# Patient Record
Sex: Female | Born: 1985 | Race: Black or African American | Hispanic: No | Marital: Single | Smoking: Former smoker
Health system: Southern US, Community
[De-identification: ages and names within clinical notes are randomized; demographics above are authoritative.]

## PROBLEM LIST (undated history)

## (undated) DIAGNOSIS — R51 Headache: Secondary | ICD-10-CM

## (undated) DIAGNOSIS — K8301 Primary sclerosing cholangitis: Secondary | ICD-10-CM

## (undated) DIAGNOSIS — D696 Thrombocytopenia, unspecified: Secondary | ICD-10-CM

## (undated) DIAGNOSIS — R569 Unspecified convulsions: Secondary | ICD-10-CM

## (undated) DIAGNOSIS — I639 Cerebral infarction, unspecified: Secondary | ICD-10-CM

## (undated) DIAGNOSIS — K7460 Unspecified cirrhosis of liver: Secondary | ICD-10-CM

## (undated) DIAGNOSIS — F329 Major depressive disorder, single episode, unspecified: Secondary | ICD-10-CM

## (undated) DIAGNOSIS — R011 Cardiac murmur, unspecified: Secondary | ICD-10-CM

## (undated) DIAGNOSIS — I8500 Esophageal varices without bleeding: Secondary | ICD-10-CM

## (undated) DIAGNOSIS — F1910 Other psychoactive substance abuse, uncomplicated: Secondary | ICD-10-CM

## (undated) DIAGNOSIS — Z9289 Personal history of other medical treatment: Secondary | ICD-10-CM

## (undated) DIAGNOSIS — K754 Autoimmune hepatitis: Secondary | ICD-10-CM

## (undated) DIAGNOSIS — F32A Depression, unspecified: Secondary | ICD-10-CM

## (undated) DIAGNOSIS — I629 Nontraumatic intracranial hemorrhage, unspecified: Secondary | ICD-10-CM

## (undated) DIAGNOSIS — D649 Anemia, unspecified: Secondary | ICD-10-CM

## (undated) DIAGNOSIS — F419 Anxiety disorder, unspecified: Secondary | ICD-10-CM

## (undated) HISTORY — DX: Other psychoactive substance abuse, uncomplicated: F19.10

## (undated) HISTORY — DX: Esophageal varices without bleeding: I85.00

## (undated) HISTORY — DX: Autoimmune hepatitis: K75.4

## (undated) SURGERY — EGD (ESOPHAGOGASTRODUODENOSCOPY)
Anesthesia: Moderate Sedation

---
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
## 2006-01-25 ENCOUNTER — Ambulatory Visit: Payer: Self-pay | Admitting: Sports Medicine

## 2006-03-30 ENCOUNTER — Ambulatory Visit: Payer: Self-pay | Admitting: Family Medicine

## 2006-07-04 ENCOUNTER — Emergency Department (HOSPITAL_COMMUNITY): Admission: EM | Admit: 2006-07-04 | Discharge: 2006-07-04 | Payer: Self-pay | Admitting: Emergency Medicine

## 2006-09-27 ENCOUNTER — Emergency Department (HOSPITAL_COMMUNITY): Admission: EM | Admit: 2006-09-27 | Discharge: 2006-09-27 | Payer: Self-pay | Admitting: Emergency Medicine

## 2006-10-06 DIAGNOSIS — D696 Thrombocytopenia, unspecified: Secondary | ICD-10-CM

## 2006-10-06 DIAGNOSIS — IMO0002 Reserved for concepts with insufficient information to code with codable children: Secondary | ICD-10-CM | POA: Insufficient documentation

## 2006-10-06 DIAGNOSIS — K7290 Hepatic failure, unspecified without coma: Secondary | ICD-10-CM

## 2006-10-06 DIAGNOSIS — R8789 Other abnormal findings in specimens from female genital organs: Secondary | ICD-10-CM

## 2006-10-06 DIAGNOSIS — F17200 Nicotine dependence, unspecified, uncomplicated: Secondary | ICD-10-CM | POA: Insufficient documentation

## 2006-10-06 DIAGNOSIS — K7460 Unspecified cirrhosis of liver: Secondary | ICD-10-CM | POA: Insufficient documentation

## 2006-10-06 DIAGNOSIS — I8500 Esophageal varices without bleeding: Secondary | ICD-10-CM | POA: Insufficient documentation

## 2006-10-06 DIAGNOSIS — K759 Inflammatory liver disease, unspecified: Secondary | ICD-10-CM | POA: Insufficient documentation

## 2006-10-24 ENCOUNTER — Encounter: Payer: Self-pay | Admitting: Physician Assistant

## 2007-02-06 ENCOUNTER — Encounter (INDEPENDENT_AMBULATORY_CARE_PROVIDER_SITE_OTHER): Payer: Self-pay | Admitting: Family Medicine

## 2007-03-25 ENCOUNTER — Emergency Department (HOSPITAL_COMMUNITY): Admission: EM | Admit: 2007-03-25 | Discharge: 2007-03-25 | Payer: Self-pay | Admitting: Emergency Medicine

## 2007-05-01 ENCOUNTER — Encounter (INDEPENDENT_AMBULATORY_CARE_PROVIDER_SITE_OTHER): Payer: Self-pay | Admitting: Family Medicine

## 2007-06-29 ENCOUNTER — Ambulatory Visit: Payer: Self-pay | Admitting: Family Medicine

## 2007-06-29 ENCOUNTER — Encounter (INDEPENDENT_AMBULATORY_CARE_PROVIDER_SITE_OTHER): Payer: Self-pay | Admitting: *Deleted

## 2007-06-29 DIAGNOSIS — R300 Dysuria: Secondary | ICD-10-CM

## 2007-06-29 DIAGNOSIS — N898 Other specified noninflammatory disorders of vagina: Secondary | ICD-10-CM | POA: Insufficient documentation

## 2007-06-29 DIAGNOSIS — N912 Amenorrhea, unspecified: Secondary | ICD-10-CM | POA: Insufficient documentation

## 2007-06-29 LAB — CONVERTED CEMR LAB
Blood in Urine, dipstick: NEGATIVE
Ketones, urine, test strip: NEGATIVE
Nitrite: NEGATIVE
Specific Gravity, Urine: 1.015
Whiff Test: NEGATIVE

## 2007-07-04 ENCOUNTER — Encounter: Payer: Self-pay | Admitting: *Deleted

## 2007-08-10 DIAGNOSIS — I639 Cerebral infarction, unspecified: Secondary | ICD-10-CM

## 2007-08-10 DIAGNOSIS — I629 Nontraumatic intracranial hemorrhage, unspecified: Secondary | ICD-10-CM

## 2007-08-10 HISTORY — DX: Nontraumatic intracranial hemorrhage, unspecified: I62.9

## 2007-08-10 HISTORY — DX: Cerebral infarction, unspecified: I63.9

## 2007-08-27 ENCOUNTER — Encounter (INDEPENDENT_AMBULATORY_CARE_PROVIDER_SITE_OTHER): Payer: Self-pay | Admitting: Family Medicine

## 2007-08-29 ENCOUNTER — Encounter (INDEPENDENT_AMBULATORY_CARE_PROVIDER_SITE_OTHER): Payer: Self-pay | Admitting: Family Medicine

## 2007-09-04 ENCOUNTER — Encounter: Payer: Self-pay | Admitting: Emergency Medicine

## 2007-09-05 ENCOUNTER — Ambulatory Visit: Payer: Self-pay | Admitting: Oncology

## 2007-09-05 ENCOUNTER — Inpatient Hospital Stay (HOSPITAL_COMMUNITY): Admission: EM | Admit: 2007-09-05 | Discharge: 2007-09-06 | Payer: Self-pay | Admitting: Neurosurgery

## 2007-09-06 ENCOUNTER — Encounter (INDEPENDENT_AMBULATORY_CARE_PROVIDER_SITE_OTHER): Payer: Self-pay | Admitting: Family Medicine

## 2007-09-07 ENCOUNTER — Encounter (INDEPENDENT_AMBULATORY_CARE_PROVIDER_SITE_OTHER): Payer: Self-pay | Admitting: Family Medicine

## 2007-09-08 ENCOUNTER — Encounter (INDEPENDENT_AMBULATORY_CARE_PROVIDER_SITE_OTHER): Payer: Self-pay | Admitting: Family Medicine

## 2007-09-09 ENCOUNTER — Encounter (INDEPENDENT_AMBULATORY_CARE_PROVIDER_SITE_OTHER): Payer: Self-pay | Admitting: Family Medicine

## 2007-09-10 ENCOUNTER — Encounter (INDEPENDENT_AMBULATORY_CARE_PROVIDER_SITE_OTHER): Payer: Self-pay | Admitting: Family Medicine

## 2007-09-12 ENCOUNTER — Encounter (INDEPENDENT_AMBULATORY_CARE_PROVIDER_SITE_OTHER): Payer: Self-pay | Admitting: Family Medicine

## 2007-09-12 ENCOUNTER — Telehealth (INDEPENDENT_AMBULATORY_CARE_PROVIDER_SITE_OTHER): Payer: Self-pay | Admitting: Family Medicine

## 2007-09-13 ENCOUNTER — Encounter (INDEPENDENT_AMBULATORY_CARE_PROVIDER_SITE_OTHER): Payer: Self-pay | Admitting: Family Medicine

## 2007-09-14 ENCOUNTER — Encounter (INDEPENDENT_AMBULATORY_CARE_PROVIDER_SITE_OTHER): Payer: Self-pay | Admitting: Family Medicine

## 2007-09-15 ENCOUNTER — Encounter (INDEPENDENT_AMBULATORY_CARE_PROVIDER_SITE_OTHER): Payer: Self-pay | Admitting: Family Medicine

## 2007-09-16 ENCOUNTER — Encounter (INDEPENDENT_AMBULATORY_CARE_PROVIDER_SITE_OTHER): Payer: Self-pay | Admitting: Family Medicine

## 2007-09-17 ENCOUNTER — Encounter (INDEPENDENT_AMBULATORY_CARE_PROVIDER_SITE_OTHER): Payer: Self-pay | Admitting: Family Medicine

## 2007-09-18 ENCOUNTER — Encounter (INDEPENDENT_AMBULATORY_CARE_PROVIDER_SITE_OTHER): Payer: Self-pay | Admitting: Family Medicine

## 2007-09-21 ENCOUNTER — Encounter (INDEPENDENT_AMBULATORY_CARE_PROVIDER_SITE_OTHER): Payer: Self-pay | Admitting: Family Medicine

## 2007-09-22 ENCOUNTER — Encounter (INDEPENDENT_AMBULATORY_CARE_PROVIDER_SITE_OTHER): Payer: Self-pay | Admitting: Family Medicine

## 2007-09-22 ENCOUNTER — Ambulatory Visit: Payer: Self-pay | Admitting: Oncology

## 2007-09-22 ENCOUNTER — Encounter: Payer: Self-pay | Admitting: Physician Assistant

## 2007-09-25 ENCOUNTER — Encounter (HOSPITAL_COMMUNITY): Admission: RE | Admit: 2007-09-25 | Discharge: 2007-12-24 | Payer: Self-pay | Admitting: Oncology

## 2007-09-25 LAB — CBC WITH DIFFERENTIAL/PLATELET
BASO%: 0.4 % (ref 0.0–2.0)
EOS%: 0.6 % (ref 0.0–7.0)
HCT: 34.4 % — ABNORMAL LOW (ref 34.8–46.6)
LYMPH%: 16.3 % (ref 14.0–48.0)
MCH: 36 pg — ABNORMAL HIGH (ref 26.0–34.0)
MCHC: 35.8 g/dL (ref 32.0–36.0)
NEUT%: 72.8 % (ref 39.6–76.8)
RBC: 3.42 10*6/uL — ABNORMAL LOW (ref 3.70–5.32)
WBC: 2.4 10*3/uL — ABNORMAL LOW (ref 3.9–10.0)
lymph#: 0.4 10*3/uL — ABNORMAL LOW (ref 0.9–3.3)

## 2007-09-25 LAB — COMPREHENSIVE METABOLIC PANEL
ALT: 123 U/L — ABNORMAL HIGH (ref 0–35)
AST: 167 U/L — ABNORMAL HIGH (ref 0–37)
Alkaline Phosphatase: 407 U/L — ABNORMAL HIGH (ref 39–117)
BUN: 12 mg/dL (ref 6–23)
Creatinine, Ser: 0.46 mg/dL (ref 0.40–1.20)

## 2007-09-25 LAB — HOLD TUBE, BLOOD BANK

## 2007-09-25 LAB — APTT: aPTT: 40 seconds — ABNORMAL HIGH (ref 24–37)

## 2007-09-27 ENCOUNTER — Encounter: Admission: RE | Admit: 2007-09-27 | Discharge: 2007-12-26 | Payer: Self-pay | Admitting: Student

## 2007-09-28 LAB — CBC WITH DIFFERENTIAL/PLATELET
BASO%: 0.9 % (ref 0.0–2.0)
Eosinophils Absolute: 0 10*3/uL (ref 0.0–0.5)
LYMPH%: 17.5 % (ref 14.0–48.0)
MCH: 36 pg — ABNORMAL HIGH (ref 26.0–34.0)
MCHC: 36 g/dL (ref 32.0–36.0)
MCV: 100 fL (ref 81.0–101.0)
MONO%: 12 % (ref 0.0–13.0)
Platelets: 16 10*3/uL — ABNORMAL LOW (ref 145–400)
RBC: 3.16 10*6/uL — ABNORMAL LOW (ref 3.70–5.32)

## 2007-10-05 LAB — CBC WITH DIFFERENTIAL/PLATELET
BASO%: 0.5 % (ref 0.0–2.0)
LYMPH%: 27.5 % (ref 14.0–48.0)
MCHC: 35.5 g/dL (ref 32.0–36.0)
MONO#: 0.2 10*3/uL (ref 0.1–0.9)
Platelets: 28 10*3/uL — ABNORMAL LOW (ref 145–400)
RBC: 3.51 10*6/uL — ABNORMAL LOW (ref 3.70–5.32)
WBC: 1.9 10*3/uL — ABNORMAL LOW (ref 3.9–10.0)

## 2007-10-12 LAB — CBC WITH DIFFERENTIAL/PLATELET
Basophils Absolute: 0 10*3/uL (ref 0.0–0.1)
Eosinophils Absolute: 0 10*3/uL (ref 0.0–0.5)
HCT: 32 % — ABNORMAL LOW (ref 34.8–46.6)
HGB: 11.5 g/dL — ABNORMAL LOW (ref 11.6–15.9)
LYMPH%: 28.4 % (ref 14.0–48.0)
MONO#: 0.2 10*3/uL (ref 0.1–0.9)
NEUT#: 1.2 10*3/uL — ABNORMAL LOW (ref 1.5–6.5)
NEUT%: 60.1 % (ref 39.6–76.8)
Platelets: 30 10*3/uL — ABNORMAL LOW (ref 145–400)
WBC: 2 10*3/uL — ABNORMAL LOW (ref 3.9–10.0)

## 2007-10-13 ENCOUNTER — Emergency Department (HOSPITAL_COMMUNITY): Admission: EM | Admit: 2007-10-13 | Discharge: 2007-10-13 | Payer: Self-pay | Admitting: Emergency Medicine

## 2007-10-19 LAB — CBC WITH DIFFERENTIAL/PLATELET
BASO%: 0.3 % (ref 0.0–2.0)
EOS%: 1.6 % (ref 0.0–7.0)
HCT: 33 % — ABNORMAL LOW (ref 34.8–46.6)
LYMPH%: 23.4 % (ref 14.0–48.0)
MCH: 36.5 pg — ABNORMAL HIGH (ref 26.0–34.0)
MCHC: 36.1 g/dL — ABNORMAL HIGH (ref 32.0–36.0)
MONO#: 0.2 10*3/uL (ref 0.1–0.9)
MONO%: 7.7 % (ref 0.0–13.0)
NEUT%: 67 % (ref 39.6–76.8)
Platelets: 37 10*3/uL — ABNORMAL LOW (ref 145–400)
RBC: 3.27 10*6/uL — ABNORMAL LOW (ref 3.70–5.32)
WBC: 2.4 10*3/uL — ABNORMAL LOW (ref 3.9–10.0)

## 2007-10-19 LAB — COMPREHENSIVE METABOLIC PANEL
ALT: 88 U/L — ABNORMAL HIGH (ref 0–35)
AST: 136 U/L — ABNORMAL HIGH (ref 0–37)
Alkaline Phosphatase: 403 U/L — ABNORMAL HIGH (ref 39–117)
CO2: 25 mEq/L (ref 19–32)
Creatinine, Ser: 0.45 mg/dL (ref 0.40–1.20)
Sodium: 138 mEq/L (ref 135–145)
Total Bilirubin: 4.3 mg/dL — ABNORMAL HIGH (ref 0.3–1.2)
Total Protein: 6.4 g/dL (ref 6.0–8.3)

## 2007-10-24 ENCOUNTER — Encounter: Payer: Self-pay | Admitting: Gastroenterology

## 2007-10-26 LAB — CBC WITH DIFFERENTIAL/PLATELET
BASO%: 0.5 % (ref 0.0–2.0)
HCT: 34.1 % — ABNORMAL LOW (ref 34.8–46.6)
LYMPH%: 30 % (ref 14.0–48.0)
MCH: 36.6 pg — ABNORMAL HIGH (ref 26.0–34.0)
MCHC: 35.8 g/dL (ref 32.0–36.0)
MCV: 102.2 fL — ABNORMAL HIGH (ref 81.0–101.0)
MONO#: 0.1 10*3/uL (ref 0.1–0.9)
MONO%: 6.7 % (ref 0.0–13.0)
NEUT%: 60.9 % (ref 39.6–76.8)
Platelets: 43 10*3/uL — ABNORMAL LOW (ref 145–400)
RBC: 3.33 10*6/uL — ABNORMAL LOW (ref 3.70–5.32)
WBC: 2.2 10*3/uL — ABNORMAL LOW (ref 3.9–10.0)

## 2007-11-07 ENCOUNTER — Ambulatory Visit: Payer: Self-pay | Admitting: Oncology

## 2007-11-16 LAB — COMPREHENSIVE METABOLIC PANEL
ALT: 61 U/L — ABNORMAL HIGH (ref 0–35)
AST: 96 U/L — ABNORMAL HIGH (ref 0–37)
Albumin: 3 g/dL — ABNORMAL LOW (ref 3.5–5.2)
Alkaline Phosphatase: 534 U/L — ABNORMAL HIGH (ref 39–117)
BUN: 14 mg/dL (ref 6–23)
CO2: 23 mEq/L (ref 19–32)
Calcium: 8.1 mg/dL — ABNORMAL LOW (ref 8.4–10.5)
Chloride: 110 mEq/L (ref 96–112)
Creatinine, Ser: 0.51 mg/dL (ref 0.40–1.20)
Glucose, Bld: 86 mg/dL (ref 70–99)
Potassium: 4.2 mEq/L (ref 3.5–5.3)
Sodium: 140 mEq/L (ref 135–145)
Total Bilirubin: 3.1 mg/dL — ABNORMAL HIGH (ref 0.3–1.2)
Total Protein: 6.4 g/dL (ref 6.0–8.3)

## 2007-11-16 LAB — CBC WITH DIFFERENTIAL/PLATELET
Basophils Absolute: 0 10*3/uL (ref 0.0–0.1)
EOS%: 5 % (ref 0.0–7.0)
HGB: 12.1 g/dL (ref 11.6–15.9)
LYMPH%: 27.9 % (ref 14.0–48.0)
MCH: 36.5 pg — ABNORMAL HIGH (ref 26.0–34.0)
MCV: 108 fL — ABNORMAL HIGH (ref 81.0–101.0)
MONO%: 8.3 % (ref 0.0–13.0)
NEUT%: 57.2 % (ref 39.6–76.8)
Platelets: 47 10*3/uL — ABNORMAL LOW (ref 145–400)
RDW: 15.1 % — ABNORMAL HIGH (ref 11.3–14.5)

## 2007-11-23 LAB — CBC WITH DIFFERENTIAL/PLATELET
Eosinophils Absolute: 0.1 10*3/uL (ref 0.0–0.5)
HCT: 35.5 % (ref 34.8–46.6)
HGB: 12.2 g/dL (ref 11.6–15.9)
LYMPH%: 22 % (ref 14.0–48.0)
MONO#: 0.3 10*3/uL (ref 0.1–0.9)
NEUT#: 1.8 10*3/uL (ref 1.5–6.5)
NEUT%: 63.8 % (ref 39.6–76.8)
Platelets: 42 10*3/uL — ABNORMAL LOW (ref 145–400)
WBC: 2.8 10*3/uL — ABNORMAL LOW (ref 3.9–10.0)

## 2007-11-30 LAB — CBC WITH DIFFERENTIAL/PLATELET
BASO%: 1.4 % (ref 0.0–2.0)
HCT: 40.5 % (ref 34.8–46.6)
LYMPH%: 23.2 % (ref 14.0–48.0)
MCH: 35.8 pg — ABNORMAL HIGH (ref 26.0–34.0)
MCHC: 34 g/dL (ref 32.0–36.0)
MONO#: 0.2 10*3/uL (ref 0.1–0.9)
NEUT%: 63.5 % (ref 39.6–76.8)
Platelets: 38 10*3/uL — ABNORMAL LOW (ref 145–400)
WBC: 2.3 10*3/uL — ABNORMAL LOW (ref 3.9–10.0)

## 2007-11-30 LAB — COMPREHENSIVE METABOLIC PANEL
ALT: 52 U/L — ABNORMAL HIGH (ref 0–35)
BUN: 12 mg/dL (ref 6–23)
CO2: 21 mEq/L (ref 19–32)
Creatinine, Ser: 0.53 mg/dL (ref 0.40–1.20)
Total Bilirubin: 3.5 mg/dL — ABNORMAL HIGH (ref 0.3–1.2)

## 2007-12-05 ENCOUNTER — Ambulatory Visit: Payer: Self-pay | Admitting: Family Medicine

## 2007-12-05 DIAGNOSIS — L738 Other specified follicular disorders: Secondary | ICD-10-CM | POA: Insufficient documentation

## 2007-12-05 DIAGNOSIS — I629 Nontraumatic intracranial hemorrhage, unspecified: Secondary | ICD-10-CM | POA: Insufficient documentation

## 2007-12-05 LAB — CONVERTED CEMR LAB: Beta hcg, urine, semiquantitative: NEGATIVE

## 2007-12-26 ENCOUNTER — Ambulatory Visit: Payer: Self-pay | Admitting: Oncology

## 2008-01-03 ENCOUNTER — Encounter (INDEPENDENT_AMBULATORY_CARE_PROVIDER_SITE_OTHER): Payer: Self-pay | Admitting: Family Medicine

## 2008-02-16 ENCOUNTER — Ambulatory Visit: Payer: Self-pay | Admitting: Oncology

## 2008-02-25 ENCOUNTER — Inpatient Hospital Stay (HOSPITAL_COMMUNITY): Admission: AC | Admit: 2008-02-25 | Discharge: 2008-02-28 | Payer: Self-pay

## 2008-02-25 ENCOUNTER — Ambulatory Visit: Payer: Self-pay | Admitting: Oncology

## 2008-02-25 ENCOUNTER — Ambulatory Visit: Payer: Self-pay | Admitting: Pulmonary Disease

## 2008-03-05 LAB — CBC WITH DIFFERENTIAL/PLATELET
Basophils Absolute: 0 10*3/uL (ref 0.0–0.1)
EOS%: 2.1 % (ref 0.0–7.0)
Eosinophils Absolute: 0.1 10*3/uL (ref 0.0–0.5)
HCT: 37.4 % (ref 34.8–46.6)
HGB: 13.3 g/dL (ref 11.6–15.9)
MCH: 36.6 pg — ABNORMAL HIGH (ref 26.0–34.0)
MCV: 103.2 fL — ABNORMAL HIGH (ref 81.0–101.0)
MONO%: 10.9 % (ref 0.0–13.0)
NEUT%: 68.6 % (ref 39.6–76.8)
lymph#: 0.5 10*3/uL — ABNORMAL LOW (ref 0.9–3.3)

## 2008-03-06 ENCOUNTER — Telehealth (INDEPENDENT_AMBULATORY_CARE_PROVIDER_SITE_OTHER): Payer: Self-pay | Admitting: Family Medicine

## 2008-03-29 ENCOUNTER — Ambulatory Visit: Payer: Self-pay | Admitting: Oncology

## 2008-04-02 LAB — CBC WITH DIFFERENTIAL/PLATELET
Basophils Absolute: 0 10*3/uL (ref 0.0–0.1)
HCT: 40.4 % (ref 34.8–46.6)
HGB: 14.3 g/dL (ref 11.6–15.9)
MONO#: 0.3 10*3/uL (ref 0.1–0.9)
NEUT#: 2.3 10*3/uL (ref 1.5–6.5)
NEUT%: 70.3 % (ref 39.6–76.8)
WBC: 3.2 10*3/uL — ABNORMAL LOW (ref 3.9–10.0)
lymph#: 0.5 10*3/uL — ABNORMAL LOW (ref 0.9–3.3)

## 2008-04-21 ENCOUNTER — Encounter (INDEPENDENT_AMBULATORY_CARE_PROVIDER_SITE_OTHER): Payer: Self-pay | Admitting: Family Medicine

## 2008-04-29 LAB — CBC WITH DIFFERENTIAL/PLATELET
Basophils Absolute: 0 10*3/uL (ref 0.0–0.1)
EOS%: 0 % (ref 0.0–7.0)
HCT: 40.9 % (ref 34.8–46.6)
HGB: 14.2 g/dL (ref 11.6–15.9)
MCH: 35.2 pg — ABNORMAL HIGH (ref 26.0–34.0)
MONO#: 0.2 10*3/uL (ref 0.1–0.9)
NEUT%: 77.8 % — ABNORMAL HIGH (ref 39.6–76.8)
lymph#: 0.7 10*3/uL — ABNORMAL LOW (ref 0.9–3.3)

## 2008-04-29 LAB — COMPREHENSIVE METABOLIC PANEL
BUN: 6 mg/dL (ref 6–23)
CO2: 24 mEq/L (ref 19–32)
Calcium: 8.2 mg/dL — ABNORMAL LOW (ref 8.4–10.5)
Chloride: 109 mEq/L (ref 96–112)
Creatinine, Ser: 0.55 mg/dL (ref 0.40–1.20)

## 2008-05-23 ENCOUNTER — Ambulatory Visit: Payer: Self-pay | Admitting: Oncology

## 2008-05-29 LAB — CBC WITH DIFFERENTIAL/PLATELET
BASO%: 0 % (ref 0.0–2.0)
Eosinophils Absolute: 0 10*3/uL (ref 0.0–0.5)
HCT: 38.5 % (ref 34.8–46.6)
HGB: 13.6 g/dL (ref 11.6–15.9)
MCHC: 35.2 g/dL (ref 32.0–36.0)
MONO#: 0.2 10*3/uL (ref 0.1–0.9)
NEUT#: 2.6 10*3/uL (ref 1.5–6.5)
NEUT%: 78.4 % — ABNORMAL HIGH (ref 39.6–76.8)
WBC: 3.3 10*3/uL — ABNORMAL LOW (ref 3.9–10.0)
lymph#: 0.5 10*3/uL — ABNORMAL LOW (ref 0.9–3.3)

## 2008-07-03 LAB — CBC WITH DIFFERENTIAL/PLATELET
BASO%: 1.5 % (ref 0.0–2.0)
Basophils Absolute: 0 10*3/uL (ref 0.0–0.1)
EOS%: 1.5 % (ref 0.0–7.0)
HGB: 13.2 g/dL (ref 11.6–15.9)
MCH: 33.5 pg (ref 26.0–34.0)
MCHC: 34.9 g/dL (ref 32.0–36.0)
MONO%: 6.5 % (ref 0.0–13.0)
RBC: 3.95 10*6/uL (ref 3.70–5.32)
RDW: 14.1 % (ref 11.3–14.5)
lymph#: 0.8 10*3/uL — ABNORMAL LOW (ref 0.9–3.3)

## 2008-07-11 ENCOUNTER — Ambulatory Visit: Payer: Self-pay | Admitting: Oncology

## 2008-07-18 LAB — CBC WITH DIFFERENTIAL/PLATELET
BASO%: 0.2 % (ref 0.0–2.0)
EOS%: 1.1 % (ref 0.0–7.0)
HCT: 38.5 % (ref 34.8–46.6)
MCH: 34.3 pg — ABNORMAL HIGH (ref 26.0–34.0)
MCHC: 35.2 g/dL (ref 32.0–36.0)
MCV: 97.6 fL (ref 81.0–101.0)
MONO%: 6.3 % (ref 0.0–13.0)
NEUT%: 70.9 % (ref 39.6–76.8)
lymph#: 0.7 10*3/uL — ABNORMAL LOW (ref 0.9–3.3)

## 2008-08-15 LAB — COMPREHENSIVE METABOLIC PANEL
ALT: 63 U/L — ABNORMAL HIGH (ref 0–35)
AST: 101 U/L — ABNORMAL HIGH (ref 0–37)
Albumin: 3.1 g/dL — ABNORMAL LOW (ref 3.5–5.2)
Calcium: 8.3 mg/dL — ABNORMAL LOW (ref 8.4–10.5)
Chloride: 107 mEq/L (ref 96–112)
Potassium: 4 mEq/L (ref 3.5–5.3)

## 2008-08-15 LAB — CBC WITH DIFFERENTIAL/PLATELET
BASO%: 0.3 % (ref 0.0–2.0)
EOS%: 0.8 % (ref 0.0–7.0)
MCH: 35 pg — ABNORMAL HIGH (ref 26.0–34.0)
MCHC: 35.4 g/dL (ref 32.0–36.0)
RBC: 4.03 10*6/uL (ref 3.70–5.32)
RDW: 17 % — ABNORMAL HIGH (ref 11.3–14.5)
lymph#: 0.6 10*3/uL — ABNORMAL LOW (ref 0.9–3.3)

## 2008-09-15 ENCOUNTER — Emergency Department (HOSPITAL_COMMUNITY): Admission: EM | Admit: 2008-09-15 | Discharge: 2008-09-15 | Payer: Self-pay | Admitting: Emergency Medicine

## 2008-09-17 ENCOUNTER — Telehealth (INDEPENDENT_AMBULATORY_CARE_PROVIDER_SITE_OTHER): Payer: Self-pay | Admitting: *Deleted

## 2008-09-17 ENCOUNTER — Ambulatory Visit: Payer: Self-pay | Admitting: Family Medicine

## 2008-09-17 ENCOUNTER — Encounter: Payer: Self-pay | Admitting: Family Medicine

## 2008-09-17 DIAGNOSIS — J029 Acute pharyngitis, unspecified: Secondary | ICD-10-CM | POA: Insufficient documentation

## 2008-09-17 LAB — CONVERTED CEMR LAB
Basophils Relative: 1 % (ref 0–1)
Eosinophils Absolute: 0 10*3/uL (ref 0.0–0.7)
Eosinophils Relative: 2 % (ref 0–5)
HCT: 37.3 % (ref 36.0–46.0)
Hemoglobin: 12.6 g/dL (ref 12.0–15.0)
MCHC: 33.8 g/dL (ref 30.0–36.0)
MCV: 96.9 fL (ref 78.0–100.0)
Monocytes Absolute: 0.3 10*3/uL (ref 0.1–1.0)
Monocytes Relative: 12 % (ref 3–12)
Neutrophils Relative %: 65 % (ref 43–77)
RBC: 3.85 M/uL — ABNORMAL LOW (ref 3.87–5.11)

## 2008-09-18 ENCOUNTER — Telehealth: Payer: Self-pay | Admitting: Family Medicine

## 2008-09-23 ENCOUNTER — Encounter (INDEPENDENT_AMBULATORY_CARE_PROVIDER_SITE_OTHER): Payer: Self-pay | Admitting: Family Medicine

## 2008-10-08 ENCOUNTER — Ambulatory Visit: Payer: Self-pay | Admitting: Oncology

## 2008-10-10 ENCOUNTER — Encounter (INDEPENDENT_AMBULATORY_CARE_PROVIDER_SITE_OTHER): Payer: Self-pay | Admitting: Family Medicine

## 2008-10-10 ENCOUNTER — Encounter: Payer: Self-pay | Admitting: Physician Assistant

## 2008-10-10 LAB — CBC WITH DIFFERENTIAL/PLATELET
BASO%: 0.3 % (ref 0.0–2.0)
LYMPH%: 27.6 % (ref 14.0–49.7)
MCHC: 35.7 g/dL (ref 31.5–36.0)
MCV: 92.8 fL (ref 79.5–101.0)
MONO#: 0.4 10*3/uL (ref 0.1–0.9)
MONO%: 11 % (ref 0.0–14.0)
Platelets: 62 10*3/uL — ABNORMAL LOW (ref 145–400)
RBC: 4.29 10*6/uL (ref 3.70–5.45)
WBC: 3.4 10*3/uL — ABNORMAL LOW (ref 3.9–10.3)
nRBC: 0 % (ref 0–0)

## 2008-11-04 ENCOUNTER — Encounter: Payer: Self-pay | Admitting: Physician Assistant

## 2008-12-10 ENCOUNTER — Ambulatory Visit: Payer: Self-pay | Admitting: Oncology

## 2008-12-12 LAB — COMPREHENSIVE METABOLIC PANEL
ALT: 56 U/L — ABNORMAL HIGH (ref 0–35)
CO2: 25 mEq/L (ref 19–32)
Potassium: 3.9 mEq/L (ref 3.5–5.3)
Sodium: 142 mEq/L (ref 135–145)
Total Bilirubin: 4.1 mg/dL — ABNORMAL HIGH (ref 0.3–1.2)
Total Protein: 5.8 g/dL — ABNORMAL LOW (ref 6.0–8.3)

## 2008-12-12 LAB — CBC WITH DIFFERENTIAL/PLATELET
BASO%: 0.2 % (ref 0.0–2.0)
LYMPH%: 20.9 % (ref 14.0–49.7)
MCHC: 34.5 g/dL (ref 31.5–36.0)
MONO#: 0.2 10*3/uL (ref 0.1–0.9)
Platelets: 64 10*3/uL — ABNORMAL LOW (ref 145–400)
RBC: 3.89 10*6/uL (ref 3.70–5.45)
RDW: 17.6 % — ABNORMAL HIGH (ref 11.2–14.5)
WBC: 3.2 10*3/uL — ABNORMAL LOW (ref 3.9–10.3)
lymph#: 0.7 10*3/uL — ABNORMAL LOW (ref 0.9–3.3)

## 2009-01-05 ENCOUNTER — Emergency Department (HOSPITAL_COMMUNITY): Admission: EM | Admit: 2009-01-05 | Discharge: 2009-01-06 | Payer: Self-pay | Admitting: Emergency Medicine

## 2009-01-16 ENCOUNTER — Encounter: Payer: Self-pay | Admitting: Gastroenterology

## 2009-02-12 ENCOUNTER — Ambulatory Visit: Payer: Self-pay | Admitting: Oncology

## 2009-03-14 ENCOUNTER — Ambulatory Visit: Payer: Self-pay | Admitting: Oncology

## 2009-03-15 ENCOUNTER — Inpatient Hospital Stay (HOSPITAL_COMMUNITY): Admission: EM | Admit: 2009-03-15 | Discharge: 2009-03-18 | Payer: Self-pay | Admitting: Emergency Medicine

## 2009-03-25 LAB — CBC WITH DIFFERENTIAL/PLATELET
BASO%: 0.3 % (ref 0.0–2.0)
EOS%: 0.9 % (ref 0.0–7.0)
LYMPH%: 18.4 % (ref 14.0–49.7)
MCHC: 34.6 g/dL (ref 31.5–36.0)
MCV: 106.8 fL — ABNORMAL HIGH (ref 79.5–101.0)
MONO%: 8 % (ref 0.0–14.0)
Platelets: 75 10*3/uL — ABNORMAL LOW (ref 145–400)
RBC: 3.28 10*6/uL — ABNORMAL LOW (ref 3.70–5.45)
RDW: 16.1 % — ABNORMAL HIGH (ref 11.2–14.5)

## 2009-04-16 ENCOUNTER — Ambulatory Visit: Payer: Self-pay | Admitting: Oncology

## 2009-04-22 ENCOUNTER — Encounter: Payer: Self-pay | Admitting: Family Medicine

## 2009-04-22 ENCOUNTER — Encounter: Payer: Self-pay | Admitting: Physician Assistant

## 2009-04-22 LAB — CBC WITH DIFFERENTIAL/PLATELET
Basophils Absolute: 0 10*3/uL (ref 0.0–0.1)
Eosinophils Absolute: 0 10*3/uL (ref 0.0–0.5)
HGB: 12.3 g/dL (ref 11.6–15.9)
MCV: 97.8 fL (ref 79.5–101.0)
MONO#: 0.2 10*3/uL (ref 0.1–0.9)
MONO%: 7.2 % (ref 0.0–14.0)
NEUT#: 2.2 10*3/uL (ref 1.5–6.5)
RBC: 3.57 10*6/uL — ABNORMAL LOW (ref 3.70–5.45)
RDW: 15.5 % — ABNORMAL HIGH (ref 11.2–14.5)
WBC: 2.9 10*3/uL — ABNORMAL LOW (ref 3.9–10.3)
nRBC: 0 % (ref 0–0)

## 2009-04-22 LAB — COMPREHENSIVE METABOLIC PANEL
ALT: 82 U/L — ABNORMAL HIGH (ref 0–35)
Albumin: 2.7 g/dL — ABNORMAL LOW (ref 3.5–5.2)
CO2: 27 mEq/L (ref 19–32)
Calcium: 8.4 mg/dL (ref 8.4–10.5)
Chloride: 106 mEq/L (ref 96–112)
Glucose, Bld: 125 mg/dL — ABNORMAL HIGH (ref 70–99)
Potassium: 3.4 mEq/L — ABNORMAL LOW (ref 3.5–5.3)
Sodium: 137 mEq/L (ref 135–145)
Total Bilirubin: 3.7 mg/dL — ABNORMAL HIGH (ref 0.3–1.2)
Total Protein: 6.1 g/dL (ref 6.0–8.3)

## 2009-05-30 ENCOUNTER — Ambulatory Visit: Payer: Self-pay | Admitting: Oncology

## 2009-06-03 LAB — CBC WITH DIFFERENTIAL/PLATELET
BASO%: 0.3 % (ref 0.0–2.0)
Basophils Absolute: 0 10*3/uL (ref 0.0–0.1)
EOS%: 1.6 % (ref 0.0–7.0)
HCT: 35.3 % (ref 34.8–46.6)
HGB: 12.1 g/dL (ref 11.6–15.9)
MCHC: 34.3 g/dL (ref 31.5–36.0)
MONO#: 0.2 10*3/uL (ref 0.1–0.9)
NEUT%: 75.9 % (ref 38.4–76.8)
RDW: 16.2 % — ABNORMAL HIGH (ref 11.2–14.5)
WBC: 3.1 10*3/uL — ABNORMAL LOW (ref 3.9–10.3)
lymph#: 0.5 10*3/uL — ABNORMAL LOW (ref 0.9–3.3)

## 2009-08-08 ENCOUNTER — Encounter: Payer: Self-pay | Admitting: Emergency Medicine

## 2009-08-09 ENCOUNTER — Encounter: Payer: Self-pay | Admitting: Family Medicine

## 2009-08-09 ENCOUNTER — Ambulatory Visit: Payer: Self-pay | Admitting: Family Medicine

## 2009-08-09 ENCOUNTER — Inpatient Hospital Stay (HOSPITAL_COMMUNITY): Admission: EM | Admit: 2009-08-09 | Discharge: 2009-08-11 | Payer: Self-pay | Admitting: Family Medicine

## 2009-08-11 ENCOUNTER — Encounter: Payer: Self-pay | Admitting: Physician Assistant

## 2009-08-13 ENCOUNTER — Ambulatory Visit: Payer: Self-pay | Admitting: Oncology

## 2009-08-22 ENCOUNTER — Encounter: Payer: Self-pay | Admitting: Physician Assistant

## 2009-08-22 LAB — CBC WITH DIFFERENTIAL/PLATELET
BASO%: 0.5 % (ref 0.0–2.0)
Eosinophils Absolute: 0.1 10*3/uL (ref 0.0–0.5)
LYMPH%: 12.4 % — ABNORMAL LOW (ref 14.0–49.7)
MCHC: 34.3 g/dL (ref 31.5–36.0)
MONO#: 0.2 10*3/uL (ref 0.1–0.9)
NEUT#: 3 10*3/uL (ref 1.5–6.5)
Platelets: 99 10*3/uL — ABNORMAL LOW (ref 145–400)
RBC: 3.5 10*6/uL — ABNORMAL LOW (ref 3.70–5.45)
RDW: 16.9 % — ABNORMAL HIGH (ref 11.2–14.5)
WBC: 3.8 10*3/uL — ABNORMAL LOW (ref 3.9–10.3)
lymph#: 0.5 10*3/uL — ABNORMAL LOW (ref 0.9–3.3)

## 2009-08-22 LAB — COMPREHENSIVE METABOLIC PANEL
ALT: 73 U/L — ABNORMAL HIGH (ref 0–35)
Albumin: 2.8 g/dL — ABNORMAL LOW (ref 3.5–5.2)
CO2: 21 mEq/L (ref 19–32)
Glucose, Bld: 112 mg/dL — ABNORMAL HIGH (ref 70–99)
Potassium: 3.7 mEq/L (ref 3.5–5.3)
Sodium: 139 mEq/L (ref 135–145)
Total Bilirubin: 5.9 mg/dL — ABNORMAL HIGH (ref 0.3–1.2)
Total Protein: 5.7 g/dL — ABNORMAL LOW (ref 6.0–8.3)

## 2009-08-26 IMAGING — CT CT HEAD W/O CM
3 of 6 series · 15 of 47 positions shown, 18 images · non-contrast
Comparison: None available

CT HEAD

CLINICAL DATA: Fall, history of brain aneurysm, seizure

CT HEAD WITHOUT CONTRAST
CT CERVICAL SPINE WITHOUT CONTRAST
TECHNIQUE: Multidetector CT imaging of the head and cervical spine
was performed following the standard protocol without intravenous
contrast.  Multiplanar CT image reconstructions of the cervical
spine were also generated.

[Series 602: cor · coronal · 0.38mm/px · 3 of 37 slices shown]
[im 10/37  brain]
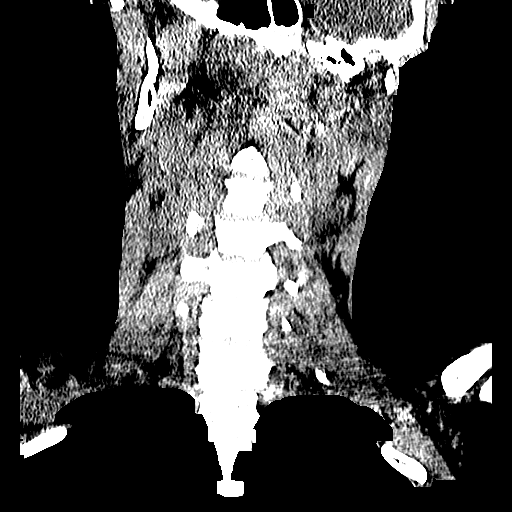
[im 19/37  brain]
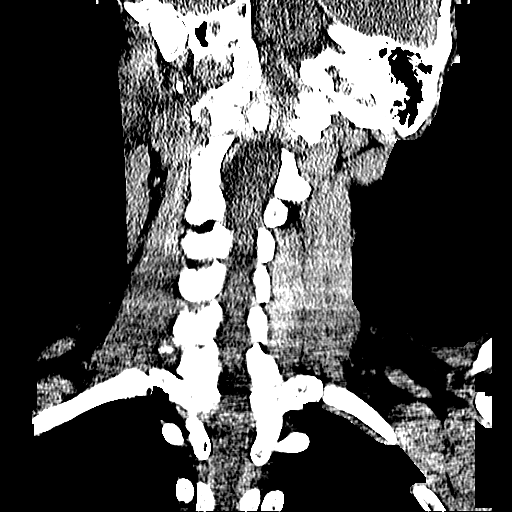
[im 28/37  brain]
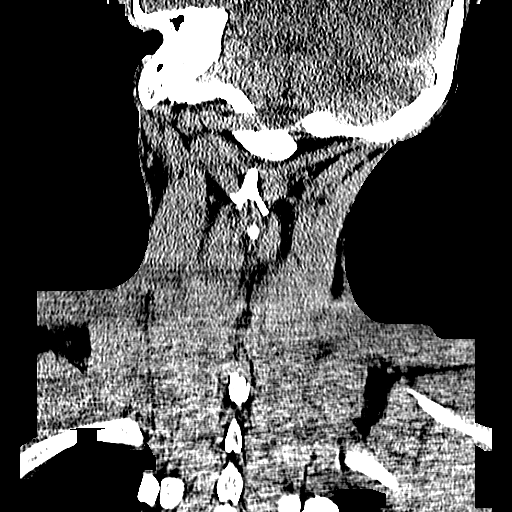

[Series 604: ax · axial · 0.38mm/px · z∈[-295,-165]mm · 9 of 83 slices shown, 12 images]
[im 9/83  brain]
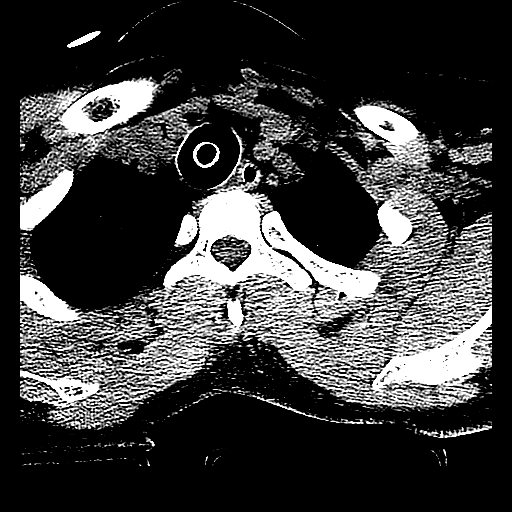
[im 9/83  bone]
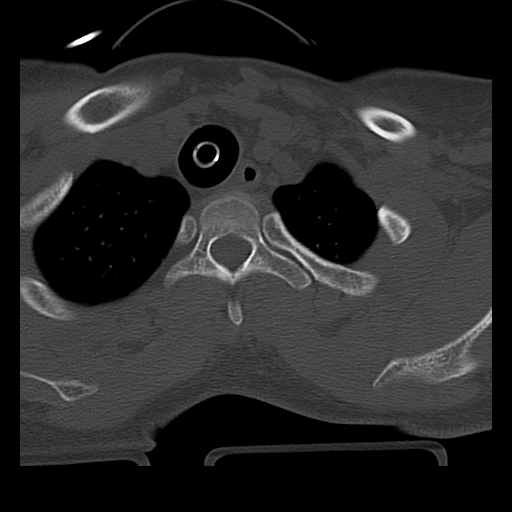
[im 17/83  brain]
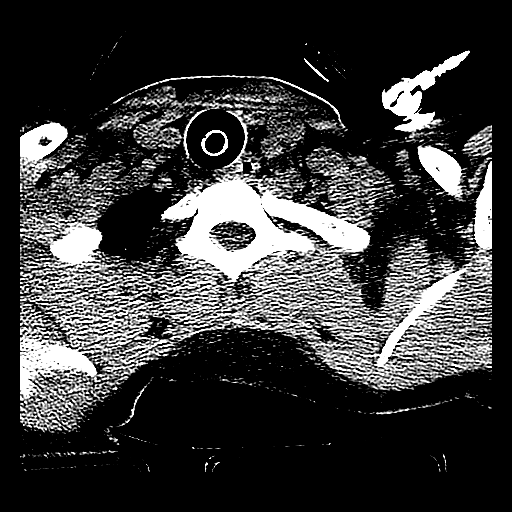
[im 25/83  brain]
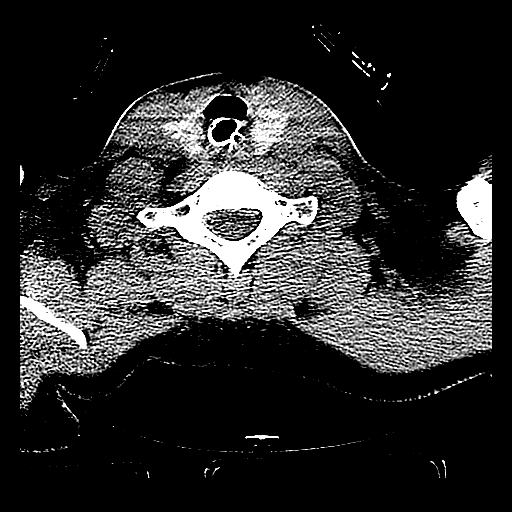
[im 33/83  brain]
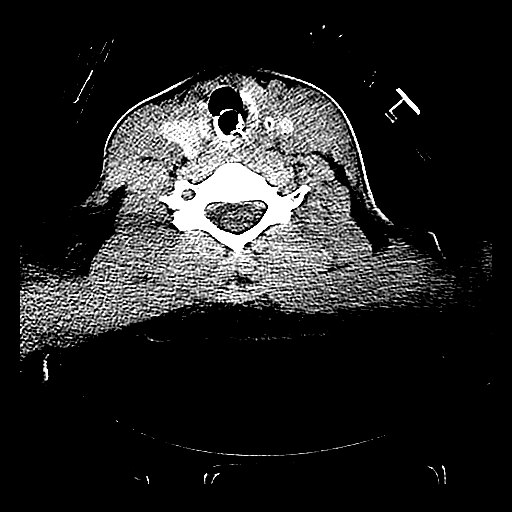
[im 42/83  brain]
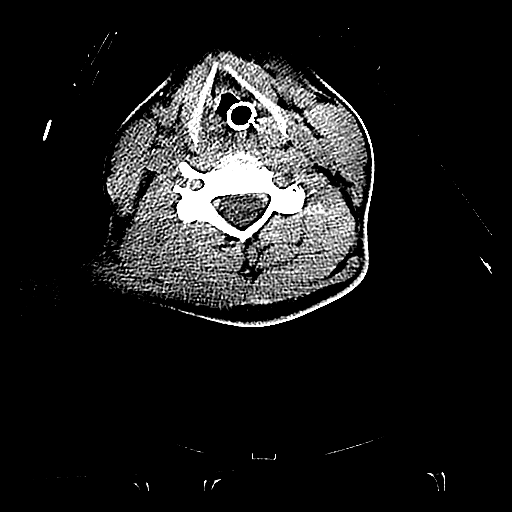
[im 42/83  bone]
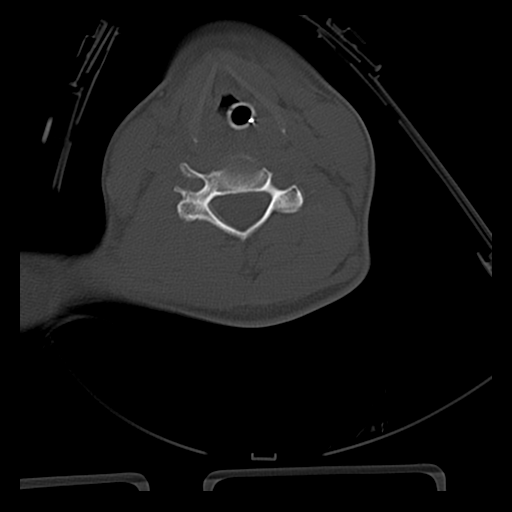
[im 50/83  brain]
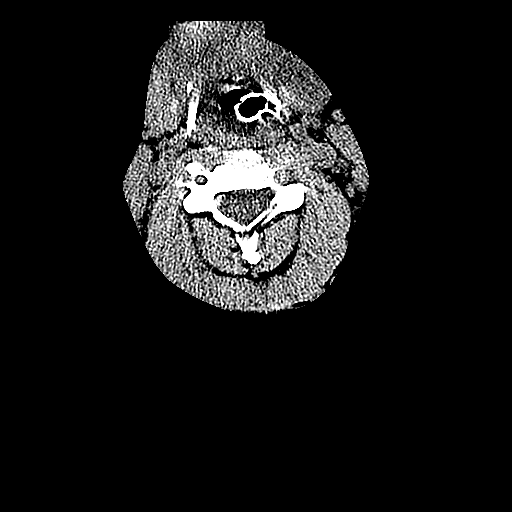
[im 58/83  brain]
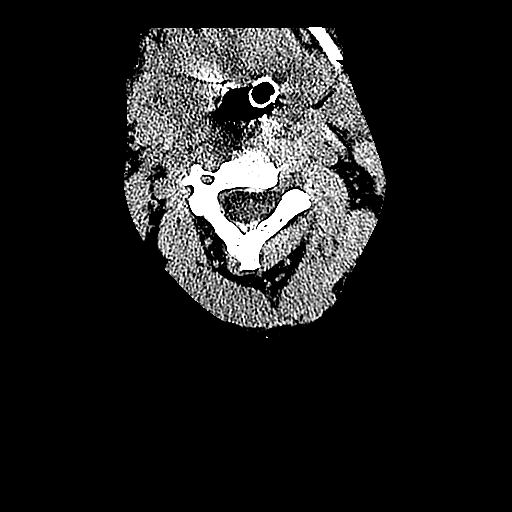
[im 66/83  brain]
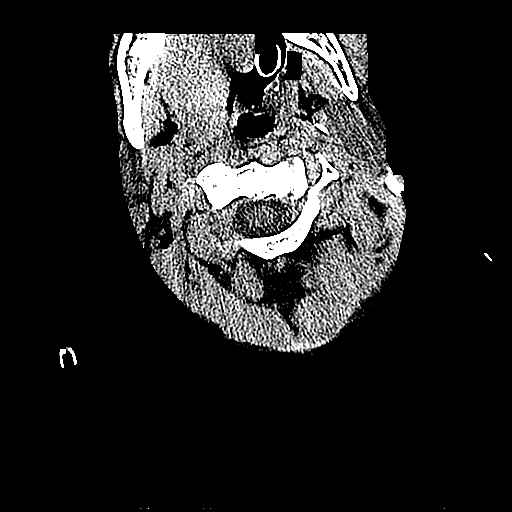
[im 74/83  brain]
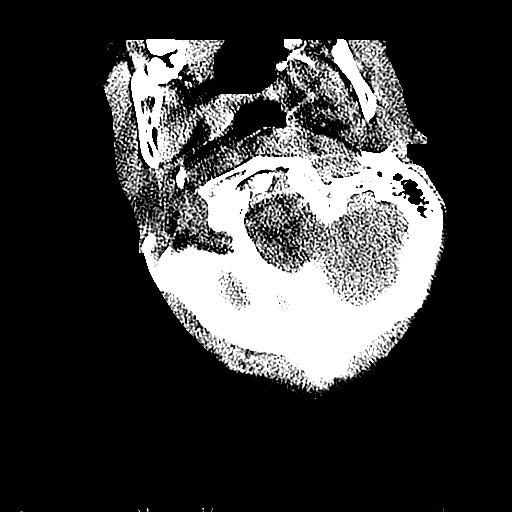
[im 74/83  bone]
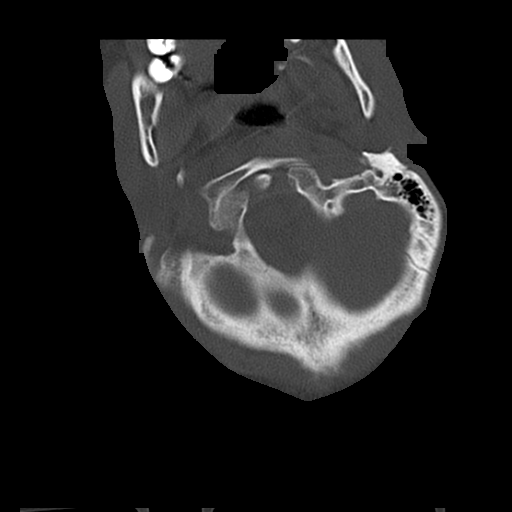

[Series 606: sag · sagittal · 0.38mm/px · 3 of 38 slices shown]
[im 13/38  brain]
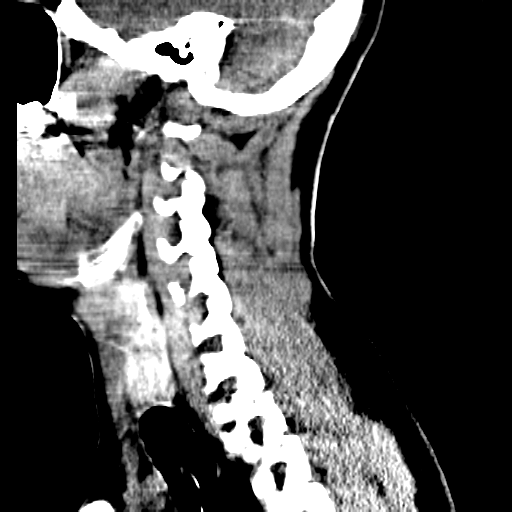
[im 19/38  brain]
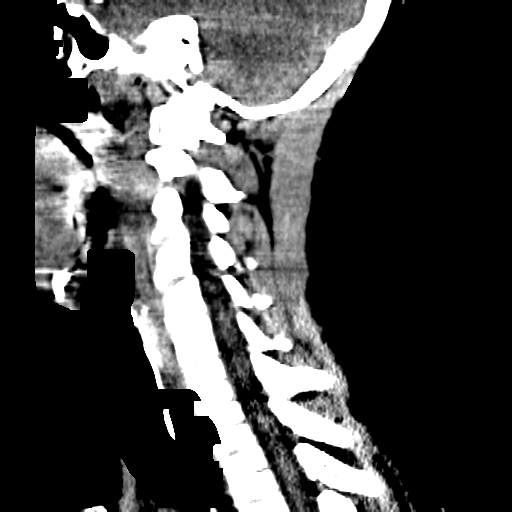
[im 25/38  brain]
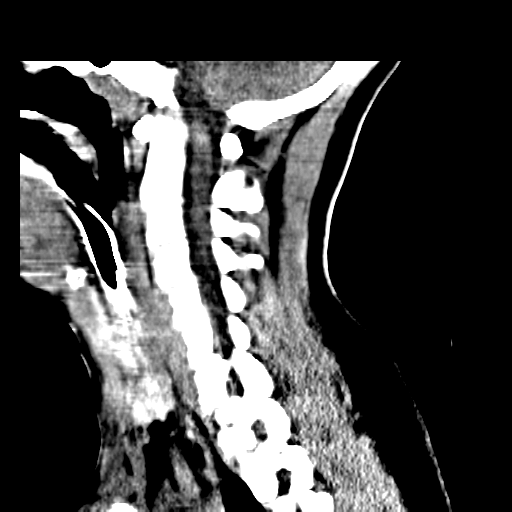

[15 of 47 positions shown; findings below may reference images not displayed]

FINDINGS: The study was performed with the patient on a backboard.
This contributes to reduced image quality.

There is an old left posterior cerebral artery territory infarct
with left occipital brain substance loss.  There is no mass effect
or hydrocephalous.  I see no definite acute infarct.  There is
increased density in the prepontine cistern on the right and
perhaps over the right temporal lobe laterally.  I cannot exclude
subtle acute subarachnoid blood, particularly with the history of
brain aneurysm.  If there is concern for acute subarachnoid
hemorrhage, formal angiography may be indicated.

There is a large left frontal scalp hematoma without underlying
skull fracture.  Sinuses and mastoids are clear.
IMPRESSION: Left frontal scalp hematoma without underlying skull fracture or
parenchymal hemorrhage

Cannot exclude subtle intracranial subarachnoid hemorrhage,
particularly in the prepontine cistern on the right, particularly
with the history of brain aneurysm; no further information is
available on this.

Old left PCA infarct

CT CERVICAL SPINE
FINDINGS: ET tube good position.  Anatomic cervical spine
alignment.  No visible fracture.  No intraspinal hematoma.
Vertebral bodies and disc spaces well preserved.  No neck masses
are seen. There is no prevertebral soft tissue swelling
IMPRESSION: No acute cervical spine injury is observed

## 2009-08-28 ENCOUNTER — Ambulatory Visit: Payer: Self-pay | Admitting: Obstetrics and Gynecology

## 2009-08-28 ENCOUNTER — Telehealth (INDEPENDENT_AMBULATORY_CARE_PROVIDER_SITE_OTHER): Payer: Self-pay | Admitting: *Deleted

## 2009-08-28 ENCOUNTER — Encounter: Payer: Self-pay | Admitting: Gastroenterology

## 2009-08-29 ENCOUNTER — Encounter: Payer: Self-pay | Admitting: Physician Assistant

## 2009-08-29 ENCOUNTER — Encounter: Payer: Self-pay | Admitting: Obstetrics and Gynecology

## 2009-08-29 LAB — CONVERTED CEMR LAB
Estradiol: 214.6 pg/mL
FSH: 5.3 milliintl units/mL
LH: 11.2 milliintl units/mL
Prolactin: 6.6 ng/mL

## 2009-09-02 ENCOUNTER — Ambulatory Visit (HOSPITAL_COMMUNITY): Admission: RE | Admit: 2009-09-02 | Discharge: 2009-09-02 | Payer: Self-pay | Admitting: Obstetrics & Gynecology

## 2009-09-18 ENCOUNTER — Ambulatory Visit: Payer: Self-pay | Admitting: Gastroenterology

## 2009-09-18 ENCOUNTER — Ambulatory Visit: Payer: Self-pay | Admitting: Oncology

## 2009-09-18 ENCOUNTER — Encounter: Payer: Self-pay | Admitting: Physician Assistant

## 2009-09-18 DIAGNOSIS — K766 Portal hypertension: Secondary | ICD-10-CM

## 2009-09-18 DIAGNOSIS — K7682 Hepatic encephalopathy: Secondary | ICD-10-CM | POA: Insufficient documentation

## 2009-09-18 DIAGNOSIS — K7290 Hepatic failure, unspecified without coma: Secondary | ICD-10-CM

## 2009-09-18 DIAGNOSIS — R1012 Left upper quadrant pain: Secondary | ICD-10-CM

## 2009-09-18 LAB — CONVERTED CEMR LAB
ALT: 54 units/L — ABNORMAL HIGH (ref 0–35)
Alkaline Phosphatase: 560 units/L — ABNORMAL HIGH (ref 39–117)
Basophils Relative: 1.6 % (ref 0.0–3.0)
Creatinine, Ser: 0.5 mg/dL (ref 0.4–1.2)
Eosinophils Relative: 1.7 % (ref 0.0–5.0)
HCT: 35.8 % — ABNORMAL LOW (ref 36.0–46.0)
INR: 1.3 — ABNORMAL HIGH (ref 0.8–1.0)
MCV: 109.4 fL — ABNORMAL HIGH (ref 78.0–100.0)
Monocytes Relative: 8.3 % (ref 3.0–12.0)
Neutrophils Relative %: 75.7 % (ref 43.0–77.0)
RBC: 3.27 M/uL — ABNORMAL LOW (ref 3.87–5.11)
Sodium: 138 meq/L (ref 135–145)
Total Bilirubin: 3.4 mg/dL — ABNORMAL HIGH (ref 0.3–1.2)
Total Protein: 5.9 g/dL — ABNORMAL LOW (ref 6.0–8.3)
WBC: 4.2 10*3/uL — ABNORMAL LOW (ref 4.5–10.5)

## 2009-09-18 LAB — CBC WITH DIFFERENTIAL/PLATELET
BASO%: 0.2 % (ref 0.0–2.0)
Basophils Absolute: 0 10e3/uL (ref 0.0–0.1)
EOS%: 1.1 % (ref 0.0–7.0)
Eosinophils Absolute: 0.1 10e3/uL (ref 0.0–0.5)
HCT: 34.2 % — ABNORMAL LOW (ref 34.8–46.6)
HGB: 11.9 g/dL (ref 11.6–15.9)
LYMPH%: 10.9 % — ABNORMAL LOW (ref 14.0–49.7)
MCH: 37.5 pg — ABNORMAL HIGH (ref 25.1–34.0)
MCHC: 34.7 g/dL (ref 31.5–36.0)
MCV: 107.8 fL — ABNORMAL HIGH (ref 79.5–101.0)
MONO#: 0.4 10e3/uL (ref 0.1–0.9)
MONO%: 8.3 % (ref 0.0–14.0)
NEUT#: 3.9 10e3/uL (ref 1.5–6.5)
NEUT%: 79.5 % — ABNORMAL HIGH (ref 38.4–76.8)
Platelets: 91 10e3/uL — ABNORMAL LOW (ref 145–400)
RBC: 3.17 10e6/uL — ABNORMAL LOW (ref 3.70–5.45)
RDW: 17.6 % — ABNORMAL HIGH (ref 11.2–14.5)
WBC: 4.9 10e3/uL (ref 3.9–10.3)
lymph#: 0.5 10e3/uL — ABNORMAL LOW (ref 0.9–3.3)

## 2009-09-18 LAB — COMPREHENSIVE METABOLIC PANEL
Albumin: 2.3 g/dL — ABNORMAL LOW (ref 3.5–5.2)
BUN: 6 mg/dL (ref 6–23)
CO2: 29 mEq/L (ref 19–32)
Calcium: 7.5 mg/dL — ABNORMAL LOW (ref 8.4–10.5)
Chloride: 102 mEq/L (ref 96–112)
Creatinine, Ser: 0.56 mg/dL (ref 0.40–1.20)
Glucose, Bld: 81 mg/dL (ref 70–99)

## 2009-09-21 ENCOUNTER — Emergency Department (HOSPITAL_COMMUNITY): Admission: EM | Admit: 2009-09-21 | Discharge: 2009-09-22 | Payer: Self-pay | Admitting: Emergency Medicine

## 2009-09-25 ENCOUNTER — Ambulatory Visit (HOSPITAL_COMMUNITY): Admission: RE | Admit: 2009-09-25 | Discharge: 2009-09-25 | Payer: Self-pay | Admitting: Gastroenterology

## 2009-10-24 ENCOUNTER — Ambulatory Visit: Payer: Self-pay | Admitting: Oncology

## 2009-10-27 ENCOUNTER — Encounter: Payer: Self-pay | Admitting: Family Medicine

## 2009-10-27 ENCOUNTER — Encounter: Payer: Self-pay | Admitting: Gastroenterology

## 2009-10-28 ENCOUNTER — Encounter: Payer: Self-pay | Admitting: Gastroenterology

## 2009-10-28 LAB — CBC WITH DIFFERENTIAL/PLATELET
Eosinophils Absolute: 0.1 10*3/uL (ref 0.0–0.5)
LYMPH%: 17.3 % (ref 14.0–49.7)
MONO#: 0.3 10*3/uL (ref 0.1–0.9)
NEUT#: 2.7 10*3/uL (ref 1.5–6.5)
Platelets: 69 10*3/uL — ABNORMAL LOW (ref 145–400)
RBC: 3.54 10*6/uL — ABNORMAL LOW (ref 3.70–5.45)
WBC: 3.8 10*3/uL — ABNORMAL LOW (ref 3.9–10.3)
nRBC: 0 % (ref 0–0)

## 2009-11-24 ENCOUNTER — Ambulatory Visit: Payer: Self-pay | Admitting: Gastroenterology

## 2009-11-24 LAB — CONVERTED CEMR LAB
BUN: 7 mg/dL (ref 6–23)
Basophils Absolute: 0 10*3/uL (ref 0.0–0.1)
Calcium: 8.1 mg/dL — ABNORMAL LOW (ref 8.4–10.5)
Eosinophils Relative: 1.4 % (ref 0.0–5.0)
GFR calc non Af Amer: 114.23 mL/min (ref >60)
Glucose, Bld: 98 mg/dL (ref 70–99)
HCT: 33.7 % — ABNORMAL LOW (ref 36.0–46.0)
INR: 1.4 — ABNORMAL HIGH (ref 0.8–1.0)
Lymphocytes Relative: 16.1 % (ref 12.0–46.0)
Lymphs Abs: 0.3 10*3/uL — ABNORMAL LOW (ref 0.7–4.0)
Monocytes Relative: 9 % (ref 3.0–12.0)
Neutrophils Relative %: 73 % (ref 43.0–77.0)
Platelets: 52 10*3/uL — ABNORMAL LOW (ref 150.0–400.0)
Potassium: 4.2 meq/L (ref 3.5–5.1)
Prothrombin Time: 14.6 s — ABNORMAL HIGH (ref 9.1–11.7)
RDW: 17.5 % — ABNORMAL HIGH (ref 11.5–14.6)
Sodium: 144 meq/L (ref 135–145)
WBC: 2 10*3/uL — ABNORMAL LOW (ref 4.5–10.5)
aPTT: 35.1 s — ABNORMAL HIGH (ref 21.7–28.8)

## 2010-01-01 ENCOUNTER — Telehealth: Payer: Self-pay | Admitting: Gastroenterology

## 2010-01-07 HISTORY — PX: ERCP: SHX60

## 2010-01-09 ENCOUNTER — Ambulatory Visit: Payer: Self-pay | Admitting: Gastroenterology

## 2010-01-09 ENCOUNTER — Ambulatory Visit (HOSPITAL_COMMUNITY): Admission: RE | Admit: 2010-01-09 | Discharge: 2010-01-10 | Payer: Self-pay | Admitting: Gastroenterology

## 2010-01-22 ENCOUNTER — Ambulatory Visit: Payer: Self-pay | Admitting: Oncology

## 2010-01-26 ENCOUNTER — Encounter: Payer: Self-pay | Admitting: Gastroenterology

## 2010-01-26 ENCOUNTER — Encounter: Payer: Self-pay | Admitting: Family Medicine

## 2010-03-08 ENCOUNTER — Emergency Department (HOSPITAL_COMMUNITY): Admission: EM | Admit: 2010-03-08 | Discharge: 2010-03-08 | Payer: Self-pay | Admitting: Emergency Medicine

## 2010-03-16 ENCOUNTER — Telehealth: Payer: Self-pay | Admitting: Gastroenterology

## 2010-03-25 ENCOUNTER — Emergency Department (HOSPITAL_COMMUNITY): Admission: EM | Admit: 2010-03-25 | Discharge: 2010-03-25 | Payer: Self-pay | Admitting: Internal Medicine

## 2010-04-01 ENCOUNTER — Ambulatory Visit: Payer: Self-pay | Admitting: Oncology

## 2010-04-03 LAB — CBC WITH DIFFERENTIAL/PLATELET
BASO%: 0.4 % (ref 0.0–2.0)
LYMPH%: 22.3 % (ref 14.0–49.7)
MCHC: 34.8 g/dL (ref 31.5–36.0)
MCV: 103.2 fL — ABNORMAL HIGH (ref 79.5–101.0)
MONO#: 0.2 10*3/uL (ref 0.1–0.9)
MONO%: 6.8 % (ref 0.0–14.0)
Platelets: 41 10*3/uL — ABNORMAL LOW (ref 145–400)
RBC: 3.48 10*6/uL — ABNORMAL LOW (ref 3.70–5.45)
RDW: 16.9 % — ABNORMAL HIGH (ref 11.2–14.5)
WBC: 2.7 10*3/uL — ABNORMAL LOW (ref 3.9–10.3)
nRBC: 0 % (ref 0–0)

## 2010-04-03 LAB — COMPREHENSIVE METABOLIC PANEL
CO2: 26 mEq/L (ref 19–32)
Creatinine, Ser: 0.51 mg/dL (ref 0.40–1.20)
Glucose, Bld: 109 mg/dL — ABNORMAL HIGH (ref 70–99)
Sodium: 139 mEq/L (ref 135–145)
Total Bilirubin: 4.1 mg/dL — ABNORMAL HIGH (ref 0.3–1.2)
Total Protein: 5.8 g/dL — ABNORMAL LOW (ref 6.0–8.3)

## 2010-04-07 ENCOUNTER — Encounter: Payer: Self-pay | Admitting: Gastroenterology

## 2010-04-21 ENCOUNTER — Encounter (INDEPENDENT_AMBULATORY_CARE_PROVIDER_SITE_OTHER): Payer: Self-pay | Admitting: *Deleted

## 2010-04-21 ENCOUNTER — Inpatient Hospital Stay (HOSPITAL_COMMUNITY): Admission: EM | Admit: 2010-04-21 | Discharge: 2010-04-25 | Payer: Self-pay | Admitting: Emergency Medicine

## 2010-04-21 ENCOUNTER — Ambulatory Visit: Payer: Self-pay | Admitting: Internal Medicine

## 2010-04-28 ENCOUNTER — Encounter (INDEPENDENT_AMBULATORY_CARE_PROVIDER_SITE_OTHER): Payer: Self-pay | Admitting: *Deleted

## 2010-04-28 ENCOUNTER — Emergency Department (HOSPITAL_COMMUNITY): Admission: EM | Admit: 2010-04-28 | Discharge: 2010-04-28 | Payer: Self-pay | Admitting: Emergency Medicine

## 2010-04-29 ENCOUNTER — Telehealth: Payer: Self-pay | Admitting: Gastroenterology

## 2010-05-08 ENCOUNTER — Emergency Department (HOSPITAL_COMMUNITY): Admission: EM | Admit: 2010-05-08 | Discharge: 2010-05-08 | Payer: Self-pay

## 2010-05-25 ENCOUNTER — Emergency Department (HOSPITAL_COMMUNITY): Admission: EM | Admit: 2010-05-25 | Discharge: 2010-05-25 | Payer: Self-pay | Admitting: Emergency Medicine

## 2010-06-26 ENCOUNTER — Ambulatory Visit: Payer: Self-pay | Admitting: Oncology

## 2010-06-30 ENCOUNTER — Encounter: Payer: Self-pay | Admitting: Gastroenterology

## 2010-06-30 LAB — CBC WITH DIFFERENTIAL/PLATELET
BASO%: 0.3 % (ref 0.0–2.0)
Basophils Absolute: 0 10*3/uL (ref 0.0–0.1)
HCT: 36.5 % (ref 34.8–46.6)
HGB: 12.7 g/dL (ref 11.6–15.9)
MONO#: 0.3 10*3/uL (ref 0.1–0.9)
NEUT%: 74.6 % (ref 38.4–76.8)
RDW: 15.4 % — ABNORMAL HIGH (ref 11.2–14.5)
WBC: 3.5 10*3/uL — ABNORMAL LOW (ref 3.9–10.3)
lymph#: 0.5 10*3/uL — ABNORMAL LOW (ref 0.9–3.3)

## 2010-06-30 LAB — COMPREHENSIVE METABOLIC PANEL
ALT: 60 U/L — ABNORMAL HIGH (ref 0–35)
AST: 132 U/L — ABNORMAL HIGH (ref 0–37)
Albumin: 2.5 g/dL — ABNORMAL LOW (ref 3.5–5.2)
CO2: 27 mEq/L (ref 19–32)
Calcium: 8.1 mg/dL — ABNORMAL LOW (ref 8.4–10.5)
Chloride: 106 mEq/L (ref 96–112)
Creatinine, Ser: 0.7 mg/dL (ref 0.40–1.20)
Potassium: 3.7 mEq/L (ref 3.5–5.3)

## 2010-07-29 ENCOUNTER — Encounter (INDEPENDENT_AMBULATORY_CARE_PROVIDER_SITE_OTHER): Payer: Self-pay | Admitting: *Deleted

## 2010-07-29 ENCOUNTER — Emergency Department (HOSPITAL_COMMUNITY)
Admission: EM | Admit: 2010-07-29 | Discharge: 2010-07-29 | Payer: Self-pay | Source: Home / Self Care | Admitting: Emergency Medicine

## 2010-08-08 ENCOUNTER — Encounter (INDEPENDENT_AMBULATORY_CARE_PROVIDER_SITE_OTHER): Payer: Self-pay | Admitting: *Deleted

## 2010-08-08 ENCOUNTER — Emergency Department (HOSPITAL_COMMUNITY)
Admission: EM | Admit: 2010-08-08 | Discharge: 2010-08-08 | Payer: Self-pay | Source: Home / Self Care | Admitting: Emergency Medicine

## 2010-08-19 ENCOUNTER — Ambulatory Visit: Admit: 2010-08-19 | Payer: Self-pay | Admitting: Gastroenterology

## 2010-08-26 ENCOUNTER — Encounter: Payer: Self-pay | Admitting: Family Medicine

## 2010-08-29 ENCOUNTER — Encounter: Payer: Self-pay | Admitting: Family Medicine

## 2010-09-10 NOTE — Letter (Signed)
Summary: [facility]  [facility]   Imported By: Lester Montgomery 09/29/2009 09:25:51  _____________________________________________________________________  External Attachment:    Type:   Image     Comment:   External Document

## 2010-09-10 NOTE — Consult Note (Signed)
Summary: [facility] Autoimmune Hepatitis  [facility]   Imported By: Clydell Hakim 02/11/2010 15:59:16  _____________________________________________________________________  External Attachment:    Type:   Image     Comment:   External Document

## 2010-09-10 NOTE — Progress Notes (Signed)
Summary: Iron rx refill request  ---- Converted from flag ---- ---- 03/16/2010 1:34 PM, Louis Meckel MD wrote: okay to give feosol 200mg  once daily   ---- 03/16/2010 1:28 PM, Merri Ray CMA (AAMA) wrote: Dr Arlyce Dice this pt's pharmacy is requesting Iron for pt, Not sure that you have prescribed it before. Do you want to prescribe? ------------------------------       New/Updated Medications: FEOSOL 200 (65 FE) MG TABS (FERROUS SULFATE DRIED) 1 by mouth once daily Prescriptions: FEOSOL 200 (65 FE) MG TABS (FERROUS SULFATE DRIED) 1 by mouth once daily  #30 x 1   Entered by:   Merri Ray CMA (AAMA)   Authorized by:   Louis Meckel MD   Signed by:   Merri Ray CMA (AAMA) on 03/16/2010   Method used:   Electronically to        [facility]. [phone]* (retail)       [address]       [place], [place]  [address]       Ph: [phone]       Fax: [phone]   RxID:   [id number]

## 2010-09-10 NOTE — Assessment & Plan Note (Signed)
Summary: CIRROSIS.Marland KitchenEM   History of Present Illness Visit Type: Follow-up Visit Primary GI MD: Melvia Heaps MD [facility] Primary Provider: Anselm Jungling. Piedad Climes, MD  Requesting Provider: Campbell Riches. Truett Perna, MD  Chief Complaint: cirrhosis History of Present Illness:   [name] has returned for followup of her cirrhosis.  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   Last endoscopy was in June, 2010 that demonstrated grade 1 varices.  She was seen in February, 2011 at this office for increasing lower extremity edema.  Her Lasix was increased.  At this time she feels fairly well except for fatigue.  Edema has improved.  [facility] recommended that she undergo ERCP since MRCP was limited in demonstrating a predominant stricture because of marked central regeneration of her liver.  This apparently was not done.  She is on the liver transplantation service.   GI Review of Systems    Reports nausea and  vomiting.      Denies abdominal pain, acid reflux, belching, bloating, chest pain, dysphagia with liquids, dysphagia with solids, heartburn, loss of appetite, vomiting blood, weight loss, and  weight gain.      Reports black tarry stools, change in bowel habits, constipation, diarrhea, rectal bleeding, and  rectal pain.     Denies anal fissure, diverticulosis, fecal incontinence, heme positive stool, hemorrhoids, irritable bowel syndrome, jaundice, light color stool, and  liver problems.    Current Medications (verified): 1)  Furosemide 20 Mg  Tabs (Furosemide) .[name].. 1 Tab By Mouth Daily Per [facility] 2)  Imuran 50 Mg  Tabs (Azathioprine) .... Per Un[facility] 3)  Omeprazole 40 Mg Cpdr (Omeprazole) .... One Tablet By Mouth Once Daily 4)  Nexium 40 Mg Pack (Esomeprazole Magnesium) .... As  Needed 5)  Klor-Con 20 Meq Pack (Potassium Chloride) .... One Tablet By Mouth Once Daily 6)  Lactulose 20 Gm/48ml Soln (Lactulose) .... As Directed 7)  Keppra 750 Mg Tabs (Levetiracetam) .... One Tablet By Mouth Two Times A Day 8)  Prometa(Dosage Unknown) .... One Tablet By Mouth Once Daily  Allergies (verified): 1)  ! Ibuprofen  Past History:  Past Medical History: Reviewed history from 09/18/2009 and no changes required. 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 Colposcopy CIN 2-3 10/06 - 12/17/2005, esoph varices banding 2003 - 08/16/2003  Family History: Reviewed history from 09/18/2009 and no changes required. n/c No FH of Colon Cancer:  Social History: Reviewed history from 10/06/2006 and no changes required. Lives w/mom, stepdad, 2 younger brothers (also stays w/grandma/grandpa in [place] a lot).   HSGraduate.  No etoh/drugs.  Smokes 1/2 ppd  Review of Systems       The patient complains of allergy/sinus, anemia, anxiety-new, arthritis/joint pain, blood in urine, change in vision, cough, depression-new, fatigue, headaches-new, heart murmur, menstrual pain, muscle pains/cramps, night sweats, shortness of breath, swelling of feet/legs, thirst - excessive, and urination - excessive.  The patient denies back pain, breast changes/lumps, confusion, coughing up blood, fainting, fever, hearing problems,  heart rhythm changes, itching, nosebleeds, pregnancy symptoms, skin rash, sleeping problems, sore throat, swollen lymph glands, thirst -  excessive , urination - excessive , urination changes/pain, urine leakage, vision changes, and voice change.    Vital Signs:  Patient profile:   25 year old female Height:      64 inches Weight:      150.50 pounds BMI:     25.93 Pulse rate:   80 / minute Pulse rhythm:   regular BP sitting:   120 / 60  (left arm) Cuff size:   regular  Vitals Entered By: June McMurray CMA Duncan Dull) (November 24, 2009 2:45 PM)  Physical Exam  Additional Exam:  She is a well-developed well-nourished female  Abdomen is without masses organomegaly spring-tipped is palpable there is 1+ edema the left lower extremity trace edema in the right lower extremity   Impression & Recommendations:  Problem # 1:  PORTAL HYPERTENSION (ICD-572.31) Patient is due for followup endoscopy in June, 2011  Problem # 2:  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  She will continue lactulose Orders: TLB-BMP (Basic Metabolic Panel-BMET) (80048-METABOL) TLB-CBC Platelet - w/Differential (85025-CBCD) TLB-PT (Protime) (85610-PTP) TLB-PTT (85730-PTTL)  Problem # 3:  HEPATITIS NOS (ICD-573.3) Question of primary sclerosing cholangitis  overlapping with autoimmune hepatitis  has been raised.  Recommendations for ERCP were made but this was not carried out.  Pt will eventually need a liver transplant.  Recommendations #1 I will  review [facility] records to be certain that this test was not done.  If not then I will schedule this since the patient wishes to have any testing done locally. #2 check INR, CBC and P. meds #3 continue Lasix  Patient Instructions: 1)  You will go to the basment for labs today 2)  We will obtain your procedure reports from [facility] 3)  The medication list was reviewed and reconciled.  All changed / newly prescribed medications were explained.  A complete medication list was provided to the patient / caregiver. 4)  Copy sent to : Brad Sherrill,MD 5)  cc Dr. Woodfin Ganja  Appended  Document: CIRROSIS.Marland KitchenEM Medical records reviewed.  No ERCP Schedule EGD and ERCP with propofol (done together)  Appended Document: CIRROSIS.Marland KitchenEM See phone note dated 01-01-10 for follow-up.

## 2010-09-10 NOTE — Assessment & Plan Note (Signed)
Summary: cirrhosis with increased edema/pl   History of Present Illness Visit Type: consult  Primary GI MD: Melvia Heaps MD [facility] Primary Provider: Anselm Jungling. Piedad Climes, MD  Requesting Provider: Campbell Riches. Truett Perna, MD  Chief Complaint: Cirrhosis with increased edema  History of Present Illness:   25 YO FEMALE NEW TO GI TODAY,REFERRED BY DR Truett Perna. UNFORTUNATELY [name] HAS MULTIPLE MEDICAL PROBLEMS. SHE WAS DX WITH AUTOIMMUNE HEPATITIS AT AGE 68, AND HAS END STAGE LIVER DISEASE. SHE IS KNOWN TO [facility] LIVER SERVICE -DR. DARLING. SHE HAS A MELD SCORE OF AROUND 11 BY HER RECORDS. SHE WAS SEEN BY DR. DARLING EARLIER THIS MONTH BUT FAMILY WANTS A GI. MD. LOCALLY AS WELL.PT REPORTS SOME DIFFICULTY WITH COMMUNICATION WITH DR. DARLING. PT HAS BEEN ON THE TRANSPLANT LIST. COMPLICATIONS OF HER CIRRHOSIS INCLUDE ,VARICEAL BLEEDING,EDEMA,MILD ENCEPHALOPATHY,AND SPLENOMEGALY. MRI FROM 2009 SHOWS PORTAL HYPERTENSION,SPLENOMEGALY AND LARGE CALIBER GASTRIC VARICES.. IT IS FELT SHE HAS SOME FEATURES OF PRIMARY SCLEROSING CHOLANGITIS AS WELL.  PT HAS HAD AN INRACRANIAL BLEED,HAS A SEIZURE DISORDER. 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 SHE IS BEING TREATED WITH PROMECTA. HER PRIMARY CONCERN TODAY IS RECENT INCREASE IN LOWER EXTREMITY EDEMA X 2 WEEKS. SHE ALSO FEELS HER ABDOMEN IS LARGER AND SHE IS HAVING DISCOMFORT IN HER LEFT SIDE. NO C/O DIFFUSE ABDOMINAL PAIN, NO FEVER,APPETITE IS FAIR. SHE FEELS SHE IS NOT URINATING AS MUCH AS SHE SHOULD. SHE HAS ALSO HAD RECENT GYN EVAL FOR IRREGULAR MENSES,SAYS SHE HAD VAGINAL BLEEDING FOR SEVERAL DAYS AFTER THE EXAM WHICH HAS NOW STOPPED.   GI Review of Systems    Reports abdominal pain and  bloating.     Location of  Abdominal pain: LUQ.    Denies acid reflux, belching, chest pain, dysphagia with liquids, dysphagia with solids, heartburn, loss of appetite, nausea, vomiting, vomiting blood, and  weight loss.     Reports jaundice and  liver problems.     Denies anal fissure, black tarry stools, change in bowel habit, constipation, diarrhea, diverticulosis, fecal incontinence, heme positive stool, hemorrhoids, irritable bowel syndrome, light color stool, rectal bleeding, and  rectal pain.    Current Medications (verified): 1)  Furosemide 20 Mg  Tabs (Furosemide) .[name].. 1 Tab By Mouth Daily Per Unc 2)  Imuran 50 Mg  Tabs (Azathioprine) .... Per Un[facility] 3)  Chloraseptic Mouth Pain 1.4 % Liqd (Phenol) .... Use Spray Three Times A Day As Needed Throat Pain 4)  Omeprazole 40 Mg Cpdr (Omeprazole) .... One Tablet By Mouth Once Daily 5)  Nexium 40 Mg Pack (Esomeprazole Magnesium) .... As Needed 6)  Klor-Con 20 Meq Pack (Potassium Chloride) .... One Tablet By Mouth Once Daily 7)  Lactulose 20 Gm/55ml Soln (Lactulose) .... As Directed 8)  Keppra 750 Mg Tabs (Levetiracetam) .... One Tablet By Mouth Two Times A Day 9)  Prometa(Dosage Unknown) .... 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 Colposcopy CIN 2-3 10/06 - 12/17/2005, esoph varices banding 2003 - 08/16/2003  Family History: n/c No FH of  Colon Cancer:  Social History: Reviewed history from 10/06/2006 and no changes required. Lives w/mom, stepdad, 2 younger brothers (also stays w/grandma/grandpa in [place] a lot).   HSGraduate.  No etoh/drugs.  Smokes 1/2 ppd  Review of Systems       The  patient complains of weight gain, peripheral edema, headaches, abdominal pain, and abnormal bleeding.  The patient denies anorexia, fever, weight loss, vision loss, decreased hearing, hoarseness, chest pain, syncope, dyspnea on exertion, prolonged cough, hemoptysis, melena, hematochezia, severe indigestion/heartburn, hematuria, incontinence, genital sores, muscle weakness, suspicious skin lesions, transient blindness, difficulty walking, depression, unusual weight change, enlarged lymph nodes, angioedema, breast masses, and testicular masses.         OTHERWISE AS IN HPI  Vital Signs:  Patient profile:   25 year old female Height:      64 inches Weight:      162 pounds BMI:     27.91 BSA:     1.79 Pulse rate:   76 / minute Pulse rhythm:   regular BP sitting:   132 / 80  (left arm) Cuff size:   regular  Vitals Entered By: Ok Anis CMA (September 18, 2009 3:43 PM)  Physical Exam  General:  Well developed, CHRONICALLY ILL APPEARING Head:  Normocephalic and atraumatic. Eyes:  EARLY ICTERUS Neck:  Supple; no masses or thyromegaly. Lungs:  Clear throughout to auscultation. Heart:  Regular rate and rhythm; no murmurs, rubs,  or bruits. Abdomen:  SOFT, TENDER LEFT ABDOMEN WITH PALPABLE SPLEEN DOWN 5 FB LEFT COSTAL MARGIN.NO APPRECIABLE FLUID WAVE, BS+ Rectal:  NOT DONE Extremities:  2 + EDEMA BILATERAL LE TO KNEE Neurologic:  Alert and  oriented x4;  grossly normal neurologically.,NO ASTERIXIS Skin:  SLIGHT JAUNDICE,PT HAS DARK SKIN Psych:  Alert and cooperative. Normal mood and affect.depressed affect.     Impression & Recommendations:  Problem # 1:  HEPATIC CIRRHOSIS, NONALCOHOLIC (ICD-571.5) Assessment Deteriorated 25 YO FEMALE WITH AUTOIMMNE LIVER DISEASE/POSSIBLE OVERLAP OF PRIMARY BILIARY CIRRHOSIS WITH END STAGE LIVER DISEASE. FOLLOWED AT [facility]-DR DARLING AND ON TRANSPLANT LIST WITH MELD SCORE AROUND  11. COMPLICATIONS:  SPLENOMEGALY THROMBOCYTOPENIA-ON PROECTA PER DR  SHERRILL HX OF VARICEAL BLEEDING-LAST EGD 2009,HAS GASTRIC VARICES ON MRI HX OF INTRACRANIAL BLEED 2009 SEIZURE DISORDER HX OF ENCEPHALOPATHY LOWER EXTREMITY EDEMA-INCREASED  PLAN;LABS AS BELOW INCREASE LASIX TO 40 MG EACH AM,AND 20 MG IN PM ONCE LABS REVIEWED WILL DETERMINE ADDITION OF ALDACTONE SCHEDULE FOR UPPER ABDOMNAL US-ASSESS FOR ASCITES,SPLENIC SIZE WEIGH DAILY STRICT 2 GRAM SODIUM DIET REINFORCED RETURN OFFICE VISIT WITH DR. KAPLAN IN 2 WEEKS I DISCUSSED  THE IMPORTANCE TO CONTINUE HER REGUALR FOLLOW UP WITH DR Piedad Climes AT [facility], IN ORDER TO STAY ACTIVE ON THE TRANSPLANT LIST.I TOLD HER WE DO NOT DO LIVER TRANSPLANTS IN ,AND HER BEST CHANCE FOR A TRANSPLANT IS TO STAY IN CLOSE CONTACT WITH [facility]. I TOLD HER WE COULD HELP CARE FOR SOME OR NEEDS LOCALLY,BUT NOT ALL OF THEM. SHE SEEMS FRUSTRATED WITH DR. DARLING,BUT DOESNT ELABORATE. Orders: TLB-CMP (Comprehensive Metabolic Pnl) (80053-COMP) TLB-Ammonia (82140-AMON) TLB-PT (Protime) (85610-PTP) TLB-CBC Platelet - w/Differential (85025-CBCD) T-Alpha-Fetoprotein Serum ([id number]) Ultrasound Abdomen (UAS)  Patient Instructions: 1)  [place] scheduled tomorrow 09-19-09 at [facility]. Directions provided. 2)  Increase to 40 MG in the AM. 20 MG at night.  3)  Weigh  yourself daily and log it. 4)  2 Gram Sodium diet. 5)  Copy sent to : Dr. Rolm Baptise 6)  The medication list was reviewed and reconciled.  All changed /  newly prescribed medications were explained.  A complete medication list was provided to the patient / caregiver.

## 2010-09-10 NOTE — Letter (Signed)
Summary: [facility]  [facility]   Imported By: Lennie Odor 04/24/2010 15:35:55  _____________________________________________________________________  External Attachment:    Type:   Image     Comment:   External Document

## 2010-09-10 NOTE — Letter (Signed)
[facility]   Imported By: Lester Augusta 09/29/2009 09:29:15  _____________________________________________________________________  External Attachment:    Type:   Image     Comment:   External Document

## 2010-09-10 NOTE — Procedures (Signed)
Summary: Upper Endoscopy  Patient: [name] Note: All result statuses are Final unless otherwise noted.  Tests: (1) Upper Endoscopy (EGD)   EGD Upper Endoscopy       DONE (C)     [facility]     [address] [place], [place]  [address]           ENDOSCOPY PROCEDURE REPORT           PATIENT:  [name], [name]  MR#:  [id number]     BIRTHDATE:  1985-08-12, 24 yrs. old  GENDER:  female           ENDOSCOPIST:  Barbette Hair. Arlyce Dice, MD     Referred by:           PROCEDURE DATE:  01/09/2010     PROCEDURE:  EGD, diagnostic     ASA CLASS:  Class III     INDICATIONS:  Known esophageal varices, for surveillance exam.           MEDICATIONS:   MAC sedation, administered by CRNA     TOPICAL ANESTHETIC:           DESCRIPTION OF PROCEDURE:   After the risks benefits and     alternatives of the procedure were thoroughly explained, informed     consent was obtained.  The  endoscope was introduced through the     mouth and advanced to the third portion of the duodenum, without     limitations.  The instrument was slowly withdrawn as the mucosa     was fully examined.           Grade I varices were found in the distal esophagus.  Exam was     otherwise normal.    Retroflexed views revealed no abnormalities.     The scope was then withdrawn from the patient and the procedure     completed.           COMPLICATIONS:  None           ENDOSCOPIC IMPRESSION:     1) Grade I varices in the distal esophagus     2) Otherwise normal exam.     RECOMMENDATIONS:f/u EGD 1 year           REPEAT EXAM:  In 1 year(s) for EGD.           ______________________________     Barbette Hair. Arlyce Dice, MD           CC:  Woodfin Ganja MD, Arnoldo Lenis, MD           n.     REVISED:  01/09/2010 01:59 PM     eSIGNED:   Barbette Hair. Ewel Lona at 01/09/2010 01:59 PM           Elisha Ponder, [id number]  Note: An exclamation mark (!) indicates a result that was not dispersed into the flowsheet. Document  Creation Date: 01/09/2010 2:00 PM _______________________________________________________________________  (1) Order result status: Final Collection or observation date-time: 01/09/2010 13:15 Requested date-time:  Receipt date-time:  Reported date-time:  Referring Physician:   Ordering Physician: Melvia Heaps [phone]) Specimen Source:  Source: Launa Grill Order Number: [id number] Lab site:   Appended Document: Upper Endoscopy Recall is in IDX for 01/2011.

## 2010-09-10 NOTE — Progress Notes (Signed)
Summary: Sooner Appt.  Phone Note From Other Clinic Call back at [phone]   Caller: Darl Pikes from [facility] Call For: Dr. Arlyce Dice Summary of Call: patient discharged from the hospital and needs a sooner appt than 10-3 with Dr. Arlyce Dice for abdominal pain and acites due to cirrhosis. Darl Pikes will fax the last couple of office notes to Dr. Arlyce Dice and the discharge is in [place] Initial call taken by: Harlow Mares CMA Duncan Dull),  April 29, 2010 10:00 AM  Follow-up for Phone Call        Darl Pikes will advise pt. that she will see Mike Gip [facility] on 05-04-10 at 10:30am.  Follow-up by: Laureen Ochs LPN,  April 29, 2010 10:16 AM     Appended Document: Sooner Appt. Pt. was a no show for appt. today. I left a message for Darl Pikes at the [facility] to advise her.

## 2010-09-10 NOTE — Letter (Signed)
Summary: [facility]   Imported By: Lester Poole 09/29/2009 09:21:25  _____________________________________________________________________  External Attachment:    Type:   Image     Comment:   External Document

## 2010-09-10 NOTE — Progress Notes (Signed)
Summary: APPT  ---- Converted from flag ---- ---- 08/28/2009 12:46 PM, Rachael Fee MD wrote: she needs ngi appt with me in next 2-3 weeks for cirrhosis, ------------------------------

## 2010-09-10 NOTE — Procedures (Signed)
Summary: ERCP  Patient: [name] Note: All result statuses are Final unless otherwise noted.  Tests: (1) ERCP (ERC)   ERC ERCP                  DONE (C)     [facility]     [address] [place], [place]  [address]           ERCP PROCEDURE REPORT           PATIENT:  Terri, Rojas  MR#:  [id number]     BIRTHDATE:  1985/10/21  GENDER:  female           ENDOSCOPIST:  Barbette Hair. Arlyce Dice, MD     ASSISTANT:           PROCEDURE DATE:  01/09/2010     PROCEDURE:  ERCP           INDICATIONS:  sclerosing cholangitis           MEDICATIONS:   MAC sedation, administered by CRNA, MAC sedation,     administered by CRNA cipro 400mg  IV     TOPICAL ANESTHETIC:           DESCRIPTION OF PROCEDURE:   After the risks benefits and     alternatives of the procedure were thoroughly explained, informed     consent was obtained.  The  endoscope was introduced through the     mouth and advanced to the third portion of the duodenum.           The pancreatic duct was filled to the tail and appeared to be     normal. Care was taken not to overfill the ductal system.     Cannulation of the common bile duct was accomplished. The common     bile duct and intrahepatics were normal without filling defects,     strictures, or stones.  Beading was noted. "beading" of the     intrahepatic ducts with multiple areas of stricturing; moderate     dilitation of left main intrahepatic duct and right duct. There is     little contrast in 1 segment of the distal right duct despite     forceful injection suggesting a tight stricture.    The scope was     then completely withdrawn from the patient and the procedure     terminated.           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     No dominent     intra or extrahepatic stricture           RECOMMENDATIONS:     1) continue current medications       ______________________________     Barbette Hair. Arlyce Dice, MD           CC:  Lavada Mesi. Truett Perna, M.D., Woodfin Ganja, MD           n.     REVISED:  01/09/2010 01:58 PM     eSIGNED:   Barbette Hair. Gethsemane Fischler at 01/09/2010 01:58 PM           [name], [id number]  Note: An exclamation mark (!) indicates a result that was not dispersed into the flowsheet. Document Creation Date: 01/09/2010 1:59 PM _______________________________________________________________________  (1) Order result status: Final Collection or observation date-time: 01/09/2010 13:15  Requested date-time:  Receipt date-time:  Reported date-time:  Referring Physician:   Ordering Physician: Melvia Heaps [id number]) Specimen Source:  Source: Launa Grill Order Number: [id number] Lab site:

## 2010-09-10 NOTE — Letter (Signed)
Summary: [facility]   Imported By: Sherian Rein 03/09/2010 13:44:15  _____________________________________________________________________  External Attachment:    Type:   Image     Comment:   External Document

## 2010-09-10 NOTE — Letter (Signed)
Summary: New Patient letter  [facility]  [address] [place], [place] [address]   Phone: [phone]  Fax: [phone]       08/28/2009 MRN: [id number]  [name] [address] [place], [place]  [address]  Dear Ms. Lindie Spruce,  Welcome to the Gastroenterology Division at [facility].    You are scheduled to see Dr.  Christella Hartigan on 09/23/09 at 2:45pm on the 3rd floor at [facility], [address]  We ask that you try to arrive at our office 15 minutes prior to your appointment time to allow for check-in.  We would like you to complete the enclosed self-administered evaluation form prior to your visit and bring it with you on the day of your appointment.  We will review it with you.  Also, please bring a complete list of all your medications or, if you prefer, bring the medication bottles and we will list them.  Please bring your insurance card so that we may make a copy of it.  If your insurance requires a referral to see a specialist, please bring your referral form from your primary care physician.  Co-payments are due at the time of your visit and may be paid by cash, check or credit card.     Your office visit will consist of a consult with your physician (includes a physical exam), any laboratory testing he/she may order, scheduling of any necessary diagnostic testing (e.g. x-ray, ultrasound, CT-scan), and scheduling of a procedure (e.g. Endoscopy, Colonoscopy) if required.  Please allow enough time on your schedule to allow for any/all of these possibilities.    If you cannot keep your appointment, please call [phone] to cancel or reschedule prior to your appointment date.  This allows [place] the opportunity to schedule an appointment for another patient in need of care.  If you do not cancel or reschedule by 5 p.m. the business day prior to your appointment date, you will be charged a $50.00 late cancellation/no-show fee.    Thank you for choosing [facility] for your medical needs.  We appreciate the opportunity to care for you.  Please visit [place] at our website  to learn more about our practice.                     Sincerely,                                                             The Gastroenterology Division   Appended Document: New Patient letter letter mailed

## 2010-09-10 NOTE — [facility] (Signed)
[facility]   Im[facility] By: Lester Two Strike 09/29/2009 09:20:00  _____________________________________________________________________  External Attachment:    Type:   Image     Comment:   External Document

## 2010-09-10 NOTE — Letter (Signed)
Summary: [facility]   Imported By: Lester Chapin 09/29/2009 66:44:03  _____________________________________________________________________  External Attachment:    Type:   Image     Comment:   External Document

## 2010-09-10 NOTE — Consult Note (Signed)
Summary: [facility]   Imported By: Knox Royalty 09/03/2010 10:22:31  _____________________________________________________________________  External Attachment:    Type:   Image     Comment:   External Document

## 2010-09-10 NOTE — Letter (Signed)
[facility]   Imported By: Lester Star 09/29/2009 09:27:01  _____________________________________________________________________  External Attachment:    Type:   Image     Comment:   External Document

## 2010-09-10 NOTE — Consult Note (Signed)
Summary: [facility]: Truett Perna MD  Truett Perna MD   Imported By: Bradly Bienenstock 11/07/2009 16:06:26  _____________________________________________________________________  External Attachment:    Type:   Image     Comment:   External Document

## 2010-09-10 NOTE — Letter (Signed)
Summary: [facility]  [facility]   Imported By: Sherian Rein 07/17/2010 07:38:19  _____________________________________________________________________  External Attachment:    Type:   Image     Comment:   External Document

## 2010-09-10 NOTE — Procedures (Signed)
Summary: [facility]  [facility]   Imported By: Sherian Rein 01/08/2010 08:38:39  _____________________________________________________________________  External Attachment:    Type:   Image     Comment:   External Document

## 2010-09-10 NOTE — Letter (Signed)
Summary: [facility]  [facility]   Imported By: Lester Mayhill 09/29/2009 09:30:43  _____________________________________________________________________  External Attachment:    Type:   Image     Comment:   External Document

## 2010-09-10 NOTE — Letter (Signed)
[facility]   Imported By: Lester [place] 01/07/2010 10:07:23  _____________________________________________________________________  External Attachment:    Type:   Image     Comment:   External Document

## 2010-09-10 NOTE — Procedures (Signed)
Summary: [facility] Endoscopy/[facility]  [facility]oscopy/[facility]   Imported By: Sherian Rein 01/08/2010 08:34:45  _____________________________________________________________________  External Attachment:    Type:   Image     Comment:   External Document

## 2010-09-10 NOTE — Progress Notes (Signed)
Summary: ENDO/ERCP W/PROPOFOL SCHEDULED  Phone Note Outgoing Call   Call placed by: Laureen Ochs LPN,  Jan 01, 2010 3:04 PM Call placed to: Patient Summary of Call: Follow-up on OV 11-24-09--Per Dr.Kaplan, pt. needs an Endo/ERCP with Propofol. She is scheduled at [facility] on 01-09-10 at 11am.  Pt. states she will need to discuss this with her grandmother, she will call back tomorrow to confirm/cancel appt.  Initial call taken by: Laureen Ochs LPN,  Jan 01, 2010 3:09 PM  Follow-up for Phone Call        DR.KAPLAN--does she need antibiotics?  Laureen Ochs LPN  Jan 01, 2010 3:12 PM   Additional Follow-up for Phone Call Additional follow up Details #1::        cipro 400mg  IV Additional Follow-up by: Louis Meckel MD,  Jan 01, 2010 3:33 PM    Additional Follow-up for Phone Call Additional follow up Details #2::    Cipro order given to Clydie Braun at [facility].  Laureen Ochs LPN  Jan 01, 2010 3:43 PM   Message left for patient to callback and confirm procedure appt.  Laureen Ochs LPN  Jan 06, 2010 9:02 AM  Message left that pt. needs to call today for ERCP confirmation. Laureen Ochs LPN  January 08, 3243 11:06 AM  Per pt. and her grandmother, pt. will have procedures scheduled for 01-09-10. All instructions reviewed w/pt's grandmother by phone. Pt. instructed to call back as needed.  Follow-up by: Laureen Ochs LPN,  January 07, 101 3:52 PM   Appended Document: Endo/ERCP ordered    Clinical Lists Changes  Orders: Added new Test order of ERCP (ERCP) - Signed

## 2010-09-10 NOTE — Letter (Signed)
[facility]   Imported By: Lester Williams 09/29/2009 09:18:54  _____________________________________________________________________  External Attachment:    Type:   Image     Comment:   External Document

## 2010-09-16 ENCOUNTER — Ambulatory Visit: Payer: Self-pay | Admitting: Obstetrics & Gynecology

## 2010-10-19 LAB — URINALYSIS, ROUTINE W REFLEX MICROSCOPIC
Bilirubin Urine: NEGATIVE
Glucose, UA: NEGATIVE mg/dL
Hgb urine dipstick: NEGATIVE
Ketones, ur: 15 mg/dL — AB
Nitrite: NEGATIVE
Nitrite: NEGATIVE
Protein, ur: 100 mg/dL — AB
Protein, ur: NEGATIVE mg/dL
Specific Gravity, Urine: 1.023 (ref 1.005–1.030)
Urobilinogen, UA: 1 mg/dL (ref 0.0–1.0)
Urobilinogen, UA: 1 mg/dL (ref 0.0–1.0)
pH: 8.5 — ABNORMAL HIGH (ref 5.0–8.0)

## 2010-10-19 LAB — RAPID URINE DRUG SCREEN, HOSP PERFORMED
Amphetamines: NOT DETECTED
Barbiturates: NOT DETECTED
Benzodiazepines: NOT DETECTED
Cocaine: POSITIVE — AB
Opiates: POSITIVE — AB
Tetrahydrocannabinol: POSITIVE — AB

## 2010-10-19 LAB — CBC
HCT: 39.2 % (ref 36.0–46.0)
HCT: 39.7 % (ref 36.0–46.0)
Hemoglobin: 13.8 g/dL (ref 12.0–15.0)
Hemoglobin: 13.8 g/dL (ref 12.0–15.0)
MCH: 34.8 pg — ABNORMAL HIGH (ref 26.0–34.0)
MCHC: 34.8 g/dL (ref 30.0–36.0)
MCHC: 35.2 g/dL (ref 30.0–36.0)
MCV: 100.3 fL — ABNORMAL HIGH (ref 78.0–100.0)
MCV: 99.7 fL (ref 78.0–100.0)
Platelets: 118 10*3/uL — ABNORMAL LOW (ref 150–400)
RBC: 3.96 MIL/uL (ref 3.87–5.11)
RDW: 15.3 % (ref 11.5–15.5)
WBC: 5.3 10*3/uL (ref 4.0–10.5)

## 2010-10-19 LAB — DIFFERENTIAL
Basophils Absolute: 0 10*3/uL (ref 0.0–0.1)
Basophils Relative: 0 % (ref 0–1)
Basophils Relative: 0 % (ref 0–1)
Eosinophils Absolute: 0.1 10*3/uL (ref 0.0–0.7)
Eosinophils Absolute: 0.1 K/uL (ref 0.0–0.7)
Eosinophils Relative: 1 % (ref 0–5)
Lymphocytes Relative: 5 % — ABNORMAL LOW (ref 12–46)
Lymphs Abs: 0.3 10*3/uL — ABNORMAL LOW (ref 0.7–4.0)
Lymphs Abs: 0.3 K/uL — ABNORMAL LOW (ref 0.7–4.0)
Monocytes Absolute: 0.2 K/uL (ref 0.1–1.0)
Monocytes Relative: 4 % (ref 3–12)
Neutro Abs: 4.7 10*3/uL (ref 1.7–7.7)
Neutrophils Relative %: 86 % — ABNORMAL HIGH (ref 43–77)
Neutrophils Relative %: 90 % — ABNORMAL HIGH (ref 43–77)

## 2010-10-19 LAB — PROTIME-INR: Prothrombin Time: 17 seconds — ABNORMAL HIGH (ref 11.6–15.2)

## 2010-10-19 LAB — URINE MICROSCOPIC-ADD ON

## 2010-10-19 LAB — COMPREHENSIVE METABOLIC PANEL
ALT: 59 U/L — ABNORMAL HIGH (ref 0–35)
ALT: 60 U/L — ABNORMAL HIGH (ref 0–35)
AST: 131 U/L — ABNORMAL HIGH (ref 0–37)
Albumin: 2.7 g/dL — ABNORMAL LOW (ref 3.5–5.2)
Alkaline Phosphatase: 749 U/L — ABNORMAL HIGH (ref 39–117)
Alkaline Phosphatase: 795 U/L — ABNORMAL HIGH (ref 39–117)
CO2: 21 mEq/L (ref 19–32)
Calcium: 8.4 mg/dL (ref 8.4–10.5)
Calcium: 8.6 mg/dL (ref 8.4–10.5)
GFR calc Af Amer: >60 mL/min (ref >60)
GFR calc non Af Amer: >60 mL/min (ref >60)
Glucose, Bld: 80 mg/dL (ref 70–99)
Glucose, Bld: 85 mg/dL (ref 70–99)
Potassium: 3.6 mEq/L (ref 3.5–5.1)
Sodium: 139 mEq/L (ref 135–145)
Sodium: 141 mEq/L (ref 135–145)
Total Protein: 6.4 g/dL (ref 6.0–8.3)

## 2010-10-19 LAB — COMPREHENSIVE METABOLIC PANEL WITH GFR
BUN: 6 mg/dL (ref 6–23)
CO2: 23 meq/L (ref 19–32)
Chloride: 107 meq/L (ref 96–112)
Creatinine, Ser: 0.68 mg/dL (ref 0.4–1.2)
GFR calc non Af Amer: >60 mL/min (ref >60)
Total Bilirubin: 5 mg/dL — ABNORMAL HIGH (ref 0.3–1.2)

## 2010-10-19 LAB — PREGNANCY, URINE: Preg Test, Ur: NEGATIVE

## 2010-10-19 LAB — LIPASE, BLOOD
Lipase: 19 U/L (ref 11–59)
Lipase: 25 U/L (ref 11–59)

## 2010-10-21 LAB — DIFFERENTIAL
Basophils Absolute: 0 10*3/uL (ref 0.0–0.1)
Basophils Relative: 1 % (ref 0–1)
Lymphocytes Relative: 12 % (ref 12–46)
Monocytes Relative: 5 % (ref 3–12)
Neutro Abs: 2.4 10*3/uL (ref 1.7–7.7)

## 2010-10-21 LAB — URINALYSIS, ROUTINE W REFLEX MICROSCOPIC
Hgb urine dipstick: NEGATIVE
Protein, ur: 100 mg/dL — AB
Urobilinogen, UA: 1 mg/dL (ref 0.0–1.0)

## 2010-10-21 LAB — GASTRIC OCCULT BLOOD (1-CARD TO LAB)
Occult Blood, Gastric: POSITIVE — AB
pH, Gastric: 6

## 2010-10-21 LAB — CBC
MCH: 36.8 pg — ABNORMAL HIGH (ref 26.0–34.0)
MCV: 106.1 fL — ABNORMAL HIGH (ref 78.0–100.0)
Platelets: 87 10*3/uL — ABNORMAL LOW (ref 150–400)
RDW: 17.3 % — ABNORMAL HIGH (ref 11.5–15.5)

## 2010-10-21 LAB — POCT PREGNANCY, URINE: Preg Test, Ur: NEGATIVE

## 2010-10-21 LAB — URINE MICROSCOPIC-ADD ON

## 2010-10-21 LAB — PROTIME-INR
INR: 1.4 (ref 0.00–1.49)
Prothrombin Time: 17.4 seconds — ABNORMAL HIGH (ref 11.6–15.2)

## 2010-10-21 LAB — COMPREHENSIVE METABOLIC PANEL
Albumin: 2.6 g/dL — ABNORMAL LOW (ref 3.5–5.2)
BUN: 7 mg/dL (ref 6–23)
Creatinine, Ser: 0.67 mg/dL (ref 0.4–1.2)
Total Protein: 6.8 g/dL (ref 6.0–8.3)

## 2010-10-22 LAB — COMPREHENSIVE METABOLIC PANEL
ALT: 82 U/L — ABNORMAL HIGH (ref 0–35)
AST: 139 U/L — ABNORMAL HIGH (ref 0–37)
AST: 143 U/L — ABNORMAL HIGH (ref 0–37)
Alkaline Phosphatase: 504 U/L — ABNORMAL HIGH (ref 39–117)
Alkaline Phosphatase: 568 U/L — ABNORMAL HIGH (ref 39–117)
Alkaline Phosphatase: 468 U/L — ABNORMAL HIGH (ref 39–117)
BUN: 4 mg/dL — ABNORMAL LOW (ref 6–23)
BUN: 6 mg/dL (ref 6–23)
BUN: 7 mg/dL (ref 6–23)
CO2: 24 mEq/L (ref 19–32)
CO2: 28 mEq/L (ref 19–32)
CO2: 26 mEq/L (ref 19–32)
CO2: 28 mEq/L (ref 19–32)
CO2: 25 mEq/L (ref 19–32)
Calcium: 7.7 mg/dL — ABNORMAL LOW (ref 8.4–10.5)
Calcium: 8.5 mg/dL (ref 8.4–10.5)
Chloride: 107 mEq/L (ref 96–112)
Chloride: 102 mEq/L (ref 96–112)
Chloride: 111 mEq/L (ref 96–112)
Creatinine, Ser: 0.58 mg/dL (ref 0.4–1.2)
Creatinine, Ser: 0.85 mg/dL (ref 0.4–1.2)
Creatinine, Ser: 0.86 mg/dL (ref 0.4–1.2)
GFR calc Af Amer: >60 mL/min (ref >60)
GFR calc Af Amer: >60 mL/min (ref >60)
GFR calc non Af Amer: >60 mL/min (ref >60)
GFR calc non Af Amer: >60 mL/min (ref >60)
GFR calc non Af Amer: >60 mL/min (ref >60)
GFR calc non Af Amer: >60 mL/min (ref >60)
GFR calc non Af Amer: >60 mL/min (ref >60)
Glucose, Bld: 92 mg/dL (ref 70–99)
Glucose, Bld: 88 mg/dL (ref 70–99)
Glucose, Bld: 77 mg/dL (ref 70–99)
Potassium: 3.5 mEq/L (ref 3.5–5.1)
Potassium: 3.6 mEq/L (ref 3.5–5.1)
Sodium: 139 mEq/L (ref 135–145)
Total Bilirubin: 6.2 mg/dL — ABNORMAL HIGH (ref 0.3–1.2)
Total Bilirubin: 4.8 mg/dL — ABNORMAL HIGH (ref 0.3–1.2)
Total Bilirubin: 7.3 mg/dL — ABNORMAL HIGH (ref 0.3–1.2)
Total Bilirubin: 4.1 mg/dL — ABNORMAL HIGH (ref 0.3–1.2)
Total Protein: 5.1 g/dL — ABNORMAL LOW (ref 6.0–8.3)
Total Protein: 6.4 g/dL (ref 6.0–8.3)

## 2010-10-22 LAB — CBC
HCT: 36.6 % (ref 36.0–46.0)
HCT: 34.6 % — ABNORMAL LOW (ref 36.0–46.0)
Hemoglobin: 13.6 g/dL (ref 12.0–15.0)
Hemoglobin: 12 g/dL (ref 12.0–15.0)
Hemoglobin: 12.9 g/dL (ref 12.0–15.0)
MCH: 35.2 pg — ABNORMAL HIGH (ref 26.0–34.0)
MCH: 35.7 pg — ABNORMAL HIGH (ref 26.0–34.0)
MCV: 106.7 fL — ABNORMAL HIGH (ref 78.0–100.0)
MCV: 101.5 fL — ABNORMAL HIGH (ref 78.0–100.0)
Platelets: 38 10*3/uL — ABNORMAL LOW (ref 150–400)
Platelets: 46 10*3/uL — ABNORMAL LOW (ref 150–400)
RBC: 3.69 MIL/uL — ABNORMAL LOW (ref 3.87–5.11)
RBC: 3.41 MIL/uL — ABNORMAL LOW (ref 3.87–5.11)
RBC: 3.61 MIL/uL — ABNORMAL LOW (ref 3.87–5.11)
RDW: 17.8 % — ABNORMAL HIGH (ref 11.5–15.5)
WBC: 3.6 10*3/uL — ABNORMAL LOW (ref 4.0–10.5)
WBC: 4.2 10*3/uL (ref 4.0–10.5)

## 2010-10-22 LAB — CK TOTAL AND CKMB (NOT AT ARMC)
CK, MB: 2.7 ng/mL (ref 0.3–4.0)
CK, MB: 2.9 ng/mL (ref 0.3–4.0)
Relative Index: 1.4 (ref 0.0–2.5)
Relative Index: 2.4 (ref 0.0–2.5)
Total CK: 192 U/L — ABNORMAL HIGH (ref 7–177)

## 2010-10-22 LAB — URINALYSIS, ROUTINE W REFLEX MICROSCOPIC
Bilirubin Urine: NEGATIVE
Ketones, ur: NEGATIVE mg/dL
Leukocytes, UA: NEGATIVE
Nitrite: NEGATIVE
Nitrite: NEGATIVE
Nitrite: NEGATIVE
Protein, ur: 100 mg/dL — AB
Specific Gravity, Urine: 1.013 (ref 1.005–1.030)
Specific Gravity, Urine: 1.014 (ref 1.005–1.030)
Urobilinogen, UA: 1 mg/dL (ref 0.0–1.0)
pH: 8.5 — ABNORMAL HIGH (ref 5.0–8.0)
pH: 6.5 (ref 5.0–8.0)

## 2010-10-22 LAB — BASIC METABOLIC PANEL
CO2: 21 mEq/L (ref 19–32)
Calcium: 8.6 mg/dL (ref 8.4–10.5)
Chloride: 112 mEq/L (ref 96–112)
GFR calc Af Amer: >60 mL/min (ref >60)
Sodium: 147 mEq/L — ABNORMAL HIGH (ref 135–145)

## 2010-10-22 LAB — URINE MICROSCOPIC-ADD ON

## 2010-10-22 LAB — DIFFERENTIAL
Basophils Absolute: 0 10*3/uL (ref 0.0–0.1)
Basophils Absolute: 0 10*3/uL (ref 0.0–0.1)
Basophils Relative: 1 % (ref 0–1)
Basophils Relative: 0 % (ref 0–1)
Basophils Relative: 0 % (ref 0–1)
Eosinophils Absolute: 0 10*3/uL (ref 0.0–0.7)
Eosinophils Relative: 1 % (ref 0–5)
Lymphocytes Relative: 15 % (ref 12–46)
Lymphocytes Relative: 8 % — ABNORMAL LOW (ref 12–46)
Monocytes Absolute: 0.3 10*3/uL (ref 0.1–1.0)
Monocytes Relative: 8 % (ref 3–12)
Neutro Abs: 2.5 10*3/uL (ref 1.7–7.7)
Neutrophils Relative %: 75 % (ref 43–77)
Neutrophils Relative %: 80 % — ABNORMAL HIGH (ref 43–77)
Neutrophils Relative %: 86 % — ABNORMAL HIGH (ref 43–77)

## 2010-10-22 LAB — CARDIAC PANEL(CRET KIN+CKTOT+MB+TROPI)
CK, MB: 1.1 ng/mL (ref 0.3–4.0)
CK, MB: 3.6 ng/mL (ref 0.3–4.0)
Total CK: 160 U/L (ref 7–177)
Total CK: 176 U/L (ref 7–177)
Troponin I: 0.16 ng/mL — ABNORMAL HIGH (ref 0.00–0.06)
Troponin I: 0.61 ng/mL (ref 0.00–0.06)

## 2010-10-22 LAB — LIPASE, BLOOD: Lipase: 20 U/L (ref 11–59)

## 2010-10-22 LAB — RAPID URINE DRUG SCREEN, HOSP PERFORMED
Amphetamines: NOT DETECTED
Amphetamines: NOT DETECTED
Barbiturates: NOT DETECTED
Barbiturates: NOT DETECTED
Cocaine: NOT DETECTED
Opiates: POSITIVE — AB
Tetrahydrocannabinol: POSITIVE — AB
Tetrahydrocannabinol: POSITIVE — AB

## 2010-10-22 LAB — MAGNESIUM: Magnesium: 1.4 mg/dL — ABNORMAL LOW (ref 1.5–2.5)

## 2010-10-22 LAB — PREGNANCY, URINE: Preg Test, Ur: NEGATIVE

## 2010-10-22 LAB — URINE CULTURE

## 2010-10-22 LAB — AMYLASE: Amylase: 75 U/L (ref 0–105)

## 2010-10-22 LAB — POCT PREGNANCY, URINE: Preg Test, Ur: NEGATIVE

## 2010-10-23 LAB — URINE MICROSCOPIC-ADD ON

## 2010-10-23 LAB — DIFFERENTIAL
Basophils Relative: 1 % (ref 0–1)
Eosinophils Absolute: 0 10*3/uL (ref 0.0–0.7)
Eosinophils Relative: 0 % (ref 0–5)
Lymphs Abs: 0.3 10*3/uL — ABNORMAL LOW (ref 0.7–4.0)
Monocytes Absolute: 0.1 10*3/uL (ref 0.1–1.0)
Monocytes Relative: 6 % (ref 3–12)

## 2010-10-23 LAB — CBC
HCT: 36.9 % (ref 36.0–46.0)
Hemoglobin: 12.8 g/dL (ref 12.0–15.0)
MCHC: 34.7 g/dL (ref 30.0–36.0)
RDW: 18.1 % — ABNORMAL HIGH (ref 11.5–15.5)
WBC: 2.3 10*3/uL — ABNORMAL LOW (ref 4.0–10.5)

## 2010-10-23 LAB — COMPREHENSIVE METABOLIC PANEL
ALT: 62 U/L — ABNORMAL HIGH (ref 0–35)
AST: 125 U/L — ABNORMAL HIGH (ref 0–37)
Alkaline Phosphatase: 633 U/L — ABNORMAL HIGH (ref 39–117)
Calcium: 8 mg/dL — ABNORMAL LOW (ref 8.4–10.5)
GFR calc Af Amer: >60 mL/min (ref >60)
Potassium: 3.6 mEq/L (ref 3.5–5.1)
Sodium: 140 mEq/L (ref 135–145)
Total Protein: 6.2 g/dL (ref 6.0–8.3)

## 2010-10-23 LAB — URINALYSIS, ROUTINE W REFLEX MICROSCOPIC
Glucose, UA: NEGATIVE mg/dL
Hgb urine dipstick: NEGATIVE
Protein, ur: 30 mg/dL — AB
Urobilinogen, UA: 1 mg/dL (ref 0.0–1.0)

## 2010-10-23 LAB — POCT PREGNANCY, URINE: Preg Test, Ur: NEGATIVE

## 2010-10-24 LAB — POCT I-STAT 3, ART BLOOD GAS (G3+)
Bicarbonate: 20.7 mEq/L (ref 20.0–24.0)
O2 Saturation: 93 %
Patient temperature: 102
TCO2: 22 mmol/L (ref 0–100)
pH, Arterial: 7.238 — ABNORMAL LOW (ref 7.350–7.400)

## 2010-10-24 LAB — BASIC METABOLIC PANEL
BUN: <1 mg/dL — ABNORMAL LOW (ref 6–23)
CO2: 22 mEq/L (ref 19–32)
CO2: 25 mEq/L (ref 19–32)
CO2: 27 mEq/L (ref 19–32)
Calcium: 7.6 mg/dL — ABNORMAL LOW (ref 8.4–10.5)
Calcium: 7.9 mg/dL — ABNORMAL LOW (ref 8.4–10.5)
Chloride: 105 mEq/L (ref 96–112)
Chloride: 109 mEq/L (ref 96–112)
GFR calc Af Amer: >60 mL/min (ref >60)
GFR calc non Af Amer: >60 mL/min (ref >60)
Glucose, Bld: 105 mg/dL — ABNORMAL HIGH (ref 70–99)
Glucose, Bld: 159 mg/dL — ABNORMAL HIGH (ref 70–99)
Glucose, Bld: 89 mg/dL (ref 70–99)
Potassium: 3.9 mEq/L (ref 3.5–5.1)
Sodium: 135 mEq/L (ref 135–145)
Sodium: 140 mEq/L (ref 135–145)
Sodium: 141 mEq/L (ref 135–145)

## 2010-10-24 LAB — GLUCOSE, CAPILLARY: Glucose-Capillary: 132 mg/dL — ABNORMAL HIGH (ref 70–99)

## 2010-10-24 LAB — CARDIAC PANEL(CRET KIN+CKTOT+MB+TROPI)
CK, MB: 2.8 ng/mL (ref 0.3–4.0)
CK, MB: 3.9 ng/mL (ref 0.3–4.0)
CK, MB: 4.1 ng/mL — ABNORMAL HIGH (ref 0.3–4.0)
Relative Index: 3.1 — ABNORMAL HIGH (ref 0.0–2.5)
Total CK: 116 U/L (ref 7–177)
Total CK: 131 U/L (ref 7–177)
Troponin I: 0.35 ng/mL — ABNORMAL HIGH (ref 0.00–0.06)

## 2010-10-24 LAB — CULTURE, BLOOD (ROUTINE X 2): Culture: NO GROWTH

## 2010-10-24 LAB — BLOOD GAS, ARTERIAL
Bicarbonate: 17.5 mEq/L — ABNORMAL LOW (ref 20.0–24.0)
O2 Content: 1.5 L/min
O2 Saturation: 94.7 %
Patient temperature: 99.9

## 2010-10-24 LAB — CBC
HCT: 32.7 % — ABNORMAL LOW (ref 36.0–46.0)
Hemoglobin: 11.4 g/dL — ABNORMAL LOW (ref 12.0–15.0)
Hemoglobin: 13.1 g/dL (ref 12.0–15.0)
MCHC: 35 g/dL (ref 30.0–36.0)
MCHC: 35.5 g/dL (ref 30.0–36.0)
Platelets: 56 10*3/uL — ABNORMAL LOW (ref 150–400)
RBC: 3.59 MIL/uL — ABNORMAL LOW (ref 3.87–5.11)
RDW: 16.8 % — ABNORMAL HIGH (ref 11.5–15.5)
WBC: 7.9 10*3/uL (ref 4.0–10.5)

## 2010-10-24 LAB — STREP A DNA PROBE: Group A Strep Probe: NEGATIVE

## 2010-10-26 LAB — CBC
HCT: 39.5 % (ref 36.0–46.0)
Hemoglobin: 12.6 g/dL (ref 12.0–15.0)
Hemoglobin: 13 g/dL (ref 12.0–15.0)
Hemoglobin: 13.5 g/dL (ref 12.0–15.0)
MCHC: 33.9 g/dL (ref 30.0–36.0)
RBC: 3.54 MIL/uL — ABNORMAL LOW (ref 3.87–5.11)
RBC: 3.61 MIL/uL — ABNORMAL LOW (ref 3.87–5.11)
RBC: 3.76 MIL/uL — ABNORMAL LOW (ref 3.87–5.11)
RDW: 18.1 % — ABNORMAL HIGH (ref 11.5–15.5)
RDW: 18.1 % — ABNORMAL HIGH (ref 11.5–15.5)
WBC: 2.4 10*3/uL — ABNORMAL LOW (ref 4.0–10.5)
WBC: 3.7 10*3/uL — ABNORMAL LOW (ref 4.0–10.5)

## 2010-10-26 LAB — COMPREHENSIVE METABOLIC PANEL
ALT: 51 U/L — ABNORMAL HIGH (ref 0–35)
ALT: 52 U/L — ABNORMAL HIGH (ref 0–35)
AST: 99 U/L — ABNORMAL HIGH (ref 0–37)
Alkaline Phosphatase: 483 U/L — ABNORMAL HIGH (ref 39–117)
Alkaline Phosphatase: 490 U/L — ABNORMAL HIGH (ref 39–117)
CO2: 25 mEq/L (ref 19–32)
Calcium: 8.2 mg/dL — ABNORMAL LOW (ref 8.4–10.5)
Chloride: 110 mEq/L (ref 96–112)
GFR calc Af Amer: >60 mL/min (ref >60)
Glucose, Bld: 62 mg/dL — ABNORMAL LOW (ref 70–99)
Glucose, Bld: 76 mg/dL (ref 70–99)
Potassium: 3.7 mEq/L (ref 3.5–5.1)
Potassium: 3.8 mEq/L (ref 3.5–5.1)
Sodium: 137 mEq/L (ref 135–145)
Sodium: 141 mEq/L (ref 135–145)
Total Bilirubin: 3.8 mg/dL — ABNORMAL HIGH (ref 0.3–1.2)
Total Protein: 6.1 g/dL (ref 6.0–8.3)
Total Protein: 6.2 g/dL (ref 6.0–8.3)

## 2010-10-26 LAB — LIPASE, BLOOD
Lipase: 24 U/L (ref 11–59)
Lipase: 25 U/L (ref 11–59)

## 2010-10-26 LAB — TYPE AND SCREEN: Antibody Screen: NEGATIVE

## 2010-10-26 LAB — AMYLASE
Amylase: 81 U/L (ref 0–105)
Amylase: 84 U/L (ref 0–105)

## 2010-10-28 LAB — DIFFERENTIAL
Basophils Absolute: 0 10*3/uL (ref 0.0–0.1)
Basophils Relative: 0 % (ref 0–1)
Eosinophils Relative: 0 % (ref 0–5)
Lymphocytes Relative: 10 % — ABNORMAL LOW (ref 12–46)
Monocytes Relative: 6 % (ref 3–12)
Neutro Abs: 4.3 10*3/uL (ref 1.7–7.7)

## 2010-10-28 LAB — COMPREHENSIVE METABOLIC PANEL
Alkaline Phosphatase: 442 U/L — ABNORMAL HIGH (ref 39–117)
BUN: 5 mg/dL — ABNORMAL LOW (ref 6–23)
Chloride: 101 mEq/L (ref 96–112)
Glucose, Bld: 69 mg/dL — ABNORMAL LOW (ref 70–99)
Potassium: 3.2 mEq/L — ABNORMAL LOW (ref 3.5–5.1)
Total Bilirubin: 3.9 mg/dL — ABNORMAL HIGH (ref 0.3–1.2)

## 2010-10-28 LAB — CULTURE, BLOOD (ROUTINE X 2): Culture: NO GROWTH

## 2010-10-28 LAB — URINALYSIS, ROUTINE W REFLEX MICROSCOPIC
Glucose, UA: NEGATIVE mg/dL
Hgb urine dipstick: NEGATIVE
Ketones, ur: 15 mg/dL — AB
Protein, ur: NEGATIVE mg/dL

## 2010-10-28 LAB — URINE CULTURE

## 2010-10-28 LAB — CBC
HCT: 31.6 % — ABNORMAL LOW (ref 36.0–46.0)
Hemoglobin: 10.8 g/dL — ABNORMAL LOW (ref 12.0–15.0)
MCV: 108.3 fL — ABNORMAL HIGH (ref 78.0–100.0)
RDW: 17.3 % — ABNORMAL HIGH (ref 11.5–15.5)

## 2010-10-28 LAB — URINE MICROSCOPIC-ADD ON

## 2010-10-30 ENCOUNTER — Other Ambulatory Visit: Payer: Self-pay | Admitting: Oncology

## 2010-10-30 ENCOUNTER — Encounter (HOSPITAL_BASED_OUTPATIENT_CLINIC_OR_DEPARTMENT_OTHER): Payer: Medicare Other | Admitting: Oncology

## 2010-10-30 DIAGNOSIS — D6959 Other secondary thrombocytopenia: Secondary | ICD-10-CM

## 2010-10-30 DIAGNOSIS — K754 Autoimmune hepatitis: Secondary | ICD-10-CM

## 2010-10-30 DIAGNOSIS — D731 Hypersplenism: Secondary | ICD-10-CM

## 2010-10-30 DIAGNOSIS — K7460 Unspecified cirrhosis of liver: Secondary | ICD-10-CM

## 2010-10-30 LAB — CBC WITH DIFFERENTIAL/PLATELET
BASO%: 0.5 % (ref 0.0–2.0)
EOS%: 2.8 % (ref 0.0–7.0)
HCT: 38.8 % (ref 34.8–46.6)
LYMPH%: 11.6 % — ABNORMAL LOW (ref 14.0–49.7)
MCH: 35.2 pg — ABNORMAL HIGH (ref 25.1–34.0)
MCHC: 34.3 g/dL (ref 31.5–36.0)
NEUT%: 81.9 % — ABNORMAL HIGH (ref 38.4–76.8)
Platelets: 128 10*3/uL — ABNORMAL LOW (ref 145–400)

## 2010-10-30 LAB — COMPREHENSIVE METABOLIC PANEL
ALT: 66 U/L — ABNORMAL HIGH (ref 0–35)
AST: 167 U/L — ABNORMAL HIGH (ref 0–37)
Creatinine, Ser: 0.63 mg/dL (ref 0.40–1.20)
Total Bilirubin: 4.7 mg/dL — ABNORMAL HIGH (ref 0.3–1.2)

## 2010-11-09 LAB — COMPREHENSIVE METABOLIC PANEL
Alkaline Phosphatase: 531 U/L — ABNORMAL HIGH (ref 39–117)
BUN: 5 mg/dL — ABNORMAL LOW (ref 6–23)
GFR calc non Af Amer: >60 mL/min (ref >60)
Glucose, Bld: 128 mg/dL — ABNORMAL HIGH (ref 70–99)
Potassium: 3.2 mEq/L — ABNORMAL LOW (ref 3.5–5.1)
Total Protein: 6.4 g/dL (ref 6.0–8.3)

## 2010-11-09 LAB — AMMONIA: Ammonia: 75 umol/L — ABNORMAL HIGH (ref 11–35)

## 2010-11-09 LAB — DIFFERENTIAL
Basophils Absolute: 0 10*3/uL (ref 0.0–0.1)
Basophils Relative: 0 % (ref 0–1)
Monocytes Relative: 5 % (ref 3–12)
Neutro Abs: 3.4 10*3/uL (ref 1.7–7.7)
Neutrophils Relative %: 85 % — ABNORMAL HIGH (ref 43–77)

## 2010-11-09 LAB — ETHANOL: Alcohol, Ethyl (B): <5 mg/dL (ref 0–10)

## 2010-11-09 LAB — URINALYSIS, ROUTINE W REFLEX MICROSCOPIC
Bilirubin Urine: NEGATIVE
Glucose, UA: NEGATIVE mg/dL
Hgb urine dipstick: NEGATIVE
Ketones, ur: NEGATIVE mg/dL
Leukocytes, UA: NEGATIVE
Nitrite: NEGATIVE
Protein, ur: 30 mg/dL — AB
Specific Gravity, Urine: 1.018 (ref 1.005–1.030)
Urobilinogen, UA: 1 mg/dL (ref 0.0–1.0)
pH: 8.5 — ABNORMAL HIGH (ref 5.0–8.0)

## 2010-11-09 LAB — URINE MICROSCOPIC-ADD ON

## 2010-11-09 LAB — PROTIME-INR
INR: 1.53 — ABNORMAL HIGH (ref 0.00–1.49)
Prothrombin Time: 18.3 seconds — ABNORMAL HIGH (ref 11.6–15.2)

## 2010-11-09 LAB — LIPASE, BLOOD: Lipase: 24 U/L (ref 11–59)

## 2010-11-09 LAB — CBC
HCT: 39.3 % (ref 36.0–46.0)
Hemoglobin: 13.4 g/dL (ref 12.0–15.0)
MCHC: 34.1 g/dL (ref 30.0–36.0)
RDW: 16.7 % — ABNORMAL HIGH (ref 11.5–15.5)

## 2010-11-14 LAB — COMPREHENSIVE METABOLIC PANEL
ALT: 66 U/L — ABNORMAL HIGH (ref 0–35)
ALT: 77 U/L — ABNORMAL HIGH (ref 0–35)
ALT: 90 U/L — ABNORMAL HIGH (ref 0–35)
AST: 176 U/L — ABNORMAL HIGH (ref 0–37)
AST: 236 U/L — ABNORMAL HIGH (ref 0–37)
Albumin: 2.5 g/dL — ABNORMAL LOW (ref 3.5–5.2)
Albumin: 2.8 g/dL — ABNORMAL LOW (ref 3.5–5.2)
Albumin: 2.9 g/dL — ABNORMAL LOW (ref 3.5–5.2)
Alkaline Phosphatase: 535 U/L — ABNORMAL HIGH (ref 39–117)
Alkaline Phosphatase: 676 U/L — ABNORMAL HIGH (ref 39–117)
BUN: 2 mg/dL — ABNORMAL LOW (ref 6–23)
CO2: 24 mEq/L (ref 19–32)
CO2: 27 mEq/L (ref 19–32)
Calcium: 7.7 mg/dL — ABNORMAL LOW (ref 8.4–10.5)
Calcium: 8 mg/dL — ABNORMAL LOW (ref 8.4–10.5)
Calcium: 8.1 mg/dL — ABNORMAL LOW (ref 8.4–10.5)
Chloride: 106 mEq/L (ref 96–112)
Chloride: 111 mEq/L (ref 96–112)
Creatinine, Ser: 0.57 mg/dL (ref 0.4–1.2)
Creatinine, Ser: 0.59 mg/dL (ref 0.4–1.2)
GFR calc Af Amer: >60 mL/min (ref >60)
GFR calc Af Amer: >60 mL/min (ref >60)
GFR calc Af Amer: >60 mL/min (ref >60)
GFR calc non Af Amer: >60 mL/min (ref >60)
GFR calc non Af Amer: >60 mL/min (ref >60)
Glucose, Bld: 105 mg/dL — ABNORMAL HIGH (ref 70–99)
Potassium: 3.4 mEq/L — ABNORMAL LOW (ref 3.5–5.1)
Potassium: 3.5 mEq/L (ref 3.5–5.1)
Sodium: 140 mEq/L (ref 135–145)
Sodium: 140 mEq/L (ref 135–145)
Total Bilirubin: 4.5 mg/dL — ABNORMAL HIGH (ref 0.3–1.2)
Total Bilirubin: 4.9 mg/dL — ABNORMAL HIGH (ref 0.3–1.2)
Total Protein: 5.5 g/dL — ABNORMAL LOW (ref 6.0–8.3)
Total Protein: 6.5 g/dL (ref 6.0–8.3)
Total Protein: 6.5 g/dL (ref 6.0–8.3)

## 2010-11-14 LAB — GLUCOSE, CAPILLARY: Glucose-Capillary: 72 mg/dL (ref 70–99)

## 2010-11-14 LAB — HEPATITIS PANEL, ACUTE: Hep A IgM: NEGATIVE

## 2010-11-14 LAB — DIFFERENTIAL
Basophils Absolute: 0 10*3/uL (ref 0.0–0.1)
Basophils Absolute: 0 10*3/uL (ref 0.0–0.1)
Basophils Relative: 0 % (ref 0–1)
Eosinophils Absolute: 0 10*3/uL (ref 0.0–0.7)
Eosinophils Relative: 0 % (ref 0–5)
Eosinophils Relative: 1 % (ref 0–5)
Eosinophils Relative: 1 % (ref 0–5)
Lymphocytes Relative: 14 % (ref 12–46)
Lymphocytes Relative: 26 % (ref 12–46)
Lymphs Abs: 0.4 10*3/uL — ABNORMAL LOW (ref 0.7–4.0)
Lymphs Abs: 0.7 10*3/uL (ref 0.7–4.0)
Monocytes Absolute: 0.2 10*3/uL (ref 0.1–1.0)
Monocytes Absolute: 0.2 10*3/uL (ref 0.1–1.0)
Monocytes Absolute: 0.5 10*3/uL (ref 0.1–1.0)
Monocytes Relative: 5 % (ref 3–12)
Monocytes Relative: 6 % (ref 3–12)
Neutro Abs: 1.9 10*3/uL (ref 1.7–7.7)
Neutro Abs: 6.3 10*3/uL (ref 1.7–7.7)
Neutrophils Relative %: 66 % (ref 43–77)

## 2010-11-14 LAB — AMMONIA: Ammonia: 63 umol/L — ABNORMAL HIGH (ref 11–35)

## 2010-11-14 LAB — CBC
HCT: 31.4 % — ABNORMAL LOW (ref 36.0–46.0)
Hemoglobin: 10.8 g/dL — ABNORMAL LOW (ref 12.0–15.0)
MCHC: 33.8 g/dL (ref 30.0–36.0)
Platelets: 50 10*3/uL — ABNORMAL LOW (ref 150–400)
Platelets: 63 10*3/uL — ABNORMAL LOW (ref 150–400)
Platelets: 88 10*3/uL — ABNORMAL LOW (ref 150–400)
RBC: 3.74 MIL/uL — ABNORMAL LOW (ref 3.87–5.11)
RDW: 16 % — ABNORMAL HIGH (ref 11.5–15.5)
RDW: 16.5 % — ABNORMAL HIGH (ref 11.5–15.5)
WBC: 2.9 10*3/uL — ABNORMAL LOW (ref 4.0–10.5)
WBC: 8 10*3/uL (ref 4.0–10.5)

## 2010-11-14 LAB — HEMOGLOBIN A1C
Hgb A1c MFr Bld: 4.1 % — ABNORMAL LOW (ref 4.6–6.1)
Mean Plasma Glucose: 71 mg/dL

## 2010-11-14 LAB — URINALYSIS, ROUTINE W REFLEX MICROSCOPIC
Nitrite: NEGATIVE
Specific Gravity, Urine: 1.023 (ref 1.005–1.030)
Urobilinogen, UA: 2 mg/dL — ABNORMAL HIGH (ref 0.0–1.0)
pH: 6.5 (ref 5.0–8.0)

## 2010-11-14 LAB — URINE CULTURE

## 2010-11-14 LAB — PROTIME-INR: INR: 1.4 (ref 0.00–1.49)

## 2010-11-14 LAB — ETHANOL: Alcohol, Ethyl (B): 128 mg/dL — ABNORMAL HIGH (ref 0–10)

## 2010-11-14 LAB — URINE MICROSCOPIC-ADD ON

## 2010-11-17 LAB — POCT PREGNANCY, URINE: Preg Test, Ur: NEGATIVE

## 2010-11-17 LAB — COMPREHENSIVE METABOLIC PANEL
ALT: 51 U/L — ABNORMAL HIGH (ref 0–35)
Alkaline Phosphatase: 525 U/L — ABNORMAL HIGH (ref 39–117)
BUN: 9 mg/dL (ref 6–23)
CO2: 22 mEq/L (ref 19–32)
Chloride: 112 mEq/L (ref 96–112)
GFR calc non Af Amer: >60 mL/min (ref >60)
Glucose, Bld: 59 mg/dL — ABNORMAL LOW (ref 70–99)
Potassium: 3.8 mEq/L (ref 3.5–5.1)
Sodium: 143 mEq/L (ref 135–145)
Total Bilirubin: 4 mg/dL — ABNORMAL HIGH (ref 0.3–1.2)
Total Protein: 6.1 g/dL (ref 6.0–8.3)

## 2010-11-17 LAB — CBC
HCT: 39.1 % (ref 36.0–46.0)
Hemoglobin: 13.1 g/dL (ref 12.0–15.0)
RBC: 3.78 MIL/uL — ABNORMAL LOW (ref 3.87–5.11)
RDW: 19.5 % — ABNORMAL HIGH (ref 11.5–15.5)
WBC: 3.7 10*3/uL — ABNORMAL LOW (ref 4.0–10.5)

## 2010-11-17 LAB — PROTIME-INR
INR: 1.5 (ref 0.00–1.49)
Prothrombin Time: 18.9 seconds — ABNORMAL HIGH (ref 11.6–15.2)

## 2010-11-17 LAB — URINE MICROSCOPIC-ADD ON

## 2010-11-17 LAB — URINALYSIS, ROUTINE W REFLEX MICROSCOPIC
Bilirubin Urine: NEGATIVE
Glucose, UA: NEGATIVE mg/dL
Hgb urine dipstick: NEGATIVE
Protein, ur: 30 mg/dL — AB

## 2010-11-17 LAB — URINE CULTURE

## 2010-11-17 LAB — DIFFERENTIAL
Basophils Absolute: 0 10*3/uL (ref 0.0–0.1)
Basophils Relative: 0 % (ref 0–1)
Eosinophils Absolute: 0 10*3/uL (ref 0.0–0.7)
Monocytes Relative: 4 % (ref 3–12)
Neutro Abs: 3.3 10*3/uL (ref 1.7–7.7)
Neutrophils Relative %: 90 % — ABNORMAL HIGH (ref 43–77)

## 2010-11-24 LAB — COMPREHENSIVE METABOLIC PANEL
CO2: 20 mEq/L (ref 19–32)
Calcium: 8.5 mg/dL (ref 8.4–10.5)
Creatinine, Ser: 0.69 mg/dL (ref 0.4–1.2)
GFR calc non Af Amer: >60 mL/min (ref >60)
Glucose, Bld: 107 mg/dL — ABNORMAL HIGH (ref 70–99)

## 2010-11-24 LAB — CBC
Hemoglobin: 14.4 g/dL (ref 12.0–15.0)
MCHC: 34.5 g/dL (ref 30.0–36.0)
MCV: 100.3 fL — ABNORMAL HIGH (ref 78.0–100.0)
RBC: 4.18 MIL/uL (ref 3.87–5.11)
RDW: 16.1 % — ABNORMAL HIGH (ref 11.5–15.5)

## 2010-11-24 LAB — DIFFERENTIAL
Eosinophils Absolute: 0 10*3/uL (ref 0.0–0.7)
Lymphocytes Relative: 14 % (ref 12–46)
Lymphs Abs: 0.6 10*3/uL — ABNORMAL LOW (ref 0.7–4.0)
Neutro Abs: 3.3 10*3/uL (ref 1.7–7.7)
Neutrophils Relative %: 79 % — ABNORMAL HIGH (ref 43–77)

## 2010-11-24 LAB — URINALYSIS, ROUTINE W REFLEX MICROSCOPIC
Ketones, ur: 15 mg/dL — AB
Protein, ur: 100 mg/dL — AB
Urobilinogen, UA: 1 mg/dL (ref 0.0–1.0)

## 2010-11-24 LAB — LIPASE, BLOOD: Lipase: 23 U/L (ref 11–59)

## 2010-11-24 LAB — PREGNANCY, URINE: Preg Test, Ur: NEGATIVE

## 2010-11-24 LAB — URINE MICROSCOPIC-ADD ON

## 2010-12-08 ENCOUNTER — Inpatient Hospital Stay (HOSPITAL_COMMUNITY)
Admission: EM | Admit: 2010-12-08 | Discharge: 2010-12-11 | DRG: 690 | Disposition: A | Discharge status: Home / Self Care | Payer: Medicare Other | Attending: Internal Medicine | Admitting: Internal Medicine

## 2010-12-08 ENCOUNTER — Emergency Department (HOSPITAL_COMMUNITY): Payer: Medicare Other

## 2010-12-08 DIAGNOSIS — R161 Splenomegaly, not elsewhere classified: Secondary | ICD-10-CM | POA: Diagnosis present

## 2010-12-08 DIAGNOSIS — R509 Fever, unspecified: Secondary | ICD-10-CM | POA: Diagnosis present

## 2010-12-08 DIAGNOSIS — G40909 Epilepsy, unspecified, not intractable, without status epilepticus: Secondary | ICD-10-CM | POA: Diagnosis present

## 2010-12-08 DIAGNOSIS — F1410 Cocaine abuse, uncomplicated: Secondary | ICD-10-CM | POA: Diagnosis present

## 2010-12-08 DIAGNOSIS — K7460 Unspecified cirrhosis of liver: Secondary | ICD-10-CM | POA: Diagnosis present

## 2010-12-08 DIAGNOSIS — K754 Autoimmune hepatitis: Secondary | ICD-10-CM | POA: Diagnosis present

## 2010-12-08 DIAGNOSIS — A498 Other bacterial infections of unspecified site: Secondary | ICD-10-CM | POA: Diagnosis present

## 2010-12-08 DIAGNOSIS — N390 Urinary tract infection, site not specified: Principal | ICD-10-CM | POA: Diagnosis present

## 2010-12-08 DIAGNOSIS — D684 Acquired coagulation factor deficiency: Secondary | ICD-10-CM | POA: Diagnosis present

## 2010-12-08 DIAGNOSIS — R197 Diarrhea, unspecified: Secondary | ICD-10-CM | POA: Diagnosis present

## 2010-12-08 DIAGNOSIS — D696 Thrombocytopenia, unspecified: Secondary | ICD-10-CM | POA: Diagnosis present

## 2010-12-08 DIAGNOSIS — R112 Nausea with vomiting, unspecified: Secondary | ICD-10-CM | POA: Diagnosis present

## 2010-12-08 LAB — CBC
HCT: 38.1 % (ref 36.0–46.0)
MCH: 34.8 pg — ABNORMAL HIGH (ref 26.0–34.0)
MCV: 99 fL (ref 78.0–100.0)
Platelets: 94 10*3/uL — ABNORMAL LOW (ref 150–400)
RBC: 3.85 MIL/uL — ABNORMAL LOW (ref 3.87–5.11)
WBC: 3.9 10*3/uL — ABNORMAL LOW (ref 4.0–10.5)

## 2010-12-08 LAB — COMPREHENSIVE METABOLIC PANEL
ALT: 59 U/L — ABNORMAL HIGH (ref 0–35)
AST: 192 U/L — ABNORMAL HIGH (ref 0–37)
Albumin: 2.5 g/dL — ABNORMAL LOW (ref 3.5–5.2)
Alkaline Phosphatase: 1181 U/L — ABNORMAL HIGH (ref 39–117)
CO2: 23 mEq/L (ref 19–32)
Chloride: 105 mEq/L (ref 96–112)
Creatinine, Ser: 0.72 mg/dL (ref 0.4–1.2)
GFR calc Af Amer: >60 mL/min (ref >60)
GFR calc non Af Amer: >60 mL/min (ref >60)
Potassium: 4.2 mEq/L (ref 3.5–5.1)
Total Bilirubin: 9.2 mg/dL — ABNORMAL HIGH (ref 0.3–1.2)

## 2010-12-08 LAB — RAPID URINE DRUG SCREEN, HOSP PERFORMED
Amphetamines: NOT DETECTED
Barbiturates: NOT DETECTED
Opiates: NOT DETECTED

## 2010-12-08 LAB — URINE MICROSCOPIC-ADD ON

## 2010-12-08 LAB — DIFFERENTIAL
Basophils Relative: 0 % (ref 0–1)
Eosinophils Relative: 1 % (ref 0–5)
Lymphs Abs: 0.2 10*3/uL — ABNORMAL LOW (ref 0.7–4.0)
Monocytes Absolute: 0.2 10*3/uL (ref 0.1–1.0)
Monocytes Relative: 5 % (ref 3–12)

## 2010-12-08 LAB — AMMONIA: Ammonia: 50 umol/L (ref 11–60)

## 2010-12-08 LAB — URINALYSIS, ROUTINE W REFLEX MICROSCOPIC
Glucose, UA: NEGATIVE mg/dL
Hgb urine dipstick: NEGATIVE
Ketones, ur: 15 mg/dL — AB
Nitrite: NEGATIVE
Specific Gravity, Urine: 1.018 (ref 1.005–1.030)
pH: 8 (ref 5.0–8.0)

## 2010-12-08 LAB — APTT: aPTT: 39 seconds — ABNORMAL HIGH (ref 24–37)

## 2010-12-08 MED ORDER — IOHEXOL 300 MG/ML  SOLN
100.0000 mL | Freq: Once | INTRAMUSCULAR | Status: AC | PRN
  Administered 2010-12-08: 100 mL via INTRAVENOUS

## 2010-12-09 DIAGNOSIS — R112 Nausea with vomiting, unspecified: Secondary | ICD-10-CM

## 2010-12-09 DIAGNOSIS — K7460 Unspecified cirrhosis of liver: Secondary | ICD-10-CM

## 2010-12-09 LAB — COMPREHENSIVE METABOLIC PANEL
ALT: 43 U/L — ABNORMAL HIGH (ref 0–35)
Albumin: 2.1 g/dL — ABNORMAL LOW (ref 3.5–5.2)
Alkaline Phosphatase: 994 U/L — ABNORMAL HIGH (ref 39–117)
Chloride: 107 mEq/L (ref 96–112)
Glucose, Bld: 109 mg/dL — ABNORMAL HIGH (ref 70–99)
Potassium: 3.4 mEq/L — ABNORMAL LOW (ref 3.5–5.1)
Sodium: 140 mEq/L (ref 135–145)
Total Bilirubin: 6.7 mg/dL — ABNORMAL HIGH (ref 0.3–1.2)
Total Protein: 5.3 g/dL — ABNORMAL LOW (ref 6.0–8.3)

## 2010-12-09 LAB — CBC
HCT: 33.3 % — ABNORMAL LOW (ref 36.0–46.0)
RBC: 3.29 MIL/uL — ABNORMAL LOW (ref 3.87–5.11)
RDW: 17.3 % — ABNORMAL HIGH (ref 11.5–15.5)
WBC: 2.7 10*3/uL — ABNORMAL LOW (ref 4.0–10.5)

## 2010-12-09 LAB — MAGNESIUM: Magnesium: 1.7 mg/dL (ref 1.5–2.5)

## 2010-12-09 LAB — PROTIME-INR: INR: 2.14 — ABNORMAL HIGH (ref 0.00–1.49)

## 2010-12-09 NOTE — H&P (Signed)
  [name], [name]               ACCOUNT NO.:  [id number]  MEDICAL RECORD NO.:  [id number]           PATIENT TYPE:  I  LOCATION:  1304                         FACILITY:  [facility]  PHYSICIAN:  Conley Canal, MD      DATE OF BIRTH:  1985/08/12  DATE OF ADMISSION:  12/08/2010 DATE OF DISCHARGE:                             HISTORY & PHYSICAL   ADDENDUM:  ASSESSMENT AND PLAN:  Thrombocytopenia.  The patient followed by Dr. Thornton Papas.  We will continue Promacta as the patient was taking previously.     Conley Canal, MD     SR/MEDQ  D:  12/08/2010  T:  12/08/2010  Job:  [id number]  cc:   Ladene Artist, M.D. Fax: [phone]  Barbette Hair. Arlyce Dice, MD,FACG [address] [place] [address]  Dr. Lorelee New  Electronically Signed by Conley Canal  on 12/09/2010 07:00:02 PM

## 2010-12-09 NOTE — H&P (Signed)
[name], Rojas               ACCOUNT NO.:  [id number]  MEDICAL RECORD NO.:  [id number]           PATIENT TYPE:  E  LOCATION:  WLED                         FACILITY:  [facility]  PHYSICIAN:  Conley Canal, MD      DATE OF BIRTH:  08/01/1986  DATE OF ADMISSION:  12/08/2010 DATE OF DISCHARGE:                             HISTORY & PHYSICAL   GASTROENTEROLOGIST:  Barbette Hair. Arlyce Dice, MD, [facility]  PRIMARY CARE PHYSICIAN:  Dr. Jerilee Field.  CHIEF COMPLAINT:  Nausea, vomiting, diarrhea x2 days.  HISTORY OF PRESENT ILLNESS:  [name] is a 25 year old female with history of autoimmune hepatitis, cocaine abuse, seizure disorder, migraine headaches who comes in with complaints of nausea, vomiting, abdominal pain, some diarrhea, and headache since yesterday.  She was noted to have fever greater than 101 in the emergency room.  Apparently she has episodes of recurrent nausea, vomiting, thought to be related to autoimmune hepatitis and medications.  She denies any cough.  She says that she noticed a tinge of blood in the vomitus, otherwise the vomitus was clear.  Denies eating out recently.  Denies recent alcohol intake. Upon presentation to the emergency room, she had CT abdomen and pelvis which showed severe changes of cirrhosis with multiple varices and portal venous collaterals with splenomegaly and no ascites and a slightly thick-walled gallbladder with no gallstones as well as bilateral ovarian follicles with a small amount of free fluid within the pelvis.  She has received some Tylenol and morphine as well as Reglan and Zofran and has been referred to Hospitalist Service for further management.  She mentions that she continues to have a headache and she is hungry.  Of note is that her urinalysis was positive for possible infection and her urine drug screen was positive for marijuana and cocaine.  PAST MEDICAL HISTORY: 1. Autoimmune hepatitis with liver cirrhosis. 2.  Splenomegaly. 3. Seizure disorder.  ALLERGIES:  MOTRIN.  FAMILY HISTORY:  Positive for hypertension in her mother.  SOCIAL HISTORY:  Positive for cocaine abuse.  Denies cigarette smoking. She says she last drank alcohol more than 4 months ago.  REVIEW OF SYSTEMS:  Unremarkable except as highlighted in the history of present illness.  HOME MEDICATIONS:  Include Keppra, tramadol, Lasix, Promacta; azathioprine.  PHYSICAL EXAMINATION:  GENERAL:  On examination, this is a young lady who is in some discomfort. VITAL SIGNS:  Blood pressure 128/52, heart rate is 96, temperature 101.5, respirations 16, oxygen saturation is 97% on 2 liters nasal cannula. HEAD, EARS, NOSE, AND THROAT:  Pupils equal, reacting to light.  Some jaundice.  No jugular venous distention. RESPIRATORY:  Good air entry bilaterally with no rhonchi, rales, or wheezes. CARDIOVASCULAR:  First and second heart sounds heard.  No murmurs, rubs, or gallops. ABDOMEN:  Soft, nontender.  No palpable organomegaly.  Bowel sounds normal. CNS:  The patient is alert and oriented to person, place, and time with no acute focal neurological deficits. EXTREMITIES:  No pedal edema.  Peripheral pulses are equal.  LABORATORY DATA:  Labs were reviewed, significant for WBC 3.9, hemoglobin 13.5, hematocrit 38.1, platelet count 94.  Urinalysis, nitrites  negative, small leukocytes, moderate urine bilirubin.  Sodium 138, potassium 4.2, BUN 4, creatinine 0.72, glucose 103, total protein 6.4, albumin 2.5, AST 192, ALT 59, alkaline phosphatase 81, total bilirubin 9.2.  CT abdomen and pelvis was described above.  IMPRESSION:  Young 25 year old female with autoimmune hepatitis history with liver cirrhosis and esophageal varices, who is presenting with nausea, vomiting, diarrhea, fever, and worsening of hepatitis picture with possible obstructive element given elevation of total bilirubin and alkaline phosphatase.  She has urinary tract  infection, which could be causing fever.  The other possibility would be acute cholecystitis and element of cocaine.  PLAN: 1. Acute on chronic hepatitis.  We will admit the patient to regular     medicine and give IV fluids.  Consult Gastroenterology regarding     her chronic autoimmune hepatitis medications.  Consider further     investigation per Gastroenterology.  The patient will be on folate     and thiamine.  We will give PPI, wonder if she would benefit from     long-term beta-blocker given varices. 2. Urinary tract infection.  Follow urine culture.  Meanwhile, the     patient will be on ceftriaxone. 3. Seizure disorder.  Continue Keppra. 4. 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  We will give vitamin K,     avoid Lovenox and heparin. 5. Cocaine abuse.  We will counsel the patient.     Conley Canal, MD     SR/MEDQ  D:  12/08/2010  T:  12/08/2010  Job:  [id number]  cc:   Dr. Ulyses Southward. Arlyce Dice, MD,FACG [address] [place] [address]  Electronically Signed by Conley Canal  on 12/09/2010 06:59:58 PM

## 2010-12-11 LAB — URINE CULTURE: Colony Count: >=100000

## 2010-12-14 LAB — CULTURE, BLOOD (ROUTINE X 2)
Culture  Setup Time: 201205012344
Culture: NO GROWTH

## 2010-12-16 NOTE — Discharge Summary (Signed)
[name], [name]               ACCOUNT NO.:  [id number]  MEDICAL RECORD NO.:  [id number]           Rojas TYPE:  I  LOCATION:  1304                         FACILITY:  [facility]  PHYSICIAN:  Altha Harm, MDDATE OF BIRTH:  [birth year]  DATE OF ADMISSION:  12/08/2010 DATE OF DISCHARGE:  12/11/2010                              DISCHARGE SUMMARY   DISCHARGE DISPOSITION:  Home.  FINAL DISCHARGE DIAGNOSES: 1. Escherichia coli urinary tract infection. 2. Nausea, vomiting, diarrhea; now resolved. 3. Autoimmune hepatitis. 4. Thrombocytopenia. 5. Coagulopathy. 6. Recreational cocaine use intermittently. 7. Seizure disorder. 8. Migraine headaches. 9. Splenomegaly.  DISCHARGE MEDICATIONS:  Medications at the time of discharge include the following: 1. Folic acid 1 mg p.o. daily. 2. Keflex 500 mg p.o. b.i.d. x2 days. 3. Thiamine 100 mg p.o. daily. 4. Azathioprine 50 mg p.o. daily. 5. Keppra 750 mg p.o. b.i.d. 6. Lasix 20 mg p.o. daily. 7. Promacta 25 mg p.o. daily. 8. Ultram 50 mg p.o. q.8 h p.r.n. pain.  CONSULTANTS:  Dr. Lina Sar, Gastroenterology.  PROCEDURES:  None.  DIAGNOSTIC STUDIES:  CT of abdomen and pelvis with contrast which shows severe changes of cirrhosis.  Multiple varices and portal venous collaterals with splenomegaly.  No ascites.  IMPRESSION:  Slightly thickened walled gallbladder.  No gallstones. Probably due to hyperproteinemia.  IMPRESSION:  Bilateral ovarian follicles with a small amount of free fluid within the pelvis.  PRIMARY CARE PHYSICIAN:  Dr. Jerilee Field  GASTROENTEROLOGIST:  Dr. Arlyce Dice.  HEPATOLOGIST:  [facility].  CODE STATUS:  Full code.  ALLERGIES:  IBUPROFEN.  CHIEF COMPLAINT:  Nausea, vomiting, diarrhea x2 days.  HISTORY OF PRESENT ILLNESS:  Please refer to the H and P by [name] for details of the HPI.  However in short, this is a [name] with a history of autoimmune hepatitis, seizure  disorder, thrombocytopenia, splenomegaly who presents to the emergency room with complaints of nausea, vomiting, abdominal pain and diarrhea x1 day.  The Rojas was also noted to have a fever greater than 101 in the emergency room.  [facility] was consulted for further evaluation and management. 1. Urinary tract infection.  The Rojas's urinalysis was sent off and     findings were suggestive of urinary tract infection.  The culture     revealed E coli pansensitive.  The Rojas has been treated with     Rocephin for a total of 5 days and she is to complete Keflex for a     total 2 days for total of 7 days treatment and the Rojas is     immunocompromised. 2. Autoimmune hepatitis.  The Rojas was seen by Dr. Lina Sar who     felt that there was no change to her therapy at this time and she     is to continue on her current therapy. 3. Thrombocytopenia.  The Rojas has thrombocytopenia which may be     related to her cirrhosis associated with autoimmune hepatitis     versus an independent ITP.  Nonetheless, the Rojas is followed by     the hepatology group that [facility] and is  on Promacta.  She is continued     on her Promacta at this time. 4. Coagulopathy, felt to be secondary to her liver failure.  INR at     the time of discharge is 2.14. 5. Cocaine use.  The Rojas had had in the past intermittent use of     cocaine and had been without any use for over a year.  The Rojas     had one incident of cocaine use prior to coming to the hospital.     The Rojas has been referred to a recovery program in the     outpatient setting and the number has been provided to [facility]  The Rojas is to follow up by calling the recovery program     to set up her outpatient therapy.  CONDITION ON DISCHARGE:  At the time of discharge the Rojas is stable.  PHYSICAL EXAMINATION:  Physical examination is as follows. VITAL SIGNS:  Temperature is 98.3, heart rate 66, blood  pressure 123/75, respiratory rate 16, O2 sats are 100% on room air. GENERAL:  In general, the Rojas appears chronically ill with features of hypoalbuminemia. HEENT EXAMINATION:  She is normocephalic, atraumatic.  Pupils equally round and reactive to light and accommodation.  Extraocular movements are intact.  Oropharynx is moist.  No exudate, erythema or lesions are noted. NECK EXAMINATION:  Trachea is midline.  No masses, no thyromegaly, no JVD, no carotid bruit. LUNGS:  Clear to auscultation.  No wheezing or rhonchi noted. CARDIOVASCULAR:  She has got normal S1 and S2.  No murmurs, rubs or gallops noted. ABDOMEN:  Obese, soft, nontender.  She does have noted splenomegaly and hepatomegaly. EXTREMITIES:  Showed trace to 1+ edema. PSYCHIATRIC:  She is alert and oriented x3.  Good insight and cognition. Good recent and remote recall. NEUROLOGIC:  The Rojas has no focal neurological deficits.  DIETARY RESTRICTIONS:  The Rojas should be on a 2 g sodium, protein restricted diet.  PHYSICAL RESTRICTIONS:  ACTIVITY:  As tolerated.  FOLLOWUP:  The Rojas is to follow up with her hepatology within 1 week.  She is to follow up with Dr. Arlyce Dice in the office also within a week.  She is to follow up with her primary care physician as needed and with Dr. Truett Perna within 1 week.  Total time for this discharge process including face-to-face time, 35 minutes approximately.     Altha Harm, MD     MAM/MEDQ  D:  12/11/2010  T:  12/11/2010  Job:  [id number]  cc:   Dr. Ulyses Southward. Arlyce Dice, MD,FACG [address] [place] [address]  Electronically Signed by Marthann Schiller MD on 12/16/2010 02:25:39 PM

## 2010-12-22 NOTE — Procedures (Signed)
REFERRING PHYSICIAN:  Levert Feinstein, MD   EEG NUMBER:  [id number].   CLINICAL HISTORY:  A 24 year old woman with new-onset seizure and  confusion.  EEG is performed for evaluation.   DESCRIPTION:  The dominant waking rhythm of this tracing is seen only in  brief segments of wakefulness and appears to be a 8-9 Hz alpha rhythm,  which predominates posteriorly when the patient is fully aroused.  Rest  of the recording consists of diffuse and poorly modulated theta/delta  rhythms of 3-7 Hz, punctuated by intermittent runs of alpha activity of  12-15 Hz, all of which appear without persistent focal slowing or  abnormal asymmetry.  No epileptiform discharges are seen.  No  architecture of sleep is noted.  Photic stimulation and hyperventilation  were not performed.  Single channel devoted to EKG revealed sinus rhythm  throughout with a rate of approximately 90 beats per minute.   CONCLUSIONS:  Abnormal study due to the presence of diffuse slowing and  disorganization of the background rhythms, findings suggestive of  diffuse widespread cerebral dysfunction consistent with a drowsy or  mildly encephalopathic state.  However, brief waking rhythms, when seen,  were fairly normal.  No epileptiform discharge was seen.      Michael L. Thad Ranger, M.D.  Electronically Signed     [id number]  D:  02/27/2008 18:06:53  T:  02/28/2008 10:05:20  Job #:  [id number]

## 2010-12-22 NOTE — Consult Note (Signed)
[name], Rojas               ACCOUNT NO.:  [id number]   MEDICAL RECORD NO.:  [id number]          PATIENT TYPE:  INP   LOCATION:  3104                         FACILITY:  [facility]   PHYSICIAN:  Stefani Dama, M.D.  DATE OF BIRTH:  April 09, 1986   DATE OF CONSULTATION:  02/25/2008  DATE OF DISCHARGE:                                 CONSULTATION   REQUESTING PHYSICIAN:  Nena Polio, MD   REASON FOR REQUEST:  Question subarachnoid hemorrhage.   HISTORY OF PRESENT ILLNESS:  [name] is a 25 year old right-  handed black female who has a complicated history of liver disease,  platelet abnormality requiring significant treatment for  thrombocytopenia and history of a cerebrovascular accident.  There is a  history of a known aneurysm that was given when the patient presented  to the emergency room with a seizure and subsequent fall believed to  occur in that order.  She required intubation while in the emergency  department.  I was asked to see the patient regarding the history and  management of her subarachnoid hemorrhage.  A CT scan at the time of  admission showed a small amount of subcortical subarachnoid blood and  question of some slight abnormality in the prepontine cistern.  There  was some minimal amount of blood over the right parietal convexity.  The  patient also had evidence of old cerebrovascular accident in the  posterior cerebral artery distribution in the left occipital pole.  The  past medical history of thrombocytopenia, has apparently been treated by  Dr. Domingo Sep here, however, she has been seen in extensive consultation  at the Hematology and Oncology Department at [facility] [place].  Because of the complexity of the history, I  contacted neurosurgical resident at [facility] to find some more history.  I was able to ascertain that the  patient had apparently been hospitalized from January through  February  with repeated CT scans that initially showed a hemorrhagic stroke in the  occipital pole with an initial MRI that was suspicious for perhaps an  underlying arteriovenous malformation.  Subsequent studies showed  resolution of the hemorrhage.  On February 05, 2007, she underwent an MRI  which showed no definitive evidence for an arteriovenous malformation  but an enlarged left posterior cerebral artery.  The patient was on the  medical oncology service with history of chronic ITP.  On admission  here, her platelet count is noted be 39,000.   Her current physical exam reveals that she is intubated.  She is mildly  sedated.  She responds to pain, opens her eyes, and pupils are 2 mm  equal and briskly reactive.  There is a gag on the ventilator.  There is  5-mm left frontal abrasion and subgaleal hematoma that appears to be  traumatic.   IMPRESSION:  On the basis of the findings and the information, I was  able to ascertain.  I do not believe the patient has had an aneurysmal  subarachnoid hemorrhage.  The CT and MRI findings of the patient show  an  enlarged left posterior cerebral artery and she did have a hemorrhagic  stroke.  Apparently, she is to have further workup at [facility]  [facility] to include catheter angiography, however, she is  also undergoing active treatment for thrombocytopenia.  At this point, I  do not see an acute stroke neurosurgical process.  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  A  tox screen at the  time of admission showed only marijuana in her system.  A possible  consideration for treatment may include transfer to [facility] if she is able to be weaned from the ventilator.  Repeat CT scan performed this morning demonstrates that the subarachnoid  blood in the parietal convexity has resolved itself and the prepontine  abnormalities are not evident.  I believe that the patient's  event was  purely traumatic at this time.  Attention should be given to the  treatment of her thrombocytopenia.  Also, I defer this to the neurology  and medical service or remain available for consultation should the need  arise.      Stefani Dama, M.D.  Electronically Signed     HJE/MEDQ  D:  02/25/2008  T:  02/26/2008  Job:  [id number]

## 2010-12-22 NOTE — Consult Note (Signed)
NAMEHEDWIG, [name]               ACCOUNT NO.:  [id number]   MEDICAL RECORD NO.:  [id number]          PATIENT TYPE:  INP   LOCATION:  3104                         FACILITY:  [facility]   PHYSICIAN:  Samul Dada, M.D.DATE OF BIRTH:  [birth year]   DATE OF CONSULTATION:  02/25/2008  DATE OF DISCHARGE:                                 CONSULTATION   HISTORY:  Mr. [name] is a 25 year old African American female  whom I am asked to see in consultation by Dr. Lanora Manis Deterding for  evaluation of thrombocytopenia.  The patient is currently in the Neuro  ICU and intubated after experiencing a seizure, which resulted in a fall  causing her to strike her left forehead area and resulting in hematoma.  The patient was admitted late in the evening of February 24, 2008.  Her  history is notable for a diagnosis of autoimmune hepatitis associated  with cirrhosis.  This diagnosis was first made when the patient was  either 27 or 25 years old.  She has been followed throughout this time  at [facility] recently by Dr. Woodfin Ganja.  Her hematologist has  been Dr. Kerry Dory.  The patient is known to have cirrhosis, portal  hypertension, splenomegaly, and hypersplenism.  The results have been a  question of whether she has some immune component to her  thrombocytopenia, although I am not sure whether this has been proven  beyond doubt.  The patient was first seen by Dr. Rolm Baptise in  consultation during an admission in late January 2009, when the patient  was admitted with left occipital intracerebral hemorrhage.  During that  admission, the patient received a lot of platelet transfusions, FFP.  She apparently also received IVIG as treatment for the possibility of  immune thrombocytopenia without improvement.  Following her discharge  from the hospital, she received weekly Rituxan.  I am not sure whether,  she had any response to that and ultimately was started on Promacta 50  mg daily on  October 05, 2007.  The patient also was on Imuran 50 mg  daily for her autoimmune hepatitis.  She had been seen a couple of times  by Dr. Truett Perna, but her most recent visit to our office was on November 30, 2007.  Her platelet count at that time was 38,000.  She seemed to be  running in the 40 to 60,000 range.  The highest platelet count I was  able to see was on November 02, 2007, when her platelet count was 66,000.  On November 16, 2007, her platelet count was 47,000; on November 23, 2007,  42,000; and on November 30, 2007, 38,000.  The patient apparently was asked  to follow up.  She failed to keep several appointments and has not seen  Dr. Truett Perna since November 30, 2007.   As stated above, the patient's current admission was due to a seizure  and then she apparently fell striking her left forehead.  The patient  was admitted by Critical Care Medicine and has been seen in consultation  by Neurology specifically Dr. Vickey Huger and Dr. Sherilyn Cooter  Elsner from  Neurosurgery.  The patient's initial platelet count was 39,000 with a  protime of 18.3 and an INR of 1.5.  PTT of 33 seconds.  Also of interest  to [place], had an ammonia level of 90, elevated liver function test with  bilirubin of 4.8, alkaline phosphatase 674, SGOT 122, SGPT 62, albumin  of 3.1.  The ammonia level was 90.  Serum alcohol was undetectable.  However, the urine drug screen showed benzodiazepines and  tetrahydrocannabinol.  Pregnancy test was negative.  CBC obtained this  afternoon at approximately 3:30 p.m. revealed the platelet count of  12,000.  Hemoglobin was 12.3, hematocrit 35.2, and white count 3.3.  We  are asked to see the patient with regard to her thrombocytopenia.   PAST MEDICAL HISTORY:  As above.  In addition to the autoimmune  hepatitis, cirrhosis, splenomegaly, and hypersplenism as well as  thrombocytopenia, the patient also has esophageal varices, I believe she  has had banding.  She has evidence of a left parieto-occipital   intracerebral hemorrhage from September 04, 2007, with resulting  encephalomalacia as seen on the CT scan carried out on this admission,  which by the way was done without IV contrast showed the old left  posterior cerebral artery territory infarct with left occipital brain  substance loss.  There was a large left frontal scalp hematoma without  underlying skull fracture.  A question was raised of whether the patient  may have a very subtle acute subarachnoid hemorrhage.   ALLERGIES:  The patient is allergic to MOTRIN.   MEDICATIONS:  1. Hydroxyzine 25 mg twice a day.  2. Furosemide 20 mg daily.  3. Prilosec 20 mg daily.  4. Oxycodone as needed.  5. Azathioprine 50 mg daily.  6. Magnesium oxide 400 mg daily.  7. Promacta 50 mg daily, although the patient indicated that she may      have not been taking this recently.  8. Norvasc 10 mg daily.  9. Melatonin 300 mg twice a day.   FAMILY HISTORY:  From the patient's grandmother, no history of liver  disease or blood disorder specifically thrombocytopenia.   SOCIAL HISTORY:  The patient smokes and occasionally drinks alcohol.  She lives with her grandmother in [place].  She works at a [employer].  Majority of her medical care is at [facility].   REVIEW OF SYSTEMS:  Cannot be obtained.   PHYSICAL EXAMINATION:  As stated, the patient is intubated and sedated.  She seemed to indicate that by shaking her head that she had not been on  the Promacta recently.  The patient's vital signs are stable.  She has a  rather large hematoma in the left frontal region with some peripheral to  the left eye.  Pupillary and extraocular movements are normal.  The  patient does appear to be jaundiced with scleral icterus.  She has a  Foley catheter in, which she is draining clear urine.  Really, has no  evidence for any other bruising or petechiae or hemorrhagic  manifestations.  The patient also has an NG tube in place.   LABORATORY DATA:  As  described above.  CT scan is also described above.  The patient had repeat CT scan carried out at 11:30 this morning  comparing it with the original and there was no acute changes.  Again,  the left frontal scalp hematoma was noted.   IMPRESSION AND PLAN:  The patient has well established history of  autoimmune hepatitis,  cirrhosis, portal hypertension, and  thrombocytopenia.  Apparently, she is on the transplant list at [facility] where she is followed most closely.  The etiology of her  thrombocytopenia was felt to be most likely due to her cirrhosis and  hypersplenism, however, the possibility of an immune component has been  raised and cannot be excluded.  The patient apparently has had some  response to [facility] and her baseline platelet count has tended to run  around 40,000, which was her platelet count on admission.  We are called  to see her because of her drop in her platelet count not associated with  any changing clinical status or bleeding manifestations.  I have spoken  with Dr. Truett Perna who has supplemented the history and will see the  patient in the morning.  We will go ahead and check a DIC panel.  I will  plan to look at the peripheral smear, which is not currently available.  Simplest intervention here is to simply give the patient platelet  transfusion, which will be given as soon as platelets can be obtained.  We will check a 1-hour post transfusion platelet count.  If the patient  fails to get a good bump from this, we may want to give her repeated  transfusions or consider continuous infusion of platelets.  The patient  will also get vitamin K for her  prolonged protime.  This has been noted previously.  Her PTT was  initially normal.  I do not think she needs FFP at the present time.  Her clinical condition seems to be stable.  Consideration is being given  to transfer to [facility] where she was transferred following her  admission in January.  At  the present time, she is receiving Keppra.      Samul Dada, M.D.  Electronically Signed     DSM/MEDQ  D:  02/25/2008  T:  02/26/2008  Job:  [id number]   cc:   Leighton Roach. Truett Perna, M.D.
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
## 2010-12-22 NOTE — H&P (Signed)
[name], [name]               ACCOUNT NO.:  [id number]   MEDICAL RECORD NO.:  [id number]          PATIENT TYPE:  INP   LOCATION:  1823                         FACILITY:  [facility]   PHYSICIAN:  Coralyn Helling, MD        DATE OF BIRTH:  06-11-1986   DATE OF ADMISSION:  02/24/2008  DATE OF DISCHARGE:                              HISTORY & PHYSICAL   ADMITTING DIAGNOSIS:  New onset seizures.   [name] is a 25 year old female who was in her usual state of health  and apparently was returning from work when she was reported to have had  several seizures.  During the first seizure, she fell to the ground and  hit her head.  The exact description of her seizure is unavailable at  this time.  She was brought to the emergency room and due to a decrease  in the mental status, she was intubated.  CT scan of the head showed the  possibility of a subtle subarachnoid hemorrhage, but no acute infarcts.  CT scan of the neck was negative for any acute disease process.   PAST MEDICAL HISTORY:  Significant for:  1. Autoimmune hepatitis with cirrhosis, first diagnosed at the age of      26.  2. Splenomegaly.  3. Thrombocytopenia.  4. Esophageal varices.  5. Left parieto-occipital intracerebral hemorrhage in September 04, 2007.   Her allergies are to [facility].   Her outpatient medications include azathioprine 50 mg daily and Lasix.   SOCIAL HISTORY:  She lives with her grandmother in [place].  She  occasionally smokes cigarettes and occasionally drinks alcohol.  There  is no reported history of drug abuse.  She works at [employer].  She gets the majority of her medical care at [facility].  She does see Dr. Domingo Sep in [place] for her thrombocytopenia.   PHYSICAL EXAMINATION:  GENERAL:  She was seen in the emergency room.  She was intubated, sedated, and afebrile.  VITAL SIGNS:  Blood pressure is 136/78, heart rate is 78, respiratory  rate is 15, and oxygen saturation  100%.  HEENT:  Pupils are reactive.  She has some scleral jaundice.  There is  clear nasal discharge.  She has a oral endotracheal tube in place.  NECK:  There was no jugular venous distention.  There is no  lymphadenopathy.  HEART:  S1 and S2.  No murmur.  CHEST:  She had coarse breath sounds, but no wheezing or rales.  ABDOMEN:  Soft and nontender.  Positive bowel sounds.  GU:  There is no obvious lesions.  EXTREMITIES:  She is jaundiced.  Peripheral pulses are palpable.  There  is no peripheral edema.  She does have a hematoma over her forehead, but  no other signs of trauma.   Chest x-ray shows endotracheal tube in good position with cardiomegaly.  CT scan of the head shows an old left parieto-occipital infarct with  encephalomalacia; pH 7.32, pCO2 of 55, and pO2 is 388, hemoglobin 15.5  and hematocrit of 45.1, WBC is 6.3, and platelet  count is 39.  Sodium  136, potassium 4.4, chloride is 103, CO2 is 19, BUN is 5, creatinine  0.8, glucose 142, and calcium 7.8.  Alcohol level was less than 5.  Troponin is less than 0.05.   IMPRESSION:  1. New onset seizures with a previous history of intracerebral      hemorrhage and subarachnoid hemorrhage.  I have asked for      neurologic evaluation and there is a recommendation to start her      initially on fosphenytoin.  She apparently has been evaluated      recently at the [facility] for her previous history of      intracerebral hemorrhage and who was actually due to see a      neurosurgeon in the near future.  She would likely need to have an      EEG and followup imaging studies of her brain.  Depending upon the      results of this, neurosurgical consultation may be needed in      addition, it may be in the best interest of the patient when she is      clinically stable to arrange for a transport to [facility].  2. Acute respiratory failure secondary to new onset seizures with      change in mental status and concern for  protection of airway.  I      would follow up on her chest x-ray and arterial blood gas.  I will      leave her on ventilatory support until her neurologic process is      more stable.  3. Autoimmune hepatitis with cirrhosis.  I will check her ammonia      level.  I will continue her on Imuran.  Again,  she is followed at      [facility] for this and apparently has been listed on the      transplant list.  4. Splenomegaly with thrombocytopenia and secondary to her autoimmune      hepatitis.  She does see Dr. Domingo Sep for her thrombocytopenia.  If      there are any issues as far as her having bleeding, then there may      be necessity to consult Hematology for further assistance.  5. Esophageal varices secondary to her autoimmune hepatitis.  There      does not appear to be any evidence for gastrointestinal bleeding at      the present time.      Coralyn Helling, MD  Electronically Signed     VS/MEDQ  D:  02/25/2008  T:  02/25/2008  Job:  [id number]

## 2010-12-22 NOTE — H&P (Signed)
[name], Rojas               ACCOUNT NO.:  [id number]   MEDICAL RECORD NO.:  [id number]          PATIENT TYPE:  EMS   LOCATION:  ED                           FACILITY:  [facility]   PHYSICIAN:  Massie Maroon, MD        DATE OF BIRTH:  10-08-1985   DATE OF ADMISSION:  03/15/2009  DATE OF DISCHARGE:                              HISTORY & PHYSICAL   CHIEF COMPLAINT:  Nausea, vomiting.   HISTORY OF PRESENT ILLNESS:  A 25 year old female with a history of  apparently autoimmune hepatitis ?, cirrhosis, esophageal varices,  splenomegaly, thrombocytopenia complains of nausea and vomiting starting  today.  She apparently had some alcohol to drink last night.  Her  alcohol level was 128 in the ED.  She obviously has had alcohol more  recently than that.  The patient states the pain is sharp, intermittent,  periumbilical, lasting 3-4 minutes at a time.  It occurred after she had  nausea and vomiting.  The patient also notes that she has had some  diarrhea x3 which she describes as loose stool.  She admits to not  taking her lactulose recently.  The patient denies any fever, chills,  hematemesis, constipation, bright red blood per rectum, black stool,  heartburn.  The patient states that she had 7-8 episodes of nausea and  vomiting today.  That is why she came to the ED.  The patient will be  admitted for nausea and vomiting.  Of note, her ammonia level was mildly  elevated at 63.  We have no baseline with which to compare to.  Her AST  was 236, ALT 90, alkaline phosphatase 676, total bilirubin 4.5.  Her  creatinine was within normal limits.  She has some evidence of synthetic  dysfunction with her albumin being low with 2.8.  Her INR was 1.4 and  her PTT previously was actually within normal limits at 36 on Jan 05, 2009.  White count was within normal limits; however, MCV was elevated  at 106 consistent with her liver disease.  Her platelet count was 88.  It appears to be relatively stable  compared to Jan 05, 2009 where it was  47.  The patient will be, as stated above, admitted for nausea and  vomiting.   PAST MEDICAL HISTORY:  1. Autoimmune hepatitis?  2. Cirrhosis of the liver.  3. Esophageal varices.  4. Splenomegaly.  5. Thrombocytopenia.  6. Macrocytosis.  7. History of CVA, ? cerebral hemorrhage.  8. 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      An underlying vascular lesion or mass is not excluded, probable      extension to the posterior horn of the left lateral ventricle,      suspected subdural extension of blood as well).   PAST SURGICAL HISTORY:  None.   SOCIAL HISTORY:  The patient admits to drinking last night apparently  one shot of vodka or some other form of alcohol.  She denies any tobacco  use.  She lives with her grandmother.  She is not working.  She is  unemployed.   FAMILY HISTORY:  None, specifically no liver problems.   ALLERGIES:  IBUPROFEN.   MEDICATIONS:  1. Promacta 50 mg p.o. daily.  2. Lasix 20 mg p.o. daily.  3. Hydroxyzine 25 mg p.o. daily.  4. Azathioprine 50 mg p.o. daily.  5. Omeprazole 20 mg p.o. daily.  6. Keppra 750 mg p.o. daily (she is supposed to be taking this b.i.d.)  7. Vicodin 5/500 mg p.o. p.r.n.  8. Lactulose 30 mL p.o. one time per day.   REVIEW OF SYSTEMS:  Negative for all 10 organ systems except for  pertinent positives stated above.  Specifically, no headache, no  numbness, no tingling, no weakness.   PHYSICAL EXAMINATION:  VITAL SIGNS:  Temperature 97.3, pulse 75,  respiratory 20, blood pressure 117/59, pulse oximetry 98% on room air.  HEENT:  Icteric, EOMI, no nystagmus, pupils 1.5 mm, symmetric, direct,  consensual, near reflex intact.  Mucous membranes moist.  Tongue  midline.  NECK:  No JVD, no bruit, no thyromegaly, no  adenopathy.  HEART:  Regular rate and rhythm.  S1, S2, no murmurs, gallops or rubs.  LUNGS:  Clear to auscultation bilaterally.  ABDOMEN:  Soft, nontender, nondistended.  Positive bowel sounds.  EXTREMITIES:  No cyanosis, clubbing or edema.  SKIN:  Positive tattoo.  LYMPH NODES:  No adenopathy.  NEUROLOGIC:  Nonfocal, cranial nerves II-XII intact, reflexes 2+,  symmetric, diffuse with downgoing toes bilaterally, motor strength 5/5  in all 4 extremities, pinprick intact.   Urinalysis 3-6 WBCs, nitrite negative, leukocyte esterase small.  Urine  pregnancy test negative.  Ammonia 63, lipase 17.   Sodium 140, potassium 3.5, chloride 111, bicarb 18, BUN 7, creatinine  0.57, AST 236, ALT 90, alkaline phosphatase 676, total bilirubin 4.5,  albumin 2.8, calcium 8.1.  Alcohol level 128.  INR 1.4.  WBC 8.0,  hemoglobin 14.1, platelet count 88, MCV 106, RDW 16.5.   ASSESSMENT/PLAN:  1. Nausea, vomiting, possibly secondary to gastritis:  We will use      Protonix 40 mg p.o. b.i.d. rather than omeprazole 20 mg p.o. daily.      We will try to use Zofran and Phenergan as needed to control her      nausea, vomiting.  If her nausea and vomiting continues, then      please call GI consult for EGD.  2. Abnormal liver function tests:  The patient has history of      apparently autoimmune hepatitis.  I have no lab verification to      verify this in terms of positive ANA and anti-smooth muscle      antibody.  Her alkaline phosphatase is relatively high and this      would be actually more consistent with primary biliary cirrhosis.      However, there is no AMA or antimitochondrial antibody in the      computer system as well.  We will continue to use lactulose 30 mL      p.o. daily for apparently prevention of hepatic encephalopathy.  I      am not sure if she has had hepatic encephalopathy in the past.  The      patient will also be continued on azathioprine 50 mg p.o. daily      along with Lasix 20  mg p.o.  daily.  We will use Protonix 40 mg p.o.      b.i.d. for GI prophylaxis.  The patient is presently on Vicodin for      pain.  I have asked her to ask her GI physician about the presence      of Tylenol in the Vicodin and whether she should be on a different      pain medication.  3. History of esophageal varices per emergency department report:      Once again I am unable to verify that she does indeed have      esophageal varices.  4. Splenomegaly/thrombocytopenia:  The patient is under the care of      Dr. Truett Perna.  She has apparently been treated with chemotherapy      last year.  She is presently on Promacta for her thrombocytopenia.      Her platelet counts appear stable.  This is good as she has had      obviously evidence of bleeding with cerebral hemorrhage in the      recent past.  5. Seizure disorder:  As a result of cerebral hemorrhage.  The patient      be continued on Keppra 750 mg p.o. daily.  I am not sure why she is      not taking it b.i.d. other than noncompliance.  If she is unwilling      to take it b.i.d., we will continue her 750 mg p.o. daily.  6. Deep venous thrombosis prophylaxis:  SCDs and TEDs since she has      had prior evidence of cerebral hemorrhage in the past.      Massie Maroon, MD  Electronically Signed     JYK/MEDQ  D:  03/15/2009  T:  03/15/2009  Job:  [id number]   cc:   Dr. __________  Gastroenterologist, [facility] [place] B. Truett Perna, M.D.  Fax: [phone]

## 2010-12-22 NOTE — Discharge Summary (Signed)
NAMECRISTLE, JARED               ACCOUNT NO.:  [id number]   MEDICAL RECORD NO.:  [id number]          PATIENT TYPE:  INP   LOCATION:  3003                         FACILITY:  [facility]   PHYSICIAN:  Casimiro Needle B. Sherene Sires, MD, FCCPDATE OF BIRTH:  [birth year]   DATE OF ADMISSION:  02/24/2008  DATE OF DISCHARGE:  02/28/2008                               DISCHARGE SUMMARY   DISCHARGE DIAGNOSES:  1. Ventilator-dependent respiratory failure, secondary to new onset of      seizures, resolved.  2. New onset of seizures, evaluated by [facility] and      [facility].  3. 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   HPI:  [name] is a 25 year old female with the history of  autoimmune liver disease, as well as brain aneurysm, diagnosed in  January 2009, following an intracerebral hemorrhage.  The patient has  been seeing a neurosurgeon at [facility], although she cannot tell us the name,  and was supposed to follow up with him on March 08, 2008.  She was in her  normal state of health when she was at work and developed a seizure.  She was witnessed to have a seizure by a friend.  She fell to the  ground.  She did sustain an injury, a left periorbital injury.  She was  admitted for further evaluation and treatment and required orotracheal  intubation and mechanical ventilatory support.   PAST MEDICAL HISTORY:  Significant for a brain aneurysm of uncertain  location, diagnosed in 2009.  Autoimmune hepatitis, subsequently  cirrhosis, followed at [facility].  She also has a history of esophageal  varices and chronic thrombocytopenia.   LABORATORY DATA:  Hemoglobin 11.9, hematocrit 33.5, platelets are 15,000  with a peak of 16,000.  WBC is 3.  Sodium 140, potassium 3.3, chloride  111, CO2 21, BUN 4, creatinine 0.54, glucose 69.  AST is 122, ALT is 62,  alkaline phosphatase 674, total bilirubin 4.8,  albumin 3.1, calcium was  8.1, magnesium 1.6, phosphorus 2.7.  Ammonia level was 45.  DIC profile  demonstrated INR 1.6, PTT of 36, platelet count of 11 with no  schistocytes.   RADIOGRAPHIC DATA:  Chest x-ray demonstrates bibasilar air space  disease, mild, greater on the left.  Endotracheal tube was in place.  Trace pleural effusion.  The endotracheal tube has since been removed.  CT of the head without contrast:  No acute intracranial abnormalities.  Left frontal scalp hematoma is noted.  CT of the spine demonstrates old  left considerable brain substance loss, no mass effect.  Left frontal  scalp hematoma.  Underlying skull intact.  No acute cervical injury is  noted.   </HOSPITAL COURSE BY   DISCHARGE DIAGNOSES:  1. Vent-dependent respiratory failure, secondary to new onset of      seizures.  She was intubated on July 19 and subsequently extubated      on February 26, 2008.  Her vent-dependent respiratory failure has      completely resolved.  2. New onset  of seizures.  This was evaluated by Dr. Terrace Arabia of [facility] and Dr. Barnett Abu of [facility].  She was placed on Keppra 750 mg b.i.d.  CT scans      were reviewed.  She is to follow up with Dr. Terrace Arabia in one to two      months and that has been called and scheduled for her.  3. Chronic cirrhosis, secondary to autoimmune hepatitis with chronic      thrombocytopenia.  She has been on Promacta per Dr. Piedad Climes at [facility].  She was evaluated by Dr. Truett Perna this admission.      She will continue to follow with Dr. Piedad Climes and continue her      Promacta.  4. Left periorbital hematoma.  This was evaluated and will be      monitored on an outpatient basis.   DISCHARGE MEDICATIONS:  1. Hydroxyzine 25 mg b.i.d.  2. Furosemide 20 mg once a day.  3. Omeprazole 20 mg once a day.  4. Oxycodone 5 mg one tab four times a day p.r.n.  5. Note that Imuran has been stopped.  6. Mag-Ox 40 mg  one a day.  7. Promacta 50 mg one a day.  8. Amlodipine besylate has been stopped, 10 mg tablet.  9. Melatonin 300 mg times two for sleep once a day.  10.She is on Keppra 750 mg two times a day by mouth.   DIET:  As instructed.   FOLLOWUP:  She will follow up with Dr. Truett Perna in the office.  His  office will call her.  She is to follow up with Dr. Terrace Arabia at [facility] in one months.  She has an appointment to see a  neurosurgeon at [facility].  His name is not available.  She is to  follow up with Dr. Piedad Climes at [facility].   DISCHARGE DISPOSITION:  Improved.   FOLLOWUP:  She will follow up with the aforementioned physicians.      Devra Dopp, MSN, ACNP      Charlaine Dalton. Sherene Sires, MD, [facility]  Electronically Signed    SM/MEDQ  D:  02/28/2008  T:  02/28/2008  Job:  [id number]   cc:   Hematology, [facility] Kerry Dory, M.D.  Stefani Dama, M.D.  Levert Feinstein, MD  Leighton Roach. Truett Perna, M.D.

## 2010-12-22 NOTE — Consult Note (Signed)
[name], [name]               ACCOUNT NO.:  [id number]   MEDICAL RECORD NO.:  [id number]          PATIENT TYPE:  INP   LOCATION:  1823                         FACILITY:  [facility]   PHYSICIAN:  Nena Polio, MD DATE OF BIRTH:  1985-10-30   DATE OF CONSULTATION:  DATE OF DISCHARGE:                                 CONSULTATION   The patient also has an alternative medical record number [id number].   REQUESTING PHYSICIAN:  Zadie Rhine, MD, in the emergency room.   REASON FOR CONSULTATION:  Is seen in consultation at the request of Dr.  Bebe Shaggy for further evaluation of seizure and status epilepticus.   HISTORY OF PRESENT ILLNESS:  The patient is a 25 year old female who has  a history of autoimmune liver disease as well as a brain aneurysm that  was diagnosed in January 2009 following intracerebral hemorrhage.  The  patient has been seeing a neurosurgeon at [facility] and was supposed to follow  up with him within the next couple weeks.  Unfortunately, today the  patient was at work and in her normal state of health.  She went out to  her car and was witnessed by a friend to have a seizure.  She fell to  the ground.  No other details of this seizure are known at this time.  The patient was brought by EMS to the emergency room.  She had not  regained her full level of consciousness and was then sedated and  intubated.  The patient has not had any previous seizures and was not on  any antiepileptics at baseline.   The patient's head CT done in the emergency room was reviewed  personally.  There does seem to be hyperdensity in the subarachnoid  spaces over the right hemisphere that I am concerned for a subarachnoid  hemorrhage.  The patient was loaded with fosphenytoin in the emergency  room.   Her home medications include amlodipine, __________, hydroxyzine, Lasix,  omeprazole, Imuran, Mag-Ox and melatonin.   PAST MEDICAL HISTORY:  1. Notable for brain aneurysm of uncertain  location.  This was      diagnosed in January 2009.  At that time she had a left occipital      intracerebral hemorrhage with subsequent right visual loss.  She is      currently being followed at [facility].  2. Autoimmune hepatitis and subsequent cirrhosis, also being followed      at [facility].  She has a history of esophageal varices and chronic      thrombocytopenia.   FAMILY HISTORY AND SOCIAL HISTORY:  The patient currently lives with her  grandmother.  She is working part-time at [employer] but has  disability.  She occasionally uses alcohol but also smokes cigarettes.  Family history is otherwise unremarkable.   REVIEW OF SYSTEMS:  Was unable to be obtained due to the patient being  sedated.  However, her grandmother states that she was in her normal  state of health prior to this evening.   On her exam, this is a young woman who is intubated.  She is  sedated.  She has a significant hematoma over the frontal scalp.  She is afebrile.  Her vital signs are stable.  The patient is in general is coughing and  fighting the tube.  HEART:  Regular with normal S1 and S2.  ABDOMEN:  Soft, nondistended.  EXTREMITIES:  Showed no edema.  She does have some bruising over the face.  On her neurological exam, again, the patient is sedated and the  neurological exam is limited.  Her pupils were midposition and  sluggishly reactive.  She had a positive cough and gag.  Corneals were  intact.  She was moving all extremities.  She did not withdraw to pain.  She does have clonus at the ankles bilaterally and Babinski's were  equivocal.   I reviewed the patient's labs.  Her platelets are low at 39,000 but  otherwise her other labs were unremarkable.   IMPRESSION:  Seizure/status epilepticus.  This may be secondary to an  underlying subarachnoid hemorrhage.  The patient has a history of a  brain aneurysm and previous intracerebral hemorrhage.  Her medical  history is complicated given her history of  autoimmune hepatitis and  chronic thrombocytopenia.   RECOMMENDATIONS:  I agree with admitting this patient to the ICU for  critical monitoring.  I would recommend calling neurosurgery for further  evaluation regarding possible subarachnoid hemorrhage and further  management.  I would recommend switching the patient over to Keppra 750  mg b.i.d. given her known hepatitis and interactions with Dilantin.  This patient may need transfer to [facility] when she is stable since she has  been followed there chronically for her active, challenging medical  problems.  We will continue to follow this patient in the meantime.      Nena Polio, MD  Electronically Signed     DEE/MEDQ  D:  02/25/2008  T:  02/25/2008  Job:  [id number]
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
## 2010-12-22 NOTE — Discharge Summary (Signed)
[name], [name]               ACCOUNT NO.:  [id number]   MEDICAL RECORD NO.:  [id number]          PATIENT TYPE:  INP   LOCATION:  1302                         FACILITY:  [facility]   PHYSICIAN:  Altha Harm, MDDATE OF BIRTH:  07/11/1986   DATE OF ADMISSION:  03/15/2009  DATE OF DISCHARGE:  03/18/2009                               DISCHARGE SUMMARY   DISCHARGE DISPOSITION:  Home.   DISCHARGE DIAGNOSES:  1. Acute gastritis.  2. Autoimmune liver disease, transplant candidate.  3. Seizure disorder.  4. History of hyperammonemia.  5. History of occipital hemorrhage.  6. Cerebral aneurysm.  7. Idiopathic thrombocytopenic purpura.  8. Anemia.   DISCHARGE MEDICATIONS:  1. Prilosec 20 mg p.o. b.i.d.  2. Lasix 40 mg p.o. daily times 1 week then resume 20 mg p.o. daily.  3. Hydroxyzine 25 mg p.o. daily p.r.n. itching.  4. Azathioprine 50 mg p.o. daily.  5. Keppra extended release 750 mg p.o. daily.  6. Vicodin 5/500 one tablet q.6 h p.r.n. pain.  7. Lactulose 30 mL p.o. daily.  8. Promacta50 mg p.o. daily.   CONSULTANTS:  None.   PROCEDURES:  None.   DIAGNOSTIC STUDIES:  Ultrasound of the abdomen completed which shows  findings compatible with hepatic cirrhosis.  Portal venous hypertension  with splenomegaly and numerous abdominal collaterals.  No evidence of  cholelithiasis or acute cholecystitis.  Small amount of left upper  quadrant ascites identified.  Nonvisualization of the pancreas.  Negative for hydronephrosis.   CODE STATUS:  Full Code.   ALLERGIES:  IBUPROFEN.   PHYSICIANS:  1. Primary hepatology is Dr. Rennis Chris at [facility].  2. Primary hematologist, Dr. Truett Perna at [facility].   CHIEF COMPLAINT:  Nausea, vomiting.   HISTORY OF PRESENT ILLNESS:  1. Please refer to the history and physical by Dr. Selena Rojas for details of      the HPI.  However, this is a 25 year old female with a history of      autoimmune hepatitis, cirrhosis,  esophageal varices, splenomegaly,      ITP who came in with complaints of nausea and vomiting.  The      patient was admitted, given bowel rest and had nausea medication.      She was then given a challenge of clear liquids but continued to      have emesis.  She was given further bowel rest and then re-      challenged with clear liquids, which she tolerated.  The patient      was advanced to a 2 grams sodium diet which she tolerated without      any difficulty.  The patient was noted to have elevated liver      enzymes including AST, ALT and alk phos.  However, I obtained her      records from [facility] and in comparison to her records from on March the      hepatic indices are actually improved.  2. Mild dehydration.  The patient had some mild dehydration associated      with nausea, vomiting.  She was rehydrated  but then became fluid      overloaded.  She is given IV Lasix and is being discharged on an      increased dose of Lasix for 1 week and then to resume her usual      dosing of Lasix.  3. Anemia.  The patient had a relative anemia which was attributed to      hemodilution.  I would recommend the patient has her hemoglobin and      hematocrit rechecked in approximately 1 month to ensure that she      has remained stable and if she shows signs and symptoms of      dizziness, weakness or any evidence of bleeding.  4. In terms of her idiopathic thrombocytopenic purpura, the patient is      continued on her promacta.  Her platelet count of the time of      discharge is 50,000.  I have made Dr. Truett Perna aware of this.  The      patient had an appointment to see the hematologist today in the      office.  After discharge, they will see her prior to her going      home.  Otherwise, the patient has remained stable.  5. In light of her gastritis, we have increased her proton pump      inhibitor twice a day.   PHYSICAL EXAMINATION:  VITAL SIGNS:  At the time of discharge the  patient has a  temperature of 98.4, blood pressure of 106/50, heart rate  of 77 a respirations of 18, O2 sats are 98% on room air.  LUNGS:  Clear to auscultation.  CARDIOVASCULAR:  Normal S1 and S2 with no murmurs, rubs or gallops are  noted.  ABDOMEN:  Obese, soft, nontender, nondistended.  She has some  splenomegaly noted.  EXTREMITIES:  The patient had some mild edema in the left upper  extremity at the site of the IV.  However, there is no erythema or  warmth noted.  She has no pedal edema.  LYMPHATICS:  Survey she has got no cervical, axillary, inguinal  lymphadenopathy.   DIETARY RESTRICTIONS:  The patient should be on a 2 grams sodium diet.   PHYSICAL RESTRICTIONS:  Activity as tolerated.   FOLLOW UP:  The patient is to follow up with of her primary  hepatologist, Dr. Piedad Rojas at her appointment already scheduled and will  follow up with her hematologist today after being discharged.   TOTAL TIME FOR DISCHARGE:  50 minutes      Altha Harm, MD  Electronically Signed     MAM/MEDQ  D:  03/18/2009  T:  03/18/2009  Job:  [id number]   cc:   Leighton Roach. Truett Perna, M.D.  Fax: [phone]   Dr. Lestine Mount, [facility]
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
## 2011-01-01 ENCOUNTER — Other Ambulatory Visit: Payer: Self-pay | Admitting: Oncology

## 2011-01-01 ENCOUNTER — Encounter (HOSPITAL_BASED_OUTPATIENT_CLINIC_OR_DEPARTMENT_OTHER): Payer: Medicaid Other | Admitting: Oncology

## 2011-01-01 DIAGNOSIS — D731 Hypersplenism: Secondary | ICD-10-CM

## 2011-01-01 DIAGNOSIS — D6959 Other secondary thrombocytopenia: Secondary | ICD-10-CM

## 2011-01-01 DIAGNOSIS — K754 Autoimmune hepatitis: Secondary | ICD-10-CM

## 2011-01-01 DIAGNOSIS — K7460 Unspecified cirrhosis of liver: Secondary | ICD-10-CM

## 2011-01-01 LAB — CBC WITH DIFFERENTIAL/PLATELET
Basophils Absolute: 0 10*3/uL (ref 0.0–0.1)
EOS%: 1.1 % (ref 0.0–7.0)
HCT: 35.9 % (ref 34.8–46.6)
HGB: 12.4 g/dL (ref 11.6–15.9)
MCH: 36 pg — ABNORMAL HIGH (ref 25.1–34.0)
MONO#: 0.3 10*3/uL (ref 0.1–0.9)
NEUT%: 80.7 % — ABNORMAL HIGH (ref 38.4–76.8)
Platelets: 142 10*3/uL — ABNORMAL LOW (ref 145–400)
lymph#: 0.3 10*3/uL — ABNORMAL LOW (ref 0.9–3.3)

## 2011-01-05 ENCOUNTER — Emergency Department (HOSPITAL_COMMUNITY): Payer: Medicare Other

## 2011-01-05 ENCOUNTER — Emergency Department (HOSPITAL_COMMUNITY)
Admission: EM | Admit: 2011-01-05 | Discharge: 2011-01-05 | Disposition: A | Discharge status: Home / Self Care | Payer: Medicare Other | Attending: Emergency Medicine | Admitting: Emergency Medicine

## 2011-01-05 DIAGNOSIS — G40909 Epilepsy, unspecified, not intractable, without status epilepticus: Secondary | ICD-10-CM | POA: Insufficient documentation

## 2011-01-05 DIAGNOSIS — R748 Abnormal levels of other serum enzymes: Secondary | ICD-10-CM | POA: Insufficient documentation

## 2011-01-05 DIAGNOSIS — Z79899 Other long term (current) drug therapy: Secondary | ICD-10-CM | POA: Insufficient documentation

## 2011-01-05 DIAGNOSIS — R1013 Epigastric pain: Secondary | ICD-10-CM | POA: Insufficient documentation

## 2011-01-05 DIAGNOSIS — R17 Unspecified jaundice: Secondary | ICD-10-CM | POA: Insufficient documentation

## 2011-01-05 DIAGNOSIS — R112 Nausea with vomiting, unspecified: Secondary | ICD-10-CM | POA: Insufficient documentation

## 2011-01-05 DIAGNOSIS — R079 Chest pain, unspecified: Secondary | ICD-10-CM | POA: Insufficient documentation

## 2011-01-05 DIAGNOSIS — K7460 Unspecified cirrhosis of liver: Secondary | ICD-10-CM | POA: Insufficient documentation

## 2011-01-05 LAB — COMPREHENSIVE METABOLIC PANEL
ALT: 92 U/L — ABNORMAL HIGH (ref 0–35)
AST: 225 U/L — ABNORMAL HIGH (ref 0–37)
Alkaline Phosphatase: 1362 U/L — ABNORMAL HIGH (ref 39–117)
CO2: 22 mEq/L (ref 19–32)
Chloride: 104 mEq/L (ref 96–112)
Potassium: 3.5 mEq/L (ref 3.5–5.1)
Sodium: 139 mEq/L (ref 135–145)
Total Bilirubin: 8.1 mg/dL — ABNORMAL HIGH (ref 0.3–1.2)

## 2011-01-05 LAB — LIPASE, BLOOD: Lipase: 16 U/L (ref 11–59)

## 2011-01-05 LAB — CBC
HCT: 35.9 % — ABNORMAL LOW (ref 36.0–46.0)
Hemoglobin: 13.1 g/dL (ref 12.0–15.0)
MCH: 35.7 pg — ABNORMAL HIGH (ref 26.0–34.0)
MCHC: 36.5 g/dL — ABNORMAL HIGH (ref 30.0–36.0)

## 2011-01-06 ENCOUNTER — Emergency Department (HOSPITAL_COMMUNITY): Payer: Medicare Other

## 2011-01-06 ENCOUNTER — Emergency Department (HOSPITAL_COMMUNITY)
Admission: EM | Admit: 2011-01-06 | Discharge: 2011-01-07 | Disposition: A | Discharge status: Home / Self Care | Payer: Medicare Other | Attending: Emergency Medicine | Admitting: Emergency Medicine

## 2011-01-06 DIAGNOSIS — K7460 Unspecified cirrhosis of liver: Secondary | ICD-10-CM | POA: Insufficient documentation

## 2011-01-06 DIAGNOSIS — G40909 Epilepsy, unspecified, not intractable, without status epilepticus: Secondary | ICD-10-CM | POA: Insufficient documentation

## 2011-01-06 DIAGNOSIS — R1013 Epigastric pain: Secondary | ICD-10-CM | POA: Insufficient documentation

## 2011-01-06 DIAGNOSIS — B1920 Unspecified viral hepatitis C without hepatic coma: Secondary | ICD-10-CM | POA: Insufficient documentation

## 2011-01-06 DIAGNOSIS — R112 Nausea with vomiting, unspecified: Secondary | ICD-10-CM | POA: Insufficient documentation

## 2011-01-06 LAB — CBC
MCH: 34.9 pg — ABNORMAL HIGH (ref 26.0–34.0)
MCV: 97.9 fL (ref 78.0–100.0)
Platelets: 138 10*3/uL — ABNORMAL LOW (ref 150–400)
RDW: 16.1 % — ABNORMAL HIGH (ref 11.5–15.5)
WBC: 4 10*3/uL (ref 4.0–10.5)

## 2011-01-06 LAB — DIFFERENTIAL
Basophils Relative: 1 % (ref 0–1)
Eosinophils Absolute: 0 10*3/uL (ref 0.0–0.7)
Eosinophils Relative: 1 % (ref 0–5)
Lymphs Abs: 0.2 10*3/uL — ABNORMAL LOW (ref 0.7–4.0)
Neutrophils Relative %: 89 % — ABNORMAL HIGH (ref 43–77)

## 2011-01-06 LAB — COMPREHENSIVE METABOLIC PANEL
AST: 244 U/L — ABNORMAL HIGH (ref 0–37)
Albumin: 2.4 g/dL — ABNORMAL LOW (ref 3.5–5.2)
BUN: 7 mg/dL (ref 6–23)
Creatinine, Ser: <0.47 mg/dL (ref 0.4–1.2)
Potassium: 3.1 mEq/L — ABNORMAL LOW (ref 3.5–5.1)
Total Protein: 6.1 g/dL (ref 6.0–8.3)

## 2011-01-07 LAB — POCT PREGNANCY, URINE: Preg Test, Ur: NEGATIVE

## 2011-01-08 ENCOUNTER — Observation Stay (HOSPITAL_COMMUNITY)
Admission: EM | Admit: 2011-01-08 | Discharge: 2011-01-09 | Disposition: A | Discharge status: Home / Self Care | Payer: Medicare Other | Attending: Emergency Medicine | Admitting: Emergency Medicine

## 2011-01-08 DIAGNOSIS — K754 Autoimmune hepatitis: Principal | ICD-10-CM | POA: Insufficient documentation

## 2011-01-08 DIAGNOSIS — R112 Nausea with vomiting, unspecified: Secondary | ICD-10-CM | POA: Insufficient documentation

## 2011-01-08 DIAGNOSIS — G40802 Other epilepsy, not intractable, without status epilepticus: Secondary | ICD-10-CM | POA: Insufficient documentation

## 2011-01-08 DIAGNOSIS — B1920 Unspecified viral hepatitis C without hepatic coma: Secondary | ICD-10-CM | POA: Insufficient documentation

## 2011-01-08 DIAGNOSIS — E860 Dehydration: Secondary | ICD-10-CM | POA: Insufficient documentation

## 2011-01-08 DIAGNOSIS — Z79899 Other long term (current) drug therapy: Secondary | ICD-10-CM | POA: Insufficient documentation

## 2011-01-08 DIAGNOSIS — R42 Dizziness and giddiness: Secondary | ICD-10-CM | POA: Insufficient documentation

## 2011-01-08 DIAGNOSIS — K7460 Unspecified cirrhosis of liver: Secondary | ICD-10-CM | POA: Insufficient documentation

## 2011-01-08 LAB — CBC
HCT: 35.1 % — ABNORMAL LOW (ref 36.0–46.0)
Hemoglobin: 12.2 g/dL (ref 12.0–15.0)
MCV: 100 fL (ref 78.0–100.0)
RBC: 3.51 MIL/uL — ABNORMAL LOW (ref 3.87–5.11)
WBC: 3.3 10*3/uL — ABNORMAL LOW (ref 4.0–10.5)

## 2011-01-08 LAB — DIFFERENTIAL
Lymphocytes Relative: 5 % — ABNORMAL LOW (ref 12–46)
Lymphs Abs: 0.2 10*3/uL — ABNORMAL LOW (ref 0.7–4.0)
Monocytes Relative: 7 % (ref 3–12)
Neutrophils Relative %: 88 % — ABNORMAL HIGH (ref 43–77)

## 2011-01-09 LAB — COMPREHENSIVE METABOLIC PANEL
Albumin: 2.3 g/dL — ABNORMAL LOW (ref 3.5–5.2)
Alkaline Phosphatase: 1064 U/L — ABNORMAL HIGH (ref 39–117)
BUN: 5 mg/dL — ABNORMAL LOW (ref 6–23)
Potassium: 3.7 mEq/L (ref 3.5–5.1)
Total Protein: 6 g/dL (ref 6.0–8.3)

## 2011-01-09 LAB — URINALYSIS, ROUTINE W REFLEX MICROSCOPIC
Glucose, UA: NEGATIVE mg/dL
Hgb urine dipstick: NEGATIVE
Nitrite: NEGATIVE
Specific Gravity, Urine: 1.018 (ref 1.005–1.030)
pH: 8.5 — ABNORMAL HIGH (ref 5.0–8.0)

## 2011-01-09 LAB — LIPASE, BLOOD: Lipase: 56 U/L (ref 11–59)

## 2011-01-09 LAB — RAPID URINE DRUG SCREEN, HOSP PERFORMED
Cocaine: NOT DETECTED
Opiates: NOT DETECTED
Tetrahydrocannabinol: POSITIVE — AB

## 2011-01-09 LAB — URINE MICROSCOPIC-ADD ON

## 2011-01-09 LAB — AMMONIA: Ammonia: 48 umol/L (ref 11–60)

## 2011-01-10 LAB — URINE CULTURE: Colony Count: 50000

## 2011-02-11 ENCOUNTER — Encounter: Payer: Self-pay | Admitting: Gastroenterology

## 2011-03-03 ENCOUNTER — Ambulatory Visit: Payer: Medicare Other | Admitting: Family Medicine

## 2011-03-04 ENCOUNTER — Ambulatory Visit: Payer: Medicare Other | Admitting: Family Medicine

## 2011-03-26 ENCOUNTER — Encounter: Payer: Self-pay | Admitting: Family Medicine

## 2011-03-26 ENCOUNTER — Ambulatory Visit (INDEPENDENT_AMBULATORY_CARE_PROVIDER_SITE_OTHER): Payer: Medicare Other | Admitting: Family Medicine

## 2011-03-26 DIAGNOSIS — K7460 Unspecified cirrhosis of liver: Secondary | ICD-10-CM

## 2011-03-26 DIAGNOSIS — R0781 Pleurodynia: Secondary | ICD-10-CM

## 2011-03-26 DIAGNOSIS — R079 Chest pain, unspecified: Secondary | ICD-10-CM

## 2011-03-26 DIAGNOSIS — G40909 Epilepsy, unspecified, not intractable, without status epilepticus: Secondary | ICD-10-CM

## 2011-03-26 DIAGNOSIS — D696 Thrombocytopenia, unspecified: Secondary | ICD-10-CM

## 2011-03-26 LAB — CBC
Platelets: 147 10*3/uL — ABNORMAL LOW (ref 150–400)
RDW: 15.8 % — ABNORMAL HIGH (ref 11.5–15.5)
WBC: 4.6 10*3/uL (ref 4.0–10.5)

## 2011-03-26 LAB — AMMONIA

## 2011-03-26 MED ORDER — OXYCODONE HCL 5 MG PO TABS: 5.0000 mg | ORAL_TABLET | Freq: Three times a day (TID) | ORAL | Status: DC | PRN

## 2011-03-26 NOTE — Assessment & Plan Note (Signed)
Concern encephalopathy may be contributing to mental status "episodes."  Will check ammonia level.  Pt to follow up with [facility] next month.

## 2011-03-26 NOTE — Progress Notes (Signed)
Subjective:    Patient ID: [name], female    DOB: September 15, 1985, 25 y.o.   MRN: [id number]  HPI  Patient presents for right sided rib pain.  It has been hurting for two or three days.  She says it hurts to take a deep breath.  It hurts to move.  The pain is severe and has been keeping her up at night.  She denies that the pain radiates, denies that it is like her previous abdominal pain.  She does not think she fell recently, but says she is clumsy and may have bumped into something.    She is followed at [facility] for her autoimmune hepatitis.  She saw them last month and everything was stable.  She is also seen by oncology for thrombocytopenia and takes medications to assist with platelet production.  She does complain she bruises easily.  Patient states that she has not had any fevers, but says she has chronic nausea that is stable.    During examination patient had episode that she spaced out and stared off, she had some mild hand shaking.  She was able to nod her head to answer yes or no questions but not able to communicate normally.  This episode lasted 30 seconds to 1 minute, then the patient was able to speak again.  She said that she felt dizzy and her vision went black, and that she felt shaky, she had trouble taking a breath, but no real pain.   Review of Systems  Constitutional: Negative for fever and unexpected weight change.  HENT: Negative for rhinorrhea.   Eyes: Positive for visual disturbance.  Respiratory: Negative for shortness of breath.   Cardiovascular: Positive for chest pain.  Gastrointestinal: Positive for nausea. Negative for vomiting, blood in stool and abdominal distention.  Genitourinary: Negative for dysuria and hematuria.  Musculoskeletal: Positive for myalgias. Negative for arthralgias.  Skin: Negative for rash.  Neurological: Positive for dizziness and weakness.  Hematological: Bruises/bleeds easily.       Objective:   Physical Exam  Vitals  reviewed. Constitutional: She has a sickly appearance.  HENT:  Head: Normocephalic.  Eyes: Scleral icterus is present.  Neck: Normal range of motion. No JVD present. No tracheal deviation present. No thyromegaly present.  Cardiovascular: Normal rate, regular rhythm, intact distal pulses and normal pulses.   Pulmonary/Chest: Breath sounds normal. She exhibits tenderness.       Tenderness to palpation on lower right ribs, front, back, and side.   Abdominal: She exhibits no distension and no fluid wave. There is no hepatosplenomegaly. There is no tenderness.  Musculoskeletal:       Trace edema of lower extremities.   Neurological:       Patient appeared alert and oriented with no focal deficits.  Then episode of decreased ability to communicate lasting about 1 minute.  She had some hand shaking but no clonic movements.  She was seated on exam table and upright throughout episode.  No eye movements.   Skin: Bruising noted.       On thighs.           Assessment & Plan:  Rib pain Unclear cause at this time, am concerned for rib fracture vs. Liver engorgement vs. Hemopneumothorax as cause.  No lung findings or abdominal/liver findings on exam today, therefor rib injury most likely.  Will order chest x-ray.   THROMBOCYTOPENIA Will order CBC as as patient is having easy bruising.   HEPATIC CIRRHOSIS, NONALCOHOLIC Concern encephalopathy may be contributing  to mental status "episodes."  Will check ammonia level.  Pt to follow up with [facility] next month.   Seizure disorder Unclear if patient having smalls seizures or if current mental status change episodes due to encephalopathy.  Will ask pt to follow up with her neurology soon.

## 2011-03-26 NOTE — Assessment & Plan Note (Signed)
Will order CBC as as patient is having easy bruising.

## 2011-03-26 NOTE — Patient Instructions (Signed)
I am sorry you are in so much pain, I have written a prescription for pain medication.  We will draw your ammonia level today, and I want to get a chest x-ray to look at your ribs.  You can go to Radiology department at the hospital or to [facility] on [address].  I will let you know the results of your lab tests and x-ray next week.    I want you to call your neurologist for an appointment to see them soon.  Please be sure to tell them about the spells you have been having. Please keep your follow up with your liver doctor in September.

## 2011-03-26 NOTE — Assessment & Plan Note (Signed)
Unclear if patient having smalls seizures or if current mental status change episodes due to encephalopathy.  Will ask pt to follow up with her neurology soon.

## 2011-03-26 NOTE — Assessment & Plan Note (Signed)
Unclear cause at this time, am concerned for rib fracture vs. Liver engorgement vs. Hemopneumothorax as cause.  No lung findings or abdominal/liver findings on exam today, therefor rib injury most likely.  Will order chest x-ray.

## 2011-03-27 LAB — AMMONIA: Ammonia: 117 umol/L — ABNORMAL HIGH (ref 16–53)

## 2011-03-29 ENCOUNTER — Telehealth: Payer: Self-pay | Admitting: Family Medicine

## 2011-03-29 ENCOUNTER — Ambulatory Visit (INDEPENDENT_AMBULATORY_CARE_PROVIDER_SITE_OTHER): Payer: Medicare Other | Admitting: Family Medicine

## 2011-03-29 VITALS — BP 121/83 | HR 69 | Ht 62.0 in | Wt 149.0 lb

## 2011-03-29 DIAGNOSIS — R0781 Pleurodynia: Secondary | ICD-10-CM

## 2011-03-29 DIAGNOSIS — K7682 Hepatic encephalopathy: Secondary | ICD-10-CM

## 2011-03-29 DIAGNOSIS — M79609 Pain in unspecified limb: Secondary | ICD-10-CM

## 2011-03-29 DIAGNOSIS — M79646 Pain in unspecified finger(s): Secondary | ICD-10-CM

## 2011-03-29 DIAGNOSIS — R079 Chest pain, unspecified: Secondary | ICD-10-CM

## 2011-03-29 DIAGNOSIS — K7290 Hepatic failure, unspecified without coma: Secondary | ICD-10-CM

## 2011-03-29 MED ORDER — OXYCODONE HCL 5 MG PO TABS: 5.0000 mg | ORAL_TABLET | Freq: Three times a day (TID) | ORAL | Status: AC | PRN

## 2011-03-29 NOTE — Progress Notes (Signed)
  Subjective:    Patient ID: [name], female    DOB: 12/12/1985, 25 y.o.   MRN: [id number]  HPI Jammed her left thumb, fell forward carrying a clothes basket, has long acrylic nails.  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 Last plantlet count was 147,000 3 days ago.  The thumb is swollen at the tip, bruised and very painful.   Patient reported when asked that she is not taking her lactulose, noted that her serum ammonia level was 117 on 8/17.   Review of Systems  Constitutional: Negative for fever.  Musculoskeletal:       Severe pain at the tip of the left thumb  Neurological: Negative for tremors and seizures.  Psychiatric/Behavioral: Negative for confusion.       Objective:   Physical Exam  Constitutional:       Chronically ill appearing for 25 year old.  Eyes: Scleral icterus is present.  Musculoskeletal:       Hematoma formation under the thumb nail extending to the DIP joint.  Good extensor and flexor strength.          Assessment & Plan:

## 2011-03-29 NOTE — Patient Instructions (Signed)
Go to [facility] center for your Xray I will call with results Keep the splint on with the pink wrap Return in one week with Dr. Mable Paris must begin to take your lactulose twice daily, your ammonia level is elevated

## 2011-03-29 NOTE — Telephone Encounter (Signed)
Called patient to notify her her Ammonia level was high- she had an appointment with Luretha Murphy this morning, who had already told her.  Patient is going to take lactulose.

## 2011-03-29 NOTE — Telephone Encounter (Signed)
Went to pharmacy and they told her that she is taking too much of the Oxycodone since she has liver disease.  Wants to know what else she can take for pain. [facility]- [address]

## 2011-03-29 NOTE — Assessment & Plan Note (Signed)
Ammonia level at 117, patient reports not taking lactulose.  Very alert today, no tremor, no signs of acute mental status changes.  Instructed to begin lactulose 30 cc bid.

## 2011-03-29 NOTE — Assessment & Plan Note (Signed)
Seen with Sports Medicine Fellow Dr. Ozella Almond, will contact him if broken.  In the meantime, splint applied.  Script given for Oxycodone 5 mg, #30, no refills.  She has used this in the past[facility]  Instructed that she must get in to meet her new doctor and come in regularly.

## 2011-03-30 NOTE — Telephone Encounter (Signed)
Also spoke with pharmacy. They held an oxycodone 5mg  #30 prescription from 8/20 after speaking to our clinic. [name] filled oxycodone 5mg  IR #30 tablet on 8/17, then was given another rx on 8/20 for continued pain. Told pharmacist she had a "high pain tolerance". I advised pharmacy to not fill the second prescription. She should come back to clinic for continued pain.

## 2011-03-30 NOTE — Telephone Encounter (Signed)
Returned call and left message. I advised she avoid too much oxycodone (clinic notes indicate high ammonia level and liver failure). Ok to take reduced dose tylenol up to 650mg  TID. WOuld avoid NSAIDs and aspirin with her history of thrombocytopenia and risk of bleeding.

## 2011-03-30 NOTE — Telephone Encounter (Signed)
Pt is requesting to speak with Dr Cristal Ford asap.  pls call today

## 2011-04-01 ENCOUNTER — Emergency Department (HOSPITAL_COMMUNITY)
Admission: EM | Admit: 2011-04-01 | Discharge: 2011-04-01 | Disposition: A | Discharge status: Home / Self Care | Payer: Medicare Other | Attending: Emergency Medicine | Admitting: Emergency Medicine

## 2011-04-01 DIAGNOSIS — Z8619 Personal history of other infectious and parasitic diseases: Secondary | ICD-10-CM | POA: Insufficient documentation

## 2011-04-01 DIAGNOSIS — G8929 Other chronic pain: Secondary | ICD-10-CM | POA: Insufficient documentation

## 2011-04-01 DIAGNOSIS — R10811 Right upper quadrant abdominal tenderness: Secondary | ICD-10-CM | POA: Insufficient documentation

## 2011-04-01 DIAGNOSIS — R109 Unspecified abdominal pain: Secondary | ICD-10-CM | POA: Insufficient documentation

## 2011-04-01 LAB — COMPREHENSIVE METABOLIC PANEL
Albumin: 2.4 g/dL — ABNORMAL LOW (ref 3.5–5.2)
BUN: 6 mg/dL (ref 6–23)
Creatinine, Ser: <0.47 mg/dL — ABNORMAL LOW (ref 0.50–1.10)
Glucose, Bld: 114 mg/dL — ABNORMAL HIGH (ref 70–99)
Total Protein: 7 g/dL (ref 6.0–8.3)

## 2011-04-01 LAB — URINALYSIS, ROUTINE W REFLEX MICROSCOPIC
Ketones, ur: NEGATIVE mg/dL
Nitrite: NEGATIVE
Protein, ur: 30 mg/dL — AB
Specific Gravity, Urine: 1.013 (ref 1.005–1.030)
Urobilinogen, UA: 0.2 mg/dL (ref 0.0–1.0)

## 2011-04-01 LAB — CBC
HCT: 39.7 % (ref 36.0–46.0)
MCH: 32.7 pg (ref 26.0–34.0)
MCHC: 35 g/dL (ref 30.0–36.0)
MCV: 93.4 fL (ref 78.0–100.0)
RDW: 16 % — ABNORMAL HIGH (ref 11.5–15.5)

## 2011-04-01 LAB — LIPASE, BLOOD: Lipase: 16 U/L (ref 11–59)

## 2011-04-01 LAB — POCT PREGNANCY, URINE: Preg Test, Ur: NEGATIVE

## 2011-04-03 ENCOUNTER — Emergency Department (HOSPITAL_COMMUNITY): Payer: Medicare Other

## 2011-04-03 ENCOUNTER — Emergency Department (HOSPITAL_COMMUNITY)
Admission: EM | Admit: 2011-04-03 | Discharge: 2011-04-03 | Disposition: A | Discharge status: Home / Self Care | Payer: Medicare Other | Attending: Emergency Medicine | Admitting: Emergency Medicine

## 2011-04-03 DIAGNOSIS — R109 Unspecified abdominal pain: Secondary | ICD-10-CM | POA: Insufficient documentation

## 2011-04-03 DIAGNOSIS — G40909 Epilepsy, unspecified, not intractable, without status epilepticus: Secondary | ICD-10-CM | POA: Insufficient documentation

## 2011-04-03 DIAGNOSIS — G8929 Other chronic pain: Secondary | ICD-10-CM | POA: Insufficient documentation

## 2011-04-03 DIAGNOSIS — R10811 Right upper quadrant abdominal tenderness: Secondary | ICD-10-CM | POA: Insufficient documentation

## 2011-04-03 DIAGNOSIS — R112 Nausea with vomiting, unspecified: Secondary | ICD-10-CM | POA: Insufficient documentation

## 2011-04-03 DIAGNOSIS — Z8619 Personal history of other infectious and parasitic diseases: Secondary | ICD-10-CM | POA: Insufficient documentation

## 2011-04-03 LAB — URINALYSIS, ROUTINE W REFLEX MICROSCOPIC
Hgb urine dipstick: NEGATIVE
Nitrite: NEGATIVE
Specific Gravity, Urine: 1.021 (ref 1.005–1.030)
Urobilinogen, UA: 1 mg/dL (ref 0.0–1.0)
pH: 8.5 — ABNORMAL HIGH (ref 5.0–8.0)

## 2011-04-03 LAB — URINE MICROSCOPIC-ADD ON

## 2011-04-03 LAB — AMMONIA: Ammonia: 51 umol/L (ref 11–60)

## 2011-04-03 LAB — COMPREHENSIVE METABOLIC PANEL
ALT: 100 U/L — ABNORMAL HIGH (ref 0–35)
AST: 259 U/L — ABNORMAL HIGH (ref 0–37)
Albumin: 2.4 g/dL — ABNORMAL LOW (ref 3.5–5.2)
CO2: 25 mEq/L (ref 19–32)
Chloride: 99 mEq/L (ref 96–112)
Creatinine, Ser: 0.52 mg/dL (ref 0.50–1.10)
Sodium: 136 mEq/L (ref 135–145)
Total Bilirubin: 9.1 mg/dL — ABNORMAL HIGH (ref 0.3–1.2)

## 2011-04-03 LAB — CBC
MCV: 93.8 fL (ref 78.0–100.0)
Platelets: 115 10*3/uL — ABNORMAL LOW (ref 150–400)
RBC: 4.21 MIL/uL (ref 3.87–5.11)
RDW: 16.3 % — ABNORMAL HIGH (ref 11.5–15.5)
WBC: 4.5 10*3/uL (ref 4.0–10.5)

## 2011-04-03 LAB — POCT PREGNANCY, URINE: Preg Test, Ur: NEGATIVE

## 2011-04-04 ENCOUNTER — Emergency Department (HOSPITAL_COMMUNITY)
Admission: EM | Admit: 2011-04-04 | Discharge: 2011-04-04 | Disposition: A | Discharge status: Home / Self Care | Payer: Medicare Other | Attending: Emergency Medicine | Admitting: Emergency Medicine

## 2011-04-04 ENCOUNTER — Emergency Department (HOSPITAL_COMMUNITY): Payer: Medicare Other

## 2011-04-04 DIAGNOSIS — R109 Unspecified abdominal pain: Secondary | ICD-10-CM | POA: Insufficient documentation

## 2011-04-04 DIAGNOSIS — R112 Nausea with vomiting, unspecified: Secondary | ICD-10-CM | POA: Insufficient documentation

## 2011-04-04 DIAGNOSIS — K7460 Unspecified cirrhosis of liver: Secondary | ICD-10-CM | POA: Insufficient documentation

## 2011-04-04 DIAGNOSIS — K754 Autoimmune hepatitis: Secondary | ICD-10-CM | POA: Insufficient documentation

## 2011-04-04 DIAGNOSIS — D693 Immune thrombocytopenic purpura: Secondary | ICD-10-CM | POA: Insufficient documentation

## 2011-04-04 DIAGNOSIS — G8929 Other chronic pain: Secondary | ICD-10-CM | POA: Insufficient documentation

## 2011-04-04 LAB — CBC
HCT: 35.8 % — ABNORMAL LOW (ref 36.0–46.0)
MCV: 93 fL (ref 78.0–100.0)
RBC: 3.85 MIL/uL — ABNORMAL LOW (ref 3.87–5.11)
WBC: 5.1 10*3/uL (ref 4.0–10.5)

## 2011-04-04 LAB — URINALYSIS, ROUTINE W REFLEX MICROSCOPIC
Glucose, UA: NEGATIVE mg/dL
Ketones, ur: NEGATIVE mg/dL
Leukocytes, UA: NEGATIVE
Protein, ur: 100 mg/dL — AB

## 2011-04-04 LAB — COMPREHENSIVE METABOLIC PANEL
AST: 195 U/L — ABNORMAL HIGH (ref 0–37)
Alkaline Phosphatase: 1041 U/L — ABNORMAL HIGH (ref 39–117)
BUN: 8 mg/dL (ref 6–23)
CO2: 28 mEq/L (ref 19–32)
Chloride: 102 mEq/L (ref 96–112)
Creatinine, Ser: <0.47 mg/dL — ABNORMAL LOW (ref 0.50–1.10)
Potassium: 3.4 mEq/L — ABNORMAL LOW (ref 3.5–5.1)
Total Bilirubin: 8.7 mg/dL — ABNORMAL HIGH (ref 0.3–1.2)

## 2011-04-04 LAB — URINE MICROSCOPIC-ADD ON

## 2011-04-04 LAB — POCT PREGNANCY, URINE: Preg Test, Ur: NEGATIVE

## 2011-04-04 LAB — DIFFERENTIAL
Basophils Absolute: 0 10*3/uL (ref 0.0–0.1)
Lymphocytes Relative: 9 % — ABNORMAL LOW (ref 12–46)
Lymphs Abs: 0.5 10*3/uL — ABNORMAL LOW (ref 0.7–4.0)
Neutro Abs: 4.2 10*3/uL (ref 1.7–7.7)
Neutrophils Relative %: 82 % — ABNORMAL HIGH (ref 43–77)

## 2011-04-04 LAB — LIPASE, BLOOD: Lipase: 17 U/L (ref 11–59)

## 2011-04-05 ENCOUNTER — Other Ambulatory Visit: Payer: Self-pay | Admitting: Oncology

## 2011-04-05 ENCOUNTER — Encounter (HOSPITAL_BASED_OUTPATIENT_CLINIC_OR_DEPARTMENT_OTHER): Payer: Medicare Other | Admitting: Oncology

## 2011-04-05 DIAGNOSIS — D6959 Other secondary thrombocytopenia: Secondary | ICD-10-CM

## 2011-04-05 DIAGNOSIS — K754 Autoimmune hepatitis: Secondary | ICD-10-CM

## 2011-04-05 DIAGNOSIS — D731 Hypersplenism: Secondary | ICD-10-CM

## 2011-04-05 DIAGNOSIS — K7460 Unspecified cirrhosis of liver: Secondary | ICD-10-CM

## 2011-04-05 LAB — CBC WITH DIFFERENTIAL/PLATELET
Basophils Absolute: 0 10*3/uL (ref 0.0–0.1)
Eosinophils Absolute: 0.1 10*3/uL (ref 0.0–0.5)
HCT: 34.7 % — ABNORMAL LOW (ref 34.8–46.6)
HGB: 11.9 g/dL (ref 11.6–15.9)
MCH: 32.2 pg (ref 25.1–34.0)
MONO#: 0.6 10*3/uL (ref 0.1–0.9)
NEUT#: 5 10*3/uL (ref 1.5–6.5)
NEUT%: 76.3 % (ref 38.4–76.8)
RDW: 17.3 % — ABNORMAL HIGH (ref 11.2–14.5)
lymph#: 0.8 10*3/uL — ABNORMAL LOW (ref 0.9–3.3)

## 2011-04-07 ENCOUNTER — Ambulatory Visit (INDEPENDENT_AMBULATORY_CARE_PROVIDER_SITE_OTHER): Payer: Medicare Other | Admitting: Family Medicine

## 2011-04-07 ENCOUNTER — Ambulatory Visit
Admission: RE | Admit: 2011-04-07 | Discharge: 2011-04-07 | Disposition: A | Discharge status: Home / Self Care | Payer: Medicare Other | Source: Ambulatory Visit | Attending: Family Medicine | Admitting: Family Medicine

## 2011-04-07 ENCOUNTER — Encounter: Payer: Self-pay | Admitting: Family Medicine

## 2011-04-07 ENCOUNTER — Telehealth: Payer: Self-pay | Admitting: Family Medicine

## 2011-04-07 VITALS — BP 114/70 | HR 80 | Ht 64.0 in | Wt 155.0 lb

## 2011-04-07 DIAGNOSIS — R0781 Pleurodynia: Secondary | ICD-10-CM

## 2011-04-07 DIAGNOSIS — K769 Liver disease, unspecified: Secondary | ICD-10-CM

## 2011-04-07 DIAGNOSIS — M79609 Pain in unspecified limb: Secondary | ICD-10-CM

## 2011-04-07 DIAGNOSIS — M79646 Pain in unspecified finger(s): Secondary | ICD-10-CM

## 2011-04-07 DIAGNOSIS — K7460 Unspecified cirrhosis of liver: Secondary | ICD-10-CM

## 2011-04-07 LAB — COMPREHENSIVE METABOLIC PANEL
Albumin: 2.5 g/dL — ABNORMAL LOW (ref 3.5–5.2)
Alkaline Phosphatase: 1101 U/L — ABNORMAL HIGH (ref 39–117)
BUN: 7 mg/dL (ref 6–23)
CO2: 30 mEq/L (ref 19–32)
Calcium: 8.3 mg/dL — ABNORMAL LOW (ref 8.4–10.5)
Chloride: 102 mEq/L (ref 96–112)
Glucose, Bld: 89 mg/dL (ref 70–99)
Potassium: 4.3 mEq/L (ref 3.5–5.3)
Sodium: 140 mEq/L (ref 135–145)
Total Protein: 5.3 g/dL — ABNORMAL LOW (ref 6.0–8.3)

## 2011-04-07 MED ORDER — OXYCODONE HCL 5 MG PO TABS: 5.0000 mg | ORAL_TABLET | ORAL | Status: AC | PRN

## 2011-04-07 NOTE — Telephone Encounter (Signed)
[facility] called resident on-call pager to notify about this patient's ammonia elevated ammonia at 127.   Patient feeling okay. Not confused. Several ED visits last week for nausea/vomiting. Not complaining of nausea/vomiting right now. Not vomited past few days. But has not eaten solid foods for the past few days. Anti-nausea medication suppositories help her nausea. Last used a few days ago. Has not needed recently.   Taking a gulp of lactulose three times a day. Has been doing this past month. Frequent bowel movement during day.   Patient has appointment with Dr. Cristal Ford tomorrow.  Since patient feeling well, just told her to continue current lactulose dose and that I would notify her PCP regarding her ammonia level.

## 2011-04-07 NOTE — Assessment & Plan Note (Signed)
Increase in lower extremity edema and 6 lb weight gain since last week. This maybe secondary to her recent n/v episode further diminishing her albumin/nutritional status. No signs of pulmonary edema. Will continue increased dose of diuretic (lasix 40mg  and aldactone 100mg  daily) and follow up on Friday for weight check. It may be beneficial to continue increased doses of lasix and aldactone if not improved. Repeat labs today to assess cr, potassium and ammonia level after lactulose therapy.
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
## 2011-04-07 NOTE — Patient Instructions (Signed)
We will make you an orthopedic referral to follow your thumb pain. We will call you with appointment.  You broke a bone in the end of your thumb. Keep thumb splinted. I will call you if labs are not normal. Increase your lasix to 2 tablets daily and aldactone 2 tablets daily. Schedule an appointment for follow up on Friday and we will check your weight.
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
## 2011-04-08 ENCOUNTER — Telehealth: Payer: Self-pay | Admitting: *Deleted

## 2011-04-08 NOTE — Telephone Encounter (Signed)
Ammonia level noted, unsure of the value of this test since no neurologic symptoms or abnormalities on recent exam. Patient to continue taking lactulose and will be following up tomorrow for weight check and LE edema.

## 2011-04-08 NOTE — Telephone Encounter (Signed)
Called pt and spoke with pt's mother. appt with dr.rendall 04-09-11 at 9 am. #[phone]. [address] Faxed info to [phone]. Also advised pt's mother to make sure, that pt keeps appt tomorrow and if not possible to r/s it. She agreed. Lorenda Hatchet, Renato Battles

## 2011-04-09 ENCOUNTER — Ambulatory Visit: Payer: Medicare Other | Admitting: Family Medicine

## 2011-04-15 ENCOUNTER — Ambulatory Visit: Payer: Medicare Other | Admitting: Family Medicine

## 2011-04-19 ENCOUNTER — Encounter: Payer: Self-pay | Admitting: Family Medicine

## 2011-04-19 ENCOUNTER — Ambulatory Visit (INDEPENDENT_AMBULATORY_CARE_PROVIDER_SITE_OTHER): Payer: Medicare Other | Admitting: Family Medicine

## 2011-04-19 VITALS — BP 116/73 | HR 66 | Temp 98.3°F | Ht 64.0 in | Wt 144.5 lb

## 2011-04-19 DIAGNOSIS — K769 Liver disease, unspecified: Secondary | ICD-10-CM

## 2011-04-19 DIAGNOSIS — Z23 Encounter for immunization: Secondary | ICD-10-CM

## 2011-04-19 DIAGNOSIS — K7460 Unspecified cirrhosis of liver: Secondary | ICD-10-CM

## 2011-04-19 DIAGNOSIS — Z111 Encounter for screening for respiratory tuberculosis: Secondary | ICD-10-CM

## 2011-04-19 MED ORDER — TETANUS-DIPHTH-ACELL PERTUSSIS 5-2.5-18.5 LF-MCG/0.5 IM SUSP: 0.5000 mL | Freq: Once | INTRAMUSCULAR | Status: DC

## 2011-04-19 MED ORDER — TUBERCULIN PPD 5 UNIT/0.1ML ID SOLN: 5.0000 [IU] | Freq: Once | INTRADERMAL | Status: DC

## 2011-04-19 NOTE — Progress Notes (Signed)
  Subjective:    Patient ID: [name], female    DOB: 04/28/1986, 25 y.o.   MRN: [id number]  HPI  1. Leg edema. Has resolved. Still taking double dose of lasix and aldactone since last office visit. Thinks she has lost the extra weight. Thinks she is drinking and urinating too much and mouth is dry.   2. N/V/D. Gastroenteritis symptoms resolved. Normal appetite now.  3. Thumb fracture. Was evaluated by orthopedist. Conservative management for now. WIll have repeat evaluation in one week. Was given splint but not using it today because it doesn't fit. Denies additional trauma since original injury.   4. Cirrhosis/hyperammonemia. Taking lactulose daily still. Loose stools BID. No confusion, lethargy, falls. Is working every day. 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 Denies abnormal bleeding, abdominal pain, fevers.   Review of Systems See HPI otherwise negative. PMH and smoking status reviewed.    Objective:   Physical Exam  Vitals reviewed. Constitutional: She is oriented to person, place, and time. She appears well-developed and well-nourished. No distress.  HENT:  Head: Normocephalic and atraumatic.       Dry mucus membranes. Tiny 1mm ulcer on left lateral border of tongue present for 2 days.   Eyes: EOM are normal. Pupils are equal, round, and reactive to light.  Cardiovascular: Normal rate, regular rhythm, normal heart sounds and intact distal pulses.  Exam reveals no gallop.   No murmur heard. Pulmonary/Chest: Effort normal and breath sounds normal. No respiratory distress. She has no wheezes. She has no rales.  Abdominal: Soft. Bowel sounds are normal. She exhibits no distension. There is no tenderness. There is no rebound.  Musculoskeletal: Normal range of motion. She exhibits no edema and no tenderness.       Distal thumb with mild ecchymosis. Mild bony deformity, no edema. No splint.   Neurological: She is alert and oriented to person, place, and time. No cranial  nerve deficit. She exhibits normal muscle tone. Coordination normal.  Skin: Skin is warm.  Psychiatric: She has a normal mood and affect.          Assessment & Plan:

## 2011-04-19 NOTE — Assessment & Plan Note (Signed)
To continue daily lactulose. Clinically stable on current auto-immune medications: imuran and promacta, followed by [facility] clinic.

## 2011-04-19 NOTE — Patient Instructions (Signed)
Nice to see you again. Decrease your lasix and aldactone to one tablet daily. Make appointment for check up in 2 months or sooner if your swelling gets bad. Try to wear thumb splint for protection.

## 2011-04-19 NOTE — Assessment & Plan Note (Signed)
Lower ext edema is now resolved and weight is down 11 lbs from previous. Will reduce diuretic regimen back to previous doses of lasix 20mg  daily and aldactone 50mg  daily to avoid dehydration. Continue daily lactulose, will not trend ammonia since no signs of encephalopathy today. To follow up in 2 months or prn if swelling returns. Will follow with [facility] as scheduled.

## 2011-04-19 NOTE — Assessment & Plan Note (Signed)
Distal tuft fracture being managed conservatively by ortho. Will be following up in next week. Patient noncompliant with splint.

## 2011-04-21 ENCOUNTER — Ambulatory Visit (INDEPENDENT_AMBULATORY_CARE_PROVIDER_SITE_OTHER): Payer: Medicare Other | Admitting: *Deleted

## 2011-04-21 DIAGNOSIS — IMO0001 Reserved for inherently not codable concepts without codable children: Secondary | ICD-10-CM

## 2011-04-21 DIAGNOSIS — Z111 Encounter for screening for respiratory tuberculosis: Secondary | ICD-10-CM

## 2011-04-21 NOTE — Progress Notes (Signed)
PPD negative-0 mm. 

## 2011-04-25 ENCOUNTER — Inpatient Hospital Stay (INDEPENDENT_AMBULATORY_CARE_PROVIDER_SITE_OTHER)
Admission: RE | Admit: 2011-04-25 | Discharge: 2011-04-25 | Disposition: A | Discharge status: Home / Self Care | Payer: Medicare Other | Source: Ambulatory Visit | Attending: Emergency Medicine | Admitting: Emergency Medicine

## 2011-04-25 DIAGNOSIS — S40019A Contusion of unspecified shoulder, initial encounter: Secondary | ICD-10-CM

## 2011-04-25 DIAGNOSIS — S0990XA Unspecified injury of head, initial encounter: Secondary | ICD-10-CM

## 2011-04-25 DIAGNOSIS — Z733 Stress, not elsewhere classified: Secondary | ICD-10-CM

## 2011-04-29 LAB — PREPARE PLATELET PHERESIS

## 2011-04-29 LAB — TYPE AND SCREEN
ABO/RH(D): O POS
Antibody Screen: NEGATIVE

## 2011-04-29 LAB — CBC
MCV: 101.1 — ABNORMAL HIGH
Platelets: 44 — CL
RDW: 15
WBC: 2.9 — ABNORMAL LOW

## 2011-04-29 LAB — PREPARE FRESH FROZEN PLASMA

## 2011-04-29 LAB — DIFFERENTIAL
Basophils Absolute: 0
Eosinophils Absolute: 0
Lymphocytes Relative: 24
Lymphs Abs: 0.7
Monocytes Relative: 6
Neutro Abs: 1.9

## 2011-04-29 LAB — COMPREHENSIVE METABOLIC PANEL
Alkaline Phosphatase: 546 — ABNORMAL HIGH
BUN: 8
CO2: 28
Chloride: 107
Creatinine, Ser: 0.7
GFR calc non Af Amer: >60
Potassium: 3.8
Total Bilirubin: 3.1 — ABNORMAL HIGH

## 2011-04-29 LAB — APTT
aPTT: 35
aPTT: 35
aPTT: 36
aPTT: 38 — ABNORMAL HIGH

## 2011-04-29 LAB — PROTIME-INR
INR: 1.4
INR: 1.4
Prothrombin Time: 15
Prothrombin Time: 15
Prothrombin Time: 17.2 — ABNORMAL HIGH

## 2011-04-29 LAB — PLATELET COUNT
Platelets: 18 — CL
Platelets: 29 — CL

## 2011-04-29 LAB — TECHNOLOGIST SMEAR REVIEW: Path Review: DECREASED

## 2011-04-30 LAB — SAMPLE TO BLOOD BANK

## 2011-04-30 LAB — PREPARE PLATELET PHERESIS

## 2011-05-07 LAB — BASIC METABOLIC PANEL
BUN: 5 — ABNORMAL LOW
CO2: 21
CO2: 23
Calcium: 7.9 — ABNORMAL LOW
Calcium: 8.1 — ABNORMAL LOW
Calcium: 8.2 — ABNORMAL LOW
Creatinine, Ser: 0.53
Creatinine, Ser: 0.54
Creatinine, Ser: 0.81
GFR calc Af Amer: >60
GFR calc Af Amer: >60
GFR calc non Af Amer: >60
GFR calc non Af Amer: >60
GFR calc non Af Amer: >60
Glucose, Bld: 142 — ABNORMAL HIGH
Glucose, Bld: 69 — ABNORMAL LOW
Potassium: 4.4
Sodium: 137

## 2011-05-07 LAB — DIFFERENTIAL
Basophils Absolute: 0
Basophils Absolute: 0
Basophils Relative: 0
Eosinophils Relative: 1
Lymphocytes Relative: 13
Lymphocytes Relative: 19
Lymphs Abs: 0.8
Neutro Abs: 2.1
Neutro Abs: 5
Neutrophils Relative %: 70
Neutrophils Relative %: 79 — ABNORMAL HIGH

## 2011-05-07 LAB — POCT CARDIAC MARKERS
CKMB, poc: <1 — ABNORMAL LOW
Myoglobin, poc: 176
Operator id: 222501
Troponin i, poc: <0.05

## 2011-05-07 LAB — PROTIME-INR
INR: 1.5
Prothrombin Time: 20.3 — ABNORMAL HIGH

## 2011-05-07 LAB — COMPREHENSIVE METABOLIC PANEL
ALT: 62 — ABNORMAL HIGH
AST: 122 — ABNORMAL HIGH
CO2: 25
Calcium: 8.2 — ABNORMAL LOW
GFR calc Af Amer: >60
Potassium: 3.5
Sodium: 137
Total Protein: 6.3

## 2011-05-07 LAB — POCT I-STAT, CHEM 8
BUN: 6
Calcium, Ion: 1.01 — ABNORMAL LOW
Hemoglobin: 16.3 — ABNORMAL HIGH
Sodium: 136
TCO2: 19

## 2011-05-07 LAB — DIC (DISSEMINATED INTRAVASCULAR COAGULATION)PANEL
INR: 1.6 — ABNORMAL HIGH
Platelets: 11 — CL
Smear Review: NONE SEEN
aPTT: 36

## 2011-05-07 LAB — POCT I-STAT 3, ART BLOOD GAS (G3+)
Acid-Base Excess: 2
Bicarbonate: 28.9 — ABNORMAL HIGH
Operator id: 244861
Patient temperature: 99

## 2011-05-07 LAB — RAPID URINE DRUG SCREEN, HOSP PERFORMED
Amphetamines: NOT DETECTED
Barbiturates: NOT DETECTED
Tetrahydrocannabinol: POSITIVE — AB

## 2011-05-07 LAB — PHOSPHORUS: Phosphorus: 2.7

## 2011-05-07 LAB — PREPARE PLATELET PHERESIS

## 2011-05-07 LAB — ETHANOL: Alcohol, Ethyl (B): <5

## 2011-05-07 LAB — ABO/RH: ABO/RH(D): O POS

## 2011-05-07 LAB — CBC
Hemoglobin: 12.3
MCHC: 34.9
MCHC: 35.5
Platelets: 39 — CL
RBC: 3.42 — ABNORMAL LOW
RDW: 14.7
RDW: 15.7 — ABNORMAL HIGH
WBC: 6.3

## 2011-05-07 LAB — PLATELET COUNT: Platelets: 16 — CL

## 2011-05-07 LAB — TSH: TSH: 3.997

## 2011-05-07 LAB — MAGNESIUM
Magnesium: 1.6
Magnesium: 1.8

## 2011-05-07 LAB — POCT PREGNANCY, URINE
Operator id: 222501
Preg Test, Ur: NEGATIVE

## 2011-05-07 LAB — SAVE SMEAR

## 2011-05-07 LAB — CORTISOL: Cortisol, Plasma: 22.2

## 2011-05-13 ENCOUNTER — Ambulatory Visit: Payer: Medicare Other | Admitting: Family Medicine

## 2011-05-17 ENCOUNTER — Ambulatory Visit: Payer: Medicare Other | Admitting: Family Medicine

## 2011-05-18 ENCOUNTER — Ambulatory Visit: Payer: Medicare Other | Admitting: Family Medicine

## 2011-05-24 ENCOUNTER — Ambulatory Visit: Payer: Medicare Other | Admitting: Family Medicine

## 2011-05-26 ENCOUNTER — Ambulatory Visit (INDEPENDENT_AMBULATORY_CARE_PROVIDER_SITE_OTHER): Payer: Medicare Other | Admitting: Family Medicine

## 2011-05-26 ENCOUNTER — Ambulatory Visit: Payer: Medicare Other | Admitting: Family Medicine

## 2011-05-26 ENCOUNTER — Encounter: Payer: Self-pay | Admitting: Family Medicine

## 2011-05-26 DIAGNOSIS — R600 Localized edema: Secondary | ICD-10-CM | POA: Insufficient documentation

## 2011-05-26 DIAGNOSIS — K759 Inflammatory liver disease, unspecified: Secondary | ICD-10-CM

## 2011-05-26 DIAGNOSIS — R609 Edema, unspecified: Secondary | ICD-10-CM

## 2011-05-26 MED ORDER — FUROSEMIDE 40 MG PO TABS: 40.0000 mg | ORAL_TABLET | Freq: Two times a day (BID) | ORAL | Status: DC

## 2011-05-26 NOTE — Patient Instructions (Signed)
Thank you for coming in today. I think your swelling is due to your liver.  Please start taking 1 40mg  lasix pill twice a day. Continue your two spirolactone pills a day.  We will get some labs today.  Come back on Friday.  Try to drink < 2L of liquids a day.  Try to eat less than 2g of salt a day.

## 2011-05-26 NOTE — Progress Notes (Signed)
Ms. Silvester presents to clinic today for lower extremity swelling. This is been off and on problem for her over the last year.  Previously she had lower sure the swelling he was managed with a limited time increase in her spironolactone and Lasix.  Starting 2 weeks ago she noted lower sure the swelling bilaterally.  In response she increased her spironolactone Lasix to 2 pills daily each.  This really has not improved her swelling. She denies any leg pain or cough. She does note that her abdomen may be slightly swollen.  She feels well otherwise.  PMH reviewed.  ROS as above otherwise neg Medications reviewed.   Exam:  BP 126/72  Pulse 73  Temp(Src) 98.1 F (36.7 C) (Oral)  Ht 5\' 4"  (1.626 m)  Wt 154 lb (69.854 kg)  BMI 26.43 kg/m2 Gen: Well NAD HEENT: EOMI,  MMM, mild scleral icterus. Lungs: CTABL Nl WOB Heart: RRR no MRG Abd: NABS, NT, ND Exts: Bilateral lower extremities with 2+ edema to knees. Pulses intact bilaterally.

## 2011-05-26 NOTE — Assessment & Plan Note (Signed)
Moderate edema, refractory to usual increase of diuretic dose. Plan to increase Lasix to 40 mg twice a day. Also will obtain copies of metabolic panel.. Patient to followup on Friday, with PCP or myself.

## 2011-05-27 LAB — COMPREHENSIVE METABOLIC PANEL
Albumin: 2.7 g/dL — ABNORMAL LOW (ref 3.5–5.2)
Alkaline Phosphatase: 739 U/L — ABNORMAL HIGH (ref 39–117)
BUN: 12 mg/dL (ref 6–23)
Creat: 0.56 mg/dL (ref 0.50–1.10)
Glucose, Bld: 86 mg/dL (ref 70–99)
Total Bilirubin: 7.1 mg/dL — ABNORMAL HIGH (ref 0.3–1.2)

## 2011-05-28 ENCOUNTER — Ambulatory Visit (INDEPENDENT_AMBULATORY_CARE_PROVIDER_SITE_OTHER): Payer: Medicare Other | Admitting: Family Medicine

## 2011-05-28 ENCOUNTER — Telehealth: Payer: Self-pay | Admitting: Family Medicine

## 2011-05-28 ENCOUNTER — Encounter: Payer: Self-pay | Admitting: Family Medicine

## 2011-05-28 VITALS — BP 116/59 | HR 60 | Temp 97.4°F | Ht 64.0 in | Wt 152.1 lb

## 2011-05-28 DIAGNOSIS — R600 Localized edema: Secondary | ICD-10-CM

## 2011-05-28 DIAGNOSIS — R609 Edema, unspecified: Secondary | ICD-10-CM

## 2011-05-28 MED ORDER — TORSEMIDE 20 MG PO TABS: 20.0000 mg | ORAL_TABLET | Freq: Two times a day (BID) | ORAL | Status: DC

## 2011-05-28 MED ORDER — MEDICAL COMPRESSION SOCKS MISC: 2.0000 | Freq: Every day | Status: DC

## 2011-05-28 NOTE — Telephone Encounter (Signed)
Called and advised to call insurance co. To find out, where to go. Pt agreed. Lorenda Hatchet, Renato Battles

## 2011-05-28 NOTE — Patient Instructions (Addendum)
Thank you for coming in today. Switch to torsemide twice a day.  Come back on Monday or Tuesday.  Get the compression stockings at [facility] or [employer].  Wear them in the morning.  Let me know if you feel bad.

## 2011-05-28 NOTE — Progress Notes (Signed)
[name] presents to clinic today to followup her lower extremity swelling. In the interim she has increased her Lasix to 40 mg twice a day. She thinks the swelling has not improved. She denies any dyspnea or increased abdominal swelling. She feels well otherwise. She has been compliant on all medications. These are reviewed in the medication section.  PMH reviewed.  ROS as above otherwise neg Medications reviewed.  Exam:  BP 116/59  Pulse 60  Temp(Src) 97.4 F (36.3 C) (Oral)  Ht 5\' 4"  (1.626 m)  Wt 152 lb 2 oz (69.003 kg)  BMI 26.11 kg/m2 Gen: Well NAD Lungs: Clear to auscultation bilaterally Abd: NABS, NT, ND Exts: 2-3+ edema bilateral lower extremities to the knees.

## 2011-05-28 NOTE — Telephone Encounter (Signed)
States that they don't take her insurance for the compression stocking at [facility] - wants to know where she should go.

## 2011-05-28 NOTE — Assessment & Plan Note (Signed)
I think this is do to her liver failure. I reviewed labs obtained on the 17th which are unchanged. Plan to switch to torsemide 20 mg twice a day, and continue increased dose of spironolactone. Will followup on Monday and obtain basic metabolic panel then. Also will use compression stockings to the thigh. Prescription written and instructions for use explained.

## 2011-06-01 ENCOUNTER — Ambulatory Visit: Payer: Medicare Other | Admitting: Family Medicine

## 2011-06-03 ENCOUNTER — Ambulatory Visit: Payer: Medicare Other | Admitting: Family Medicine

## 2011-06-14 ENCOUNTER — Encounter (HOSPITAL_COMMUNITY): Payer: Self-pay

## 2011-06-14 ENCOUNTER — Emergency Department (HOSPITAL_COMMUNITY)
Admission: EM | Admit: 2011-06-14 | Discharge: 2011-06-14 | Disposition: A | Discharge status: Home / Self Care | Payer: Medicare Other | Attending: Emergency Medicine | Admitting: Emergency Medicine

## 2011-06-14 DIAGNOSIS — N390 Urinary tract infection, site not specified: Secondary | ICD-10-CM

## 2011-06-14 DIAGNOSIS — R5381 Other malaise: Secondary | ICD-10-CM | POA: Insufficient documentation

## 2011-06-14 DIAGNOSIS — K7460 Unspecified cirrhosis of liver: Secondary | ICD-10-CM

## 2011-06-14 DIAGNOSIS — R109 Unspecified abdominal pain: Secondary | ICD-10-CM | POA: Insufficient documentation

## 2011-06-14 DIAGNOSIS — R5383 Other fatigue: Secondary | ICD-10-CM | POA: Insufficient documentation

## 2011-06-14 DIAGNOSIS — R112 Nausea with vomiting, unspecified: Secondary | ICD-10-CM | POA: Insufficient documentation

## 2011-06-14 LAB — HEPATIC FUNCTION PANEL
Albumin: 1.9 g/dL — ABNORMAL LOW (ref 3.5–5.2)
Alkaline Phosphatase: 637 U/L — ABNORMAL HIGH (ref 39–117)
Indirect Bilirubin: 2.9 mg/dL — ABNORMAL HIGH (ref 0.3–0.9)
Total Bilirubin: 6.1 mg/dL — ABNORMAL HIGH (ref 0.3–1.2)

## 2011-06-14 LAB — URINALYSIS, ROUTINE W REFLEX MICROSCOPIC
Glucose, UA: NEGATIVE mg/dL
Ketones, ur: 40 mg/dL — AB
Nitrite: NEGATIVE
Protein, ur: 100 mg/dL — AB
pH: 8.5 — ABNORMAL HIGH (ref 5.0–8.0)

## 2011-06-14 LAB — BASIC METABOLIC PANEL
Calcium: 8.7 mg/dL (ref 8.4–10.5)
Calcium: 8.1 mg/dL — ABNORMAL LOW (ref 8.4–10.5)
Creatinine, Ser: 0.6 mg/dL (ref 0.50–1.10)
GFR calc Af Amer: >90 mL/min (ref >90)
GFR calc non Af Amer: >90 mL/min (ref >90)
GFR calc non Af Amer: >90 mL/min (ref >90)
Potassium: 3.5 mEq/L (ref 3.5–5.1)
Sodium: 136 mEq/L (ref 135–145)
Sodium: 139 mEq/L (ref 135–145)

## 2011-06-14 LAB — DIFFERENTIAL
Basophils Absolute: 0 10*3/uL (ref 0.0–0.1)
Eosinophils Absolute: 0 10*3/uL (ref 0.0–0.7)
Eosinophils Relative: 0 % (ref 0–5)
Monocytes Absolute: 0.4 10*3/uL (ref 0.1–1.0)

## 2011-06-14 LAB — PROTIME-INR: INR: 1.81 — ABNORMAL HIGH (ref 0.00–1.49)

## 2011-06-14 LAB — CBC
HCT: 34.8 % — ABNORMAL LOW (ref 36.0–46.0)
MCH: 34.9 pg — ABNORMAL HIGH (ref 26.0–34.0)
MCHC: 36.2 g/dL — ABNORMAL HIGH (ref 30.0–36.0)
MCV: 96.4 fL (ref 78.0–100.0)
Platelets: 311 10*3/uL (ref 150–400)
RDW: 15.5 % (ref 11.5–15.5)

## 2011-06-14 LAB — LIPASE, BLOOD: Lipase: 27 U/L (ref 11–59)

## 2011-06-14 LAB — URINE MICROSCOPIC-ADD ON

## 2011-06-14 LAB — APTT: aPTT: 38 seconds — ABNORMAL HIGH (ref 24–37)

## 2011-06-14 MED ORDER — SODIUM CHLORIDE 0.9 % IV SOLN
999.0000 mL | Freq: Once | INTRAVENOUS | Status: AC
  Administered 2011-06-14: 999 mL via INTRAVENOUS

## 2011-06-14 MED ORDER — CIPROFLOXACIN HCL 500 MG PO TABS: 500.0000 mg | ORAL_TABLET | Freq: Two times a day (BID) | ORAL | Status: DC

## 2011-06-14 MED ORDER — PROMETHAZINE HCL 12.5 MG PO TABS: 12.5000 mg | ORAL_TABLET | Freq: Four times a day (QID) | ORAL | Status: DC | PRN

## 2011-06-14 MED ORDER — HYDROMORPHONE HCL PF 1 MG/ML IJ SOLN
1.0000 mg | Freq: Once | INTRAMUSCULAR | Status: AC
  Administered 2011-06-14: 1 mg via INTRAVENOUS
  Filled 2011-06-14: qty 1

## 2011-06-14 MED ORDER — ONDANSETRON HCL 4 MG/2ML IJ SOLN
4.0000 mg | Freq: Once | INTRAMUSCULAR | Status: AC
  Administered 2011-06-14: 4 mg via INTRAVENOUS
  Filled 2011-06-14: qty 2

## 2011-06-14 MED ORDER — CIPROFLOXACIN HCL 500 MG PO TABS: 500.0000 mg | ORAL_TABLET | Freq: Two times a day (BID) | ORAL | Status: AC

## 2011-06-14 MED ORDER — OXYCODONE HCL 5 MG PO TABS: 5.0000 mg | ORAL_TABLET | ORAL | Status: DC | PRN

## 2011-06-14 NOTE — ED Notes (Signed)
Patient is resting.  No s/sx of distress.  Lab called,  Blood was hemolysed.  Lab to recollect

## 2011-06-14 NOTE — ED Notes (Signed)
Patient is resting.  Complains of n/v and weakness.  She states she has 10/10 abd pain.  Patient has hx of liver disease since the age of 4.  Mother has left,  Contact number [phone]

## 2011-06-14 NOTE — ED Notes (Signed)
Patient states she is feeling much better. Phone provided

## 2011-06-14 NOTE — ED Provider Notes (Signed)
Medical screening examination/treatment/procedure(s) were performed by non-physician practitioner and as supervising physician I was immediately available for consultation/collaboration.  Lashona Schaaf K Zamirah Denny-Rasch, MD 06/14/11 1543 

## 2011-06-14 NOTE — ED Provider Notes (Addendum)
History     CSN: [id number] Arrival date & time: 06/14/2011  9:51 AM   First MD Initiated Contact with Patient 06/14/11 [phone]      Chief Complaint  Patient presents with  . Abdominal Pain    (Consider location/radiation/quality/duration/timing/severity/associated sxs/prior treatment) Patient is a 25 y.o. female presenting with abdominal pain. The history is provided by the patient.  Abdominal Pain The primary symptoms of the illness include abdominal pain, fatigue, nausea and vomiting. The primary symptoms of the illness do not include fever, shortness of breath, diarrhea, dysuria, vaginal discharge or vaginal bleeding. The current episode started more than 2 days ago. The onset of the illness was gradual. The problem has been gradually worsening.  Symptoms associated with the illness do not include chills.  Pt states she has chronic liver disease. Followed in [place]. States in last 3 days, nausea, vomiting, abdominal pain, unable to keep anything down. Denies recent drugs or alcohol.  Past Medical History  Diagnosis Date  . Substance abuse   . Autoimmune hepatitis   . Esophageal varices   . Hepatitis   . Cirrhosis     History reviewed. No pertinent past surgical history.  History reviewed. No pertinent family history.  History  Substance Use Topics  . Smoking status: Former Games developer  . Smokeless tobacco: Not on file  . Alcohol Use: No    OB History    Grav Para Term Preterm Abortions TAB SAB Ect Mult Living                  Review of Systems  Constitutional: Positive for fatigue. Negative for fever and chills.  Respiratory: Negative.  Negative for shortness of breath.   Cardiovascular: Negative.   Gastrointestinal: Positive for nausea, vomiting and abdominal pain. Negative for diarrhea.  Genitourinary: Negative.  Negative for dysuria, vaginal bleeding and vaginal discharge.  Musculoskeletal: Negative.   Skin:       Yellowing of the skin  Hematological:  Bruises/bleeds easily.    Allergies  Ibuprofen  Home Medications   Current Outpatient Rx  Name Route Sig Dispense Refill  . LEVETIRACETAM 750 MG PO TABS Oral Take 750 mg by mouth 2 (two) times daily.      . AZATHIOPRINE 50 MG PO TABS Oral Take 50 mg by mouth daily. Per [facility]    . MEDICAL COMPRESSION SOCKS MISC Does not apply 2 Devices by Does not apply route daily. Thigh length 1 each 1  . ELTROMBOPAG OLAMINE 50 MG PO TABS Oral Take 50 mg by mouth daily. Take on an empty stomach 1 hour before a meal or 2 hours after     . FUROSEMIDE 40 MG PO TABS Oral Take 40 mg by mouth 2 (two) times daily.      [name] HYDROCODONE-ACETAMINOPHEN 5-500 MG PO TABS Oral Take 1 tablet by mouth every 6 (six) hours as needed. For pain     . LACTULOSE 20 GM/30ML PO SOLN Oral Take 15 mLs by mouth daily.     [name] NADOLOL 20 MG PO TABS Oral Take 20 mg by mouth daily.      [name] POTASSIUM CHLORIDE 10 MEQ PO TBCR Oral Take 20 mEq by mouth daily.     [name] SPIRONOLACTONE 50 MG PO TABS Oral Take 50 mg by mouth daily.      . TORSEMIDE 20 MG PO TABS Oral Take 20 mg by mouth daily.        BP 129/69  Pulse 94  Temp(Src) 97.4 F (  36.3 C) (Oral)  Resp 20  SpO2 100%  Physical Exam  Constitutional: She is oriented to person, place, and time. She appears well-developed and well-nourished. She appears distressed.  HENT:  Head: Normocephalic.  Eyes: Pupils are equal, round, and reactive to light. Scleral icterus is present.  Neck: Neck supple.  Cardiovascular: Normal rate, regular rhythm and normal heart sounds.   Pulmonary/Chest: Effort normal and breath sounds normal. No respiratory distress.  Abdominal: Soft. Bowel sounds are normal. There is tenderness. There is no rebound and no guarding.  Neurological: She is alert and oriented to person, place, and time.  Skin: Skin is warm and dry.    ED Course  Procedures (including critical care time)  Pt with chronic hepatitis. Denies recent alcohol use. States being followed by [facility].  Will rehydrate, treat pain and nausea. Labs pending   Results for orders placed during the hospital encounter of 06/14/11  CBC      Component Value Range   WBC 7.5  4.0 - 10.5 (K/uL)   RBC 3.61 (*) 3.87 - 5.11 (MIL/uL)   Hemoglobin 12.6  12.0 - 15.0 (g/dL)   HCT 16.1 (*) 09.6 - 46.0 (%)   MCV 96.4  78.0 - 100.0 (fL)   MCH 34.9 (*) 26.0 - 34.0 (pg)   MCHC 36.2 (*) 30.0 - 36.0 (g/dL)   RDW 04.5  40.9 - 81.1 (%)   Platelets 311  150 - 400 (K/uL)  DIFFERENTIAL      Component Value Range   Neutrophils Relative 87 (*) 43 - 77 (%)   Neutro Abs 6.6  1.7 - 7.7 (K/uL)   Lymphocytes Relative 7 (*) 12 - 46 (%)   Lymphs Abs 0.5 (*) 0.7 - 4.0 (K/uL)   Monocytes Relative 6  3 - 12 (%)   Monocytes Absolute 0.4  0.1 - 1.0 (K/uL)   Eosinophils Relative 0  0 - 5 (%)   Eosinophils Absolute 0.0  0.0 - 0.7 (K/uL)   Basophils Relative 0  0 - 1 (%)   Basophils Absolute 0.0  0.0 - 0.1 (K/uL)  BASIC METABOLIC PANEL      Component Value Range   Sodium 136  135 - 145 (mEq/L)   Potassium 4.2  3.5 - 5.1 (mEq/L)   Chloride 100  96 - 112 (mEq/L)   CO2 24  19 - 32 (mEq/L)   Glucose, Bld 117 (*) 70 - 99 (mg/dL)   BUN 14  6 - 23 (mg/dL)   Creatinine, Ser 9.14  0.50 - 1.10 (mg/dL)   Calcium 8.7  8.4 - 78.2 (mg/dL)   GFR calc non Af Amer >90  >90 (mL/min)   GFR calc Af Amer >90  >90 (mL/min)  LIPASE, BLOOD      Component Value Range   Lipase 27  11 - 59 (U/L)  URINALYSIS, ROUTINE W REFLEX MICROSCOPIC      Component Value Range   Color, Urine AMBER (*) YELLOW    Appearance CLOUDY (*) CLEAR    Specific Gravity, Urine 1.025  1.005 - 1.030    pH 8.5 (*) 5.0 - 8.0    Glucose, UA NEGATIVE  NEGATIVE (mg/dL)   Hgb urine dipstick NEGATIVE  NEGATIVE    Bilirubin Urine SMALL (*) NEGATIVE    Ketones, ur 40 (*) NEGATIVE (mg/dL)   Protein, ur 956 (*) NEGATIVE (mg/dL)   Urobilinogen, UA 2.0 (*) 0.0 - 1.0 (mg/dL)   Nitrite NEGATIVE  NEGATIVE    Leukocytes, UA SMALL (*)  NEGATIVE   PREGNANCY, URINE       Component Value Range   Preg Test, Ur NEGATIVE    PROTIME-INR      Component Value Range   Prothrombin Time 21.3 (*) 11.6 - 15.2 (seconds)   INR 1.81 (*) 0.00 - 1.49   APTT      Component Value Range   aPTT 38 (*) 24 - 37 (seconds)  URINE MICROSCOPIC-ADD ON      Component Value Range   Squamous Epithelial / LPF MANY (*) RARE    WBC, UA 7-10  <3 (WBC/hpf)   RBC / HPF 0-2  <3 (RBC/hpf)   Bacteria, UA FEW (*) RARE    Urine-Other MUCOUS PRESENT    HEPATIC FUNCTION PANEL      Component Value Range   Total Protein 5.2 (*) 6.0 - 8.3 (g/dL)   Albumin 1.9 (*) 3.5 - 5.2 (g/dL)   AST 409 (*) 0 - 37 (U/L)   ALT 78 (*) 0 - 35 (U/L)   Alkaline Phosphatase 637 (*) 39 - 117 (U/L)   Total Bilirubin 6.1 (*) 0.3 - 1.2 (mg/dL)   Bilirubin, Direct 3.2 (*) 0.0 - 0.3 (mg/dL)   Indirect Bilirubin 2.9 (*) 0.3 - 0.9 (mg/dL)  BASIC METABOLIC PANEL      Component Value Range   Sodium 139  135 - 145 (mEq/L)   Potassium 3.5  3.5 - 5.1 (mEq/L)   Chloride 104  96 - 112 (mEq/L)   CO2 29  19 - 32 (mEq/L)   Glucose, Bld 108 (*) 70 - 99 (mg/dL)   BUN 13  6 - 23 (mg/dL)   Creatinine, Ser 8.11  0.50 - 1.10 (mg/dL)   Calcium 8.1 (*) 8.4 - 10.5 (mg/dL)   GFR calc non Af Amer >90  >90 (mL/min)   GFR calc Af Amer >90  >90 (mL/min)   Pt is feeling much better. Tolerating PO fluids in ED. Will d/c home with close follow up with PCP. VS normal.     MDM          Lottie Mussel, PA 06/14/11 1513  Lottie Mussel, PA 06/14/11 [phone]

## 2011-06-14 NOTE — ED Notes (Signed)
Patient is ready for discharge.  Additional pain medications ordered.  Crackers also provided

## 2011-06-14 NOTE — ED Notes (Signed)
Per ems- abd pan, n/v x2days, and this am pt stating "my heart hurts." Pt states she has vomited >10 times. Pain 10/10.

## 2011-06-15 NOTE — ED Provider Notes (Signed)
Medical screening examination/treatment/procedure(s) were performed by non-physician practitioner and as supervising physician I was immediately available for consultation/collaboration.  Jasmine Awe, MD 06/15/11 [phone]

## 2011-06-20 ENCOUNTER — Other Ambulatory Visit: Payer: Self-pay

## 2011-06-20 ENCOUNTER — Emergency Department (HOSPITAL_COMMUNITY)
Admission: EM | Admit: 2011-06-20 | Discharge: 2011-06-21 | Disposition: A | Discharge status: Home / Self Care | Payer: Medicare Other | Attending: Emergency Medicine | Admitting: Emergency Medicine

## 2011-06-20 ENCOUNTER — Encounter (HOSPITAL_COMMUNITY): Payer: Self-pay | Admitting: Emergency Medicine

## 2011-06-20 DIAGNOSIS — R002 Palpitations: Secondary | ICD-10-CM | POA: Insufficient documentation

## 2011-06-20 DIAGNOSIS — R17 Unspecified jaundice: Secondary | ICD-10-CM | POA: Insufficient documentation

## 2011-06-20 DIAGNOSIS — I4949 Other premature depolarization: Secondary | ICD-10-CM | POA: Insufficient documentation

## 2011-06-20 DIAGNOSIS — Z79899 Other long term (current) drug therapy: Secondary | ICD-10-CM | POA: Insufficient documentation

## 2011-06-20 DIAGNOSIS — R1013 Epigastric pain: Secondary | ICD-10-CM | POA: Insufficient documentation

## 2011-06-20 DIAGNOSIS — R1110 Vomiting, unspecified: Secondary | ICD-10-CM

## 2011-06-20 DIAGNOSIS — R112 Nausea with vomiting, unspecified: Secondary | ICD-10-CM | POA: Insufficient documentation

## 2011-06-20 DIAGNOSIS — K7460 Unspecified cirrhosis of liver: Secondary | ICD-10-CM | POA: Insufficient documentation

## 2011-06-20 DIAGNOSIS — Z8619 Personal history of other infectious and parasitic diseases: Secondary | ICD-10-CM | POA: Insufficient documentation

## 2011-06-20 DIAGNOSIS — E871 Hypo-osmolality and hyponatremia: Secondary | ICD-10-CM | POA: Insufficient documentation

## 2011-06-20 HISTORY — DX: Unspecified cirrhosis of liver: K74.60

## 2011-06-20 LAB — URINE MICROSCOPIC-ADD ON

## 2011-06-20 LAB — URINALYSIS, ROUTINE W REFLEX MICROSCOPIC
Bilirubin Urine: NEGATIVE
Hgb urine dipstick: NEGATIVE
Ketones, ur: NEGATIVE mg/dL
Protein, ur: 30 mg/dL — AB
Urobilinogen, UA: 1 mg/dL (ref 0.0–1.0)

## 2011-06-20 LAB — RAPID URINE DRUG SCREEN, HOSP PERFORMED
Barbiturates: NOT DETECTED
Tetrahydrocannabinol: POSITIVE — AB

## 2011-06-20 LAB — COMPREHENSIVE METABOLIC PANEL
ALT: 101 U/L — ABNORMAL HIGH (ref 0–35)
AST: 227 U/L — ABNORMAL HIGH (ref 0–37)
CO2: 26 mEq/L (ref 19–32)
Chloride: 90 mEq/L — ABNORMAL LOW (ref 96–112)
GFR calc non Af Amer: >90 mL/min (ref >90)
Sodium: 130 mEq/L — ABNORMAL LOW (ref 135–145)
Total Bilirubin: 7.7 mg/dL — ABNORMAL HIGH (ref 0.3–1.2)

## 2011-06-20 LAB — CBC
MCH: 34.7 pg — ABNORMAL HIGH (ref 26.0–34.0)
Platelets: 197 10*3/uL (ref 150–400)
RBC: 3.83 MIL/uL — ABNORMAL LOW (ref 3.87–5.11)
WBC: 7.3 10*3/uL (ref 4.0–10.5)

## 2011-06-20 LAB — PROTIME-INR
INR: 1.46 (ref 0.00–1.49)
Prothrombin Time: 18 seconds — ABNORMAL HIGH (ref 11.6–15.2)

## 2011-06-20 MED ORDER — GI COCKTAIL ~~LOC~~: 30.0000 mL | ORAL | Status: DC | PRN

## 2011-06-20 MED ORDER — SODIUM CHLORIDE 0.9 % IV SOLN
INTRAVENOUS | Status: DC
  Administered 2011-06-20: via INTRAVENOUS

## 2011-06-20 MED ORDER — ONDANSETRON HCL 4 MG/2ML IJ SOLN
INTRAMUSCULAR | Status: AC
  Administered 2011-06-20: 4 mg
  Filled 2011-06-20: qty 2

## 2011-06-20 MED ORDER — SODIUM CHLORIDE 0.9 % IV BOLUS (SEPSIS): 1000.0000 mL | Freq: Once | INTRAVENOUS | Status: DC

## 2011-06-20 MED ORDER — METOCLOPRAMIDE HCL 5 MG/ML IJ SOLN
10.0000 mg | Freq: Once | INTRAMUSCULAR | Status: AC
  Administered 2011-06-21: 10 mg via INTRAVENOUS
  Filled 2011-06-20: qty 2

## 2011-06-20 MED ORDER — SODIUM CHLORIDE 0.9 % IV SOLN
999.0000 mL | Freq: Once | INTRAVENOUS | Status: AC
  Administered 2011-06-20: 999 mL via INTRAVENOUS

## 2011-06-20 MED ORDER — ONDANSETRON HCL 4 MG/2ML IJ SOLN: 4.0000 mg | Freq: Four times a day (QID) | INTRAMUSCULAR | Status: DC | PRN

## 2011-06-20 MED ORDER — HYDROMORPHONE HCL PF 1 MG/ML IJ SOLN
1.0000 mg | Freq: Once | INTRAMUSCULAR | Status: AC
  Administered 2011-06-20: 1 mg via INTRAVENOUS
  Filled 2011-06-20: qty 1

## 2011-06-20 MED ORDER — ONDANSETRON HCL 4 MG/2ML IJ SOLN
4.0000 mg | Freq: Once | INTRAMUSCULAR | Status: AC
  Administered 2011-06-20: 4 mg via INTRAVENOUS
  Filled 2011-06-20: qty 2

## 2011-06-20 MED ORDER — MORPHINE SULFATE 2 MG/ML IJ SOLN
2.0000 mg | INTRAMUSCULAR | Status: DC | PRN
  Administered 2011-06-21 (×2): 2 mg via INTRAVENOUS
  Filled 2011-06-20 (×2): qty 1

## 2011-06-20 MED ORDER — SODIUM CHLORIDE 0.9 % IV SOLN: Freq: Once | INTRAVENOUS | Status: DC

## 2011-06-20 NOTE — ED Provider Notes (Signed)
History     CSN: [id number] Arrival date & time: 06/20/2011  9:20 PM   None     Chief Complaint  Patient presents with  . Abdominal Pain    (Consider location/radiation/quality/duration/timing/severity/associated sxs/prior treatment) Patient is a 25 y.o. female presenting with vomiting. The history is provided by the patient.  Emesis  This is a recurrent problem. The current episode started 12 to 24 hours ago. The problem occurs 5 to 10 times per day. The problem has been resolved. The emesis has an appearance of stomach contents and bright red blood. There has been no fever. Associated symptoms include abdominal pain. Pertinent negatives include no chills, no cough, no diarrhea, no fever, no headaches, no sweats and no URI. Risk factors: history of liver cirrhosis, similar episodes in the past.    Past Medical History  Diagnosis Date  . Substance abuse   . Autoimmune hepatitis   . Esophageal varices   . Hepatitis   . Cirrhosis   . Cirrhosis of liver     History reviewed. No pertinent past surgical history.  History reviewed. No pertinent family history.  History  Substance Use Topics  . Smoking status: Former Games developer  . Smokeless tobacco: Not on file  . Alcohol Use: Yes     drinks occassioanlly    OB History    Grav Para Term Preterm Abortions TAB SAB Ect Mult Living                  Review of Systems  Constitutional: Negative for fever and chills.  Respiratory: Negative for cough and shortness of breath.   Cardiovascular: Positive for palpitations. Negative for chest pain.  Gastrointestinal: Positive for nausea, vomiting and abdominal pain. Negative for diarrhea.  Neurological: Negative for headaches.  All other systems reviewed and are negative.    Allergies  Ibuprofen  Home Medications   Current Outpatient Rx  Name Route Sig Dispense Refill  . AZATHIOPRINE 50 MG PO TABS Oral Take 50 mg by mouth daily. Per [facility]    . CIPROFLOXACIN HCL 500 MG PO TABS  Oral Take 1 tablet (500 mg total) by mouth 2 (two) times daily. 10 tablet 0  . MEDICAL COMPRESSION SOCKS MISC Does not apply 2 Devices by Does not apply route daily. Thigh length 1 each 1  . ELTROMBOPAG OLAMINE 50 MG PO TABS Oral Take 50 mg by mouth daily. Take on an empty stomach 1 hour before a meal or 2 hours after     . FUROSEMIDE 40 MG PO TABS Oral Take 40 mg by mouth 2 (two) times daily.      [name] HYDROCODONE-ACETAMINOPHEN 5-500 MG PO TABS Oral Take 1 tablet by mouth every 6 (six) hours as needed. For pain     . LACTULOSE 20 GM/30ML PO SOLN Oral Take 15 mLs by mouth daily.     [name] LEVETIRACETAM 750 MG PO TABS Oral Take 750 mg by mouth 2 (two) times daily.      [name] NADOLOL 20 MG PO TABS Oral Take 20 mg by mouth daily.      . OXYCODONE HCL 5 MG PO TABS Oral Take 1 tablet (5 mg total) by mouth every 4 (four) hours as needed for pain. 20 tablet 0  . POTASSIUM CHLORIDE 10 MEQ PO TBCR Oral Take 20 mEq by mouth daily.     [name] PROMETHAZINE HCL 12.5 MG PO TABS Oral Take 1 tablet (12.5 mg total) by mouth every 6 (six) hours as needed for nausea.  30 tablet 0  . SPIRONOLACTONE 50 MG PO TABS Oral Take 50 mg by mouth daily.      . TORSEMIDE 20 MG PO TABS Oral Take 20 mg by mouth daily.        BP 119/71  Pulse 94  Temp(Src) 98.4 F (36.9 C) (Oral)  Resp 26  SpO2 100%  Physical Exam  Nursing note and vitals reviewed. Constitutional: She is oriented to person, place, and time. She appears well-developed and well-nourished. No distress.  HENT:  Head: Normocephalic and atraumatic.  Eyes: EOM are normal. Pupils are equal, round, and reactive to light. Scleral icterus is present.  Neck: Normal range of motion.  Cardiovascular: Normal rate, regular rhythm and normal heart sounds.   No murmur heard.      Frequent pvcs  Pulmonary/Chest: Effort normal and breath sounds normal. No respiratory distress.  Abdominal: Soft. Normal appearance. She exhibits no distension. There is tenderness.    Musculoskeletal:  Normal range of motion.  Neurological: She is alert and oriented to person, place, and time.  Skin: Skin is warm and dry.    ED Course  Procedures (including critical care time)  Labs Reviewed  PROTIME-INR - Abnormal; Notable for the following:    Prothrombin Time 18.0 (*)    All other components within normal limits  COMPREHENSIVE METABOLIC PANEL - Abnormal; Notable for the following:    Sodium 130 (*)    Chloride 90 (*)    Glucose, Bld 116 (*)    Albumin 2.5 (*)    AST 227 (*) HEMOLYSIS AT THIS LEVEL MAY AFFECT RESULT   ALT 101 (*) HEMOLYSIS AT THIS LEVEL MAY AFFECT RESULT   Alkaline Phosphatase 771 (*) HEMOLYSIS AT THIS LEVEL MAY AFFECT RESULT   Total Bilirubin 7.7 (*)    All other components within normal limits  URINALYSIS, ROUTINE W REFLEX MICROSCOPIC - Abnormal; Notable for the following:    Color, Urine AMBER (*) BIOCHEMICALS MAY BE AFFECTED BY COLOR   Appearance HAZY (*)    pH 8.5 (*)    Protein, ur 30 (*)    Leukocytes, UA SMALL (*)    All other components within normal limits  URINE RAPID DRUG SCREEN (HOSP PERFORMED) - Abnormal; Notable for the following:    Cocaine POSITIVE (*)    Tetrahydrocannabinol POSITIVE (*)    All other components within normal limits  CBC - Abnormal; Notable for the following:    RBC 3.83 (*)    MCH 34.7 (*)    RDW 15.7 (*)    All other components within normal limits  URINE MICROSCOPIC-ADD ON - Abnormal; Notable for the following:    Squamous Epithelial / LPF FEW (*)    Bacteria, UA FEW (*)    All other components within normal limits  ETHANOL     1. Vomiting       MDM  9:37 PM Pt seen and examined. Pt with history of liver cirrhosis seen 11/5 for vomiting and abdominal pain. Pt had been doing better, but yesterday and today has started to vomit again. Patient with scleral icterus on exam. Concern for elevated bilirubin. Will start to hydrate and check labs. Will treat nausea but as patient appears sleepy will not give pain  medicine at this time. Patient said she had similar episodes when she drank in the past, so will get uds and etoh levels.   10:52 PM Pt continuing to complain of pain and nausea. Will treat. Pt labs similar to recent. Will advise  patient that she needs to follow up with her liver doctor for her persistently elevated lab values.  11:19 PM Pt states that she is still feeling nauseated after two rounds of zofran. Will change to Reglan.  11:33 PM Will place in CDU protocol for dehydration. Pt care will be transferred to Dr. Hyacinth Meeker.  Daleen Bo 06/20/11 2357

## 2011-06-21 ENCOUNTER — Telehealth: Payer: Self-pay | Admitting: *Deleted

## 2011-06-21 ENCOUNTER — Ambulatory Visit (INDEPENDENT_AMBULATORY_CARE_PROVIDER_SITE_OTHER): Payer: Medicare Other | Admitting: Family Medicine

## 2011-06-21 ENCOUNTER — Encounter: Payer: Self-pay | Admitting: Family Medicine

## 2011-06-21 VITALS — BP 127/55 | HR 90 | Temp 98.3°F | Ht 63.0 in | Wt 147.0 lb

## 2011-06-21 DIAGNOSIS — R600 Localized edema: Secondary | ICD-10-CM

## 2011-06-21 DIAGNOSIS — E871 Hypo-osmolality and hyponatremia: Secondary | ICD-10-CM

## 2011-06-21 DIAGNOSIS — R112 Nausea with vomiting, unspecified: Secondary | ICD-10-CM

## 2011-06-21 DIAGNOSIS — R609 Edema, unspecified: Secondary | ICD-10-CM

## 2011-06-21 MED ORDER — PROMETHAZINE HCL 25 MG PO TABS: 25.0000 mg | ORAL_TABLET | Freq: Four times a day (QID) | ORAL | Status: DC | PRN

## 2011-06-21 NOTE — ED Notes (Signed)
MD at bedside. Dr Hyacinth Meeker in to reeval pt

## 2011-06-21 NOTE — ED Notes (Signed)
Patient has ambulated to the restroom to void. She is requesting more po fluids. Denies feeling nauseated. No active vomiting. Offered pt water but wanted/given sprite. Asked pt if she felt ready to go home and she states no because she still has a headache. Will address headache and reassessment with edp. Gait observed to be steady. Iv fluids via pump. Site asymptomatic. Sinus rhythm observed on monitor.

## 2011-06-21 NOTE — ED Notes (Signed)
Dr Hyacinth Meeker aware to come reeval pt.

## 2011-06-21 NOTE — Telephone Encounter (Signed)
RECEIVED A FAX FROM [facility] CONCERNING A PRESCRIPTION REFILL REQUEST FOR PROMACTA. THIS REQUEST WAS GIVEN TO DR.SHERRILL'S NURSE, SUSAN COWARD,RN.

## 2011-06-21 NOTE — Assessment & Plan Note (Signed)
Likely do to code ministration of Lasix and torsemide in addition to liver failure. Plan to stop Lasix and recheck labs in 3 days.

## 2011-06-21 NOTE — ED Provider Notes (Signed)
  Physical Exam  BP 114/53  Pulse 90  Temp(Src) 98.3 F (36.8 C) (Oral)  Resp 18  SpO2 100%  Physical Exam  ED Course  Procedures  MDM Pt reevaluated - tolerated PO overnight - novomiting while in CDU, VSnormal,ready for d/c.        Vida Roller, MD 06/21/11 [phone]

## 2011-06-21 NOTE — Patient Instructions (Signed)
Thank you for coming in today. Stop doing Cocaine and Mariajuana.  Please come back Thursday or Friday for a lab re-check.  Stop Taking LASIX. Continue Torsemide and the compression stockings.  Use promethazine as needed.  If you feel bad let us know.

## 2011-06-21 NOTE — Progress Notes (Signed)
[name] presents to clinic today to followup her recent emergency room visit. She was seen in the emergency room yesterday with vomiting and a sodium of 130. She denies any fevers or chills. Her last bowel movement was yesterday. She denies any blood in her stool or vomit. She notes that she used cocaine the same day she became nauseated. She was given Phenergan in the emergency room which helped with her nausea. She notes that she is taking both Lasix and torsemide instead of stopping Lasix.  PMH reviewed.  ROS as above otherwise neg Medications reviewed. Current Outpatient Prescriptions  Medication Sig Dispense Refill  . azaTHIOprine (IMURAN) 50 MG tablet Take 50 mg by mouth daily. Per [facility]      . Elastic Bandages & Supports (MEDICAL COMPRESSION SOCKS) MISC 2 Devices by Does not apply route daily. Thigh length  1 each  1  . eltrombopag (PROMACTA) 50 MG tablet Take 50 mg by mouth daily. Take on an empty stomach 1 hour before a meal or 2 hours after       . furosemide (LASIX) 40 MG tablet Take 40 mg by mouth 2 (two) times daily.        . Lactulose 20 GM/30ML SOLN Take 15 mLs by mouth daily.       [name] levETIRAcetam (KEPPRA) 750 MG tablet Take 750 mg by mouth 2 (two) times daily.        . promethazine (PHENERGAN) 12.5 MG tablet Take 1 tablet (12.5 mg total) by mouth every 6 (six) hours as needed for nausea.  30 tablet  0  . promethazine (PHENERGAN) 25 MG tablet Take 1 tablet (25 mg total) by mouth every 6 (six) hours as needed for nausea.  12 tablet  0  . spironolactone (ALDACTONE) 50 MG tablet Take 50 mg by mouth daily.        [name] torsemide (DEMADEX) 20 MG tablet Take 20 mg by mouth daily.        [name] HYDROcodone-acetaminophen (VICODIN) 5-500 MG per tablet Take 1 tablet by mouth every 6 (six) hours as needed. For pain       . nadolol (CORGARD) 20 MG tablet Take 20 mg by mouth daily.        [name] oxyCODONE (ROXICODONE) 5 MG immediate release tablet Take 1 tablet (5 mg total) by mouth every 4 (four) hours as  needed for pain.  20 tablet  0  . potassium chloride (KLOR-CON) 10 MEQ CR tablet Take 20 mEq by mouth daily.        Current Facility-Administered Medications  Medication Dose Route Frequency Provider Last Rate Last Dose  . TDaP (BOOSTRIX) injection 0.5 mL  0.5 mL Intramuscular Once Lloyd Huger, MD      . tuberculin injection 5 Units  5 Units Intradermal Once Lloyd Huger, MD       Facility-Administered Medications Ordered in Other Visits  Medication Dose Route Frequency Provider Last Rate Last Dose  . 0.9 %  sodium chloride infusion  999 mL Intravenous Once Daleen Bo   999 mL at 06/20/11 2145  . HYDROmorphone (DILAUDID) injection 1 mg  1 mg Intravenous Once Daleen Bo   1 mg at 06/20/11 2300  . metoCLOPramide (REGLAN) injection 10 mg  10 mg Intravenous Once Daleen Bo   10 mg at 06/21/11 0115  . ondansetron (ZOFRAN) 4 MG/2ML injection        4 mg at 06/20/11 2145  . ondansetron (ZOFRAN) injection 4 mg  4 mg  Intravenous Once Daleen Bo   4 mg at 06/20/11 2300  . DISCONTD: 0.9 %  sodium chloride infusion   Intravenous Once Daleen Bo      . DISCONTD: 0.9 %  sodium chloride infusion   Intravenous Continuous Daleen Bo      . DISCONTD: gi cocktail  30 mL Oral Q4H PRN Daleen Bo      . DISCONTD: morphine 2 MG/ML injection 2 mg  2 mg Intravenous Q2H PRN Daleen Bo   2 mg at 06/21/11 4098  . DISCONTD: ondansetron (ZOFRAN) injection 4 mg  4 mg Intravenous Q6H PRN Daleen Bo      . DISCONTD: sodium chloride 0.9 % bolus 1,000 mL  1,000 mL Intravenous Once Daleen Bo         Exam:  BP 127/55  Pulse 90  Temp(Src) 98.3 F (36.8 C) (Oral)  Ht 5\' 3"  (1.6 m)  Wt 147 lb (66.679 kg)  BMI 26.04 kg/m2 Gen: Well NAD HEENT: EOMI,  MMM, scleral icterus Lungs: CTABL Nl WOB Heart: RRR no MRG Abd: NABS,nontender nondistended no fluid wave Exts: Non edematous BL  LE, warm and well perfused.

## 2011-06-21 NOTE — ED Notes (Signed)
Pt awakened and asked to call for her ride home. Pt has been given applesauce to eat  Per md request

## 2011-06-21 NOTE — Assessment & Plan Note (Signed)
Not sure of etiology. Patient does not appear to be an extremist currently. I suspect that recent cocaine use may have a significant cause of her nausea. I advised cessation of cocaine and marijuana continued use of Phenergan as needed and return to clinic in 3 days or sooner as needed. Carefully reviewed red flag signs or symptoms that would promote return to health care. Patient expresses understanding.

## 2011-06-21 NOTE — ED Notes (Signed)
Pt does not appear in any distress. Checked for reglan which has not come from pharmacy

## 2011-06-21 NOTE — ED Provider Notes (Addendum)
Date: 06/21/2011  Rate: 109  Rhythm: premature ventricular contractions (PVC)  QRS Axis: normal  Intervals: normal  ST/T Wave abnormalities: normal  Conduction Disutrbances:none  Narrative Interpretation:   Old EKG Reviewed: unchanged    I saw and evaluated the patient, reviewed the resident's note and I agree with the findings and plan.  Patient with longstanding history of cirrhosis, occasion episodes of n/v/abd pain.  Started again tonight.  Labs at baseline, not feeling better with ivfs, anti emetics.  Will place in CDU for fluids,  Nausea control.  Geoffery Lyons, MD 06/21/11 0002  Geoffery Lyons, MD 06/21/11 [phone]

## 2011-06-21 NOTE — ED Notes (Signed)
Pt iv has been discontinued. Pt states her mother will be here around 0930 to pick her up. Pt encouraged to get dressed in preparation for her discharge home

## 2011-06-21 NOTE — Assessment & Plan Note (Signed)
Much improved with torsemide and spironolactone. And the addition of compression stockings. Plan to followup

## 2011-06-21 NOTE — ED Notes (Signed)
Pt with c/o continue nausea and abdominal pain. Pt pain 7/10. Pt resting and comfortable.

## 2011-06-22 ENCOUNTER — Telehealth: Payer: Self-pay | Admitting: *Deleted

## 2011-06-22 ENCOUNTER — Emergency Department (HOSPITAL_COMMUNITY)
Admission: EM | Admit: 2011-06-22 | Discharge: 2011-06-22 | Disposition: A | Discharge status: Home / Self Care | Payer: Medicare Other | Attending: Emergency Medicine | Admitting: Emergency Medicine

## 2011-06-22 ENCOUNTER — Encounter (HOSPITAL_COMMUNITY): Payer: Self-pay | Admitting: *Deleted

## 2011-06-22 DIAGNOSIS — R209 Unspecified disturbances of skin sensation: Secondary | ICD-10-CM | POA: Insufficient documentation

## 2011-06-22 DIAGNOSIS — K7460 Unspecified cirrhosis of liver: Secondary | ICD-10-CM | POA: Insufficient documentation

## 2011-06-22 DIAGNOSIS — D696 Thrombocytopenia, unspecified: Secondary | ICD-10-CM

## 2011-06-22 DIAGNOSIS — R112 Nausea with vomiting, unspecified: Secondary | ICD-10-CM | POA: Insufficient documentation

## 2011-06-22 DIAGNOSIS — K754 Autoimmune hepatitis: Secondary | ICD-10-CM | POA: Insufficient documentation

## 2011-06-22 DIAGNOSIS — R1013 Epigastric pain: Secondary | ICD-10-CM

## 2011-06-22 LAB — COMPREHENSIVE METABOLIC PANEL
Albumin: 2.2 g/dL — ABNORMAL LOW (ref 3.5–5.2)
BUN: 10 mg/dL (ref 6–23)
Creatinine, Ser: 0.55 mg/dL (ref 0.50–1.10)
Total Protein: 5.8 g/dL — ABNORMAL LOW (ref 6.0–8.3)

## 2011-06-22 LAB — URINE MICROSCOPIC-ADD ON

## 2011-06-22 LAB — CBC
HCT: 32.6 % — ABNORMAL LOW (ref 36.0–46.0)
Hemoglobin: 11.7 g/dL — ABNORMAL LOW (ref 12.0–15.0)
MCH: 35 pg — ABNORMAL HIGH (ref 26.0–34.0)
MCHC: 35.9 g/dL (ref 30.0–36.0)

## 2011-06-22 LAB — URINALYSIS, ROUTINE W REFLEX MICROSCOPIC
Ketones, ur: 15 mg/dL — AB
Nitrite: NEGATIVE
Specific Gravity, Urine: 1.022 (ref 1.005–1.030)
Urobilinogen, UA: 1 mg/dL (ref 0.0–1.0)
pH: 8.5 — ABNORMAL HIGH (ref 5.0–8.0)

## 2011-06-22 LAB — RAPID URINE DRUG SCREEN, HOSP PERFORMED
Benzodiazepines: NOT DETECTED
Cocaine: POSITIVE — AB

## 2011-06-22 LAB — DIFFERENTIAL
Basophils Relative: 0 % (ref 0–1)
Eosinophils Absolute: 0 10*3/uL (ref 0.0–0.7)
Eosinophils Relative: 0 % (ref 0–5)
Monocytes Absolute: 0.4 10*3/uL (ref 0.1–1.0)
Monocytes Relative: 5 % (ref 3–12)
Neutrophils Relative %: 91 % — ABNORMAL HIGH (ref 43–77)

## 2011-06-22 LAB — OCCULT BLOOD, POC DEVICE: Fecal Occult Bld: NEGATIVE

## 2011-06-22 LAB — APTT: aPTT: 41 seconds — ABNORMAL HIGH (ref 24–37)

## 2011-06-22 LAB — LIPASE, BLOOD: Lipase: 14 U/L (ref 11–59)

## 2011-06-22 LAB — PROTIME-INR: Prothrombin Time: 18.9 seconds — ABNORMAL HIGH (ref 11.6–15.2)

## 2011-06-22 LAB — POCT PREGNANCY, URINE: Preg Test, Ur: NEGATIVE

## 2011-06-22 MED ORDER — POTASSIUM CHLORIDE 20 MEQ/15ML (10%) PO LIQD
20.0000 meq | Freq: Once | ORAL | Status: AC
  Administered 2011-06-22: 20 meq via ORAL

## 2011-06-22 MED ORDER — OXYCODONE HCL 5 MG PO TABS: 5.0000 mg | ORAL_TABLET | ORAL | Status: AC | PRN

## 2011-06-22 MED ORDER — POTASSIUM CHLORIDE 20 MEQ/15ML (10%) PO LIQD
ORAL | Status: AC
  Administered 2011-06-22: 20 meq via ORAL
  Filled 2011-06-22: qty 30

## 2011-06-22 MED ORDER — ONDANSETRON HCL 4 MG/2ML IJ SOLN
INTRAMUSCULAR | Status: AC
  Filled 2011-06-22: qty 2

## 2011-06-22 MED ORDER — MORPHINE SULFATE 4 MG/ML IJ SOLN
INTRAMUSCULAR | Status: AC
  Filled 2011-06-22: qty 1

## 2011-06-22 MED ORDER — ELTROMBOPAG OLAMINE 50 MG PO TABS: 50.0000 mg | ORAL_TABLET | Freq: Every day | ORAL | Status: DC

## 2011-06-22 MED ORDER — MORPHINE SULFATE 2 MG/ML IJ SOLN: 2.0000 mg | Freq: Once | INTRAMUSCULAR | Status: DC

## 2011-06-22 MED ORDER — SODIUM CHLORIDE 0.9 % IV SOLN
999.0000 mL | Freq: Once | INTRAVENOUS | Status: AC
  Administered 2011-06-22: 999 mL via INTRAVENOUS

## 2011-06-22 MED ORDER — ONDANSETRON HCL 4 MG/2ML IJ SOLN
4.0000 mg | Freq: Once | INTRAMUSCULAR | Status: AC
  Administered 2011-06-22: 4 mg via INTRAVENOUS

## 2011-06-22 MED ORDER — MORPHINE SULFATE 2 MG/ML IJ SOLN
INTRAMUSCULAR | Status: AC
  Filled 2011-06-22: qty 1

## 2011-06-22 MED ORDER — MORPHINE SULFATE 2 MG/ML IJ SOLN
2.0000 mg | Freq: Once | INTRAMUSCULAR | Status: AC
  Administered 2011-06-22: 2 mg via INTRAVENOUS

## 2011-06-22 MED ORDER — ONDANSETRON 8 MG PO TBDP: 8.0000 mg | ORAL_TABLET | Freq: Three times a day (TID) | ORAL | Status: AC | PRN

## 2011-06-22 MED ORDER — MORPHINE SULFATE 4 MG/ML IJ SOLN
4.0000 mg | Freq: Once | INTRAMUSCULAR | Status: AC
  Administered 2011-06-22: 4 mg via INTRAVENOUS

## 2011-06-22 NOTE — ED Notes (Signed)
Patient asleep.

## 2011-06-22 NOTE — Telephone Encounter (Signed)
FAXED TO [facility]. NO REFILL NEEDED FROM [facility].

## 2011-06-22 NOTE — ED Notes (Signed)
Patient returns to ED with C/O nausea. She was given Ondansetron 4 mg IV by EMS which she states helped.

## 2011-06-22 NOTE — Progress Notes (Signed)
Family Medicine ED H+P  Pt. Is a 25 y/o female with chronic autoimmune hepatitis and cirrhosis cared for by Dr. Denyse Amass and Hepatology at [facility]. Patient also has seizure disorder for which she takes Keppra. Chief complaint is persistent vomiting and abdominal pain. She describes the abdominal pain as 4/10 and suprapubic. She c/o N/V since Saturday without diarrhea/fever/chest pain/sob/food poisoning/recent illness. She has also been using cocaine per nose and mariajuana. She stopped drinking alcohol two months ago. She has no other complaints. She was able to stay hydrated at home with fluids. She was using per rectal promethazine and oral promethazine and Zofran, but couldn't hold down those medications. On route from home to the ED via EMS she was given IV Zofran and her N/V resolved.  ROS: stool with specs of blood. Emesis with specks of blood. No melena, no hemoptysis. No other bleeding. No alerted mental status. No night sweats. No abdominal swelling or edema. Active Ambulatory Problems    Diagnosis Date Noted  . THROMBOCYTOPENIA 10/06/2006  . TOBACCO DEPENDENCE 10/06/2006  . INTRACRANIAL HEMORRHAGE 12/05/2007  . ESOPHAGEAL VARICES 10/06/2006  . HEPATIC CIRRHOSIS, NONALCOHOLIC 10/06/2006  . ENCEPHALOPATHY-HEPATIC 09/18/2009  . LIVER FAILURE 10/06/2006  . HEPATITIS NOS 10/06/2006  . AMENORRHEA 06/29/2007  . OTHER SPECIFIED DISEASE OF HAIR&HAIR FOLLICLES 12/05/2007  . PAPANICOLAOU SMEAR, ABNORMAL 10/06/2006  . PORTAL HYPERTENSION 09/18/2009  . Seizure disorder 03/26/2011  . Thumb pain 03/29/2011  . Edema leg 05/26/2011  . Nausea and vomiting in adult 06/21/2011  . Hyponatremia 06/21/2011   Resolved Ambulatory Problems    Diagnosis Date Noted  . SORE THROAT 09/17/2008  . VAGINAL DISCHARGE 06/29/2007  . Dysuria 06/29/2007  . ABDOMINAL PAIN-LUQ 09/18/2009  . PAPANICOLAOU SMEAR, ABNORMAL 10/06/2006  . Rib pain 03/26/2011   Past Medical History  Diagnosis Date  . Substance abuse   .  Autoimmune hepatitis   . Esophageal varices   . Hepatitis   . Cirrhosis   . Cirrhosis of liver    Physical Examination: General appearance - alert, well appearing, and in no distress, oriented to person, place, and time and well hydrated Mental status - alert, oriented to person, place, and time Eyes - icterus bilaterally Mouth - moist mucous membranes Neck - supple, no significant adenopathy Heart - normal rate and regular rhythm Abdomen - soft, non distended. Tender RUQ - but when distracted, no pain. Liver not enlarged via scratch test. Back exam - full range of motion, no tenderness, palpable spasm or pain on motion Extremities - peripheral pulses normal, no pedal edema, no clubbing or cyanosis Skin - dark skin, but jaundice coloration, no rash.   CBC:    Component Value Date/Time   WBC 8.4 06/22/2011 1823   HGB 11.7* 06/22/2011 1823   HGB 11.9 04/05/2011 1446   HCT 32.6* 06/22/2011 1823   HCT 34.7* 04/05/2011 1446   PLT 158 06/22/2011 1823   PLT 171 04/05/2011 1446   MCV 97.6 06/22/2011 1823   MCV 94.0 04/05/2011 1446   NEUTROABS 7.6 06/22/2011 1823   LYMPHSABS 0.4* 06/22/2011 1823   MONOABS 0.4 06/22/2011 1823   EOSABS 0.0 06/22/2011 1823   EOSABS 0.1 04/05/2011 1446   BASOSABS 0.0 06/22/2011 1823   BASOSABS 0.0 04/05/2011 1446   CMP     Component Value Date/Time   NA 131* 06/22/2011 1823   K 3.0* 06/22/2011 1823   CL 97 06/22/2011 1823   CO2 24 06/22/2011 1823   GLUCOSE 119* 06/22/2011 1823   BUN 10 06/22/2011 1823  CREATININE 0.55 06/22/2011 1823   CREATININE 0.56 05/26/2011 1408   CALCIUM 8.2* 06/22/2011 1823   PROT 5.8* 06/22/2011 1823   ALBUMIN 2.2* 06/22/2011 1823   AST 209* 06/22/2011 1823   ALT 93* 06/22/2011 1823   ALKPHOS 690* 06/22/2011 1823   BILITOT 6.4* 06/22/2011 1823   GFRNONAA >90 06/22/2011 1823   GFRAA >90 06/22/2011 1823    A/P 1. Abdominal pain Unsure of exact etiology, but benign physical exam. Likely related to chronic autoimmune  hepatitis. Obstruction, appendicitis, colitis, intraabdominal bleed, gastroenteritis, SBP unlikely because of vitals/labs/PE/history. Short script for PRN narcotics.  2. Nausea and Vomiting Most likely related to chronic hepatitis. Could be viral GI virus. She has experienced this before with her cirrhosis. Zofran ODT 8 mg.  F/u in clinic tomorrow morning for close follow up and to avoid hospital admission for chronic outpatient issue.  3. Hypokalemia Related to lasix use/emesis/poor oral intake.  - giving K-Lor in the ED 3.0 not critical value. Advised to drink K-rich fluids.  4. 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 Followed at [facility]. Labs this ER visit within the normal range for her chronic disease. No tylenol/NSAIDs No encephalopathy symptoms, no ascites. Childs class B MELDS score: 18

## 2011-06-22 NOTE — ED Notes (Signed)
Admit [facility]  Dillard Cannon [place], [place] 06/22/11 2040

## 2011-06-22 NOTE — ED Provider Notes (Signed)
History     CSN: [id number] Arrival date & time: 06/22/2011  6:01 PM   First MD Initiated Contact with Patient 06/22/11 1802      Chief Complaint  Patient presents with  . Nausea    (Consider location/radiation/quality/duration/timing/severity/associated sxs/prior treatment) HPI Comments: Patient with hx cirrhosis, autoimmune hepatitis, esophageal varices, who was seen in ED two days ago for same, reports epigastric pain with nausea and vomiting, not tolerating PO.  States she has vomited 12 times today.  Emesis is green, yellow, occasional red streaks, occasional red emesis. Also states that her entire face is numb. States bowel movements are normal.  Denies melena, urinary or vaginal symptoms.  Does not have normal periods.  Unknown LMP.  Patient's GI specialist is in [place] - appointment scheduled for the 17th of this month.    Patient is a 25 y.o. female presenting with abdominal pain. The history is provided by the patient. No language interpreter was used.  Abdominal Pain The primary symptoms of the illness include abdominal pain, nausea, vomiting and hematemesis.    Past Medical History  Diagnosis Date  . Substance abuse   . Autoimmune hepatitis   . Esophageal varices   . Hepatitis   . Cirrhosis   . Cirrhosis of liver     History reviewed. No pertinent past surgical history.  History reviewed. No pertinent family history.  History  Substance Use Topics  . Smoking status: Former Games developer  . Smokeless tobacco: Not on file  . Alcohol Use: Yes     drinks occassioanlly    OB History    Grav Para Term Preterm Abortions TAB SAB Ect Mult Living                  Review of Systems  Gastrointestinal: Positive for nausea, vomiting, abdominal pain and hematemesis.  All other systems reviewed and are negative.    Allergies  Ibuprofen  Home Medications   Current Outpatient Rx  Name Route Sig Dispense Refill  . AZATHIOPRINE 50 MG PO TABS Oral Take 50 mg by  mouth daily. Per [facility]    . MEDICAL COMPRESSION SOCKS MISC Does not apply 2 Devices by Does not apply route daily. Thigh length 1 each 1  . ELTROMBOPAG OLAMINE 50 MG PO TABS Oral Take 1 tablet (50 mg total) by mouth daily. Take on an empty stomach 1 hour before a meal or 2 hours after 30 tablet 2  . FUROSEMIDE 40 MG PO TABS Oral Take 40 mg by mouth 2 (two) times daily.      [name] HYDROCODONE-ACETAMINOPHEN 5-500 MG PO TABS Oral Take 1 tablet by mouth every 6 (six) hours as needed. For pain     . LACTULOSE 20 GM/30ML PO SOLN Oral Take 15 mLs by mouth daily.     [name] LEVETIRACETAM 750 MG PO TABS Oral Take 750 mg by mouth 2 (two) times daily.      [name] NADOLOL 20 MG PO TABS Oral Take 20 mg by mouth daily.      . OXYCODONE HCL 5 MG PO TABS Oral Take 1 tablet (5 mg total) by mouth every 4 (four) hours as needed for pain. 20 tablet 0  . POTASSIUM CHLORIDE 10 MEQ PO TBCR Oral Take 20 mEq by mouth daily.     [name] PROMETHAZINE HCL 12.5 MG PO TABS Oral Take 1 tablet (12.5 mg total) by mouth every 6 (six) hours as needed for nausea. 30 tablet 0  . PROMETHAZINE HCL 25 MG  PO TABS Oral Take 1 tablet (25 mg total) by mouth every 6 (six) hours as needed for nausea. 12 tablet 0  . SPIRONOLACTONE 50 MG PO TABS Oral Take 50 mg by mouth daily.      . TORSEMIDE 20 MG PO TABS Oral Take 20 mg by mouth daily.        BP 120/59  Pulse 86  Temp(Src) 98.4 F (36.9 C) (Oral)  Resp 12  SpO2 100%  Physical Exam  Constitutional: She is oriented to person, place, and time. She appears well-developed and well-nourished.  HENT:  Head: Normocephalic and atraumatic.  Neck: Neck supple.  Cardiovascular: Normal rate and normal heart sounds.  An irregular rhythm present.  Pulmonary/Chest: Breath sounds normal. No respiratory distress. She has no wheezes. She has no rales. She exhibits no tenderness.  Abdominal: Soft. Bowel sounds are normal. There is no tenderness.  Genitourinary: Rectal exam shows tenderness. Rectal exam shows no mass and  anal tone normal.       No frank blood on rectal exam, stool is yellow.   Neurological: She is alert and oriented to person, place, and time.    ED Course  Procedures (including critical care time) 6:17 PM Patient seen and examined, discussed with Dr Clarene Duke.   6:59 PM Rectal exam performed with Shawn, EMT, present as chaperone and per patient's request for assistance in removing pants and turning onto side.   Labs Reviewed - No data to display No results found.  9:42 PM Patient has been seen in consultation by family practice.  They feel that patient does not require admission at this time.  Request Rx for ODT zofran and #10 pills of oxycodone 5mg .  They will see her in the clinic tomorrow morning.    Hemoccult stool card is negative.  1. Nausea and vomiting in adult   2. Epigastric pain       MDM  Patient with hx cirrhosis, autoimmune hepatitis, with recurrent epigastric pain, N/V, second ED visit in 2 days with recurrent symptoms, not tolerating PO.  Seen by [facility] in ED who request d/c home with presciptions for ODT zofran and #10 oxycodone 5mg  with appointment with them early tomorrow morning.         Dillard Cannon [place], [place] 06/23/11 [phone]

## 2011-06-23 ENCOUNTER — Ambulatory Visit: Payer: Medicare Other | Admitting: Family Medicine

## 2011-06-23 ENCOUNTER — Telehealth: Payer: Self-pay | Admitting: Family Medicine

## 2011-06-23 NOTE — ED Provider Notes (Signed)
Medical screening examination/treatment/procedure(s) were performed by non-physician practitioner and as supervising physician I was immediately available for consultation/collaboration.   Merlin Golden M Zareena Willis, DO 06/23/11 0044 

## 2011-06-23 NOTE — Telephone Encounter (Signed)
Patient seen in the ED at 10:00 pm. Discharged home.  Patient needs to be seen as a work in today (your today) for close follow up concerning N/V and abdominal pain.  Thank you

## 2011-06-24 ENCOUNTER — Other Ambulatory Visit: Payer: Medicare Other

## 2011-06-24 ENCOUNTER — Inpatient Hospital Stay: Payer: Medicare Other

## 2011-06-25 ENCOUNTER — Ambulatory Visit: Payer: Medicare Other | Admitting: Family Medicine

## 2011-06-28 ENCOUNTER — Ambulatory Visit (INDEPENDENT_AMBULATORY_CARE_PROVIDER_SITE_OTHER): Payer: Medicare Other | Admitting: Family Medicine

## 2011-06-28 ENCOUNTER — Inpatient Hospital Stay: Payer: Medicare Other | Admitting: Family Medicine

## 2011-06-28 ENCOUNTER — Encounter: Payer: Self-pay | Admitting: Family Medicine

## 2011-06-28 VITALS — BP 118/80 | HR 84 | Temp 97.9°F | Ht 63.0 in | Wt 148.8 lb

## 2011-06-28 DIAGNOSIS — K7460 Unspecified cirrhosis of liver: Secondary | ICD-10-CM

## 2011-06-28 DIAGNOSIS — R600 Localized edema: Secondary | ICD-10-CM

## 2011-06-28 DIAGNOSIS — R609 Edema, unspecified: Secondary | ICD-10-CM

## 2011-06-28 DIAGNOSIS — R892 Abnormal level of other drugs, medicaments and biological substances in specimens from other organs, systems and tissues: Secondary | ICD-10-CM

## 2011-06-28 DIAGNOSIS — R112 Nausea with vomiting, unspecified: Secondary | ICD-10-CM

## 2011-06-28 DIAGNOSIS — E871 Hypo-osmolality and hyponatremia: Secondary | ICD-10-CM

## 2011-06-28 LAB — COMPREHENSIVE METABOLIC PANEL
Alkaline Phosphatase: 688 U/L — ABNORMAL HIGH (ref 39–117)
BUN: 11 mg/dL (ref 6–23)
Glucose, Bld: 102 mg/dL — ABNORMAL HIGH (ref 70–99)
Sodium: 137 mEq/L (ref 135–145)
Total Bilirubin: 5.7 mg/dL — ABNORMAL HIGH (ref 0.3–1.2)

## 2011-06-28 LAB — CBC
HCT: 32.9 % — ABNORMAL LOW (ref 36.0–46.0)
Hemoglobin: 11.5 g/dL — ABNORMAL LOW (ref 12.0–15.0)
MCH: 35.1 pg — ABNORMAL HIGH (ref 26.0–34.0)
MCHC: 35 g/dL (ref 30.0–36.0)
RDW: 16.8 % — ABNORMAL HIGH (ref 11.5–15.5)

## 2011-06-28 NOTE — Patient Instructions (Signed)
I would like for you to stop taking your Lasix and keep taking your Torsemide.  Weigh yourself on a daily basis and record your weights so we can get an idea of what does of fluid pills you are needing. I am getting some blood work to make sure the fluid pills you are taking in addition to the IV fluids you received aren't causing any electrolyte abnormalities. Make sure to keep your appointment with [facility] next month. I would encourage you to stop using cocaine as we all would agree that this is likely what caused your nausea/vomiting recently. Come back to see [place] in 2-3 weeks so we can get a handle on what your leg swelling is going to do.

## 2011-06-29 LAB — DRUG SCREEN, URINE
Barbiturate Quant, Ur: NEGATIVE
Marijuana Metabolite: POSITIVE — AB
Opiates: POSITIVE — AB
Phencyclidine (PCP): NEGATIVE
Propoxyphene: NEGATIVE

## 2011-07-19 ENCOUNTER — Ambulatory Visit: Payer: Medicare Other | Admitting: Family Medicine

## 2011-07-19 NOTE — Progress Notes (Signed)
Subjective: Pt presents today after an episode of nausea/vomiting that required a trip to the ED.  Pt is a patient with known cirrhosis on multiple medications who took opiates and cocaine just prior to the episode.  She was rehydrated in the ED and told to follow up with [place] today.  Pt reports that her symptoms have resolved and that she is feeling fine.  She reports that she was just testing things out and, with her recent experience, she will not be using drugs other than THC again.  Pt is also confused about what her medications are supposed to be as she has been taking multiple diuretics and is currently taking some doses of them all at the same time.  Objective:  Filed Vitals:   06/28/11 1547  BP: 118/80  Pulse: 84  Temp: 97.9 F (36.6 C)   Gen: NAD CV: RRR Resp: CTABL Abd: Mildly distended, soft, non-tender Ext: 1+ LE edema  Assessment/Plan: Adjust meds to just aldactone and torsemide.  Encourage no illicit substances.  Please also see individual problems in problem list for problem-specific plans.

## 2011-07-19 NOTE — Assessment & Plan Note (Signed)
Per review of chart, patient was having some problems with appropriate response to Lasix and was switched to Torsemide.  She became confused and is currently taking both of them.  Switch to just Torsemeide and f/u 2-3 weeks for checking appropriate dose based on weight/edema.

## 2011-07-22 ENCOUNTER — Ambulatory Visit: Payer: Medicare Other | Admitting: Family Medicine

## 2011-07-24 ENCOUNTER — Telehealth: Payer: Self-pay | Admitting: Oncology

## 2011-07-24 NOTE — Telephone Encounter (Signed)
Talked to pt, gave her appt date for 08/24/11, md and lab

## 2011-07-27 ENCOUNTER — Encounter: Payer: Self-pay | Admitting: Family Medicine

## 2011-07-27 ENCOUNTER — Ambulatory Visit (INDEPENDENT_AMBULATORY_CARE_PROVIDER_SITE_OTHER): Payer: Medicare Other | Admitting: Family Medicine

## 2011-07-27 VITALS — BP 114/67 | HR 74 | Ht 60.0 in | Wt 150.0 lb

## 2011-07-27 DIAGNOSIS — R609 Edema, unspecified: Secondary | ICD-10-CM

## 2011-07-27 NOTE — Progress Notes (Signed)
  Subjective:    Patient ID: [name], female    DOB: June 13, 1986, 25 y.o.   MRN: [id number]  HPI  Patient told nursing staff that she was here for followup of lower extremity edema. She states that her hematologist at [facility] discontinued the torsemide and was now placing her on Lasix 20 mg one tablet daily. She states that her edema was better.  Patient was not in the room when I went to see her and further search revealed she had or the left the building.  Review of Systems     Objective:   Physical Exam        Assessment & Plan:

## 2011-08-02 NOTE — Progress Notes (Signed)
Observation review for 06/20/11 is complete.

## 2011-08-23 ENCOUNTER — Other Ambulatory Visit: Payer: Self-pay | Admitting: *Deleted

## 2011-08-23 DIAGNOSIS — D696 Thrombocytopenia, unspecified: Secondary | ICD-10-CM

## 2011-08-24 ENCOUNTER — Other Ambulatory Visit: Payer: Self-pay | Admitting: *Deleted

## 2011-08-24 ENCOUNTER — Other Ambulatory Visit: Payer: Medicare Other

## 2011-08-24 ENCOUNTER — Ambulatory Visit: Payer: Medicare Other | Admitting: Oncology

## 2011-08-24 DIAGNOSIS — D696 Thrombocytopenia, unspecified: Secondary | ICD-10-CM

## 2011-08-24 NOTE — Progress Notes (Signed)
Patient was FTKA again today for her lab and MD visit. Order to scheduler to have her come in for lab this month and be seen with lab in Feb.

## 2011-08-25 ENCOUNTER — Telehealth: Payer: Self-pay | Admitting: Oncology

## 2011-08-25 NOTE — Telephone Encounter (Signed)
called pt lmovm with appt for 2-19 and to rtn call to me to confirm appt

## 2011-09-02 ENCOUNTER — Other Ambulatory Visit: Payer: Self-pay | Admitting: *Deleted

## 2011-09-02 DIAGNOSIS — K7460 Unspecified cirrhosis of liver: Secondary | ICD-10-CM

## 2011-09-02 MED ORDER — OMEPRAZOLE 20 MG PO CPDR: 20.0000 mg | DELAYED_RELEASE_CAPSULE | Freq: Every day | ORAL | Status: DC

## 2011-09-06 ENCOUNTER — Encounter: Payer: Medicare Other | Admitting: Family Medicine

## 2011-09-09 ENCOUNTER — Other Ambulatory Visit (HOSPITAL_COMMUNITY): Payer: Self-pay | Admitting: Gastroenterology

## 2011-09-22 ENCOUNTER — Other Ambulatory Visit: Payer: Self-pay | Admitting: *Deleted

## 2011-09-22 DIAGNOSIS — D696 Thrombocytopenia, unspecified: Secondary | ICD-10-CM

## 2011-09-22 MED ORDER — ELTROMBOPAG OLAMINE 50 MG PO TABS: 50.0000 mg | ORAL_TABLET | Freq: Every day | ORAL | Status: DC

## 2011-09-28 ENCOUNTER — Other Ambulatory Visit: Payer: Medicare Other | Admitting: Lab

## 2011-09-28 ENCOUNTER — Ambulatory Visit: Payer: Medicare Other | Admitting: Oncology

## 2011-10-04 ENCOUNTER — Telehealth: Payer: Self-pay | Admitting: Family Medicine

## 2011-10-04 NOTE — Telephone Encounter (Signed)
Patient reports some rectal bleeding for past month. Had stopped a week ago but  today has had some bleeding with two stools. . She notices it when she wipes. Not in toilet. She is having some discomfort rectally also. Offered appointment tomorrow but she has appointment already scheduled for Thursday for CPE. Wanted to know if she could get CPE if she comes in tomorrow . Advised will only be able to address the issue with rectal bleeding.  She wants to wait until Thursday. Advised if bleeding becomes heavy or large amount to call back or go to ED.

## 2011-10-04 NOTE — Telephone Encounter (Signed)
Patient would like to speak the nurse about some bleeding that she has going on.  She is scheduled to come in this Thursday.

## 2011-10-04 NOTE — Telephone Encounter (Signed)
Message left on voicemail to return call.

## 2011-10-07 ENCOUNTER — Other Ambulatory Visit: Payer: Self-pay

## 2011-10-07 ENCOUNTER — Encounter: Payer: Self-pay | Admitting: Family Medicine

## 2011-10-07 ENCOUNTER — Other Ambulatory Visit: Payer: Self-pay | Admitting: Family Medicine

## 2011-10-07 ENCOUNTER — Other Ambulatory Visit (HOSPITAL_COMMUNITY)
Admission: RE | Admit: 2011-10-07 | Discharge: 2011-10-07 | Disposition: A | Discharge status: Home / Self Care | Payer: Medicare Other | Source: Ambulatory Visit | Attending: Family Medicine | Admitting: Family Medicine

## 2011-10-07 ENCOUNTER — Ambulatory Visit (INDEPENDENT_AMBULATORY_CARE_PROVIDER_SITE_OTHER): Payer: Medicare Other | Admitting: Family Medicine

## 2011-10-07 DIAGNOSIS — Z124 Encounter for screening for malignant neoplasm of cervix: Secondary | ICD-10-CM

## 2011-10-07 DIAGNOSIS — K7460 Unspecified cirrhosis of liver: Secondary | ICD-10-CM

## 2011-10-07 DIAGNOSIS — Z113 Encounter for screening for infections with a predominantly sexual mode of transmission: Secondary | ICD-10-CM | POA: Insufficient documentation

## 2011-10-07 DIAGNOSIS — B9689 Other specified bacterial agents as the cause of diseases classified elsewhere: Secondary | ICD-10-CM

## 2011-10-07 DIAGNOSIS — N760 Acute vaginitis: Secondary | ICD-10-CM

## 2011-10-07 DIAGNOSIS — Z01419 Encounter for gynecological examination (general) (routine) without abnormal findings: Secondary | ICD-10-CM

## 2011-10-07 LAB — POCT WET PREP (WET MOUNT)
Clue Cells Wet Prep Whiff POC: POSITIVE
WBC, Wet Prep HPF POC: >20

## 2011-10-07 MED ORDER — OMEPRAZOLE 20 MG PO CPDR: 20.0000 mg | DELAYED_RELEASE_CAPSULE | Freq: Every day | ORAL | Status: DC

## 2011-10-07 MED ORDER — METRONIDAZOLE 0.75 % VA GEL: 1.0000 | Freq: Every day | VAGINAL | Status: AC

## 2011-10-07 NOTE — Progress Notes (Signed)
  Subjective:     [name] is a 26 y.o. female and is here for a comprehensive physical exam. The patient reports problems - notices some spotting on tissue from rectum after BM. Had colonoscopy and EGD last year at [facility] per pt report was normal. No gross blood in stool. Denies pain or external hemorrhoid or straining.  Due for pap and screening GC/Ch. Notices vaginal odor, wonders if medication is to blame. Hx of BV. Has sexual partner and uses condoms. Refuses contraception today.  No family history of cancer.  History   Social History  . Marital Status: Single    Spouse Name: N/A    Number of Children: N/A  . Years of Education: N/A   Occupational History  . Not on file.   Social History Main Topics  . Smoking status: Former Games developer  . Smokeless tobacco: Not on file  . Alcohol Use: Yes     drinks occassioanlly  . Drug Use: No     Pt denies but history of Cocaine and Marijuana use.   [name] Sexually Active: Not on file   Other Topics Concern  . Not on file   Social History Narrative  . No narrative on file   Health Maintenance  Topic Date Due  . Influenza Vaccine  05/09/2012  . Pap Smear  08/18/2012  . Tetanus/tdap  04/18/2021    The following portions of the patient's history were reviewed and updated as appropriate: allergies, current medications, past family history, past medical history, past social history, past surgical history and problem list.  Review of Systems Pertinent items are noted in HPI.   Objective:    BP 123/74  Pulse 71  Temp(Src) 97.7 F (36.5 C) (Oral)  Ht 5' (1.524 m)  Wt 152 lb 8 oz (69.174 kg)  BMI 29.78 kg/m2 General appearance: alert, cooperative, appears stated age and no distress Head: Normocephalic, without obvious abnormality, atraumatic Eyes: conjunctivae/corneas clear. PERRL, EOM's intact.  Throat: lips, mucosa, and tongue normal; teeth and gums normal Lungs: clear to auscultation bilaterally Heart: regular rate and rhythm, S1,  S2 normal, no murmur, click, rub or gallop Abdomen: soft, non-tender; bowel sounds normal; no masses,  no organomegaly Pelvic: cervix normal in appearance, external genitalia normal, no adnexal masses or tenderness, no cervical motion tenderness, perianal skin: no external genital warts noted, rectovaginal septum normal, uterus normal size, shape, and consistency, vagina normal without discharge and No hemorrhoids or bleeding noted. Extremities: extremities normal, atraumatic, no cyanosis or edema Pulses: 2+ and symmetric Skin: Skin color, texture, turgor normal. No rashes or lesions Neurologic: Alert and oriented X 3, normal strength and tone. Normal symmetric reflexes. Normal coordination and gait    Assessment:    Healthy female exam. Manifestations of cirrhosis.     Plan:    Pap today.  Wet prep to evaluate for BV. GC/Ch to rule out STD. Counseled regarding strict condom usage to avoid pregnancy and STD. Offered anoscope to evaluate source of rectal bleeding, but patient refused this evaluation. Blood counts recently checked at [facility] hepatology clinic, will not repeat as patient is resistant to blood draws today. Recent colonoscopy reported, will not repeat this unless bleeding persists. Advised to f/u if persistent.

## 2011-10-07 NOTE — Patient Instructions (Signed)
Do not see any external hemorrhoids today. Possibly some on inside, If you have any more rectal bleeding, will check with a scope in clinic. Will get called or a letter about your pap results next week. Make an appointment for check up once yearly.  Health Maintenance, Females A healthy lifestyle and preventative care can promote health and wellness.  Maintain regular health, dental, and eye exams.   Eat a healthy diet. Foods like vegetables, fruits, whole grains, low-fat dairy products, and lean protein foods contain the nutrients you need without too many calories. Decrease your intake of foods high in solid fats, added sugars, and salt. Get information about a proper diet from your caregiver, if necessary.   Regular physical exercise is one of the most important things you can do for your health. Most adults should get at least 150 minutes of moderate-intensity exercise (any activity that increases your heart rate and causes you to sweat) each week. In addition, most adults need muscle-strengthening exercises on 2 or more days a week.    Maintain a healthy weight. The body mass index (BMI) is a screening tool to identify possible weight problems. It provides an estimate of body fat based on height and weight. Your caregiver can help determine your BMI, and can help you achieve or maintain a healthy weight. For adults 20 years and older:   A BMI below 18.5 is considered underweight.   A BMI of 18.5 to 24.9 is normal.   A BMI of 25 to 29.9 is considered overweight.   A BMI of 30 and above is considered obese.   Maintain normal blood lipids and cholesterol by exercising and minimizing your intake of saturated fat. Eat a balanced diet with plenty of fruits and vegetables. Blood tests for lipids and cholesterol should begin at age 1 and be repeated every 5 years. If your lipid or cholesterol levels are high, you are over 50, or you are a high risk for heart disease, you may need your  cholesterol levels checked more frequently.Ongoing high lipid and cholesterol levels should be treated with medicines if diet and exercise are not effective.   If you smoke, find out from your caregiver how to quit. If you do not use tobacco, do not start.   If you are pregnant, do not drink alcohol. If you are breastfeeding, be very cautious about drinking alcohol. If you are not pregnant and choose to drink alcohol, do not exceed 1 drink per day. One drink is considered to be 12 ounces (355 mL) of beer, 5 ounces (148 mL) of wine, or 1.5 ounces (44 mL) of liquor.   Avoid use of street drugs. Do not share needles with anyone. Ask for help if you need support or instructions about stopping the use of drugs.   High blood pressure causes heart disease and increases the risk of stroke. Blood pressure should be checked at least every 1 to 2 years. Ongoing high blood pressure should be treated with medicines, if weight loss and exercise are not effective.   If you are 89 to 25 years old, ask your caregiver if you should take aspirin to prevent strokes.   Diabetes screening involves taking a blood sample to check your fasting blood sugar level. This should be done once every 3 years, after age 28, if you are within normal weight and without risk factors for diabetes. Testing should be considered at a younger age or be carried out more frequently if you are overweight and  have at least 1 risk factor for diabetes.   Breast cancer screening is essential preventative care for women. You should practice "breast self-awareness." This means understanding the normal appearance and feel of your breasts and may include breast self-examination. Any changes detected, no matter how small, should be reported to a caregiver. Women in their 15s and 30s should have a clinical breast exam (CBE) by a caregiver as part of a regular health exam every 1 to 3 years. After age 63, women should have a CBE every year. Starting at age  62, women should consider having a mammogram (breast X-ray) every year. Women who have a family history of breast cancer should talk to their caregiver about genetic screening. Women at a high risk of breast cancer should talk to their caregiver about having an MRI and a mammogram every year.   The Pap test is a screening test for cervical cancer. Women should have a Pap test starting at age 34. Between ages 80 and 9, Pap tests should be repeated every 2 years. Beginning at age 20, you should have a Pap test every 3 years as long as the past 3 Pap tests have been normal. If you had a hysterectomy for a problem that was not cancer or a condition that could lead to cancer, then you no longer need Pap tests. If you are between ages 44 and 50, and you have had normal Pap tests going back 10 years, you no longer need Pap tests. If you have had past treatment for cervical cancer or a condition that could lead to cancer, you need Pap tests and screening for cancer for at least 20 years after your treatment. If Pap tests have been discontinued, risk factors (such as a new sexual partner) need to be reassessed to determine if screening should be resumed. Some women have medical problems that increase the chance of getting cervical cancer. In these cases, your caregiver may recommend more frequent screening and Pap tests.   The human papillomavirus (HPV) test is an additional test that may be used for cervical cancer screening. The HPV test looks for the virus that can cause the cell changes on the cervix. The cells collected during the Pap test can be tested for HPV. The HPV test could be used to screen women aged 41 years and older, and should be used in women of any age who have unclear Pap test results. After the age of 64, women should have HPV testing at the same frequency as a Pap test.   Colorectal cancer can be detected and often prevented. Most routine colorectal cancer screening begins at the age of 37 and  continues through age 59. However, your caregiver may recommend screening at an earlier age if you have risk factors for colon cancer. On a yearly basis, your caregiver may provide home test kits to check for hidden blood in the stool. Use of a small camera at the end of a tube, to directly examine the colon (sigmoidoscopy or colonoscopy), can detect the earliest forms of colorectal cancer. Talk to your caregiver about this at age 28, when routine screening begins. Direct examination of the colon should be repeated every 5 to 10 years through age 91, unless early forms of pre-cancerous polyps or small growths are found.   Practice safe sex. Use condoms and avoid high-risk sexual practices to reduce the spread of sexually transmitted infections (STIs). Sexually active women aged 3 and younger should be checked for Chlamydia, which is  a common sexually transmitted infection. Older women with new or multiple partners should also be tested for Chlamydia. Testing for other STIs is recommended if you are sexually active and at increased risk.   Osteoporosis is a disease in which the bones lose minerals and strength with aging. This can result in serious bone fractures. The risk of osteoporosis can be identified using a bone density scan. Women ages 52 and over and women at risk for fractures or osteoporosis should discuss screening with their caregivers. Ask your caregiver whether you should be taking a calcium supplement or vitamin D to reduce the rate of osteoporosis.   Menopause can be associated with physical symptoms and risks. Hormone replacement therapy is available to decrease symptoms and risks. You should talk to your caregiver about whether hormone replacement therapy is right for you.   Use sunscreen with a sun protection factor (SPF) of 30 or greater. Apply sunscreen liberally and repeatedly throughout the day. You should seek shade when your shadow is shorter than you. Protect yourself by wearing  long sleeves, pants, a wide-brimmed hat, and sunglasses year round, whenever you are outdoors.   Notify your caregiver of new moles or changes in moles, especially if there is a change in shape or color. Also notify your caregiver if a mole is larger than the size of a pencil eraser.   Stay current with your immunizations.  Document Released: 02/08/2011 Document Revised: 04/07/2011 Document Reviewed: 02/08/2011 [facility] Patient Information 2012 [place], [place].

## 2011-10-08 ENCOUNTER — Telehealth: Payer: Self-pay | Admitting: Family Medicine

## 2011-10-08 NOTE — Telephone Encounter (Signed)
Need to discuss detox admission asap.  Please call patient back at earliest convenience

## 2011-10-11 NOTE — Telephone Encounter (Signed)
I have returned patient call and left VM as she did not answer.

## 2011-10-13 ENCOUNTER — Other Ambulatory Visit: Payer: Self-pay | Admitting: *Deleted

## 2011-10-13 DIAGNOSIS — D693 Immune thrombocytopenic purpura: Secondary | ICD-10-CM

## 2011-10-14 ENCOUNTER — Telehealth: Payer: Self-pay | Admitting: Oncology

## 2011-10-14 ENCOUNTER — Encounter: Payer: Self-pay | Admitting: Family Medicine

## 2011-10-14 NOTE — Telephone Encounter (Signed)
pt rtn call and scheduled appts for march and april2013

## 2011-10-18 ENCOUNTER — Other Ambulatory Visit: Payer: Medicare Other | Admitting: Lab

## 2011-10-26 ENCOUNTER — Telehealth: Payer: Self-pay | Admitting: Family Medicine

## 2011-10-26 NOTE — Telephone Encounter (Signed)
lvm and  told pt that I will forward this to Dr. Cristal Ford for advice. The last time that she was in the office was 2/28 and I am unsure if she will have to come in to be treated for this or if she will just call in the Rx for her.forwarded to pcpBarkley, Viann Shove

## 2011-10-26 NOTE — Telephone Encounter (Signed)
Pt states that she used the vaginal cream for 5 days but she still has an odor.  Wants to know if she can have a pill called in to help this.  [phone] [address]

## 2011-10-27 ENCOUNTER — Other Ambulatory Visit: Payer: Self-pay | Admitting: Family Medicine

## 2011-10-27 MED ORDER — METRONIDAZOLE 250 MG PO TABS: 250.0000 mg | ORAL_TABLET | Freq: Three times a day (TID) | ORAL | Status: AC

## 2011-10-27 NOTE — Telephone Encounter (Signed)
lvm and informed pt that rx has been sent to [facility] [address].[name] [place]

## 2011-10-27 NOTE — Telephone Encounter (Signed)
She has BV. I will call in a prescription for flagyl. Please notify patient she can pick up at [facility].

## 2011-10-29 ENCOUNTER — Encounter: Payer: Self-pay | Admitting: *Deleted

## 2011-10-29 NOTE — Progress Notes (Signed)
ON 10/27/11 RECEIVED A FAX FROM [employer] CONCERNING A CONFIRMATION OF PRESCRIPTION SHIPMENT FOR PROMACTA.

## 2011-11-08 DIAGNOSIS — F1910 Other psychoactive substance abuse, uncomplicated: Secondary | ICD-10-CM

## 2011-11-08 HISTORY — DX: Other psychoactive substance abuse, uncomplicated: F19.10

## 2011-11-24 ENCOUNTER — Other Ambulatory Visit: Payer: Self-pay | Admitting: *Deleted

## 2011-11-24 DIAGNOSIS — D696 Thrombocytopenia, unspecified: Secondary | ICD-10-CM

## 2011-11-24 MED ORDER — ELTROMBOPAG OLAMINE 50 MG PO TABS: 50.0000 mg | ORAL_TABLET | Freq: Every day | ORAL | Status: DC

## 2011-11-26 ENCOUNTER — Encounter: Payer: Self-pay | Admitting: Oncology

## 2011-11-26 NOTE — Progress Notes (Signed)
Called [facility] @ [phone], id # [id number], for promacta pa; approved from 11/05/11-11/25/12.

## 2011-11-30 ENCOUNTER — Other Ambulatory Visit (HOSPITAL_BASED_OUTPATIENT_CLINIC_OR_DEPARTMENT_OTHER): Payer: Medicare Other | Admitting: Lab

## 2011-11-30 ENCOUNTER — Ambulatory Visit: Payer: Medicare Other | Admitting: Oncology

## 2011-11-30 DIAGNOSIS — D693 Immune thrombocytopenic purpura: Secondary | ICD-10-CM

## 2011-11-30 LAB — CBC WITH DIFFERENTIAL/PLATELET
BASO%: 0.5 % (ref 0.0–2.0)
EOS%: 4.6 % (ref 0.0–7.0)
MCH: 35.8 pg — ABNORMAL HIGH (ref 25.1–34.0)
MCHC: 34.1 g/dL (ref 31.5–36.0)
MCV: 105.1 fL — ABNORMAL HIGH (ref 79.5–101.0)
MONO%: 8.1 % (ref 0.0–14.0)
RBC: 3.45 10*6/uL — ABNORMAL LOW (ref 3.70–5.45)
RDW: 17.4 % — ABNORMAL HIGH (ref 11.2–14.5)
nRBC: 0 % (ref 0–0)

## 2011-12-02 ENCOUNTER — Encounter: Payer: Self-pay | Admitting: *Deleted

## 2011-12-02 NOTE — Progress Notes (Signed)
RECEIVED A FAX FROM [employer] CONCERNING A CONFIRMATION OF PRESCRIPTION SHIPMENT FOR PROMACTA 

## 2011-12-08 ENCOUNTER — Telehealth: Payer: Self-pay | Admitting: *Deleted

## 2011-12-08 DIAGNOSIS — D696 Thrombocytopenia, unspecified: Secondary | ICD-10-CM

## 2011-12-08 NOTE — Telephone Encounter (Signed)
Left message on voicemail for pt to call office.  

## 2011-12-08 NOTE — Telephone Encounter (Signed)
Message copied by Caleb Popp on Wed Dec 08, 2011 12:00 PM ------      Message from: Thornton Papas B      Created: Wed Dec 01, 2011  7:16 PM       Please call patient, platelets are normal, continue promacta.  Schedule appt. And cbc 4-6 weeks sherrill or lisa.  She needs to stay for this appt.

## 2011-12-21 ENCOUNTER — Telehealth: Payer: Self-pay | Admitting: Oncology

## 2011-12-21 NOTE — Telephone Encounter (Signed)
BS now on call 5/20 and f/u was moved from PM to AM. lmonvm for pt re change but then called cell. S/w pt and per pt she cannot come in the am and was givne new appt for 5/24 @ 3:30 pm.

## 2011-12-27 ENCOUNTER — Ambulatory Visit: Payer: Medicare Other | Admitting: Oncology

## 2011-12-29 ENCOUNTER — Telehealth: Payer: Self-pay | Admitting: *Deleted

## 2011-12-29 NOTE — Telephone Encounter (Signed)
Called pt, requested she change appt to 5/24 at 1415. She agrees to do so.

## 2011-12-30 ENCOUNTER — Telehealth: Payer: Self-pay | Admitting: Oncology

## 2011-12-30 NOTE — Telephone Encounter (Signed)
called pt lmovm that her appt time was moved to 02:15pm for 05/24.  asked pt to rtn call to confirm appt time

## 2011-12-31 ENCOUNTER — Ambulatory Visit: Payer: Medicare Other | Admitting: Oncology

## 2011-12-31 ENCOUNTER — Ambulatory Visit: Payer: Medicare Other | Admitting: Nurse Practitioner

## 2012-01-04 ENCOUNTER — Other Ambulatory Visit: Payer: Self-pay | Admitting: *Deleted

## 2012-01-04 ENCOUNTER — Ambulatory Visit: Payer: Medicare Other | Admitting: Nurse Practitioner

## 2012-01-04 NOTE — Telephone Encounter (Signed)
THIS REQUEST WAS GIVEN TO LISA THOMAS,NP.

## 2012-01-06 ENCOUNTER — Telehealth: Payer: Self-pay | Admitting: Oncology

## 2012-01-06 NOTE — Telephone Encounter (Signed)
pt called and rs missed appt on 05/28 to 06/14

## 2012-01-07 ENCOUNTER — Telehealth: Payer: Self-pay | Admitting: *Deleted

## 2012-01-07 NOTE — Telephone Encounter (Signed)
[employer] faxed confirmation of Promacta shipment on 01-07-12 with next business day delivery.

## 2012-01-21 ENCOUNTER — Ambulatory Visit: Payer: Medicare Other | Admitting: Nurse Practitioner

## 2012-01-31 ENCOUNTER — Telehealth: Payer: Self-pay | Admitting: *Deleted

## 2012-01-31 NOTE — Telephone Encounter (Signed)
[employer] faxed Promacta refill request.  Request to MD for review.

## 2012-02-03 ENCOUNTER — Other Ambulatory Visit: Payer: Self-pay | Admitting: *Deleted

## 2012-02-03 DIAGNOSIS — D696 Thrombocytopenia, unspecified: Secondary | ICD-10-CM

## 2012-02-03 MED ORDER — ELTROMBOPAG OLAMINE 50 MG PO TABS: 50.0000 mg | ORAL_TABLET | Freq: Every day | ORAL | Status: DC

## 2012-03-13 ENCOUNTER — Telehealth: Payer: Self-pay | Admitting: Family Medicine

## 2012-03-13 NOTE — Telephone Encounter (Signed)
Patient is calling to discuss some symptoms she is having.  She has had swelling in her Lymph nodes that is blocking her hearing and affecting her swallowing.

## 2012-03-13 NOTE — Telephone Encounter (Signed)
Spoke with patient and she states this happens off and on. Happened last night but this AM was better. Ask for appointment Wednesday. Appointment scheduled.

## 2012-03-15 ENCOUNTER — Encounter: Payer: Self-pay | Admitting: Family Medicine

## 2012-03-15 ENCOUNTER — Ambulatory Visit (INDEPENDENT_AMBULATORY_CARE_PROVIDER_SITE_OTHER): Payer: Medicare Other | Admitting: Family Medicine

## 2012-03-15 VITALS — BP 146/82 | HR 88 | Temp 98.2°F | Ht 60.0 in | Wt 152.3 lb

## 2012-03-15 DIAGNOSIS — R221 Localized swelling, mass and lump, neck: Secondary | ICD-10-CM

## 2012-03-15 DIAGNOSIS — R220 Localized swelling, mass and lump, head: Secondary | ICD-10-CM

## 2012-03-15 MED ORDER — EPINEPHRINE 0.3 MG/0.3ML IJ DEVI: 0.3000 mg | Freq: Once | INTRAMUSCULAR | Status: DC

## 2012-03-15 NOTE — Patient Instructions (Signed)
If you have another episode like this one where you have problems breathing, use the Epi Pen and come the the ER immediately.  (You will inject it into your leg)

## 2012-03-15 NOTE — Progress Notes (Signed)
Patient ID: [name], female   DOB: May 21, 1986, 26 y.o.   MRN: [id number] Subjective: The patient is a 26 y.o. year old female who presents today for facial swelling.  1. Facial swelling: Patient reports that 2 days ago she had sudden onset of right-sided facial swelling while lying down at night watching TV. Swelling he began in the right side of her face and progressed to involve all of the right side of her face and some of her neck. Was accompanied by ringing in her right ear, difficulty breathing, and inability to swallow. He went on for several hours and subsided spontaneously. The patient is not taking medication for this. The patient had one similar episode about a year ago. She has not had any new foods that day and does not note any new environmental exposures. Currently she is not having any symptoms.  Patient's past medical, social, and family history were reviewed and updated as appropriate. History  Substance Use Topics  . Smoking status: Former Games developer  . Smokeless tobacco: Not on file  . Alcohol Use: Yes     drinks occassioanlly   Objective:  Filed Vitals:   03/15/12 1630  BP: 146/82  Pulse: 88  Temp: 98.2 F (36.8 C)   Gen: Jaundiced, appropriate white female, no distress HEENT: There is a slight fullness on the right side of face but no palpable adenopathy. Pharynx is nonerythematous with no evidence of swelling and good dentition. There is no significant pain on palpation of the face. There were no obvious bite marks.  Assessment/Plan:  Please also see individual problems in problem list for problem-specific plans.

## 2012-03-15 NOTE — Assessment & Plan Note (Signed)
The patient's symptoms are relatively concerning especially since she reports some problems with breathing. The most likely inciting factor is an allergen. I will refer for allergy testing we'll also provide patient with an EpiPen and instructions on use.

## 2012-04-27 ENCOUNTER — Encounter: Payer: Self-pay | Admitting: Gastroenterology

## 2012-05-16 ENCOUNTER — Telehealth: Payer: Self-pay | Admitting: Oncology

## 2012-05-16 NOTE — Telephone Encounter (Signed)
Pt called and r/s appt that was missed , pt will see ML on 10/10

## 2012-05-18 ENCOUNTER — Telehealth: Payer: Self-pay | Admitting: Oncology

## 2012-05-18 ENCOUNTER — Other Ambulatory Visit: Payer: Self-pay | Admitting: *Deleted

## 2012-05-18 ENCOUNTER — Ambulatory Visit: Payer: Medicare Other | Admitting: Nurse Practitioner

## 2012-05-18 NOTE — Telephone Encounter (Signed)
called pt to r/s 10/10 appt to 10/11 per her request   aom

## 2012-05-19 ENCOUNTER — Telehealth: Payer: Self-pay | Admitting: Oncology

## 2012-05-19 ENCOUNTER — Ambulatory Visit: Payer: Medicare Other | Admitting: Nurse Practitioner

## 2012-05-19 NOTE — Telephone Encounter (Signed)
pt called and r/s her appt again to 10/15

## 2012-05-23 ENCOUNTER — Other Ambulatory Visit: Payer: Self-pay

## 2012-05-23 ENCOUNTER — Other Ambulatory Visit: Payer: Self-pay | Admitting: Nurse Practitioner

## 2012-05-23 ENCOUNTER — Telehealth: Payer: Self-pay | Admitting: Oncology

## 2012-05-23 ENCOUNTER — Telehealth: Payer: Self-pay | Admitting: *Deleted

## 2012-05-23 ENCOUNTER — Telehealth: Payer: Self-pay | Admitting: Nurse Practitioner

## 2012-05-23 ENCOUNTER — Ambulatory Visit (HOSPITAL_BASED_OUTPATIENT_CLINIC_OR_DEPARTMENT_OTHER): Payer: Medicare Other

## 2012-05-23 ENCOUNTER — Telehealth: Payer: Self-pay | Admitting: Gastroenterology

## 2012-05-23 ENCOUNTER — Ambulatory Visit (HOSPITAL_BASED_OUTPATIENT_CLINIC_OR_DEPARTMENT_OTHER): Payer: Medicare Other | Admitting: Nurse Practitioner

## 2012-05-23 VITALS — BP 136/86 | HR 107 | Temp 96.9°F | Resp 20 | Ht 60.0 in | Wt 165.7 lb

## 2012-05-23 DIAGNOSIS — D6959 Other secondary thrombocytopenia: Secondary | ICD-10-CM

## 2012-05-23 DIAGNOSIS — D696 Thrombocytopenia, unspecified: Secondary | ICD-10-CM

## 2012-05-23 DIAGNOSIS — E876 Hypokalemia: Secondary | ICD-10-CM

## 2012-05-23 DIAGNOSIS — E8779 Other fluid overload: Secondary | ICD-10-CM

## 2012-05-23 DIAGNOSIS — K7460 Unspecified cirrhosis of liver: Secondary | ICD-10-CM

## 2012-05-23 LAB — CBC WITH DIFFERENTIAL/PLATELET
BASO%: 0.5 % (ref 0.0–2.0)
LYMPH%: 11 % — ABNORMAL LOW (ref 14.0–49.7)
MCHC: 34.1 g/dL (ref 31.5–36.0)
MONO#: 0.3 10*3/uL (ref 0.1–0.9)
Platelets: 99 10*3/uL — ABNORMAL LOW (ref 145–400)
RBC: 3.18 10*6/uL — ABNORMAL LOW (ref 3.70–5.45)
WBC: 4.1 10*3/uL (ref 3.9–10.3)
nRBC: 0 % (ref 0–0)

## 2012-05-23 LAB — COMPREHENSIVE METABOLIC PANEL (CC13)
ALT: 70 U/L — ABNORMAL HIGH (ref 0–55)
AST: 290 U/L — ABNORMAL HIGH (ref 5–34)
Calcium: 8 mg/dL — ABNORMAL LOW (ref 8.4–10.4)
Chloride: 96 mEq/L — ABNORMAL LOW (ref 98–107)
Creatinine: 0.7 mg/dL (ref 0.6–1.1)
Total Bilirubin: 13.7 mg/dL — ABNORMAL HIGH (ref 0.20–1.20)

## 2012-05-23 MED ORDER — POTASSIUM CHLORIDE CRYS ER 20 MEQ PO TBCR: EXTENDED_RELEASE_TABLET | ORAL | Status: DC

## 2012-05-23 NOTE — Telephone Encounter (Signed)
I will see patient.  

## 2012-05-23 NOTE — Progress Notes (Signed)
OFFICE PROGRESS NOTE  Interval history:  [name] is a 26 year old woman with thrombocytopenia secondary to cirrhosis, hypersplenism and potentially ITP. She was previously maintained on Promacta. She has missed multiple followup visits.  She agreed to come in for labs and an office visit today in order to obtain a refill for the promacta.  She ran out of Promacta approximately 1 month ago. She has intermittent nosebleeds. She occasionally notes a small amount of bright red blood with bowel movements. Earlier today she noted bleeding in her mouth.  She has increased leg swelling and abdominal "bloating". She is taking Lasix. She admits to taking more than what has been prescribed. She is not taking potassium.  She has significant "cramps" in the hands and feet.  She has not kept appointments with [name] at [facility]. She states she is extremely busy with school and work.  She denies any seizures. She continues Keppra. She has periodic headaches. No diplopia.   Objective: Blood pressure 136/86, pulse 107, temperature 96.9 F (36.1 C), temperature source Oral, resp. rate 20, height 5' (1.524 m), weight 165 lb 11.2 oz (75.161 kg).  Scleral icterus. Lungs clear. Irregular cardiac rhythm. Abdomen is distended. Spleen is palpable in the left upper quadrant. Pitting edema throughout both legs.  Lab Results: Lab Results  Component Value Date   WBC 4.1 05/23/2012   HGB 10.7* 05/23/2012   HCT 31.4* 05/23/2012   MCV 98.7 05/23/2012   PLT 99* 05/23/2012    Chemistry:    Chemistry      Component Value Date/Time   NA 137 05/23/2012 1448   NA 137 06/28/2011 1630   K 2.5 Repeated and Verified* 05/23/2012 1448   K 4.1 06/28/2011 1630   CL 96* 05/23/2012 1448   CL 99 06/28/2011 1630   CO2 32* 05/23/2012 1448   CO2 32 06/28/2011 1630   BUN 7.0 05/23/2012 1448   BUN 11 06/28/2011 1630   CREATININE 0.7 05/23/2012 1448   CREATININE 0.68 06/28/2011 1630   CREATININE 0.55 06/22/2011 1823      Component Value Date/Time   CALCIUM 8.0* 05/23/2012 1448   CALCIUM 7.8* 06/28/2011 1630   ALKPHOS 1,376* 05/23/2012 1448   ALKPHOS 688* 06/28/2011 1630   AST 290* 05/23/2012 1448   AST 162* 06/28/2011 1630   ALT 70* 05/23/2012 1448   ALT 87* 06/28/2011 1630   BILITOT 13.70* 05/23/2012 1448   BILITOT 5.7* 06/28/2011 1630       Studies/Results: No results found.  Medications: I have reviewed the patient's current medications.  Assessment/Plan:  1. 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 She has been followed by [name] at [facility] and by [name] locally. 2. Thrombocytopenia secondary to cirrhosis, hypersplenism and potentially ITP. She has been off of Promacta for approximately one month. She has mild thrombocytopenia on labs today. 3. History of a seizure disorder. She is maintained on Keppra. 4. History of intracerebral hemorrhage in the setting of severe thrombocytopenia. 5. History of an "aneurysm" followed by neurosurgery at [facility]. 6. 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 7. History of intermittent headaches. 8. History of a "bruit" in the left and right low neck/supraclavicular fossa. 9. History of amenorrhea status post GYN evaluation. 10. Admission 03/10/2009 with acute onset right is. 11. Admission 01/09/2010 for post ERCP pain with pancreatitis suspected. 12. Status post ERCP 01/09/2010 with findings consistent with sclerosing cholangitis. 13. Hypokalemia. We will contact her with instructions to take 60 mEq of potassium today, 40 mEq on 05/24/2012. We will repeat a  basic metabolic panel on 05/25/2012. 14. Hand and foot cramps. May be related to hypokalemia.  15. Irregular heart rate. EKG showed PACs and PVCs. 16. Medical noncompliance.  Disposition-[name] has been noncompliant with followup in our office as well as with her other providers. We stressed the importance of routine lab monitoring and office visits. She agrees to return as scheduled.  She has  been off of Promacta for approximately one month. She has mild thrombocytopenia on labs today. We will continue to monitor the platelet count off of Promacta.  She appears to have volume overload. [name] recommends an urgent referral to [name] for assistance with management. We submitted a referral request at today's visit.  She has significant hypokalemia in the setting of diuretic use. We will contact her with instructions for potassium replacement as outlined above.  We will schedule a lab visit on 05/25/2012 to repeat the potassium level. She will otherwise return for labs in 2 weeks and an office visit in 4 weeks. She will contact the office the interim with any problems.  Patient seen with [name].    [name] ANP/GNP-BC

## 2012-05-23 NOTE — Telephone Encounter (Signed)
I notified [name] of potassium level 2.5. A prescription for Kdur was escribed to her pharmacy. I stressed the importance of her returning for followup labs on 05/25/2012. She stated her intention to do so.

## 2012-05-23 NOTE — Telephone Encounter (Signed)
Dr. Arlyce Dice this pt has not been seen in a while, have several missed appts. Pt has cirrhosis and ?hepatitis. Please review chart and let us know if you will see pt. Please advise.

## 2012-05-23 NOTE — Telephone Encounter (Signed)
Gave pt appt for November 2013 lab and MD, talked to [facility], I was informed that they do not take patient with hepatitis. Pt has not been seen for 3 years and has several missed appts. They will review notes on EPIC and will call patients if patient is appropriate for GI. Nurse notified

## 2012-05-24 ENCOUNTER — Telehealth: Payer: Self-pay | Admitting: Oncology

## 2012-05-24 NOTE — Telephone Encounter (Signed)
Appointment available for pt for 05/25/12 but pt will not return phone call.  Called pt again and offered appt for tomorrow but pt states she cannot come, she has class. Pt scheduled to see Dr. Arlyce Dice 06/02/12@2 :30pm. Pt aware of appt date and time.

## 2012-05-24 NOTE — Telephone Encounter (Signed)
Called patient and left message regarding appt with GI, reminded patient that Corinda Gubler , charge patient $50 for no show and not covered by insurance

## 2012-05-24 NOTE — Telephone Encounter (Signed)
Talked to patient and gave her appt for GI Dr. Arlyce Dice and she is aware of lab appt on 10/17th

## 2012-05-24 NOTE — Telephone Encounter (Signed)
Left message for pt to call back. Called home and mobile numbers.

## 2012-05-25 ENCOUNTER — Telehealth: Payer: Self-pay | Admitting: Oncology

## 2012-05-25 ENCOUNTER — Other Ambulatory Visit: Payer: Self-pay | Admitting: Nurse Practitioner

## 2012-05-25 ENCOUNTER — Telehealth: Payer: Self-pay | Admitting: *Deleted

## 2012-05-25 ENCOUNTER — Other Ambulatory Visit (HOSPITAL_BASED_OUTPATIENT_CLINIC_OR_DEPARTMENT_OTHER): Payer: Medicare Other | Admitting: Lab

## 2012-05-25 DIAGNOSIS — E876 Hypokalemia: Secondary | ICD-10-CM

## 2012-05-25 LAB — BASIC METABOLIC PANEL (CC13)
BUN: 8 mg/dL (ref 7.0–26.0)
CO2: 33 mEq/L — ABNORMAL HIGH (ref 22–29)
Chloride: 98 mEq/L (ref 98–107)
Creatinine: 0.7 mg/dL (ref 0.6–1.1)
Glucose: 111 mg/dl — ABNORMAL HIGH (ref 70–99)

## 2012-05-25 NOTE — Telephone Encounter (Signed)
Notified that K+ level is now low normal. Continue Ktab 20 meq bid X 2 days, then one daily. Recheck lab on Monday. Scheduler will call. She requests her labs be forwarded to Dr. Piedad Climes at [facility].

## 2012-05-25 NOTE — Telephone Encounter (Signed)
Talked to patient gave her appt for 10/21 and 10/29 labs, she is aware of appt on November 2013

## 2012-05-29 ENCOUNTER — Other Ambulatory Visit: Payer: Medicare Other | Admitting: Lab

## 2012-05-30 ENCOUNTER — Other Ambulatory Visit (HOSPITAL_BASED_OUTPATIENT_CLINIC_OR_DEPARTMENT_OTHER): Payer: Medicare Other | Admitting: Lab

## 2012-05-30 DIAGNOSIS — E876 Hypokalemia: Secondary | ICD-10-CM

## 2012-05-30 DIAGNOSIS — D693 Immune thrombocytopenic purpura: Secondary | ICD-10-CM

## 2012-05-30 LAB — CBC WITH DIFFERENTIAL/PLATELET
EOS%: 3 % (ref 0.0–7.0)
MCH: 36.4 pg — ABNORMAL HIGH (ref 25.1–34.0)
MCHC: 34.9 g/dL (ref 31.5–36.0)
MCV: 104.3 fL — ABNORMAL HIGH (ref 79.5–101.0)
MONO%: 6.7 % (ref 0.0–14.0)
RBC: 2.9 10*6/uL — ABNORMAL LOW (ref 3.70–5.45)
RDW: 17.7 % — ABNORMAL HIGH (ref 11.2–14.5)

## 2012-05-30 LAB — BASIC METABOLIC PANEL (CC13)
BUN: 7 mg/dL (ref 7.0–26.0)
CO2: 28 mEq/L (ref 22–29)
Chloride: 103 mEq/L (ref 98–107)
Potassium: 3.4 mEq/L — ABNORMAL LOW (ref 3.5–5.1)

## 2012-05-31 ENCOUNTER — Telehealth: Payer: Self-pay | Admitting: *Deleted

## 2012-05-31 NOTE — Telephone Encounter (Signed)
MD review of Bmet results: continue Ktab 20 meq daily and follow up on 10/29 as scheduled. Left VM at home/cell #'s to call office regarding lab results.

## 2012-06-01 ENCOUNTER — Telehealth: Payer: Self-pay | Admitting: *Deleted

## 2012-06-01 NOTE — Telephone Encounter (Signed)
Pt returned call, instructed her to continue Potassium and return 06/06/12 for lab. She voiced understanding.

## 2012-06-02 ENCOUNTER — Ambulatory Visit: Payer: Medicare Other | Admitting: Gastroenterology

## 2012-06-06 ENCOUNTER — Telehealth: Payer: Self-pay | Admitting: *Deleted

## 2012-06-06 ENCOUNTER — Other Ambulatory Visit: Payer: Medicare Other | Admitting: Lab

## 2012-06-06 NOTE — Telephone Encounter (Signed)
Per patient request I have moved lab appt from today to Thrusday.  JMW

## 2012-06-08 ENCOUNTER — Other Ambulatory Visit: Payer: Medicare Other

## 2012-06-13 ENCOUNTER — Telehealth: Payer: Self-pay | Admitting: Oncology

## 2012-06-13 ENCOUNTER — Telehealth: Payer: Self-pay | Admitting: *Deleted

## 2012-06-13 NOTE — Telephone Encounter (Signed)
Pt called , returned call, pt wants to draw labs on 11/8, for the labs that was missed on 10/31, called [facility] and to make sure MD needs lab since pt has appt on 11/12 lab and MD

## 2012-06-13 NOTE — Telephone Encounter (Signed)
Spoke with Rose in scheduling--Per Lonna Cobb, NP pt does not need to come in 06/16/12 for labs; next lab/MD appt 06/20/12.

## 2012-06-16 ENCOUNTER — Ambulatory Visit: Payer: Medicare Other | Admitting: Family Medicine

## 2012-06-20 ENCOUNTER — Other Ambulatory Visit (HOSPITAL_BASED_OUTPATIENT_CLINIC_OR_DEPARTMENT_OTHER): Payer: Medicare Other | Admitting: Lab

## 2012-06-20 ENCOUNTER — Telehealth: Payer: Self-pay | Admitting: Oncology

## 2012-06-20 ENCOUNTER — Ambulatory Visit: Payer: Medicare Other | Admitting: Family Medicine

## 2012-06-20 ENCOUNTER — Ambulatory Visit (HOSPITAL_BASED_OUTPATIENT_CLINIC_OR_DEPARTMENT_OTHER): Payer: Medicare Other | Admitting: Oncology

## 2012-06-20 VITALS — BP 138/78 | HR 78 | Temp 97.0°F | Resp 20 | Ht 64.0 in | Wt 170.2 lb

## 2012-06-20 DIAGNOSIS — B199 Unspecified viral hepatitis without hepatic coma: Secondary | ICD-10-CM

## 2012-06-20 DIAGNOSIS — D696 Thrombocytopenia, unspecified: Secondary | ICD-10-CM

## 2012-06-20 DIAGNOSIS — D6959 Other secondary thrombocytopenia: Secondary | ICD-10-CM

## 2012-06-20 DIAGNOSIS — K7460 Unspecified cirrhosis of liver: Secondary | ICD-10-CM

## 2012-06-20 DIAGNOSIS — E876 Hypokalemia: Secondary | ICD-10-CM

## 2012-06-20 LAB — COMPREHENSIVE METABOLIC PANEL (CC13)
Albumin: 1.5 g/dL — ABNORMAL LOW (ref 3.5–5.0)
BUN: 8 mg/dL (ref 7.0–26.0)
CO2: 34 mEq/L — ABNORMAL HIGH (ref 22–29)
Calcium: 7.9 mg/dL — ABNORMAL LOW (ref 8.4–10.4)
Chloride: 95 mEq/L — ABNORMAL LOW (ref 98–107)
Glucose: 179 mg/dl — ABNORMAL HIGH (ref 70–99)
Potassium: 2.8 mEq/L — ABNORMAL LOW (ref 3.5–5.1)

## 2012-06-20 LAB — CBC WITH DIFFERENTIAL/PLATELET
Basophils Absolute: 0 10*3/uL (ref 0.0–0.1)
Eosinophils Absolute: 0.1 10*3/uL (ref 0.0–0.5)
HGB: 10.8 g/dL — ABNORMAL LOW (ref 11.6–15.9)
LYMPH%: 8.2 % — ABNORMAL LOW (ref 14.0–49.7)
MCV: 108.7 fL — ABNORMAL HIGH (ref 79.5–101.0)
MONO%: 8.6 % (ref 0.0–14.0)
NEUT#: 3.1 10*3/uL (ref 1.5–6.5)
Platelets: 106 10*3/uL — ABNORMAL LOW (ref 145–400)
RBC: 2.81 10*6/uL — ABNORMAL LOW (ref 3.70–5.45)

## 2012-06-20 NOTE — Progress Notes (Signed)
    [facility]    OFFICE PROGRESS NOTE   INTERVAL HISTORY:   She returns as scheduled. She remains off of promacta. No bleeding. She complains of increased abdominal swelling and leg edema. She takes Lasix twice daily. She is taking potassium.  She did not see Dr. Arlyce Dice. Ms. Gagen reports she was not aware of the appointment.  Objective:  Vital signs in last 24 hours:  Blood pressure 138/78, pulse 78, temperature 97 F (36.1 C), temperature source Oral, resp. rate 20, height 5\' 4"  (1.626 m), weight 170 lb 3.2 oz (77.202 kg).    HEENT: Scleral icterus Resp: Lungs clear bilaterally Cardio: Regular rate and rhythm GI: The abdomen is distended, no hepatomegaly, the spleen is palpable in the left mid abdomen Vascular: 2+ bilateral leg edema to the upper thigh Skin: Jaundice     Lab Results:  Lab Results  Component Value Date   WBC 3.9 06/20/2012   HGB 10.8* 06/20/2012   HCT 30.6* 06/20/2012   MCV 108.7* 06/20/2012   PLT 106* 06/20/2012   ANC 3.1    Medications: I have reviewed the patient's current medications.  Assessment/Plan: 1. 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 She has been followed by Dr. Piedad Climes at [facility] and by Dr. Arlyce Dice locally. 2. Thrombocytopenia secondary to cirrhosis, hypersplenism and potentially ITP. She has been off of Promacta for approximately 2 months , the platelet count is stable 3. History of a seizure disorder. She is maintained on Keppra. 4. History of intracerebral hemorrhage in the setting of severe thrombocytopenia. 5. History of an "aneurysm" followed by neurosurgery at [facility]. 6. 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 7. History of intermittent headaches. 8. History of a "bruit" in the left and right low neck/supraclavicular fossa. 9. History of amenorrhea status post GYN evaluation. 10. Admission 03/10/2009 with acute gastritis 11. Admission 01/09/2010 for post ERCP pain with pancreatitis suspected. 12. Status post  ERCP 01/09/2010 with findings consistent with sclerosing cholangitis. 13. Hypokalemia. We will contact her with instructions to take 60 mEq of potassium today, 40 mEq on 05/24/2012. We will repeat a basic metabolic panel on 05/25/2012. 14. History of Hand and foot cramps. May be related to hypokalemia.  15. Irregular heart rate. EKG showed PACs and PVCs. 16. Medical noncompliance. 17. Leg edema/weight gain-secondary to decompensated cirrhosis with volume overload.   Disposition:  She is stable from a hematologic standpoint. We will followup on the potassium level from today. She will remain off of Promacta.  We increased the Lasix to 40 mg twice daily. She is scheduled for a chemistry panel in 2 weeks. I strongly encouraged her to followup with Dr. Arlyce Dice for management of the cirrhosis/volume overload. She stated that she would attend an appointment with Dr. Arlyce Dice.  She will return for an office visit here in one month.   Thornton Papas, MD  06/20/2012  1:05 PM

## 2012-06-20 NOTE — Telephone Encounter (Signed)
gv and printed appt schedule for pt for NOV and Dec for labs and appt....scheduled gastro with [facility] for 11.14.13 @ 2:45pm with Dr. Arlyce Dice.

## 2012-06-22 ENCOUNTER — Encounter: Payer: Self-pay | Admitting: Gastroenterology

## 2012-06-22 ENCOUNTER — Ambulatory Visit (INDEPENDENT_AMBULATORY_CARE_PROVIDER_SITE_OTHER): Payer: Medicare Other | Admitting: Gastroenterology

## 2012-06-22 VITALS — BP 138/60 | HR 76 | Ht 63.25 in | Wt 169.2 lb

## 2012-06-22 DIAGNOSIS — K7460 Unspecified cirrhosis of liver: Secondary | ICD-10-CM

## 2012-06-22 DIAGNOSIS — R188 Other ascites: Secondary | ICD-10-CM

## 2012-06-22 DIAGNOSIS — K8301 Primary sclerosing cholangitis: Secondary | ICD-10-CM

## 2012-06-22 DIAGNOSIS — I8500 Esophageal varices without bleeding: Secondary | ICD-10-CM

## 2012-06-22 DIAGNOSIS — K8309 Other cholangitis: Secondary | ICD-10-CM

## 2012-06-22 DIAGNOSIS — K754 Autoimmune hepatitis: Secondary | ICD-10-CM

## 2012-06-22 MED ORDER — SPIRONOLACTONE 100 MG PO TABS: 100.0000 mg | ORAL_TABLET | Freq: Two times a day (BID) | ORAL | Status: DC

## 2012-06-22 MED ORDER — FUROSEMIDE 40 MG PO TABS: 40.0000 mg | ORAL_TABLET | Freq: Every day | ORAL | Status: DC

## 2012-06-22 NOTE — Progress Notes (Signed)
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 She is followed at the [facility] transplant clinic. She is on Imuran. In the last few weeks she has noticed increasing abdominal girth, weight gain and jaundice. She is without pain. Endoscopy in 2011 demonstrated grade 1 varices. ERCP showed changes in the intrahepatic ducts consistent with primary sclerosing cholangitis.    Past Medical History  Diagnosis Date  . Substance abuse   . Autoimmune hepatitis   . Esophageal varices   . Cirrhosis of liver    History reviewed. No pertinent past surgical history. family history is negative for Cancer. Current Outpatient Prescriptions  Medication Sig Dispense Refill  . azaTHIOprine (IMURAN) 50 MG tablet Take 50 mg by mouth daily. Per [facility]      . EPINEPHrine (EPI-PEN) 0.3 mg/0.3 mL DEVI Inject 0.3 mLs (0.3 mg total) into the muscle once.  1 Device  0  . furosemide (LASIX) 40 MG tablet Take 40 mg by mouth 2 (two) times daily.      [name] HYDROcodone-acetaminophen (VICODIN) 5-500 MG per tablet Take 1 tablet by mouth every 6 (six) hours as needed. Prescribed by PCP      . Lactulose 20 GM/30ML SOLN Take 15 mLs by mouth daily.       [name] levETIRAcetam (KEPPRA) 750 MG tablet Take 750 mg by mouth 2 (two) times daily.        . nadolol (CORGARD) 20 MG tablet Take 20 mg by mouth daily.        [name] omeprazole (PRILOSEC) 20 MG capsule Take 1 capsule (20 mg total) by mouth daily.  30 capsule  prn  . potassium chloride SA (K-DUR,KLOR-CON) 20 MEQ tablet Take 20 mEq by mouth daily.      . [DISCONTINUED] potassium chloride SA (K-DUR,KLOR-CON) 20 MEQ tablet Take 2 tablets now (total ), 1 tablet ( ) at bedtime tonight and 1 tablet (20 meq) twice daily on 05/24/12. Repeat labs 05/25/12.  30 tablet  1   Current Facility-Administered Medications    Medication Dose Route Frequency Provider Last Rate Last Dose  . TDaP (BOOSTRIX) injection 0.5 mL  0.5 mL Intramuscular Once Durwin Reges, MD      . tuberculin injection 5 Units  5 Units Intradermal Once Durwin Reges, MD       Allergies as of 06/22/2012 - Review Complete 06/22/2012  Allergen Reaction Noted  . Ibuprofen Shortness Of Breath     reports that she quit smoking about 6 years ago. Her smoking use included Cigarettes. She has never used smokeless tobacco. She reports that she drinks alcohol. She reports that she does not use illicit drugs.     Review of Systems: Pertinent positive and negative review of systems were noted in the above HPI section. All other review of systems were otherwise negative.  Vital signs were reviewed in today's medical record Physical Exam: General: She is a slightly ill-appearing no acute distress Head: Normocephalic and atraumatic Eyes:  sclerae icteris, EOMI Ears: Normal auditory acuity Mouth: No deformity or lesions Neck: Supple, no masses or thyromegaly Lungs: Clear throughout to auscultation Heart: Regular rate and rhythm; no murmurs, rubs or bruits Abdomen: Soft, slightly distended. There is a suggestion of ascites. Liver is palpable 2 fingerbreadths below the right costal margin. Rectal:deferred Musculoskeletal: Symmetrical with no gross deformities  Skin: No lesions on visible extremities Pulses:  Normal  pulses noted Extremities: No clubbing, cyanosis,  or deformities noted. There is 3+ pedal edema Neurological: Alert oriented x 4, grossly nonfocal Cervical Nodes:  No significant cervical adenopathy Inguinal Nodes: No significant inguinal adenopathy Psychological:  Alert and cooperative. Normal mood and affect

## 2012-06-22 NOTE — Patient Instructions (Addendum)
You have been scheduled for your abdominal ultrasound on 06/26/2012 at 8am at [facility] Nothing to eat or drink after midnight Your procedure has been scheduled at [facility] de[facility] Separate instructions have been given We are giving you a Hep A/B injection today You will need to come back in 7 days for your second injection You will also need to come back in 21-30 days for your 3rd injection And again in 1 year for your last injection You will begin Lasix 40mg  once a day We have sent in a prescription to your pharmacy You will need to labs drawn before your next appointment in 1 week , just go to the basement before your next appointment We gave you your flu vaccine today

## 2012-06-22 NOTE — Assessment & Plan Note (Signed)
Grade 1 varices at 2011 endoscopy  Recommendations #1 repeat endoscopy #2 continue nadolol

## 2012-06-22 NOTE — Assessment & Plan Note (Addendum)
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   She appears to have recent decompensation as evidenced by worsening jaundice and increasing ascites.  Recommendations #1 check alpha-fetoprotein #2 abdominal ultrasound #3 restart Aldactone 100 mg twice a day; lower Lasix to 40 mg daily #4 vaccinations for hepatitis A, B. and Pneumovax

## 2012-06-23 ENCOUNTER — Telehealth: Payer: Self-pay | Admitting: *Deleted

## 2012-06-23 NOTE — Telephone Encounter (Signed)
Left VM at home/cell #'s asking if GI addressed her low K+ level on 06/22/12? Requested she call back.

## 2012-06-23 NOTE — Telephone Encounter (Signed)
Message copied by Wandalee Ferdinand on Fri Jun 23, 2012 10:53 AM ------      Message from: Thornton Papas B      Created: Thu Jun 22, 2012  9:26 PM       Please call patient, ? Was the low K+ addressed with GI 11/14

## 2012-06-26 ENCOUNTER — Ambulatory Visit (HOSPITAL_COMMUNITY): Admission: RE | Admit: 2012-06-26 | Payer: Medicare Other | Source: Ambulatory Visit

## 2012-06-27 ENCOUNTER — Other Ambulatory Visit: Payer: Self-pay | Admitting: Gastroenterology

## 2012-06-27 ENCOUNTER — Other Ambulatory Visit (HOSPITAL_COMMUNITY): Payer: Medicare Other

## 2012-06-27 NOTE — Telephone Encounter (Signed)
She should increase Lasix to 40 mg twice a day. Call back in 3 days

## 2012-06-27 NOTE — Telephone Encounter (Signed)
Called pt and told her to take the Lasix twice a day and to call back in in 3 days with progress,

## 2012-06-27 NOTE — Telephone Encounter (Signed)
Dr Arlyce Dice, I just sent in a script for this pt for Lasix to take 1 up to 4 times a day. She said that the the spirilactone is not working but the lasix is.

## 2012-06-28 ENCOUNTER — Telehealth: Payer: Self-pay | Admitting: *Deleted

## 2012-06-28 NOTE — Telephone Encounter (Signed)
Pt is to come for follow-up OV with Dr. Arlyce Dice 06/30/12. Lab orders are in epic, pt does not have a lab appt because they can come anytime for the labs. Lab does not require an appt. On office note from 06/22/12 it appears there was a dose change to the potassium from to . Left VM for Dr. Kalman Drape nurse.

## 2012-06-28 NOTE — Telephone Encounter (Signed)
Left VM asking if Dr. Arlyce Dice is following up on low K+ result from last week. Patient has not returned call. Noted to have appointment at [facility] on 11/22. Has labs ordered for 11/21, but no lab appointment.

## 2012-06-30 ENCOUNTER — Telehealth: Payer: Self-pay | Admitting: Gastroenterology

## 2012-06-30 ENCOUNTER — Ambulatory Visit (HOSPITAL_COMMUNITY)
Admission: RE | Admit: 2012-06-30 | Discharge: 2012-06-30 | Disposition: A | Discharge status: Home / Self Care | Payer: Medicare Other | Source: Ambulatory Visit | Attending: Gastroenterology | Admitting: Gastroenterology

## 2012-06-30 ENCOUNTER — Encounter: Payer: Self-pay | Admitting: Gastroenterology

## 2012-06-30 ENCOUNTER — Ambulatory Visit (INDEPENDENT_AMBULATORY_CARE_PROVIDER_SITE_OTHER): Payer: Medicare Other | Admitting: Gastroenterology

## 2012-06-30 ENCOUNTER — Other Ambulatory Visit (INDEPENDENT_AMBULATORY_CARE_PROVIDER_SITE_OTHER): Payer: Medicare Other

## 2012-06-30 VITALS — BP 134/70 | HR 111 | Ht 61.0 in | Wt 164.0 lb

## 2012-06-30 DIAGNOSIS — R110 Nausea: Secondary | ICD-10-CM | POA: Insufficient documentation

## 2012-06-30 DIAGNOSIS — K7460 Unspecified cirrhosis of liver: Secondary | ICD-10-CM

## 2012-06-30 DIAGNOSIS — R1011 Right upper quadrant pain: Secondary | ICD-10-CM | POA: Insufficient documentation

## 2012-06-30 DIAGNOSIS — Z23 Encounter for immunization: Secondary | ICD-10-CM

## 2012-06-30 DIAGNOSIS — R161 Splenomegaly, not elsewhere classified: Secondary | ICD-10-CM | POA: Insufficient documentation

## 2012-06-30 LAB — COMPREHENSIVE METABOLIC PANEL
Albumin: 1.6 g/dL — ABNORMAL LOW (ref 3.5–5.2)
CO2: 33 mEq/L — ABNORMAL HIGH (ref 19–32)
Calcium: 8.1 mg/dL — ABNORMAL LOW (ref 8.4–10.5)
Chloride: 94 mEq/L — ABNORMAL LOW (ref 96–112)
GFR: 126.24 mL/min (ref >60.00)
Glucose, Bld: 115 mg/dL — ABNORMAL HIGH (ref 70–99)
Potassium: 2.6 mEq/L — CL (ref 3.5–5.1)
Sodium: 134 mEq/L — ABNORMAL LOW (ref 135–145)
Total Bilirubin: 14.7 mg/dL — ABNORMAL HIGH (ref 0.3–1.2)
Total Protein: 6.2 g/dL (ref 6.0–8.3)

## 2012-06-30 MED ORDER — ALPRAZOLAM 0.5 MG PO TABS: ORAL_TABLET | ORAL | Status: DC

## 2012-06-30 MED ORDER — TRAMADOL HCL 50 MG PO TABS: 50.0000 mg | ORAL_TABLET | Freq: Four times a day (QID) | ORAL | Status: DC | PRN

## 2012-06-30 MED ORDER — POTASSIUM CHLORIDE CRYS ER 20 MEQ PO TBCR: 20.0000 meq | EXTENDED_RELEASE_TABLET | Freq: Every day | ORAL | Status: DC

## 2012-06-30 NOTE — Telephone Encounter (Signed)
Was called about "critical lab" K 2.6.  I spoke with Clydia, she has not been taking her potassium supplements because she ran out.  A new script was called in today.  She is going to take 2 of the 20 meq pills today and then one pill daily after that.

## 2012-06-30 NOTE — Progress Notes (Signed)
History of Present Illness: [name] has returned for followup of cirrhosis. On a regimen of Aldactone 100 mg twice a day and 40 mg of Lasix twice a day she has slowly lost weight, specifically 5 pounds since her last visit. She's complaining of crampy pain in her back and her right lower quadrant and legs. She's having difficulty sleeping.  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  She complains of fatigue.  She is sleepy during the day but is unable to sleep at night.    Past Medical History  Diagnosis Date  . Substance abuse   . Autoimmune hepatitis   . Esophageal varices   . Cirrhosis of liver    History reviewed. No pertinent past surgical history. family history is negative for Cancer. Current Outpatient Prescriptions  Medication Sig Dispense Refill  . azaTHIOprine (IMURAN) 50 MG tablet Take 50 mg by mouth daily. Per [facility]      . EPINEPHrine (EPI-PEN) 0.3 mg/0.3 mL DEVI Inject 0.3 mLs (0.3 mg total) into the muscle once.  1 Device  0  . furosemide (LASIX) 40 MG tablet Take 1 tablet (40 mg total) by mouth daily.  30 tablet  2  . HYDROcodone-acetaminophen (VICODIN) 5-500 MG per tablet Take 1 tablet by mouth every 6 (six) hours as needed. Prescribed by PCP      . Lactulose 20 GM/30ML SOLN Take 15 mLs by mouth daily.       . levETIRAcetam (KEPPRA) 750 MG tablet Take 750 mg by mouth 2 (two) times daily.        . nadolol (CORGARD) 20 MG tablet Take 20 mg by mouth daily.        . omeprazole (PRILOSEC) 20 MG capsule Take 1 capsule (20 mg total) by mouth daily.  30 capsule  prn  . potassium chloride SA (K-DUR,KLOR-CON) 20 MEQ tablet Take 20 mEq by mouth daily.      . spironolactone (ALDACTONE) 100 MG tablet Take 1 tablet (100 mg total) by mouth 2 (two) times daily.  60 tablet  2   Current Facility-Administered Medications  Medication Dose Route Frequency  Provider Last Rate Last Dose  . TDaP (BOOSTRIX) injection 0.5 mL  0.5 mL Intramuscular Once Jill N Konkol, MD      . tuberculin injection 5 Units  5 Units Intradermal Once Jill N Konkol, MD       Allergies as of 06/30/2012 - Review Complete 06/30/2012  Allergen Reaction Noted  . Ibuprofen Shortness Of Breath     reports that she quit smoking about 6 years ago. Her smoking use included Cigarettes. She has never used smokeless tobacco. She reports that she drinks alcohol. She reports that she does not use illicit drugs.     Review of Systems: Pertinent positive and negative review of systems were noted in the above HPI section. All other review of systems were otherwise negative.  Vital signs were reviewed in today's medical record Physical Exam: General: Well developed , well nourished, no acute distress She has scleral icterus Abdomen is protuberant with obvious ascites. There is trace pedal edema She is alert and oriented x3. There is no asterixis       

## 2012-06-30 NOTE — Patient Instructions (Addendum)
You will follow up in 3 weeks We are giving you a xanax prescription Continue Lasix twice a day Go down to the basement to have your labs drawn

## 2012-06-30 NOTE — Assessment & Plan Note (Addendum)
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   She appears to have recent decompensation as evidenced by worsening jaundice and increasing ascites. She has progressive cirrhotic changes as determined by ultrasound with worsening portal hypertension and collaterals. She has diuresis of 5 pounds on a regimen of Aldactone twice a day and Lasix 40 mg twice a day.  Recommendations #1 check  bmet. Provider that her renal function is stable she'll continue her current medications to attempt to further diuresis #2 repeat endoscopy with surveillance of varices #3 Xanax 0.5-1 mg each bedtime as needed because of problems sleeping.  I carefully instructed her to contact me if she develops any confusion since he is at high-risk for encephalopathy #4 Ultram for pain. Tonic water for cramps

## 2012-07-04 ENCOUNTER — Ambulatory Visit: Payer: Medicare Other | Admitting: Gastroenterology

## 2012-07-04 ENCOUNTER — Other Ambulatory Visit: Payer: Medicare Other | Admitting: Lab

## 2012-07-10 ENCOUNTER — Telehealth: Payer: Self-pay

## 2012-07-10 NOTE — Telephone Encounter (Signed)
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      ----- Message -----         From: Wandalee Ferdinand, RN         Sent: 07/05/2012   3:01 PM           To: Louis Meckel, MD      Subject: Labs                                                     When is next Bmet with your office. Patient cancelled her lab here at [facility] yesterday saying she labs per Dr. Arlyce Dice on 11/25.      ? Results.      Thanks,      Kem Boroughs, RN

## 2012-07-10 NOTE — Telephone Encounter (Signed)
Called pt and asked her to come for labs today. Pt sounded very sleepy on the phone and states that she will try to come today, if not she will come in the am. Discussed with pt that she really needs to come today. Pt states she will try.

## 2012-07-11 ENCOUNTER — Other Ambulatory Visit (INDEPENDENT_AMBULATORY_CARE_PROVIDER_SITE_OTHER): Payer: Medicare Other

## 2012-07-11 ENCOUNTER — Other Ambulatory Visit: Payer: Self-pay | Admitting: Gastroenterology

## 2012-07-11 DIAGNOSIS — E876 Hypokalemia: Secondary | ICD-10-CM

## 2012-07-11 LAB — BASIC METABOLIC PANEL
Calcium: 8.2 mg/dL — ABNORMAL LOW (ref 8.4–10.5)
GFR: 137.14 mL/min (ref >60.00)
Sodium: 132 mEq/L — ABNORMAL LOW (ref 135–145)

## 2012-07-18 ENCOUNTER — Ambulatory Visit: Payer: Medicare Other | Admitting: Nurse Practitioner

## 2012-07-18 ENCOUNTER — Encounter (HOSPITAL_COMMUNITY): Payer: Self-pay | Admitting: *Deleted

## 2012-07-18 ENCOUNTER — Other Ambulatory Visit: Payer: Self-pay | Admitting: Gastroenterology

## 2012-07-18 ENCOUNTER — Other Ambulatory Visit: Payer: Medicare Other | Admitting: Lab

## 2012-07-18 ENCOUNTER — Ambulatory Visit (HOSPITAL_COMMUNITY)
Admission: RE | Admit: 2012-07-18 | Discharge: 2012-07-18 | Disposition: A | Discharge status: Home / Self Care | Payer: Medicare Other | Source: Ambulatory Visit | Attending: Gastroenterology | Admitting: Gastroenterology

## 2012-07-18 ENCOUNTER — Encounter (HOSPITAL_COMMUNITY)
Admission: RE | Disposition: A | Discharge status: Home / Self Care | Payer: Self-pay | Source: Ambulatory Visit | Attending: Gastroenterology

## 2012-07-18 DIAGNOSIS — I8510 Secondary esophageal varices without bleeding: Secondary | ICD-10-CM | POA: Insufficient documentation

## 2012-07-18 DIAGNOSIS — I8500 Esophageal varices without bleeding: Secondary | ICD-10-CM

## 2012-07-18 DIAGNOSIS — R1031 Right lower quadrant pain: Secondary | ICD-10-CM | POA: Insufficient documentation

## 2012-07-18 DIAGNOSIS — M79609 Pain in unspecified limb: Secondary | ICD-10-CM | POA: Insufficient documentation

## 2012-07-18 DIAGNOSIS — K7460 Unspecified cirrhosis of liver: Secondary | ICD-10-CM | POA: Insufficient documentation

## 2012-07-18 DIAGNOSIS — Z79899 Other long term (current) drug therapy: Secondary | ICD-10-CM | POA: Insufficient documentation

## 2012-07-18 DIAGNOSIS — M549 Dorsalgia, unspecified: Secondary | ICD-10-CM | POA: Insufficient documentation

## 2012-07-18 DIAGNOSIS — K754 Autoimmune hepatitis: Secondary | ICD-10-CM | POA: Insufficient documentation

## 2012-07-18 HISTORY — PX: GASTRIC VARICES BANDING: SHX5519

## 2012-07-18 HISTORY — PX: ESOPHAGOGASTRODUODENOSCOPY: SHX5428

## 2012-07-18 LAB — BASIC METABOLIC PANEL
Chloride: 94 mEq/L — ABNORMAL LOW (ref 96–112)
GFR calc Af Amer: >90 mL/min (ref >90)
GFR calc non Af Amer: >90 mL/min (ref >90)
Potassium: 3.8 mEq/L (ref 3.5–5.1)
Sodium: 130 mEq/L — ABNORMAL LOW (ref 135–145)

## 2012-07-18 SURGERY — EGD (ESOPHAGOGASTRODUODENOSCOPY)
Anesthesia: Moderate Sedation

## 2012-07-18 MED ORDER — SODIUM CHLORIDE 0.9 % IV SOLN
INTRAVENOUS | Status: DC
  Administered 2012-07-18: 09:00:00 via INTRAVENOUS

## 2012-07-18 MED ORDER — ONDANSETRON HCL 4 MG PO TABS: 4.0000 mg | ORAL_TABLET | Freq: Three times a day (TID) | ORAL | Status: DC | PRN

## 2012-07-18 MED ORDER — BUTAMBEN-TETRACAINE-BENZOCAINE 2-2-14 % EX AERO
INHALATION_SPRAY | CUTANEOUS | Status: DC | PRN
  Administered 2012-07-18: 2 via TOPICAL

## 2012-07-18 MED ORDER — FENTANYL CITRATE 0.05 MG/ML IJ SOLN
INTRAMUSCULAR | Status: AC
  Filled 2012-07-18: qty 4

## 2012-07-18 MED ORDER — ONDANSETRON HCL 4 MG/2ML IJ SOLN
4.0000 mg | Freq: Once | INTRAMUSCULAR | Status: AC
  Administered 2012-07-18: 4 mg via INTRAVENOUS

## 2012-07-18 MED ORDER — MIDAZOLAM HCL 10 MG/2ML IJ SOLN
INTRAMUSCULAR | Status: DC | PRN
  Administered 2012-07-18: 1 mg via INTRAVENOUS
  Administered 2012-07-18 (×2): 2 mg via INTRAVENOUS
  Administered 2012-07-18: 1 mg via INTRAVENOUS
  Administered 2012-07-18: 2 mg via INTRAVENOUS

## 2012-07-18 MED ORDER — MIDAZOLAM HCL 10 MG/2ML IJ SOLN
INTRAMUSCULAR | Status: AC
  Filled 2012-07-18: qty 4

## 2012-07-18 MED ORDER — DIPHENHYDRAMINE HCL 50 MG/ML IJ SOLN
INTRAMUSCULAR | Status: AC
  Filled 2012-07-18: qty 1

## 2012-07-18 MED ORDER — DIPHENHYDRAMINE HCL 50 MG/ML IJ SOLN
INTRAMUSCULAR | Status: DC | PRN
  Administered 2012-07-18 (×2): 12.5 mg via INTRAVENOUS

## 2012-07-18 MED ORDER — FENTANYL CITRATE 0.05 MG/ML IJ SOLN
INTRAMUSCULAR | Status: DC | PRN
  Administered 2012-07-18 (×4): 25 ug via INTRAVENOUS

## 2012-07-18 MED ORDER — ONDANSETRON HCL 4 MG/2ML IJ SOLN
INTRAMUSCULAR | Status: AC
  Filled 2012-07-18: qty 2

## 2012-07-18 NOTE — H&P (View-Only) (Signed)
History of Present Illness: [name] has returned for followup of cirrhosis. On a regimen of Aldactone 100 mg twice a day and 40 mg of Lasix twice a day she has slowly lost weight, specifically 5 pounds since her last visit. She's complaining of crampy pain in her back and her right lower quadrant and legs. She's having difficulty sleeping.  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  She complains of fatigue.  She is sleepy during the day but is unable to sleep at night.    Past Medical History  Diagnosis Date  . Substance abuse   . Autoimmune hepatitis   . Esophageal varices   . Cirrhosis of liver    History reviewed. No pertinent past surgical history. family history is negative for Cancer. Current Outpatient Prescriptions  Medication Sig Dispense Refill  . azaTHIOprine (IMURAN) 50 MG tablet Take 50 mg by mouth daily. Per [facility]      . EPINEPHrine (EPI-PEN) 0.3 mg/0.3 mL DEVI Inject 0.3 mLs (0.3 mg total) into the muscle once.  1 Device  0  . furosemide (LASIX) 40 MG tablet Take 1 tablet (40 mg total) by mouth daily.  30 tablet  2  . HYDROcodone-acetaminophen (VICODIN) 5-500 MG per tablet Take 1 tablet by mouth every 6 (six) hours as needed. Prescribed by PCP      . Lactulose 20 GM/30ML SOLN Take 15 mLs by mouth daily.       [name] levETIRAcetam (KEPPRA) 750 MG tablet Take 750 mg by mouth 2 (two) times daily.        . nadolol (CORGARD) 20 MG tablet Take 20 mg by mouth daily.        [name] omeprazole (PRILOSEC) 20 MG capsule Take 1 capsule (20 mg total) by mouth daily.  30 capsule  prn  . potassium chloride SA (K-DUR,KLOR-CON) 20 MEQ tablet Take 20 mEq by mouth daily.      [name] spironolactone (ALDACTONE) 100 MG tablet Take 1 tablet (100 mg total) by mouth 2 (two) times daily.  60 tablet  2   Current Facility-Administered Medications  Medication Dose Route Frequency  Provider Last Rate Last Dose  . TDaP (BOOSTRIX) injection 0.5 mL  0.5 mL Intramuscular Once Durwin Reges, MD      . tuberculin injection 5 Units  5 Units Intradermal Once Durwin Reges, MD       Allergies as of 06/30/2012 - Review Complete 06/30/2012  Allergen Reaction Noted  . Ibuprofen Shortness Of Breath     reports that she quit smoking about 6 years ago. Her smoking use included Cigarettes. She has never used smokeless tobacco. She reports that she drinks alcohol. She reports that she does not use illicit drugs.     Review of Systems: Pertinent positive and negative review of systems were noted in the above HPI section. All other review of systems were otherwise negative.  Vital signs were reviewed in today's medical record Physical Exam: General: Well developed , well nourished, no acute distress She has scleral icterus Abdomen is protuberant with obvious ascites. There is trace pedal edema She is alert and oriented x3. There is no asterixis

## 2012-07-18 NOTE — Interval H&P Note (Signed)
History and Physical Interval Note:  07/18/2012 8:34 AM  [name]  has presented today for surgery, with the diagnosis of 571.5  The various methods of treatment have been discussed with the patient and family. After consideration of risks, benefits and other options for treatment, the patient has consented to  Procedure(s) (LRB) with comments: ESOPHAGOGASTRODUODENOSCOPY (EGD) (N/A) GASTRIC VARICES BANDING (N/A) as a surgical intervention .  The patient's history has been reviewed, patient examined, no change in status, stable for surgery.  I have reviewed the patient's chart and labs.  Questions were answered to the patient's satisfaction.    The recent H&P (dated *06/29/12**) was reviewed, the patient was examined and there is no change in the patients condition since that H&P was completed.   Melvia Heaps  07/18/2012, 8:34 AM    Melvia Heaps

## 2012-07-18 NOTE — Op Note (Signed)
[facility] [address] [place], [address]   ENDOSCOPY PROCEDURE REPORT  PATIENT: Kenlei, Safi  MR#: [id number] BIRTHDATE: 1986/03/06 , 26  yrs. old GENDER: Female ENDOSCOPIST: Louis Meckel, MD REFERRED BY:  Doralee Albino, M.D. PROCEDURE DATE:  07/18/2012 PROCEDURE:  EGD, diagnostic ASA CLASS:     Class III INDICATIONS:  Surveillance. MEDICATIONS: These medications were titrated to patient response per physician's verbal order, Versed 8 mg IV, Fentanyl 100 mcg IV, and Benadryl 25 mg IV TOPICAL ANESTHETIC: Cetacaine Spray  DESCRIPTION OF PROCEDURE: After the risks benefits and alternatives of the procedure were thoroughly explained, informed consent was obtained.  The Pentax Gastroscope [id number] endoscope was introduced through the mouth and advanced to the third portion of the duodenum. Without limitations.  The instrument was slowly withdrawn as the mucosa was fully examined.      Grade 1 varices were seen in the very distal esophagus. There was retained gastric contents.   The remainder of the upper endoscopy exam was otherwise normal.  Retroflexed views revealed no abnormalities.     The scope was then withdrawn from the patient and the procedure completed.  COMPLICATIONS: There were no complications. ENDOSCOPIC IMPRESSION: 1.   grade 1 varices distal esophagus  RECOMMENDATIONS: 1.  continue nadolol 2.  followup endoscopy one year REPEAT EXAM:  eSigned:  Louis Meckel, MD 07/18/2012 9:03 AM   CC:

## 2012-07-18 NOTE — OR Nursing (Signed)
Pt c/o severe nausea in admitting for endoscopy procedure.  Paged Dr. Arlyce Dice, orders given to Darl Pikes, RN for Zofran IV. Administered to pt.  Will continue to monitor.  Angelique Blonder, RN

## 2012-07-19 ENCOUNTER — Encounter (HOSPITAL_COMMUNITY): Payer: Self-pay | Admitting: Gastroenterology

## 2012-07-19 ENCOUNTER — Telehealth: Payer: Self-pay

## 2012-07-19 ENCOUNTER — Telehealth: Payer: Self-pay | Admitting: Gastroenterology

## 2012-07-19 DIAGNOSIS — K7460 Unspecified cirrhosis of liver: Secondary | ICD-10-CM

## 2012-07-19 MED ORDER — PANTOPRAZOLE SODIUM 40 MG PO TBEC: 40.0000 mg | DELAYED_RELEASE_TABLET | Freq: Every day | ORAL | Status: DC

## 2012-07-19 MED ORDER — ALPRAZOLAM 0.5 MG PO TABS: ORAL_TABLET | ORAL | Status: DC

## 2012-07-19 NOTE — Telephone Encounter (Signed)
Switch to protonix 40mg  bid. Begin tonic water

## 2012-07-19 NOTE — Telephone Encounter (Signed)
Corrected prescription called to pharmacy for the protonix.

## 2012-07-19 NOTE — Telephone Encounter (Signed)
Pt called and states she is having bad reflux since the EGD. Pt has prilosec 20mg . Wants to know if she can take something else to help. Pt also states taking the KCL even though she is down to 1/day her hands are "locking up." Pt states that is getting worse. Pt also requests something to help her sleep. Please advise.

## 2012-07-19 NOTE — Telephone Encounter (Signed)
See previous phone note.  

## 2012-07-19 NOTE — Addendum Note (Signed)
Addended by: Selinda Michaels R on: 07/19/2012 01:45 PM   Modules accepted: Orders

## 2012-07-19 NOTE — Telephone Encounter (Signed)
Advised the patient to  Pharmacist about over-the-counter medications for muscle cramping. Begin Xanax 0.5-1 mg each bedtime as needed for sleep

## 2012-07-19 NOTE — Telephone Encounter (Signed)
Pt aware and rx sent to pharmacy. 

## 2012-07-20 ENCOUNTER — Other Ambulatory Visit: Payer: Medicare Other

## 2012-07-20 ENCOUNTER — Ambulatory Visit: Payer: Medicare Other | Admitting: Nurse Practitioner

## 2012-07-21 ENCOUNTER — Ambulatory Visit (INDEPENDENT_AMBULATORY_CARE_PROVIDER_SITE_OTHER): Payer: Medicare Other | Admitting: Family Medicine

## 2012-07-21 ENCOUNTER — Encounter: Payer: Self-pay | Admitting: Family Medicine

## 2012-07-21 VITALS — BP 125/76 | HR 97 | Ht 61.0 in | Wt 156.5 lb

## 2012-07-21 DIAGNOSIS — K7460 Unspecified cirrhosis of liver: Secondary | ICD-10-CM

## 2012-07-21 DIAGNOSIS — R1031 Right lower quadrant pain: Secondary | ICD-10-CM

## 2012-07-21 DIAGNOSIS — R1013 Epigastric pain: Secondary | ICD-10-CM

## 2012-07-21 DIAGNOSIS — N912 Amenorrhea, unspecified: Secondary | ICD-10-CM

## 2012-07-21 DIAGNOSIS — R109 Unspecified abdominal pain: Secondary | ICD-10-CM

## 2012-07-21 DIAGNOSIS — R1032 Left lower quadrant pain: Secondary | ICD-10-CM

## 2012-07-21 LAB — CBC
Hemoglobin: 11 g/dL — ABNORMAL LOW (ref 12.0–15.0)
MCH: 37.5 pg — ABNORMAL HIGH (ref 26.0–34.0)
MCHC: 35.4 g/dL (ref 30.0–36.0)
MCV: 106.1 fL — ABNORMAL HIGH (ref 78.0–100.0)
RBC: 2.93 MIL/uL — ABNORMAL LOW (ref 3.87–5.11)

## 2012-07-21 LAB — POCT URINALYSIS DIPSTICK
Glucose, UA: 100
Nitrite, UA: NEGATIVE
Urobilinogen, UA: >=8

## 2012-07-21 LAB — COMPREHENSIVE METABOLIC PANEL
ALT: 55 U/L — ABNORMAL HIGH (ref 0–35)
AST: 121 U/L — ABNORMAL HIGH (ref 0–37)
Alkaline Phosphatase: 628 U/L — ABNORMAL HIGH (ref 39–117)
Calcium: 8.1 mg/dL — ABNORMAL LOW (ref 8.4–10.5)
Chloride: 96 mEq/L (ref 96–112)
Creat: 0.83 mg/dL (ref 0.50–1.10)
Potassium: 3.9 mEq/L (ref 3.5–5.3)

## 2012-07-21 LAB — POCT URINE PREGNANCY: Preg Test, Ur: NEGATIVE

## 2012-07-21 MED ORDER — METOCLOPRAMIDE HCL 10 MG PO TABS: 10.0000 mg | ORAL_TABLET | Freq: Four times a day (QID) | ORAL | Status: DC

## 2012-07-21 NOTE — Patient Instructions (Signed)
We are going to do some blood tests today and will call you with the results.    Take the Metoclopramide (Reglan) about 30 minutes before each meal and before bed (4 times a day).  This will help keep the food down.  It should also help with the pain.    If the pain is continuing through next week, you should schedule to see your GI doctor again.    I hope you have a good Christmas.  It was good to meet you

## 2012-07-21 NOTE — Progress Notes (Signed)
Patient ID: [name], female   DOB: 1986/02/07, 26 y.o.   MRN: [id number] [name] is a 26 y.o. female who presents to [facility] today for abdominal pain of several days' duration:   1.  Abdominal pain:  Patient describes 2 separate locations of abdominal pain:  BL lower quadrants as well as epigastric pain with concomitant reflux.  Lower abdominal pain has been present for several weeks.  Describes as cramping pain.  Has not had menstrual period for "years."  No discharge, bleeding, or dysuria.  Has had ongoing trouble with nausea and vomiting, as below.  Has regular soft bowel movements as she takes daily Lactulose.  Is on high dose Furosemide and Spironolactone and therefore has polyuria for weeks.  No fevers or chills.   Epigastric pain has been present for days but worse since having EGD 2 days prior.  Has had trouble with reflux in past, also worse after EGD, GI increased her from daily to BID Protonix which she does not feel has helped.  Describes pain as "burning" in nature.  Vomits any solid food ingestion.  Is able to hold down PO gatorade, pedialyte, and ginger ale.  Taking Zofran without relief.    Of note, patient stopped drinking totally before Thanksgiving.  Doing well with this.  On Xanax for sleep but otherwise does not have any mental status changes, anxiety, tremulousness, or seizures.    The following portions of the patient's history were reviewed and updated as appropriate: allergies, current medications, past medical history, family and social history, and problem list.  Patient is a nonsmoker.  Past Medical History  Diagnosis Date  . Substance abuse   . Esophageal varices   . Cirrhosis of liver   . Autoimmune hepatitis     ROS as above otherwise neg. No Chest pain, palpitations, SOB, Fever, Chills, Abd pain, N/V/D.  Medications reviewed. Current Outpatient Prescriptions  Medication Sig Dispense Refill  . ALPRAZolam (XANAX) 0.5 MG tablet Take one tablet at bedtime as  needed  25 tablet  1  . ALPRAZolam (XANAX) 0.5 MG tablet Take 1-2 tablets at bedtime as needed for sleep  45 tablet  0  . azaTHIOprine (IMURAN) 50 MG tablet Take 50 mg by mouth daily. Per [facility]      . EPINEPHrine (EPI-PEN) 0.3 mg/0.3 mL DEVI Inject 0.3 mLs (0.3 mg total) into the muscle once.  1 Device  0  . furosemide (LASIX) 40 MG tablet Take 1 tablet (40 mg total) by mouth daily.  30 tablet  2  . HYDROcodone-acetaminophen (VICODIN) 5-500 MG per tablet Take 1 tablet by mouth every 6 (six) hours as needed. Prescribed by PCP      . Lactulose 20 GM/30ML SOLN Take 15 mLs by mouth daily.       [name] levETIRAcetam (KEPPRA) 750 MG tablet Take 750 mg by mouth 2 (two) times daily.        . metoCLOPramide (REGLAN) 10 MG tablet Take 1 tablet (10 mg total) by mouth 4 (four) times daily.  90 tablet  0  . nadolol (CORGARD) 20 MG tablet Take 20 mg by mouth daily.        [name] omeprazole (PRILOSEC) 20 MG capsule Take 1 capsule (20 mg total) by mouth daily.  30 capsule  prn  . ondansetron (ZOFRAN) 4 MG tablet Take 1 tablet (4 mg total) by mouth every 8 (eight) hours as needed for nausea.  30 tablet  1  . pantoprazole (PROTONIX) 40 MG tablet Take 1  tablet (40 mg total) by mouth daily.  30 tablet  3  . potassium chloride SA (K-DUR,KLOR-CON) 20 MEQ tablet Take 1 tablet (20 mEq total) by mouth daily.  30 tablet  5  . spironolactone (ALDACTONE) 100 MG tablet Take 1 tablet (100 mg total) by mouth 2 (two) times daily.  60 tablet  2  . traMADol (ULTRAM) 50 MG tablet Take 1 tablet (50 mg total) by mouth every 6 (six) hours as needed for pain.  30 tablet  0   Current Facility-Administered Medications  Medication Dose Route Frequency Provider Last Rate Last Dose  . TDaP (BOOSTRIX) injection 0.5 mL  0.5 mL Intramuscular Once Durwin Reges, MD      . tuberculin injection 5 Units  5 Units Intradermal Once Durwin Reges, MD        Exam:  BP 125/76  Pulse 97  Ht 5\' 1"  (1.549 m)  Wt 156 lb 8 oz (70.988 kg)  BMI 29.57 kg/m2 Gen:  AAF appears stated age, in NAD HEENT: EOMI,  MMM.  Scleral icterus noted BL.  Oral jaundice present Lungs: CTABL Nl WOB Heart: RRR with Grade III/VI SEM noted.  JVD to 7 cm Abdomen:  TTP epigastrum mildly to deep palpation.  Some LUQ tenderness.  No RUQ tenderness.  Murphy's negative.  Also with some mild BL lower quadrant and suprapubic tenderness.   Ext:  No edema noted.    No results found for this or any previous visit (from the past 72 hour(s)).

## 2012-07-23 DIAGNOSIS — R1013 Epigastric pain: Secondary | ICD-10-CM | POA: Insufficient documentation

## 2012-07-23 DIAGNOSIS — R1032 Left lower quadrant pain: Secondary | ICD-10-CM | POA: Insufficient documentation

## 2012-07-23 LAB — URINE CULTURE: Colony Count: 75000

## 2012-07-23 NOTE — Assessment & Plan Note (Signed)
No history of bleeding or signs of melena. Patient has stable vital signs. Much less likely that there is an emergent issues such as injury during endoscopy. We'll provide her with Reglan for her nausea as there was some evidence of food retention during her endoscopy. 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 We'll obtain labs today to compare with prior labs. Adding lipase just to rule out pancreatitis. She should followup with [place] Monday or Tuesday of next week to ensure that her pain is not getting worse. Gave her instructions to the emergency department if her pain does get worse over the weekend. Also stated that if her pain is not improving and that her nausea vomiting are not improving despite Reglan then she should probably present to her gastroenterologist.

## 2012-07-23 NOTE — Assessment & Plan Note (Signed)
UA in clinic showed large blood. Very dark color. We'll send this for culture to rule out any bacterial infection. Her pain is bilateral in nature and therefore much less likely appendicitis present. Obtaining a white count today. Will call patient with results of urine culture

## 2012-07-24 ENCOUNTER — Telehealth: Payer: Self-pay | Admitting: Family Medicine

## 2012-07-24 NOTE — Telephone Encounter (Signed)
Called to relay lab results and check on patient.  Labs basically same, somewhat improved from prior.  Patient doing MUCH better with the Reglan, able to tolerate solid food now and she was able to attend class today.  No fevers or chills, no further vomiting.  All questions answered, patient appreciative of call.

## 2012-07-27 ENCOUNTER — Ambulatory Visit (INDEPENDENT_AMBULATORY_CARE_PROVIDER_SITE_OTHER): Payer: Medicare Other | Admitting: Family Medicine

## 2012-07-27 ENCOUNTER — Observation Stay (HOSPITAL_COMMUNITY)
Admission: EM | Admit: 2012-07-27 | Discharge: 2012-07-30 | Disposition: A | Discharge status: Home / Self Care | Payer: Medicare Other | Attending: Family Medicine | Admitting: Family Medicine

## 2012-07-27 ENCOUNTER — Encounter: Payer: Self-pay | Admitting: Family Medicine

## 2012-07-27 ENCOUNTER — Encounter (HOSPITAL_COMMUNITY): Payer: Self-pay | Admitting: *Deleted

## 2012-07-27 VITALS — BP 160/66 | HR 103 | Temp 99.0°F | Ht 61.0 in | Wt 154.9 lb

## 2012-07-27 DIAGNOSIS — K8309 Other cholangitis: Secondary | ICD-10-CM | POA: Insufficient documentation

## 2012-07-27 DIAGNOSIS — D696 Thrombocytopenia, unspecified: Secondary | ICD-10-CM | POA: Insufficient documentation

## 2012-07-27 DIAGNOSIS — K754 Autoimmune hepatitis: Secondary | ICD-10-CM | POA: Insufficient documentation

## 2012-07-27 DIAGNOSIS — R252 Cramp and spasm: Secondary | ICD-10-CM | POA: Insufficient documentation

## 2012-07-27 DIAGNOSIS — R109 Unspecified abdominal pain: Secondary | ICD-10-CM | POA: Insufficient documentation

## 2012-07-27 DIAGNOSIS — G40909 Epilepsy, unspecified, not intractable, without status epilepticus: Secondary | ICD-10-CM | POA: Insufficient documentation

## 2012-07-27 DIAGNOSIS — E860 Dehydration: Secondary | ICD-10-CM

## 2012-07-27 DIAGNOSIS — R112 Nausea with vomiting, unspecified: Principal | ICD-10-CM | POA: Insufficient documentation

## 2012-07-27 DIAGNOSIS — I8510 Secondary esophageal varices without bleeding: Secondary | ICD-10-CM | POA: Insufficient documentation

## 2012-07-27 DIAGNOSIS — R000 Tachycardia, unspecified: Secondary | ICD-10-CM | POA: Insufficient documentation

## 2012-07-27 DIAGNOSIS — Z79899 Other long term (current) drug therapy: Secondary | ICD-10-CM | POA: Insufficient documentation

## 2012-07-27 LAB — URINALYSIS, ROUTINE W REFLEX MICROSCOPIC
Nitrite: NEGATIVE
Specific Gravity, Urine: 1.024 (ref 1.005–1.030)
Urobilinogen, UA: 2 mg/dL — ABNORMAL HIGH (ref 0.0–1.0)

## 2012-07-27 LAB — CBC WITH DIFFERENTIAL/PLATELET
Basophils Absolute: 0 10*3/uL (ref 0.0–0.1)
Lymphs Abs: 0.5 10*3/uL — ABNORMAL LOW (ref 0.7–4.0)
MCH: 36.9 pg — ABNORMAL HIGH (ref 26.0–34.0)
MCHC: 35.1 g/dL (ref 30.0–36.0)
MCV: 105.2 fL — ABNORMAL HIGH (ref 78.0–100.0)
Monocytes Absolute: 0.3 10*3/uL (ref 0.1–1.0)
Platelets: 97 10*3/uL — ABNORMAL LOW (ref 150–400)
RDW: 13.7 % (ref 11.5–15.5)

## 2012-07-27 LAB — COMPREHENSIVE METABOLIC PANEL
ALT: 68 U/L — ABNORMAL HIGH (ref 0–35)
AST: 160 U/L — ABNORMAL HIGH (ref 0–37)
Calcium: 8.9 mg/dL (ref 8.4–10.5)
GFR calc Af Amer: >90 mL/min (ref >90)
Glucose, Bld: 88 mg/dL (ref 70–99)
Sodium: 128 mEq/L — ABNORMAL LOW (ref 135–145)
Total Protein: 6.4 g/dL (ref 6.0–8.3)

## 2012-07-27 LAB — AMMONIA: Ammonia: 33 umol/L (ref 11–60)

## 2012-07-27 LAB — POCT PREGNANCY, URINE: Preg Test, Ur: NEGATIVE

## 2012-07-27 LAB — URINE MICROSCOPIC-ADD ON

## 2012-07-27 MED ORDER — SODIUM CHLORIDE 0.9 % IV SOLN: 1000.0000 mL | INTRAVENOUS | Status: DC

## 2012-07-27 MED ORDER — MORPHINE SULFATE 4 MG/ML IJ SOLN
2.0000 mg | INTRAMUSCULAR | Status: DC | PRN
  Administered 2012-07-27 – 2012-07-28 (×2): 2 mg via INTRAVENOUS
  Filled 2012-07-27 (×2): qty 1

## 2012-07-27 MED ORDER — HYDROMORPHONE HCL PF 1 MG/ML IJ SOLN
1.0000 mg | Freq: Once | INTRAMUSCULAR | Status: AC
  Administered 2012-07-27: 1 mg via INTRAVENOUS
  Filled 2012-07-27: qty 1

## 2012-07-27 MED ORDER — ONDANSETRON HCL 4 MG/2ML IJ SOLN
4.0000 mg | Freq: Once | INTRAMUSCULAR | Status: AC
  Administered 2012-07-27: 4 mg via INTRAVENOUS

## 2012-07-27 MED ORDER — MAGNESIUM HYDROXIDE 400 MG/5ML PO SUSP: 30.0000 mL | Freq: Two times a day (BID) | ORAL | Status: DC | PRN

## 2012-07-27 MED ORDER — SODIUM CHLORIDE 0.9 % IV SOLN: 1000.0000 mL | Freq: Once | INTRAVENOUS | Status: DC

## 2012-07-27 MED ORDER — ONDANSETRON HCL 4 MG/2ML IJ SOLN
4.0000 mg | Freq: Four times a day (QID) | INTRAMUSCULAR | Status: DC | PRN
  Filled 2012-07-27: qty 2

## 2012-07-27 MED ORDER — ACETAMINOPHEN 325 MG PO TABS: 650.0000 mg | ORAL_TABLET | ORAL | Status: DC | PRN

## 2012-07-27 MED ORDER — SODIUM CHLORIDE 0.9 % IV BOLUS (SEPSIS)
1000.0000 mL | Freq: Once | INTRAVENOUS | Status: AC
  Administered 2012-07-27: 1000 mL via INTRAVENOUS

## 2012-07-27 MED ORDER — ONDANSETRON HCL 4 MG/2ML IJ SOLN
4.0000 mg | Freq: Once | INTRAMUSCULAR | Status: AC
  Administered 2012-07-27: 4 mg via INTRAVENOUS
  Filled 2012-07-27: qty 2

## 2012-07-27 MED ORDER — SODIUM CHLORIDE 0.9 % IV BOLUS (SEPSIS): 1000.0000 mL | Freq: Once | INTRAVENOUS | Status: DC

## 2012-07-27 MED ORDER — MORPHINE SULFATE 2 MG/ML IJ SOLN: 2.0000 mg | INTRAMUSCULAR | Status: DC | PRN

## 2012-07-27 MED ORDER — SODIUM CHLORIDE 0.9 % IV SOLN
INTRAVENOUS | Status: DC
  Administered 2012-07-27: 125 mL/h via INTRAVENOUS

## 2012-07-27 NOTE — ED Notes (Signed)
Patient has liver cirrhosis and is having abd pain and vomiting today.  Sent from MD for further follow up .

## 2012-07-27 NOTE — Progress Notes (Signed)
  Subjective:    Patient ID: [name], female    DOB: 1986/04/17, 26 y.o.   MRN: [id number]  HPI 26 y.o. female with nausea, vomiting since last night. Used phenergan suppository and zofran ODT last night, able to sleep. Has 10/10 epigastric pain since yesterday.  No fever, no diarrhea. Normal BM this morning.  Patient not able to keep anything down by mouth, decreased urine output - voided once this morning. Also c/o increased cramping/locking up in arms and legs. On potassium and "taking more than she should."  Also takes spironolactone and lasix. Has cirrhosis, esophageal varices, ascites.     Review of Systems  Constitutional: Positive for appetite change and fatigue. Negative for fever and chills.  Respiratory: Negative for shortness of breath.   Cardiovascular: Negative for chest pain.  Gastrointestinal: Positive for nausea, vomiting, abdominal pain and abdominal distention. Negative for diarrhea and constipation.  Genitourinary: Positive for decreased urine volume. Negative for dysuria.  Neurological: Positive for weakness.       Objective:   Physical Exam  Constitutional: She is oriented to person, place, and time. She appears distressed.       ill  HENT:  Head: Normocephalic and atraumatic.       Mucous membranes slightly dry.  Eyes: Scleral icterus is present.  Cardiovascular: Normal rate, regular rhythm and normal heart sounds.   No murmur heard. Pulmonary/Chest: Effort normal and breath sounds normal. No respiratory distress. She has no wheezes.  Abdominal: She exhibits distension. There is tenderness. There is no rebound and no guarding.       Diffuse tenderness, especially in epigastric area. Distended. Bowel sounds normal.  Musculoskeletal: Normal range of motion. She exhibits no edema and no tenderness.  Neurological: She is alert and oriented to person, place, and time.       Somnolent  Skin: Skin is warm and dry.   Filed Vitals:   07/27/12 1048  BP: 160/66   Pulse: 103  Temp: 99 F (37.2 C)       Assessment & Plan:  26 y.o. female with increased nausea/vomiting, cramps in extremities. Has cirrhosis with ascites, esophageal varices and chronic nausea/vomiting. Suspect exacerbation vs GI virus on top of chronic issues. Low grade temp. Rule out SBE. Will send to ER for evaluation - labs, fluids. Called ED and talked to charge nurse.  Napoleon Form, MD 07/27/2012 11:21 AM

## 2012-07-27 NOTE — ED Notes (Addendum)
Care transferred and report given to [name], RN.  Patient to go to [id number]

## 2012-07-27 NOTE — ED Notes (Signed)
No changes at the present.  No distress noted. Resp symmetrical and unlabored.  Skin warm and dry.

## 2012-07-27 NOTE — ED Provider Notes (Signed)
History     CSN: [id number]  Arrival date & time 07/27/12  1142   First MD Initiated Contact with Patient 07/27/12 1147      Chief Complaint  Patient presents with  . Abdominal Pain  . Emesis    (Consider location/radiation/quality/duration/timing/severity/associated sxs/prior treatment) HPI Comments: Patient with history of autoimmune hepatitis, sclerosing cholangitis -- presents today with persistent vomiting and upper abdominal pain that started acutely around midnight after eating. Patient is having upper abdominal pain as well. She was seen by her primary care physician this morning who sent her to the emergency department further workup and management. Patient had a bowel movement this morning. Her vomiting is currently bilious. No diarrhea. She denies urinary symptoms. She has locking of her hands for which she's been taking potassium. This is not new today. Patient is followed by Dr. Arlyce Dice and has a doctor at [facility] that she sees occasionally. Patient took Reglan, Xanax and Zofran prior to arrival without relief. Onset acute. Course is constant. Nothing makes symptoms better or worse.  Patient is a 26 y.o. female presenting with abdominal pain and vomiting. The history is provided by the patient and medical records.  Abdominal Pain The primary symptoms of the illness include abdominal pain, nausea and vomiting. The primary symptoms of the illness do not include fever, shortness of breath, diarrhea or dysuria. The current episode started 6 to 12 hours ago. The problem has not changed since onset. Symptoms associated with the illness do not include constipation.  Emesis  Associated symptoms include abdominal pain. Pertinent negatives include no cough, no diarrhea, no fever, no headaches and no myalgias.    Past Medical History  Diagnosis Date  . Substance abuse   . Esophageal varices   . Cirrhosis of liver   . Autoimmune hepatitis     Past Surgical History  Procedure Date  .  Esophagogastroduodenoscopy 07/18/2012    Procedure: ESOPHAGOGASTRODUODENOSCOPY (EGD);  Surgeon: Louis Meckel, MD;  Location: [facility];  Service: Endoscopy;  Laterality: N/A;  . Gastric varices banding 07/18/2012    Procedure: GASTRIC VARICES BANDING;  Surgeon: Louis Meckel, MD;  Location: [facility];  Service: Endoscopy;  Laterality: N/A;    Family History  Problem Relation Age of Onset  . Cancer Neg Hx     History  Substance Use Topics  . Smoking status: Former Smoker    Types: Cigarettes    Quit date: 08/09/2005  . Smokeless tobacco: Never Used  . Alcohol Use: Yes     Comment: drinks occassioanlly    OB History    Grav Para Term Preterm Abortions TAB SAB Ect Mult Living                  Review of Systems  Constitutional: Negative for fever.  HENT: Negative for sore throat and rhinorrhea.   Eyes: Negative for redness.  Respiratory: Negative for cough and shortness of breath.   Cardiovascular: Positive for leg swelling (bilateral). Negative for chest pain.  Gastrointestinal: Positive for nausea, vomiting and abdominal pain. Negative for diarrhea and constipation.  Genitourinary: Negative for dysuria.  Musculoskeletal: Negative for myalgias.  Skin: Negative for rash.  Neurological: Negative for headaches.    Allergies  Ibuprofen  Home Medications   Current Outpatient Rx  Name  Route  Sig  Dispense  Refill  . ALPRAZOLAM 0.5 MG PO TABS      Take one tablet at bedtime as needed   25 tablet   1   .  ALPRAZOLAM 0.5 MG PO TABS      Take 1-2 tablets at bedtime as needed for sleep   45 tablet   0   . AZATHIOPRINE 50 MG PO TABS   Oral   Take 50 mg by mouth daily. Per [facility]         . EPINEPHRINE 0.3 MG/0.3ML IJ DEVI   Intramuscular   Inject 0.3 mLs (0.3 mg total) into the muscle once.   1 Device   0   . FUROSEMIDE 40 MG PO TABS   Oral   Take 1 tablet (40 mg total) by mouth daily.   30 tablet   2   . HYDROCODONE-ACETAMINOPHEN 5-500 MG PO  TABS   Oral   Take 1 tablet by mouth every 6 (six) hours as needed. Prescribed by PCP         . LACTULOSE 20 GM/30ML PO SOLN   Oral   Take 15 mLs by mouth daily.          [name] LEVETIRACETAM 750 MG PO TABS   Oral   Take 750 mg by mouth 2 (two) times daily.           [name] METOCLOPRAMIDE HCL 10 MG PO TABS   Oral   Take 1 tablet (10 mg total) by mouth 4 (four) times daily.   90 tablet   0   . NADOLOL 20 MG PO TABS   Oral   Take 20 mg by mouth daily.           [name] OMEPRAZOLE 20 MG PO CPDR   Oral   Take 1 capsule (20 mg total) by mouth daily.   30 capsule   prn   . ONDANSETRON HCL 4 MG PO TABS   Oral   Take 1 tablet (4 mg total) by mouth every 8 (eight) hours as needed for nausea.   30 tablet   1   . PANTOPRAZOLE SODIUM 40 MG PO TBEC   Oral   Take 1 tablet (40 mg total) by mouth daily.   30 tablet   3   . POTASSIUM CHLORIDE CRYS ER 20 MEQ PO TBCR   Oral   Take 1 tablet (20 mEq total) by mouth daily.   30 tablet   5   . SPIRONOLACTONE 100 MG PO TABS   Oral   Take 1 tablet (100 mg total) by mouth 2 (two) times daily.   60 tablet   2   . TRAMADOL HCL 50 MG PO TABS   Oral   Take 1 tablet (50 mg total) by mouth every 6 (six) hours as needed for pain.   30 tablet   0     BP 146/63  Pulse 85  Temp 98.8 F (37.1 C) (Oral)  Resp 18  SpO2 100%  Physical Exam  Nursing note and vitals reviewed. Constitutional: She appears well-developed and well-nourished.  HENT:  Head: Normocephalic and atraumatic.  Eyes: Conjunctivae normal are normal. Right eye exhibits no discharge. Left eye exhibits no discharge. Scleral icterus is present.  Neck: Normal range of motion. Neck supple.  Cardiovascular: Regular rhythm.  Tachycardia present.   Murmur heard.  Systolic murmur is present with a grade of 2/6  Pulmonary/Chest: Effort normal and breath sounds normal.  Abdominal: Soft. There is tenderness in the right upper quadrant, epigastric area and left upper quadrant. There  is no rigidity, no rebound, no guarding, no CVA tenderness and negative Murphy's sign.  Musculoskeletal: She exhibits edema.  1+ symmetric edema to mid-ankles  Neurological: She is alert.  Skin: Skin is warm and dry.  Psychiatric: She has a normal mood and affect.    ED Course  Procedures (including critical care time)  Labs Reviewed  CBC WITH DIFFERENTIAL - Abnormal; Notable for the following:    RBC 3.06 (*)     Hemoglobin 11.3 (*)     HCT 32.2 (*)     MCV 105.2 (*)     MCH 36.9 (*)     Platelets 97 (*)  PLATELET COUNT CONFIRMED BY SMEAR   Neutrophils Relative 86 (*)     Lymphocytes Relative 8 (*)     Lymphs Abs 0.5 (*)     All other components within normal limits  COMPREHENSIVE METABOLIC PANEL - Abnormal; Notable for the following:    Sodium 128 (*)     Chloride 95 (*)     Albumin 1.7 (*)     AST 160 (*)     ALT 68 (*)     Alkaline Phosphatase 671 (*)     Total Bilirubin 13.1 (*)     All other components within normal limits  URINALYSIS, ROUTINE W REFLEX MICROSCOPIC - Abnormal; Notable for the following:    Color, Urine AMBER (*)  BIOCHEMICALS MAY BE AFFECTED BY COLOR   APPearance HAZY (*)     Hgb urine dipstick SMALL (*)     Bilirubin Urine LARGE (*)     Ketones, ur 15 (*)     Protein, ur 30 (*)     Urobilinogen, UA 2.0 (*)     Leukocytes, UA SMALL (*)     All other components within normal limits  URINE MICROSCOPIC-ADD ON - Abnormal; Notable for the following:    Squamous Epithelial / LPF MANY (*)     Bacteria, UA MANY (*)     All other components within normal limits  LIPASE, BLOOD  AMMONIA  POCT PREGNANCY, URINE  URINE CULTURE   No results found.   No diagnosis found.  12:03 PM Patient seen and examined. Work-up initiated. Medications ordered. PCP notes reviewed.   Vital signs reviewed and are as follows: Filed Vitals:   07/27/12 1148  BP: 146/63  Pulse: 85  Temp: 98.8 F (37.1 C)  Resp: 18   2:12 PM Abd exam unchanged. Mild tachycardia.  Continues to have RUQ pain. No vomiting. Will give water and additional IVF. Labs abnormal but baseline for patient.   4:26 PM Patient continues to be mildly tachycardic. She is feeling better. Hesitant to send patient home until she is well hydrated. Will place in CDU on dehydration protocol.    Handoff to [facility] NP. Plan monitor for several hours. If hydrated and tachycardia resolves -- d/c with supportive tx. If tachycardia persists, or if vomiting -- admit to teaching service.   MDM  Vomiting, dehydration. Will treat in CDU on dehydration protocol. Plan per above.         Renne Crigler, [place] 07/27/12 [phone]

## 2012-07-27 NOTE — ED Notes (Signed)
Pts sclera yellow

## 2012-07-27 NOTE — ED Notes (Signed)
Spoke with house coverage re: bed placement

## 2012-07-27 NOTE — ED Provider Notes (Signed)
History/physical exam/procedure(s) were performed by non-physician practitioner and as supervising physician I was immediately available for consultation/collaboration. I have reviewed all notes and am in agreement with care and plan.   Ivan Lacher S Hazelynn Mckenny, MD 07/27/12 2306 

## 2012-07-27 NOTE — H&P (Signed)
Family Medicine Teaching [facility] Admission History and Physical Service Pager: [phone]  Patient name: [name] Medical record number: [id number] Date of birth: 1985/12/09 Age: 26 y.o. Gender: female  Primary Care Provider: Lloyd Huger, MD  Chief Complaint: Nausea/Vomiting & Tachycardia  Assessment and Plan: Era Parr is a 26 y.o. year old female with history of autoimmune hepatitis, cirrhosis/ascites, and sclerosing cholangitis presenting with persistent nausea and vomiting and tachycardia.   1. Nausea/Vomiting: Likely secondary to GI virus which is causing acute on chronic GI distress.  Patient afebrile without leukocytosis - less likely infectious process.  UA concerning for possible UTI, but patient is asymptomatic and most recent urine culture was negative.  PCP in clinic today concerned about SBP with history of ascites.  Patient complains of abdominal pain, but no fever or AMS.  Lipase 29.  Ammonia 33. 1.  IV Reglan, Zofran, and Phenergan PRN.   2.  MIVF NS @ 125 cc/hr 3.  Will hold off on ascitic fluid for now; will consider if patient clinically worsens 4.  Consider GI consult if symptoms worsen 2. Tachycardia:  HR elevated above baseline.  Likely secondary to pain vs. Dehydration.  No evidence of respiratory distress at this time. 1. MIVF NS @ 125 cc/hr and push clear liquids. 2. Monitor on telemetry.  EKG in AM.   3. Pain control with Morphine. 3. Autoimmune Hepatitis:  Stable. Continue Imuran. 4. Esophageal Varices:  Found on recent EGD per Dr. Arlyce Dice. Continue Protonix and Nadolol. 5. NASH: Continue Lactulose and Lasix per home doses. 6. Seizure Disorder: No seizure activity at this time.  Continue home Keppra 750 mg Daily. 7. Sclerosing Cholangitis: Continue Imuran. 8. Upper and lower extremity cramping: Potassium 4.3.  Hold K-Dur for now. Repeat morning CMET. 9. FEN/GI: Clear Liquid Diet, Advance as Tolerated, NS @ 125 cc/hr. 10. Prophylaxis: Heparin SQ  TID 11. Disposition: Pending clinical improvement 12. Code Status: FULL  History of Present Illness: [name] is a 26 y.o. year old female with history of autoimmune hepatitis, cirrhosis/ascites, and sclerosing cholangitis presenting with persistent nausea and vomiting and tachycardia.  Nausea/vomiting started on 12/10 after endoscopy performed per Dr. Arlyce Dice.  She was seen in our clinic on 12/13 and given Rx for Reglan which seemed to control nausea at that time.  Today, patient says she vomited overnight and could not sleep due to abdominal discomfort.  She came to [facility] again today and was given Rx for Zofran ODT and Phenergan suppository, but nausea/vomiting continued and patient came to ED.  She still complains of nausea and lightheadedness.  Associated symptoms: abdominal pain, decreased appetite, chills.  Patient has been on IV anti-emetics in ED which has improved symptoms.  She last vomited today at noon.  She was able to tolerate sips of apple juice and glazed ham/carrots for dinner without difficulty.  Patient say she still feels dry and dehydrated despite IVF in ED.  Patient denies any fevers at home, diarrhea or constipation.  She denies any pelvic pain, dysuria, burning with urination, frequency. UA results below - urine culture pending.  Furthermore, patient has been persistently tachycardic in ED despite IVF hydration and pain control.  HR has been elevated in the past per previous outpatient clinic notes, but HR in ED range 110-128 which his higher than norm.  Patient denies any CP, SOB, or palpitations.    Patient Active Problem List  Diagnosis  . THROMBOCYTOPENIA  . TOBACCO DEPENDENCE  . INTRACRANIAL HEMORRHAGE  . ESOPHAGEAL VARICES  .  HEPATIC CIRRHOSIS, NONALCOHOLIC  . ENCEPHALOPATHY-HEPATIC  . LIVER FAILURE  . AMENORRHEA  . OTHER SPECIFIED DISEASE OF HAIR&HAIR FOLLICLES  . PAPANICOLAOU SMEAR, ABNORMAL  . PORTAL HYPERTENSION  . Seizure disorder  . Thumb pain  . Edema leg   . Nausea and vomiting in adult  . Hyponatremia  . Right facial swelling  . Autoimmune hepatitis  . Ascites  . Sclerosing cholangitis  . Epigastric pain  . Bilateral lower abdominal pain   Past Medical History: Past Medical History  Diagnosis Date  . Substance abuse   . Esophageal varices   . Cirrhosis of liver   . Autoimmune hepatitis    Past Surgical History: Past Surgical History  Procedure Date  . Esophagogastroduodenoscopy 07/18/2012    Procedure: ESOPHAGOGASTRODUODENOSCOPY (EGD);  Surgeon: Louis Meckel, MD;  Location: [facility];  Service: Endoscopy;  Laterality: N/A;  . Gastric varices banding 07/18/2012    Procedure: GASTRIC VARICES BANDING;  Surgeon: Louis Meckel, MD;  Location: [facility];  Service: Endoscopy;  Laterality: N/A;   Social History: History  Substance Use Topics  . Smoking status: Former Smoker    Types: Cigarettes    Quit date: 08/09/2005  . Smokeless tobacco: Never Used  . Alcohol Use: No   For any additional social history documentation, please refer to relevant sections of EMR.  Family History: Family History  Problem Relation Age of Onset  . Cancer Neg Hx    Allergies: Allergies  Allergen Reactions  . Ibuprofen Shortness Of Breath   Current Facility-Administered Medications on File Prior to Encounter  Medication Dose Route Frequency Provider Last Rate Last Dose  . TDaP (BOOSTRIX) injection 0.5 mL  0.5 mL Intramuscular Once Durwin Reges, MD      . tuberculin injection 5 Units  5 Units Intradermal Once Durwin Reges, MD       Current Outpatient Prescriptions on File Prior to Encounter  Medication Sig Dispense Refill  . azaTHIOprine (IMURAN) 50 MG tablet Take 50 mg by mouth daily.       [name] HYDROcodone-acetaminophen (VICODIN) 5-500 MG per tablet Take 1 tablet by mouth every 6 (six) hours as needed. For pain      . Lactulose 20 GM/30ML SOLN Take 15 mLs by mouth daily as needed. For constipation      . levETIRAcetam (KEPPRA) 750  MG tablet Take 750 mg by mouth daily.       . metoCLOPramide (REGLAN) 10 MG tablet Take 10 mg by mouth 4 (four) times daily.      [name] omeprazole (PRILOSEC) 20 MG capsule Take 20 mg by mouth daily.      . pantoprazole (PROTONIX) 40 MG tablet Take 40 mg by mouth daily.      . potassium chloride SA (K-DUR,KLOR-CON) 20 MEQ tablet Take 20 mEq by mouth 2 (two) times daily.      . promethazine (PHENERGAN) 12.5 MG tablet Take 1 tablet (12.5 mg total) by mouth every 6 (six) hours as needed for nausea.  30 tablet  0  . promethazine (PHENERGAN) 25 MG tablet Take 1 tablet (25 mg total) by mouth every 6 (six) hours as needed for nausea.  12 tablet  0   Review Of Systems: Per HPI  Physical Exam: BP 128/42  Pulse 101  Temp 98.4 F (36.9 C) (Oral)  Resp 13  SpO2 99% Exam: General: pleasant, laying in bed, in no acute distress HEENT: NCAT.  EOMI.  PERRLA.  TM normal bilaterally.  MM dry. Cardiovascular: Tachycardic,  4/6 SEM. Respiratory: CTAB B/L; Effort normal Abdomen: soft, +BS, diffuse tenderness on with mild palpation Extremities: no C/C or pitting edema Skin: warm, no rash, or lesions Neuro: 3/5 MS LE bilaterally, 3/5 MS UE bilaterally, CN 2-12 intact; sensation intact, alert/awake/oriented  Labs and Imaging: CBC BMET   Lab 07/27/12 1200  WBC 5.7  HGB 11.3*  HCT 32.2*  PLT 97*    Lab 07/27/12 1200  NA 128*  K 4.3  CL 95*  CO2 24  BUN 12  CREATININE 0.71  GLUCOSE 88  CALCIUM 8.9     CMET: Alb: 1.7 AST 160 ALT 68 Alk Phos 671 TBili 13.1  EGD 12/10: Grade 1 Esophageal Distal Varices  Urine dipstick shows positive for RBC's, positive for protein, positive for leukocytes, positive for urobilinogen and positive for ketones.  Micro exam: 3-6 WBC's per HPF, 7-10 RBC's per HPF and Many bacteria.  Lipase: 29  Ammonia: 33  DE LA CRUZ,Jaeson Molstad, DO 07/27/2012, 8:54 PM

## 2012-07-27 NOTE — ED Provider Notes (Signed)
Patient moved to CDU for IV fluids under dehydration protocol.  On reassessment, patient remains tachycardic despite several liters of IV fluids.  Pain improved with analgesia.  Family medicine contacted for admission d/t persistent tachycardia.  Jimmye Norman, NP 07/27/12 [phone]

## 2012-07-28 ENCOUNTER — Encounter (HOSPITAL_COMMUNITY): Payer: Self-pay | Admitting: *Deleted

## 2012-07-28 LAB — COMPREHENSIVE METABOLIC PANEL
AST: 202 U/L — ABNORMAL HIGH (ref 0–37)
Albumin: 1.5 g/dL — ABNORMAL LOW (ref 3.5–5.2)
Calcium: 8.3 mg/dL — ABNORMAL LOW (ref 8.4–10.5)
Creatinine, Ser: 0.63 mg/dL (ref 0.50–1.10)
Total Protein: 5.6 g/dL — ABNORMAL LOW (ref 6.0–8.3)

## 2012-07-28 LAB — CBC
Hemoglobin: 10.2 g/dL — ABNORMAL LOW (ref 12.0–15.0)
MCH: 37.8 pg — ABNORMAL HIGH (ref 26.0–34.0)
MCV: 108.5 fL — ABNORMAL HIGH (ref 78.0–100.0)
RBC: 2.7 MIL/uL — ABNORMAL LOW (ref 3.87–5.11)
WBC: 4.9 10*3/uL (ref 4.0–10.5)

## 2012-07-28 LAB — URINE CULTURE
Colony Count: NO GROWTH
Culture: NO GROWTH

## 2012-07-28 MED ORDER — METOCLOPRAMIDE HCL 10 MG PO TABS
10.0000 mg | ORAL_TABLET | Freq: Three times a day (TID) | ORAL | Status: DC
  Administered 2012-07-28 – 2012-07-30 (×11): 10 mg via ORAL
  Filled 2012-07-28 (×15): qty 1

## 2012-07-28 MED ORDER — ALPRAZOLAM 0.5 MG PO TABS
0.5000 mg | ORAL_TABLET | Freq: Every evening | ORAL | Status: DC | PRN
  Administered 2012-07-29: 0.5 mg via ORAL
  Filled 2012-07-28: qty 1

## 2012-07-28 MED ORDER — ONDANSETRON HCL 4 MG/2ML IJ SOLN
4.0000 mg | Freq: Four times a day (QID) | INTRAMUSCULAR | Status: DC | PRN
  Administered 2012-07-28 – 2012-07-29 (×2): 4 mg via INTRAVENOUS
  Filled 2012-07-28 (×3): qty 2

## 2012-07-28 MED ORDER — MORPHINE SULFATE 2 MG/ML IJ SOLN
1.0000 mg | INTRAMUSCULAR | Status: DC | PRN
  Administered 2012-07-28: 2 mg via INTRAVENOUS
  Filled 2012-07-28 (×2): qty 1

## 2012-07-28 MED ORDER — SPIRONOLACTONE 100 MG PO TABS
100.0000 mg | ORAL_TABLET | Freq: Two times a day (BID) | ORAL | Status: DC
  Administered 2012-07-28 – 2012-07-30 (×6): 100 mg via ORAL
  Filled 2012-07-28 (×7): qty 1

## 2012-07-28 MED ORDER — DIPHENHYDRAMINE HCL 25 MG PO CAPS
25.0000 mg | ORAL_CAPSULE | Freq: Four times a day (QID) | ORAL | Status: DC | PRN
  Administered 2012-07-29: 25 mg via ORAL
  Filled 2012-07-28 (×2): qty 1

## 2012-07-28 MED ORDER — PROMETHAZINE HCL 25 MG/ML IJ SOLN
12.5000 mg | Freq: Four times a day (QID) | INTRAMUSCULAR | Status: DC | PRN
  Administered 2012-07-28: 12.5 mg via INTRAVENOUS
  Filled 2012-07-28: qty 1

## 2012-07-28 MED ORDER — WHITE PETROLATUM GEL
Status: AC
  Administered 2012-07-28: 02:00:00
  Filled 2012-07-28: qty 5

## 2012-07-28 MED ORDER — DIPHENHYDRAMINE HCL 25 MG PO CAPS
25.0000 mg | ORAL_CAPSULE | Freq: Once | ORAL | Status: AC
  Administered 2012-07-28: 25 mg via ORAL
  Filled 2012-07-28: qty 1

## 2012-07-28 MED ORDER — DOCUSATE SODIUM 100 MG PO CAPS
100.0000 mg | ORAL_CAPSULE | Freq: Two times a day (BID) | ORAL | Status: DC
  Administered 2012-07-28 – 2012-07-30 (×5): 100 mg via ORAL
  Filled 2012-07-28 (×6): qty 1

## 2012-07-28 MED ORDER — SODIUM CHLORIDE 0.9 % IJ SOLN
3.0000 mL | Freq: Two times a day (BID) | INTRAMUSCULAR | Status: DC
  Administered 2012-07-29: 3 mL via INTRAVENOUS

## 2012-07-28 MED ORDER — METOCLOPRAMIDE HCL 5 MG/ML IJ SOLN
5.0000 mg | Freq: Three times a day (TID) | INTRAMUSCULAR | Status: DC | PRN
  Filled 2012-07-28: qty 1

## 2012-07-28 MED ORDER — EPINEPHRINE HCL 1 MG/ML IJ SOLN: 0.3000 mg | Freq: Once | INTRAMUSCULAR | Status: DC

## 2012-07-28 MED ORDER — FUROSEMIDE 40 MG PO TABS: 40.0000 mg | ORAL_TABLET | Freq: Four times a day (QID) | ORAL | Status: DC | PRN

## 2012-07-28 MED ORDER — HEPARIN SODIUM (PORCINE) 5000 UNIT/ML IJ SOLN
5000.0000 [IU] | Freq: Three times a day (TID) | INTRAMUSCULAR | Status: DC
  Administered 2012-07-28 – 2012-07-30 (×6): 5000 [IU] via SUBCUTANEOUS
  Filled 2012-07-28 (×10): qty 1

## 2012-07-28 MED ORDER — AZATHIOPRINE 50 MG PO TABS
50.0000 mg | ORAL_TABLET | Freq: Once | ORAL | Status: AC
  Administered 2012-07-28: 50 mg via ORAL
  Filled 2012-07-28: qty 1

## 2012-07-28 MED ORDER — LEVETIRACETAM 750 MG PO TABS
750.0000 mg | ORAL_TABLET | Freq: Every day | ORAL | Status: DC
  Administered 2012-07-28 – 2012-07-30 (×3): 750 mg via ORAL
  Filled 2012-07-28 (×3): qty 1

## 2012-07-28 MED ORDER — LACTULOSE 10 GM/15ML PO SOLN
10.0000 g | Freq: Every day | ORAL | Status: DC | PRN
  Administered 2012-07-30: 10 g via ORAL
  Filled 2012-07-28: qty 15

## 2012-07-28 MED ORDER — LEVETIRACETAM 750 MG PO TABS
750.0000 mg | ORAL_TABLET | Freq: Once | ORAL | Status: AC
  Administered 2012-07-28: 750 mg via ORAL
  Filled 2012-07-28: qty 1

## 2012-07-28 MED ORDER — SODIUM CHLORIDE 0.9 % IV SOLN
INTRAVENOUS | Status: DC
  Administered 2012-07-28 – 2012-07-29 (×4): via INTRAVENOUS

## 2012-07-28 MED ORDER — EPINEPHRINE 0.3 MG/0.3ML IJ DEVI: 0.3000 mg | Freq: Once | INTRAMUSCULAR | Status: DC

## 2012-07-28 MED ORDER — TRAMADOL HCL 50 MG PO TABS
50.0000 mg | ORAL_TABLET | Freq: Four times a day (QID) | ORAL | Status: DC
  Administered 2012-07-28 – 2012-07-29 (×5): 50 mg via ORAL
  Filled 2012-07-28 (×9): qty 1

## 2012-07-28 MED ORDER — AZATHIOPRINE 50 MG PO TABS
50.0000 mg | ORAL_TABLET | Freq: Every day | ORAL | Status: DC
  Administered 2012-07-28 – 2012-07-30 (×3): 50 mg via ORAL
  Filled 2012-07-28 (×3): qty 1

## 2012-07-28 NOTE — H&P (Signed)
I examined this patient and discussed the care plan with Dr Tye Savoy and the FPTS team and agree with assessment and plan as documented in the admission note above. Her abdomen is diffusely tender, but soft with increased bowel sounds and no distension, so I agree that we can defer an abdominal tap for analysis of peritoneal fluid. She complains of of brief sharp abdominal pains and is taking iv morphine for that. I encouraged her to try Tramadol orally which she takes at home, since this is less likely to cause ileus and gas pains. She is being advanced to a regular diet at her request and I encouraged her to move around to help pass intestinal gas.

## 2012-07-28 NOTE — Progress Notes (Signed)
FMTS Attending Daily Note:  Renold Don MD  [phone] pager  Family Practice pager:  [phone] I have discussed this patient with the resident Pollie Meyer and attending physician Dr. Sheffield Slider.  I agree with their findings, assessment, and care plan

## 2012-07-28 NOTE — Progress Notes (Addendum)
Chaplain visited patient who stated she was just getting up from a nap and too drowsy for a visit.  Will follow up as needed

## 2012-07-28 NOTE — Progress Notes (Signed)
Family Medicine Teaching Service Daily Progress Note Service Page: [phone]  Patient Assessment: [name] is a 26 y.o. year old female with history of autoimmune hepatitis, cirrhosis/ascites, and sclerosing cholangitis presenting with persistent nausea and vomiting and tachycardia.   Subjective: Patient complained of stomach pain this morning, but she was very sleepy so could not obtain additional history  Objective: Temp:  [97.6 F (36.4 C)-98.5 F (36.9 C)] 97.6 F (36.4 C) (12/20 0946) Pulse Rate:  [83-116] 88  (12/20 0946) Resp:  [13-20] 20  (12/20 0946) BP: (102-131)/(42-75) 109/63 mmHg (12/20 0946) SpO2:  [94 %-100 %] 100 % (12/20 0946) Weight:  [160 lb 0.9 oz (72.6 kg)] 160 lb 0.9 oz (72.6 kg) (12/19 2300) Exam: General: NAD, asleep, awakens to voice Cardiovascular: 3/6 murmur at LUSB, regular rate and rhythm HEENT: scleral icterus present Respiratory: normal respiratory effort Abdomen: soft, nontender to palpation, no ascites appreciated Extremities: nontender to palpation  I have reviewed the patient's medications, labs, imaging, and diagnostic testing.  Notable results are summarized below.  Medications:  Scheduled Meds: Azathioprine 50mg  daily Colace 100mg  BID Heparin 5000 units TID Keppra 750mg  daily Reglan 10mg  TID Spironolactone 100mg  BID Tramadol 50mg  q6h  PRN Meds: Xanax 0.5-1mg  QHS prn Benadryl 25mg  q6h prn itching Lasix 40mg  4 times daily prn Lactulose 10g PO daily prn Zofran 4mg  q6h prn  IVF: NS @ 125cc/hr  Labs:  CBC  Lab 07/28/12 0455 07/27/12 1200  WBC 4.9 5.7  HGB 10.2* 11.3*  HCT 29.3* 32.2*  PLT 76* 97*    BMET  Lab 07/28/12 0455 07/27/12 1200  NA 130* 128*  K 4.0 4.3  CL 101 95*  CO2 21 24  BUN 10 12  CREATININE 0.63 0.71  GLUCOSE 90 88  CALCIUM 8.3* 8.9    Imaging/Diagnostic Tests: None  Assessment/Plan:  [name] is a 26 y.o. year old female with history of autoimmune hepatitis, cirrhosis/ascites, and  sclerosing cholangitis presenting with persistent nausea and vomiting and tachycardia.   1. Nausea/Vomiting: Likely secondary to GI virus which is causing acute on chronic GI distress. Patient afebrile without leukocytosis - less likely infectious process. UA concerning for possible UTI, but patient is asymptomatic and most recent urine culture was negative. PCP in clinic yesterday concerned about SBP with history of ascites. Patient complains of abdominal pain, but no fever. Lipase 29. Ammonia 33.  1. Schedule PO reglan 2. MIVF NS @ 125 cc/hr 3. Will hold off on ascitic fluid for now; will consider if patient clinically worsens 4. Consider GI consult if symptoms worsen  2. Tachycardia: HR elevated above baseline. Likely secondary to pain vs. Dehydration. No evidence of respiratory distress at this time.  1. MIVF NS @ 125 cc/hr and push clear liquids. 2. Monitor on telemetry. EKG in AM.  3. Pain control with home tramadol. Will discuss with team whether this is an appropriate medication, given her hx of seizure disorder. Continue for now. 3. Autoimmune Hepatitis: Stable. Continue Imuran. 4. 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 Arlyce Dice. Continue Protonix and Nadolol. 5. NASH: Continue Lactulose and Lasix per home doses. 6. Seizure Disorder: No seizure activity at this time. Continue home Keppra 750 mg Daily. 7. Sclerosing Cholangitis: Continue Imuran. 8. Upper and lower extremity cramping: Potassium 4.3 upon admission. Hold K-Dur for now. Potassium 4.0 this morning 9. FEN/GI: Clear Liquid Diet, Advance as Tolerated, NS @ 125 cc/hr. 10. Prophylaxis: Heparin SQ TID 11. Disposition: Pending clinical improvement, possible d/c this afternoon if tolerates PO 12.  Code Status: FULL  Levert Feinstein, MD Family Medicine PGY-1

## 2012-07-29 ENCOUNTER — Observation Stay (HOSPITAL_COMMUNITY): Payer: Medicare Other

## 2012-07-29 DIAGNOSIS — R112 Nausea with vomiting, unspecified: Secondary | ICD-10-CM

## 2012-07-29 DIAGNOSIS — K829 Disease of gallbladder, unspecified: Secondary | ICD-10-CM

## 2012-07-29 DIAGNOSIS — D6949 Other primary thrombocytopenia: Secondary | ICD-10-CM

## 2012-07-29 LAB — URINALYSIS, ROUTINE W REFLEX MICROSCOPIC
Glucose, UA: NEGATIVE mg/dL
Ketones, ur: NEGATIVE mg/dL
Leukocytes, UA: NEGATIVE
Nitrite: NEGATIVE
Specific Gravity, Urine: 1.009 (ref 1.005–1.030)
pH: 6 (ref 5.0–8.0)

## 2012-07-29 LAB — BASIC METABOLIC PANEL
CO2: 22 mEq/L (ref 19–32)
Chloride: 100 mEq/L (ref 96–112)
GFR calc Af Amer: >90 mL/min (ref >90)
Potassium: 4.1 mEq/L (ref 3.5–5.1)
Sodium: 129 mEq/L — ABNORMAL LOW (ref 135–145)

## 2012-07-29 LAB — CBC WITH DIFFERENTIAL/PLATELET
Basophils Absolute: 0 10*3/uL (ref 0.0–0.1)
HCT: 28.4 % — ABNORMAL LOW (ref 36.0–46.0)
Lymphocytes Relative: 11 % — ABNORMAL LOW (ref 12–46)
Neutro Abs: 2.9 10*3/uL (ref 1.7–7.7)
Neutrophils Relative %: 75 % (ref 43–77)
Platelets: 67 10*3/uL — ABNORMAL LOW (ref 150–400)
RDW: 13.4 % (ref 11.5–15.5)
WBC: 3.9 10*3/uL — ABNORMAL LOW (ref 4.0–10.5)

## 2012-07-29 LAB — GRAM STAIN: Gram Stain: NONE SEEN

## 2012-07-29 MED ORDER — MORPHINE SULFATE 2 MG/ML IJ SOLN
2.0000 mg | INTRAMUSCULAR | Status: DC | PRN
  Administered 2012-07-29 – 2012-07-30 (×4): 2 mg via INTRAVENOUS
  Filled 2012-07-29 (×5): qty 1

## 2012-07-29 MED ORDER — ALUM & MAG HYDROXIDE-SIMETH 200-200-20 MG/5ML PO SUSP
15.0000 mL | ORAL | Status: DC | PRN
  Administered 2012-07-29: 15 mL via ORAL
  Filled 2012-07-29: qty 30

## 2012-07-29 MED ORDER — FUROSEMIDE 40 MG PO TABS
40.0000 mg | ORAL_TABLET | Freq: Every day | ORAL | Status: DC
  Administered 2012-07-29 – 2012-07-30 (×2): 40 mg via ORAL
  Filled 2012-07-29 (×2): qty 1

## 2012-07-29 NOTE — Progress Notes (Signed)
FMTS Attending Daily Note:  Renold Don MD  [phone] pager  Family Practice pager:  [phone] I have seen and examined this patient and have reviewed their chart. I have discussed this patient with the resident. I agree with the resident's findings, assessment and care plan.  Some abdominal distention today as well as suprapubic pain, which has been going on since I saw her on outpatient basis last week.  I can palpate fluid wave.  Recommend abdominal U/S to evaluate for ascites with possible paracentesis later today pending results.  No signs/sx's of SBP.  Able to tolerate some PO (eggs, biscuit) for breakfast.  KVO IVF.

## 2012-07-29 NOTE — Progress Notes (Signed)
Family Medicine Teaching Service Daily Progress Note Service Page: [phone]  Patient Assessment: [name] is a 26 y.o. year old female with history of autoimmune hepatitis, cirrhosis/ascites, and sclerosing cholangitis presenting with persistent nausea and vomiting, tachycardia.   Subjective: worsened abdominal pain this morning after eating. No emesis since prior to admission.   Objective: Temp:  [97.6 F (36.4 C)-98.8 F (37.1 C)] 97.7 F (36.5 C) (12/20 2130) Pulse Rate:  [86-94] 94  (12/20 2130) Resp:  [18-20] 18  (12/20 2130) BP: (97-135)/(61-72) 135/67 mmHg (12/20 2130) SpO2:  [94 %-100 %] 94 % (12/20 2130) Weight:  [167 lb 1.7 oz (75.8 kg)] 167 lb 1.7 oz (75.8 kg) (12/20 2130) Exam: General: NAD, lying flat in bed. Cardiovascular: 3/6 murmur at LUSB, regular rate and rhythm HEENT: scleral icterus present Respiratory: normal respiratory effort, CTA Abdomen: mildly distended. TTP in lower suprapubic area. Pain with moderate palpation referred to suprapubic area. No rebound or guarding. Extremities: trace B LE edema.   I have reviewed the patient's medications, labs, imaging, and diagnostic testing.  Notable results are summarized below.  Medications:  Scheduled Meds: Azathioprine 50mg  daily Colace 100mg  BID Heparin 5000 units TID Keppra 750mg  daily Reglan 10mg  TID Spironolactone 100mg  BID Tramadol 50mg  q6h  PRN Meds: Xanax 0.5-1mg  QHS prn Benadryl 25mg  q6h prn itching Lasix 40mg  4 times daily prn Lactulose 10g PO daily prn Zofran 4mg  q6h prn  IVF: NS @ 125cc/hr  Labs:  CBC  Lab 07/28/12 0455 07/27/12 1200  WBC 4.9 5.7  HGB 10.2* 11.3*  HCT 29.3* 32.2*  PLT 76* 97*    BMET  Lab 07/28/12 0455 07/27/12 1200  NA 130* 128*  K 4.0 4.3  CL 101 95*  CO2 21 24  BUN 10 12  CREATININE 0.63 0.71  GLUCOSE 90 88  CALCIUM 8.3* 8.9    Imaging/Diagnostic Tests: None  Assessment/Plan:  [name] is a 26 y.o. year old female with history of  autoimmune hepatitis, cirrhosis/ascites, and sclerosing cholangitis presenting with persistent nausea/vomiting and tachycardia.   1. Nausea/Vomiting: Likely viral enteritis, less likely infectious process such as SBP but pain is worsened today. UA concerning for possible UTI, but patient is asymptomatic and most recent urine culture was negative.  Lipase 29. Ammonia 33.  1. Schedule PO reglan 2. MIVF NS @ 125 cc/hr 3. Consider Abd [place] and diagnostic/therapeutic paracentesis today 4. Consider GI consult if symptoms worsen  5. Tachycardia: Resolved with IVF. Likely secondary to pain and Dehydration.  -IIVF NS @ 125 cc/hr and push clear liquids. 6. Monitor on telemetry. EKG in AM.  7. Pain control. Slight worsened. Start morphine 2mg  q4 prn. DC tramadol with her hx of seizure disorder. 2. Autoimmune Hepatitis: Stable. Continue Imuran. Restart daily lasix, continue spironolactone BID. 3. 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 Arlyce Dice. Continue Protonix and Nadolol. 4. NASH: Continue Lactulose and Lasix per home doses. 5. Seizure Disorder: No seizure activity at this time. Continue home Keppra 750 mg Daily. 6. Sclerosing Cholangitis: Continue Imuran. 7. Upper and lower extremity cramping: Potassium 4.3 upon admission. Hold K-Dur for now. Recheck K 8. FEN/GI: Clear Liquid Diet, Advance as Tolerated, NS @ 125 cc/hr. Follow potassium. Lactulose ordered prn. Restart daily lasix. Daily weights, watch volume with cirrhosis, increased weight 7 lbs. 9. Prophylaxis: Heparin SQ TID 10. Disposition: Pending further workup and clinical improvement. 11. Code Status: FULL  Lloyd Huger, MD [facility] Resident - PGY-3 07/29/2012 8:45 AM

## 2012-07-30 ENCOUNTER — Other Ambulatory Visit: Payer: Self-pay

## 2012-07-30 ENCOUNTER — Other Ambulatory Visit: Payer: Self-pay | Admitting: Family Medicine

## 2012-07-30 DIAGNOSIS — D696 Thrombocytopenia, unspecified: Secondary | ICD-10-CM

## 2012-07-30 LAB — BASIC METABOLIC PANEL
BUN: 7 mg/dL (ref 6–23)
CO2: 23 mEq/L (ref 19–32)
Chloride: 99 mEq/L (ref 96–112)
Creatinine, Ser: 0.56 mg/dL (ref 0.50–1.10)
Potassium: 3.9 mEq/L (ref 3.5–5.1)

## 2012-07-30 LAB — CBC
HCT: 24.6 % — ABNORMAL LOW (ref 36.0–46.0)
MCV: 107 fL — ABNORMAL HIGH (ref 78.0–100.0)
Platelets: 52 10*3/uL — ABNORMAL LOW (ref 150–400)
RBC: 2.3 MIL/uL — ABNORMAL LOW (ref 3.87–5.11)
WBC: 3.3 10*3/uL — ABNORMAL LOW (ref 4.0–10.5)

## 2012-07-30 MED ORDER — CODEINE SULFATE 30 MG PO TABS: 30.0000 mg | ORAL_TABLET | Freq: Four times a day (QID) | ORAL | Status: DC | PRN

## 2012-07-30 MED ORDER — ACETAMINOPHEN 325 MG PO TABS
650.0000 mg | ORAL_TABLET | Freq: Four times a day (QID) | ORAL | Status: DC | PRN
  Administered 2012-07-30: 650 mg via ORAL
  Filled 2012-07-30: qty 2

## 2012-07-30 MED ORDER — PANTOPRAZOLE SODIUM 40 MG PO TBEC: 40.0000 mg | DELAYED_RELEASE_TABLET | Freq: Two times a day (BID) | ORAL | Status: DC

## 2012-07-30 MED ORDER — FUROSEMIDE 40 MG PO TABS: 40.0000 mg | ORAL_TABLET | Freq: Every day | ORAL | Status: DC

## 2012-07-30 NOTE — Progress Notes (Signed)
FMTS Attending Daily Note:  Jeff Deziyah Arvin MD  [phone] pager  Family Practice pager:  [phone] I have seen and examined this patient and have reviewed their chart. I have discussed this patient with the resident. I agree with the resident's findings, assessment and care plan.  See my separate note for details.   

## 2012-07-30 NOTE — ED Provider Notes (Signed)
Medical screening examination/treatment/procedure(s) were performed by non-physician practitioner and as supervising physician I was immediately available for consultation/collaboration.   Loren Racer, MD 07/30/12 0005

## 2012-07-30 NOTE — Progress Notes (Signed)
Family Medicine Teaching Service Daily Progress Note Service Page: [phone]  Patient Assessment: [name] is a 26 y.o. year old female with history of autoimmune hepatitis, cirrhosis/ascites, and sclerosing cholangitis presenting with persistent nausea and vomiting, tachycardia.   Subjective: Able to eat well. Has been getting some heartburn-like pain after eating. Abdominal pain improved, and patient says she would like to go home today. Did have some pain with BM today, but no blood in BM.  Objective: Temp:  [97.9 F (36.6 C)-98.3 F (36.8 C)] 98 F (36.7 C) (12/22 0529) Pulse Rate:  [95-100] 100  (12/22 0529) Resp:  [16-18] 16  (12/22 0529) BP: (110-124)/(55-66) 124/55 mmHg (12/22 0529) SpO2:  [97 %-100 %] 99 % (12/22 0529) Weight:  [170 lb 4.8 oz (77.248 kg)] 170 lb 4.8 oz (77.248 kg) (12/21 2125) (up 3 lb)  Exam: General: NAD, lying flat in bed. Cardiovascular: 3/6 murmur loudest at LUSB, regular rate and rhythm HEENT: scleral icterus present Respiratory: normal respiratory effort, CTAB Abdomen: TTP in lower suprapubic area, otherwise nontender to palpation. No rebound or guarding. Extremities: trace bilateral LE edema.   I have reviewed the patient's medications, labs, imaging, and diagnostic testing.  Notable results are summarized below.  Medications:  Scheduled Meds: Azathioprine 50mg  daily Colace 100mg  BID Heparin 5000 units TID Keppra 750mg  daily Reglan 10mg  TID Spironolactone 100mg  BID Tramadol 50mg  q6h  PRN Meds: Xanax 0.5-1mg  QHS prn Benadryl 25mg  q6h prn itching Lactulose 10g PO daily prn Zofran 4mg  q6h prn Maalox 15mL q4h prn  IVF: NS @ KVO  Labs:  CBC  Lab 07/30/12 0715 07/29/12 1102 07/28/12 0455  WBC 3.3* 3.9* 4.9  HGB 8.7* 9.8* 10.2*  HCT 24.6* 28.4* 29.3*  PLT 52* 67* 76*    BMET  Lab 07/30/12 0715 07/29/12 1108 07/28/12 0455  NA 130* 129* 130*  K 3.9 4.1 4.0  CL 99 100 101  CO2 23 22 21   BUN 7 6 10   CREATININE 0.56 0.60  0.63  GLUCOSE 85 88 90  CALCIUM 7.9* 8.2* 8.3*   Urine cx negative  Imaging/Diagnostic Tests:  12/21 Abd U/S Limited: No ascites  EKG: Normal sinus rhythm  Assessment/Plan:  [name] is a 26 y.o. year old female with history of autoimmune hepatitis, cirrhosis/ascites, and sclerosing cholangitis presenting with persistent nausea/vomiting and tachycardia.   # Nausea/Vomiting: Likely viral enteritis, less likely infectious process such as SBP. Lipase 29. Ammonia 33.  -Symptoms improved -Continue scheduled PO reglan -Abd u/s shows no ascites -Consider GI consult if symptoms worsen   # Suprapubic Pain: - UA on admission concerning for possible UTI, but patient is asymptomatic and admission urine culture negative. - repeat urine from 11/21: gram stain appears contaminated due to multiple morphotypes, culture pending  # Tachycardia: Resolved with IVF. Likely secondary to pain and Dehydration -Monitor on telemetry. EKG in AM.  -Pain control - doing well. Will dc home with short course of PO narcotics (ibuprofen allergy, want to avoid tylenol due to liver disease, no tramadol as hx of seizure d/o)  # Thrombocytopenia: - appears chronic in nature, likely secondary to hepatic disease, although lower than usual today at 52 - lab review shows history of platelets as low as 30s - will consider repeat CBC at f/u appt if able to d/c today  # Autoimmune Hepatitis: Stable. Continue Imuran. Restart daily lasix, continue spironolactone BID.  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 Arlyce Dice. Continue Protonix and Nadolol.  # NASH: Continue Lactulose and Lasix  per home doses.  # Seizure Disorder: No seizure activity at this time. Continue home Keppra 750 mg Daily.  # Sclerosing Cholangitis: Continue Imuran.  # Upper and lower extremity cramping: Potassium 4.3 upon admission. Hold K-Dur for now.  # FEN/GI:  - Regular diet - KVO IV.  - Follow potassium.  - Lactulose ordered  prn.  - Daily weights, watch volume with cirrhosis, increased weight 7 lbs.  # Prophylaxis: Heparin SQ TID  # Disposition: probable d/c later today  # Code Status: FULL   Levert Feinstein, MD Family Medicine PGY-1

## 2012-07-30 NOTE — Progress Notes (Signed)
Medications and follow up appts reviewed with patient. Understanding verbalized. Condition stable upon discharge.

## 2012-07-30 NOTE — Progress Notes (Signed)
Patient complained of her throat burning during drinking or when she hiccupped. MD notified and orders. Patient given Maalox 15ml po. Will continue to monitor.

## 2012-07-30 NOTE — Progress Notes (Signed)
S:  Patient awake, groggy.  States only pain now is reflux after meals.  Otherwise not complaining of abdominal pain.  No vomiting since she's been in hospital  Exam:   Gen:  Groggy appearing, but coherent.  NAD Heart:  RRR with Grade III/VI SEM Lungs:  NO crackles Abdomen:  Minimal epigastric tenderness, otherwise none.  No fluid wave.   Ext:  +2 edema BL   Imp/Plan: 1.  Abdominal pain/N/V - improved. 2.  Sclerosing cholangitis 3.  Autoimmune hepatitis.    - Protonix po bid - DC morphine as groggy appearing after administration, pain not severe.  - Agree with stopping Tramadol with hx/o sz do.  States she has hydrocodone at home.  Can try Codeine to limit opiates if needed.   - Continue Lactulose - DC IVF, extra dose of lasix now  - Need to clarify when to stop Reglan on outpatient basis - Can DC home today.    Tobey Grim, MD 10:25 AM 07/30/2012

## 2012-07-31 ENCOUNTER — Other Ambulatory Visit: Payer: Medicare Other

## 2012-07-31 DIAGNOSIS — D696 Thrombocytopenia, unspecified: Secondary | ICD-10-CM

## 2012-07-31 LAB — URINE CULTURE: Colony Count: 75000

## 2012-07-31 NOTE — Discharge Summary (Signed)
Family Medicine Teaching [facility] Discharge Summary  Patient name: [name] Medical record number: [id number] Date of birth: [birth year] Age: 26 y.o. Gender: female Date of Admission: 07/27/2012  Date of Discharge: 07/31/12 Admitting Physician: Tobin Chad, MD  Primary Care Provider: Lloyd Huger, MD  Indication for Hospitalization: nausea, vomiting, tachycardia  Discharge Diagnoses:  1. Nausea & vomiting 2. Tachycardia 3. Suprapubic pain 4. Thrombocytopenia 5. Autoimmune hepatitis 6. Esophageal varices 7. NASH 8. Seizure disorder 9. Sclerosing cholangitis 10. Upper & lower extremity cramping  Brief Hospital Course:  [name] is a 26 y.o. female with a hx of autoimmune hepatitis, cirrhosis/ascites, and sclerosing cholangitis who was admitted to the hospital after presenting to the ER with persistent nausea, vomiting, and tachycardia.  # Nausea/Vomiting - this was believed to be due to viral enteritis. An abdominal ultrasound was done which showed no ascites, so further workup for SBP was not attempted. Her symptoms improved in the hospital and she was able to tolerate PO intake prior to discharge.   # Suprapubic pain - a urinalysis on admission was concerning for possible UTI but pt was asymptomatic on admission and her urine culture was negative. She began to have suprapubic pain during this hospitalization and a repeat urine gram stain appeared to be contaminated due to multiple morphotypes. A culture was pending at the time of d/c.   # Tachycardia - resolved with administration of IV fluids, was likely due to pain and dehydration.   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 Platelets trended down during hospitalization, and were 52  on day of discharge. A heparin-induced thrombocytopenia panel was drawn prior to discharge as pt had been receiving heparin for DVT prophylaxis. This panel was pending as of d/c. A lab appt was set up for the day after discharge to have platelets rechecked at the [facility] clinic.  # Autoimmune hepatitis - continued imuran. Restarted daily lasix and continued home spironolactone BID.  # Esophageal varices - these were found on recent EGD by Dr. Arlyce Dice. We continued pt's protonix and nadool.  # NASH - We continued pt's lactulose and lasix.  # Seizure d/o - continued home keppra 750mg  daily. We discontinued pt's home tramadol due to hx of seizure d/o.  # Sclerosing cholangitis - continued imuran.  # upper & lower ext cramping - potassium was normal upon admission. We held pt's home k-dur during admission.  # prophylaxis - pt was given heparin SQ TID  Consultations: none  Procedures: none  Significant Labs and Imaging:  12/21 Abd U/S Limited:  No ascites   EKG:  Normal sinus rhythm  Urine cx from admission - no growth  CBC  Lab  07/30/12 0715  07/29/12 1102  07/28/12 0455   WBC  3.3*  3.9*  4.9   HGB  8.7*  9.8*  10.2*   HCT  24.6*  28.4*  29.3*   PLT  52*  67*  76*    BMET  Lab  07/30/12 0715  07/29/12 1108  07/28/12 0455   NA  130*  129*  130*   K  3.9  4.1  4.0   CL  99  100  101   CO2  23  22  21    BUN  7  6  10    CREATININE  0.56  0.60  0.63   GLUCOSE  85  88  90   CALCIUM  7.9*  8.2*  8.3*     Discharge Medications:    Medication List     As of 07/31/2012 11:37 PM    STOP taking these medications         HYDROcodone-acetaminophen 5-500 MG per tablet   Commonly known as: VICODIN      pantoprazole 40 MG tablet   Commonly known as: PROTONIX      promethazine 25 MG tablet   Commonly known as: PHENERGAN      traMADol 50 MG tablet   Commonly known as: ULTRAM      TAKE these medications         ALPRAZolam 0.5 MG tablet   Commonly known as: XANAX   Take  0.5-1 mg by mouth at bedtime as needed. Take 1-2 tablets at bedtime as needed for sleep      azaTHIOprine 50 MG tablet   Commonly known as: IMURAN   Take 50 mg by mouth daily.      codeine 30 MG tablet   Take 1 tablet (30 mg total) by mouth every 6 (six) hours as needed for pain.      EPINEPHrine 0.3 mg/0.3 mL Devi   Commonly known as: EPI-PEN   Inject 0.3 mg into the muscle once.      furosemide 40 MG tablet   Commonly known as: LASIX   Take 40 mg by mouth 4 (four) times daily as needed. Takes 40 mg daily and extras doses if needed for swelling.      Lactulose 20 GM/30ML Soln   Take 15 mLs by mouth daily as needed. For constipation      levETIRAcetam 750 MG tablet   Commonly known as: KEPPRA   Take 750 mg by mouth daily.      metoCLOPramide 10 MG tablet   Commonly known as: REGLAN   Take 10 mg by mouth 4 (four) times daily.      omeprazole 20 MG capsule   Commonly known as: PRILOSEC   Take 20 mg by mouth daily.      ondansetron 4 MG disintegrating tablet   Commonly known as: ZOFRAN-ODT   Take 4 mg by mouth every 6 (six) hours as needed. For nausea      potassium chloride SA 20 MEQ tablet   Commonly known as: K-DUR,KLOR-CON   Take 20 mEq by mouth 2 (two) times daily.      promethazine 25 MG suppository   Commonly known as: PHENERGAN   Place 25 mg rectally every 6 (six) hours as needed. For nausea      spironolactone 100 MG tablet   Commonly known as: ALDACTONE   Take 100 mg by mouth 2 (two) times daily.        Issues for Follow Up:  -Monitor CBCs for thrombocytopenia (has hx of this), f/u HIT panel -pt has appt to check CBC on 12/23 (just lab appt) -monitor pain, as we d/c'd pt's home tramadol due to hx of seizure d/o -recommend checking BMET at MD f/u appt to ensure K regulation appropriate as is on lasix, spironolactone, & k-dur  Outstanding Results: HIT panel      Follow-up Information    Schedule an appointment as soon as possible for a visit with  Lloyd Huger, MD. (call Monday morning to schedule an appointment for this week.)  Contact information:   [address] [place] [address] [phone]       Follow up with [facility]. On 07/31/2012. (at 11:15am to check bloodwork only.)    Contact information:   [address] [place] [address] [phone]      Schedule an appointment as soon as possible for a visit with Thornton Papas, MD. (for your low platelets.)    Contact information:   [address] [place] [address] [phone]          Discharge Condition: stable  Discharge Instructions: Please refer to Patient Instructions section of EMR for full details.  Patient was counseled important signs and symptoms that should prompt return to medical care, changes in medications, dietary instructions, activity restrictions, and follow up appointments.    Levert Feinstein, MD Family Medicine PGY-1

## 2012-07-31 NOTE — Progress Notes (Signed)
CBC DONE TODAY [name] 

## 2012-08-01 ENCOUNTER — Other Ambulatory Visit: Payer: Self-pay | Admitting: *Deleted

## 2012-08-01 ENCOUNTER — Telehealth: Payer: Self-pay | Admitting: *Deleted

## 2012-08-01 ENCOUNTER — Telehealth: Payer: Self-pay | Admitting: Oncology

## 2012-08-01 DIAGNOSIS — D696 Thrombocytopenia, unspecified: Secondary | ICD-10-CM

## 2012-08-01 LAB — CBC
MCHC: 36.2 g/dL — ABNORMAL HIGH (ref 30.0–36.0)
RDW: 14.1 % (ref 11.5–15.5)

## 2012-08-01 NOTE — Telephone Encounter (Signed)
VERBAL ORDER AND READ ORDER TO DR.SHERRILL- PT. TO COME FOR WEEKLY CBC AND TO BE SEEN IN THREE TO FOUR WEEKS. NOTIFIED PT. OF ABOVE ORDERS AND THAT A SCHEDULER WILL BE CALLING HER TO MAKE HER APPOINTMENTS. PT. VOICES UNDERSTANDING.

## 2012-08-01 NOTE — Telephone Encounter (Signed)
pt called with lab appts and will get a complete sch on 12/30    anne

## 2012-08-01 NOTE — Telephone Encounter (Addendum)
ON 07/26/12 PT. WENT TO THE EMERGENCY ROOM WITH NAUSEA AND VOMITING. SHE WAS ALSO TOLD HER PLATELETS WERE LOW. PT. WAS ADMITTED TO THE HOSPITAL WITH A VIRAL INFECTION AND DISCHARGED ON 07/30/12 WITH INSTRUCTIONS TO FOLLOW UP WITH HER PHYSICIANS. YESTERDAY PT. HAD NAUSEA AND VOMITED FOUR TIMES. SHE FEELS BETTER TODAY AND WOULD LIKE TO SEE DR.SHERRILL. THIS NOTE TO DR.SHERRILL'S NURSE, SUSAN COWARD,RN.

## 2012-08-03 LAB — HEPARIN INDUCED THROMBOCYTOPENIA PNL
Patient O.D.: 0.428
UFH High Dose UFH H: 7 % Release
UFH Low Dose 0.5 IU/mL: 8 % Release
UFH SRA Result: NEGATIVE

## 2012-08-07 ENCOUNTER — Ambulatory Visit (INDEPENDENT_AMBULATORY_CARE_PROVIDER_SITE_OTHER): Payer: Medicare Other | Admitting: Family Medicine

## 2012-08-07 ENCOUNTER — Other Ambulatory Visit (HOSPITAL_BASED_OUTPATIENT_CLINIC_OR_DEPARTMENT_OTHER): Payer: Medicare Other | Admitting: Lab

## 2012-08-07 VITALS — BP 108/86 | HR 89 | Temp 99.0°F | Ht 61.0 in | Wt 146.0 lb

## 2012-08-07 DIAGNOSIS — R188 Other ascites: Secondary | ICD-10-CM

## 2012-08-07 DIAGNOSIS — R112 Nausea with vomiting, unspecified: Secondary | ICD-10-CM

## 2012-08-07 DIAGNOSIS — D696 Thrombocytopenia, unspecified: Secondary | ICD-10-CM

## 2012-08-07 DIAGNOSIS — R1032 Left lower quadrant pain: Secondary | ICD-10-CM

## 2012-08-07 DIAGNOSIS — R609 Edema, unspecified: Secondary | ICD-10-CM

## 2012-08-07 DIAGNOSIS — R1031 Right lower quadrant pain: Secondary | ICD-10-CM

## 2012-08-07 DIAGNOSIS — R1013 Epigastric pain: Secondary | ICD-10-CM

## 2012-08-07 DIAGNOSIS — R600 Localized edema: Secondary | ICD-10-CM

## 2012-08-07 LAB — CBC WITH DIFFERENTIAL/PLATELET
Eosinophils Absolute: 0.1 10*3/uL (ref 0.0–0.5)
MONO#: 0.3 10*3/uL (ref 0.1–0.9)
MONO%: 6.6 % (ref 0.0–14.0)
NEUT#: 3.6 10*3/uL (ref 1.5–6.5)
RBC: 2.72 10*6/uL — ABNORMAL LOW (ref 3.70–5.45)
RDW: 14.9 % — ABNORMAL HIGH (ref 11.2–14.5)
WBC: 4.4 10*3/uL (ref 3.9–10.3)

## 2012-08-07 LAB — BASIC METABOLIC PANEL (CC13)
CO2: 24 mEq/L (ref 22–29)
Glucose: 224 mg/dl — ABNORMAL HIGH (ref 70–99)
Potassium: 3.9 mEq/L (ref 3.5–5.1)
Sodium: 133 mEq/L — ABNORMAL LOW (ref 136–145)

## 2012-08-07 NOTE — Discharge Summary (Signed)
Family Medicine Teaching Service  Discharge Note : Attending Jeff Nashawn Hillock MD Pager [phone] Inpatient Team Pager:  [phone]  I have seen and examined this patient, reviewed their chart and discussed discharge planning with the resident at the time of discharge. I agree with the discharge plan as above.  

## 2012-08-07 NOTE — Assessment & Plan Note (Signed)
Improved since resuming diuresis. Her K and electrolytes were stable today. Will not repeat labs drawn at [facility] clinic today.

## 2012-08-07 NOTE — Assessment & Plan Note (Signed)
Abd [place] negative for ascites in hospital, not likely her pain is related to this or SBP.

## 2012-08-07 NOTE — Progress Notes (Signed)
  Subjective:    Patient ID: [name], female    DOB: [birth year], 26 y.o.   MRN: [id number]  HPI Hospital f/u DC 12/23 for nausea/emesis and abdominal pain.  1. GI symptoms. Deemed most likely viral gastroenteritis and treated with support care IVF for dehydration, though she got edematous quickly without her diuretics. She still has some nausea, but last emesis was 2 nights ago. Using phenergan supp and reglan with some improvement. Ate bland potato soup last night, and crackers and cereal today. Drinking plenty of pedialyte and water. Having normal formed stools regularly. No pain currently, no fever, diarrhea, blood in emesis. Had an EGD 3 weeks ago and plans to f/u with Dr. Arlyce Dice in clinic, GI consult not obtained in hospital.  2. Thrombocytopenia. Chronic with varying levels since before 2009, recently during hospital stay varied between 106-52, repeated today by Oncology was 60. Had HIT panel drawn that had weak positivity but not classic for this syndrome. Denies bruising, bleeding.  Followed by [facility] hepatology for autoimmune hepatitis.  3. Suprapubic pain. Not an active complaint today. Her urine culture was negative for pathogens-multifactorial.   Review of Systems See HPI otherwise negative.  reports that she quit smoking about 7 years ago. Her smoking use included Cigarettes. She has never used smokeless tobacco.     Objective:   Physical Exam  Vitals reviewed. Constitutional: She is oriented to person, place, and time. She appears well-developed and well-nourished. No distress.       Smiling, appears well.  HENT:  Head: Normocephalic and atraumatic.  Eyes: Scleral icterus is present.  Cardiovascular: Normal rate, regular rhythm, normal heart sounds and intact distal pulses.   No murmur heard. Pulmonary/Chest: Effort normal and breath sounds normal. No respiratory distress. She has no wheezes. She has no rales.  Abdominal: Soft. Bowel sounds are normal. She exhibits no  distension. There is no tenderness. There is no rebound and no guarding.  Musculoskeletal:       Trace B LE edema, nonpitting. nontender.  Lymphadenopathy:    She has no cervical adenopathy.  Neurological: She is alert and oriented to person, place, and time. Coordination normal.  Skin: No rash noted. She is not diaphoretic.  Psychiatric: She has a normal mood and affect.          Assessment & Plan:

## 2012-08-07 NOTE — Assessment & Plan Note (Signed)
No sign of urinary infection and improved. Consider cervical cultures again if this recurs, were negative earlier this year.

## 2012-08-07 NOTE — Assessment & Plan Note (Signed)
Seems c/w viral gastroenteritis with recent negative EGD and no new bleeding. Improved today. Recommend continue PPI and f/u with Dr. Arlyce Dice.

## 2012-08-07 NOTE — Patient Instructions (Addendum)
This virus may take days to improve. Keep eating bland foods and drinking fluids. Use nausea medication as needed. Make appointment with Dr. Arlyce Dice as soon as possible. Call doctor if getting worse. Make appointment in one week.

## 2012-08-07 NOTE — Assessment & Plan Note (Signed)
Overall seems stable, likely result of chronic liver failure. She will f/u for weekly lab tests for possible transfusions.

## 2012-08-07 NOTE — Assessment & Plan Note (Signed)
Improved today though seems recurrent. No other findings on [place] to suggest alternative pathology currently. Recommend continue anti-emetics (has plenty to use) and f/u in 1-2 weeks or sooner if symptoms worsen.

## 2012-08-10 ENCOUNTER — Other Ambulatory Visit: Payer: Self-pay | Admitting: *Deleted

## 2012-08-10 ENCOUNTER — Telehealth: Payer: Self-pay | Admitting: *Deleted

## 2012-08-10 NOTE — Telephone Encounter (Signed)
Message copied by Gala Romney on Thu Aug 10, 2012 12:34 PM ------      Message from: Thornton Papas B      Created: Mon Aug 07, 2012 10:28 PM       Please call patient, platelets are stable, f/u as scheduled

## 2012-08-10 NOTE — Telephone Encounter (Signed)
Called and spoke with pt explaining that Platelets are stable, f/u with next appt.  Pt verbalized understanding and confirmed appt for 08/31/12.

## 2012-08-11 ENCOUNTER — Telehealth: Payer: Self-pay | Admitting: Family Medicine

## 2012-08-11 NOTE — Telephone Encounter (Signed)
Patient is calling because her symptoms still haven't cleared up and she doesn't know what she needs to do next.

## 2012-08-14 ENCOUNTER — Ambulatory Visit: Payer: Medicare Other | Admitting: Gastroenterology

## 2012-08-14 ENCOUNTER — Other Ambulatory Visit (HOSPITAL_BASED_OUTPATIENT_CLINIC_OR_DEPARTMENT_OTHER): Payer: Medicare Other

## 2012-08-14 DIAGNOSIS — D649 Anemia, unspecified: Secondary | ICD-10-CM

## 2012-08-14 DIAGNOSIS — D696 Thrombocytopenia, unspecified: Secondary | ICD-10-CM

## 2012-08-14 LAB — CBC WITH DIFFERENTIAL/PLATELET
BASO%: 0.3 % (ref 0.0–2.0)
Basophils Absolute: 0 10*3/uL (ref 0.0–0.1)
HCT: 32.1 % — ABNORMAL LOW (ref 34.8–46.6)
HGB: 11.2 g/dL — ABNORMAL LOW (ref 11.6–15.9)
LYMPH%: 12.1 % — ABNORMAL LOW (ref 14.0–49.7)
MCH: 39.8 pg — ABNORMAL HIGH (ref 25.1–34.0)
MCHC: 34.8 g/dL (ref 31.5–36.0)
MONO#: 0.4 10*3/uL (ref 0.1–0.9)
NEUT%: 78.3 % — ABNORMAL HIGH (ref 38.4–76.8)
Platelets: 63 10*3/uL — ABNORMAL LOW (ref 145–400)
WBC: 4.7 10*3/uL (ref 3.9–10.3)

## 2012-08-14 NOTE — Telephone Encounter (Signed)
Patient scheduled for appointment with Dr. Cristal Ford 1/8 @ 1:45pm

## 2012-08-15 ENCOUNTER — Telehealth: Payer: Self-pay | Admitting: *Deleted

## 2012-08-15 NOTE — Telephone Encounter (Signed)
Left message on voicemail for pt to call office re: labs.

## 2012-08-15 NOTE — Telephone Encounter (Signed)
Message copied by Caleb Popp on Tue Aug 15, 2012 12:46 PM ------      Message from: Thornton Papas B      Created: Mon Aug 14, 2012  8:54 PM       Please call patient, platelets stable, continue off of promacta

## 2012-08-16 ENCOUNTER — Ambulatory Visit (INDEPENDENT_AMBULATORY_CARE_PROVIDER_SITE_OTHER): Payer: Medicare Other | Admitting: Family Medicine

## 2012-08-16 ENCOUNTER — Encounter: Payer: Self-pay | Admitting: Family Medicine

## 2012-08-16 VITALS — BP 127/89 | HR 94 | Temp 99.0°F | Ht 61.0 in | Wt 147.4 lb

## 2012-08-16 DIAGNOSIS — R1013 Epigastric pain: Secondary | ICD-10-CM

## 2012-08-16 DIAGNOSIS — R112 Nausea with vomiting, unspecified: Secondary | ICD-10-CM

## 2012-08-16 DIAGNOSIS — I8500 Esophageal varices without bleeding: Secondary | ICD-10-CM

## 2012-08-16 DIAGNOSIS — K7460 Unspecified cirrhosis of liver: Secondary | ICD-10-CM

## 2012-08-16 MED ORDER — PANTOPRAZOLE SODIUM 40 MG PO TBEC: 40.0000 mg | DELAYED_RELEASE_TABLET | Freq: Every day | ORAL | Status: DC

## 2012-08-16 MED ORDER — SUCRALFATE 1 GM/10ML PO SUSP: 1.0000 g | Freq: Two times a day (BID) | ORAL | Status: DC | PRN

## 2012-08-16 MED ORDER — LACTULOSE 20 GM/30ML PO SOLN: 30.0000 mL | Freq: Every day | ORAL | Status: DC | PRN

## 2012-08-16 NOTE — Assessment & Plan Note (Signed)
Present on recent EGD. Advised pt to restart nadolol.

## 2012-08-16 NOTE — Assessment & Plan Note (Addendum)
Uncertain etiology with normal EGD recently. Differential inludes gastroparesis following gastroenteritis vs unlikely PUD with normal EGD vs symptomatic esophageal varices. Restart protonix, trial of carafate. F/u with GI scheduled.

## 2012-08-16 NOTE — Assessment & Plan Note (Signed)
Will increase lactulose to 30 mL per day for better effect. F/u in 2 weeks.

## 2012-08-16 NOTE — Assessment & Plan Note (Signed)
Persistent, no emesis in 2 days. Perhaps gastroparesis after enteritis. Recommend continue antiemetics. Bland diet. F/u 2 weeks or sooner if emesis returns.

## 2012-08-16 NOTE — Patient Instructions (Addendum)
Make appointment with Dr. Arlyce Dice. Increase lactulose to 30 mL daily. You can try carafate on an empty stomach for pain. Do not take with your other medication. If this doesn't help, stop taking it. Restart taking the nadolol.  Restart the protonix. I sent the prescriptions to [facility]. Start eating bland, soft foods-apple sauce, crackers.  If symptoms worsen, call doctor. Make appointment in 2 weeks.

## 2012-08-16 NOTE — Progress Notes (Signed)
Subjective:     Patient ID: [name], female   DOB: [birth year], 27 y.o.   MRN: [id number]  HPI TerriRojas has had ongoing 10/10 burning epigastric pain since she was last seen in the office on Dec 30. She describes the pain as burning, constant and worsened by eating. She also complains of associated nausea with occasional non-bloody vomiting with the last episode being 2 days ago. She also complains of painful daily bowel movements.  She is not taking PPI-states her GI at [facility] didn't refill this, but it was never discontinued. She is not taking nadolol. She is using lactulose, one gulp per day. Still feels constipated.  She denies fevers, chills, night sweats, hematochezia and abdominal swelling.   Review of Systems  Constitutional: Positive for appetite change.  HENT: Negative.   Respiratory: Negative.   Cardiovascular: Negative.   Gastrointestinal: Positive for nausea, vomiting and abdominal pain.  Musculoskeletal: Negative.   Skin: Negative.        Objective:   Physical Exam  Vitals reviewed. Constitutional:       Weight is up 1 lb   General: [name] was laying on her side on the examining table seeming uncomfortable HEENT: Sclera showed icterus, head was atraumatic and normocephalic, PEERL Abdomen: no evidence of ascites, tender to light palpation in upper quadrants and epigastrium, bowel sounds were present CV: Regular rate and rhythm, no murmurs or rubs appreciated Respiratory: lungs clear to auscultation bilaterally.       Assessment:     Persistent nausea and vomiting  Chronic cirrhosis with esophageal varices Stable thrombocytopenia     Plan:     Uncertain etiology of persistent n/v: gastroparesis following gastroenteritis vs unlikely PUD with normal EGD vs symptomatic esophageal varices   Restart taking Naldolol for esophageal varices (pt noncompliance) Trial of carafate for burning epigastric discomfort  Refilled PPI-patient ran out of this weeks  ago. Try tolerating a bland and soft diet. Antiemetics prn. Keep hydrated by drinking lots of fluids  Follow up with gastroenterologist planned.  Advised pt to f/u in clinic in 1-2 weeks or sooner if worsens or unable to keep down fluids.     I have seen and examined patient with MS4 [name].  I have made edits to above.

## 2012-08-17 ENCOUNTER — Telehealth: Payer: Self-pay | Admitting: Gastroenterology

## 2012-08-17 NOTE — Telephone Encounter (Signed)
Pt states that since she had her EGD she has been having problems with nausea and abdominal pain. States she is not able to keep solids down. Pt requesting to be seen sooner than 1st available. Pt scheduled to see Amy Esterwood PA 08/21/12@9 :30am. Pt aware of appt date and time.

## 2012-08-21 ENCOUNTER — Other Ambulatory Visit: Payer: Medicare Other

## 2012-08-21 ENCOUNTER — Ambulatory Visit: Payer: Medicare Other | Admitting: Physician Assistant

## 2012-08-30 ENCOUNTER — Ambulatory Visit: Payer: Medicare Other | Admitting: Family Medicine

## 2012-08-31 ENCOUNTER — Ambulatory Visit: Payer: Medicare Other | Admitting: Oncology

## 2012-08-31 ENCOUNTER — Other Ambulatory Visit: Payer: Medicare Other | Admitting: Lab

## 2012-09-08 ENCOUNTER — Other Ambulatory Visit: Payer: Medicare Other | Admitting: Lab

## 2012-09-08 ENCOUNTER — Ambulatory Visit: Payer: Medicare Other | Admitting: Oncology

## 2012-09-19 ENCOUNTER — Ambulatory Visit: Payer: Medicare Other | Admitting: Family Medicine

## 2012-09-19 ENCOUNTER — Ambulatory Visit (INDEPENDENT_AMBULATORY_CARE_PROVIDER_SITE_OTHER): Payer: Medicare Other | Admitting: Family Medicine

## 2012-09-19 ENCOUNTER — Encounter: Payer: Self-pay | Admitting: Family Medicine

## 2012-09-19 VITALS — BP 150/72 | HR 97 | Temp 98.5°F | Ht 61.0 in | Wt 171.0 lb

## 2012-09-19 DIAGNOSIS — R188 Other ascites: Secondary | ICD-10-CM

## 2012-09-19 DIAGNOSIS — R350 Frequency of micturition: Secondary | ICD-10-CM

## 2012-09-19 LAB — COMPREHENSIVE METABOLIC PANEL
ALT: 70 U/L — ABNORMAL HIGH (ref 0–35)
AST: 184 U/L — ABNORMAL HIGH (ref 0–37)
Alkaline Phosphatase: 960 U/L — ABNORMAL HIGH (ref 39–117)
BUN: 10 mg/dL (ref 6–23)
Creat: 0.58 mg/dL (ref 0.50–1.10)
Total Bilirubin: 11 mg/dL — ABNORMAL HIGH (ref 0.3–1.2)

## 2012-09-19 LAB — POCT URINALYSIS DIPSTICK
Leukocytes, UA: NEGATIVE
Nitrite, UA: NEGATIVE
Protein, UA: 100
Urobilinogen, UA: >=8
pH, UA: 6.5

## 2012-09-19 LAB — PROTIME-INR: INR: 1.43 (ref <1.50)

## 2012-09-19 LAB — POCT UA - MICROSCOPIC ONLY: Epithelial cells, urine per micros: >20

## 2012-09-19 MED ORDER — TORSEMIDE 20 MG PO TABS: 40.0000 mg | ORAL_TABLET | Freq: Four times a day (QID) | ORAL | Status: DC

## 2012-09-19 MED ORDER — POTASSIUM CHLORIDE CRYS ER 20 MEQ PO TBCR: 20.0000 meq | EXTENDED_RELEASE_TABLET | Freq: Two times a day (BID) | ORAL | Status: DC

## 2012-09-19 MED ORDER — SPIRONOLACTONE 100 MG PO TABS: 200.0000 mg | ORAL_TABLET | Freq: Every day | ORAL | Status: DC

## 2012-09-19 NOTE — Progress Notes (Signed)
  Subjective:    Patient ID: [name], female    DOB: 01/01/1986, 27 y.o.   MRN: [id number]  HPI # SDA for abdominal swelling and back pain.  This is a complicated 66 YOF with a history of autoimmune hepatitis/cirrhosis and history of intracranial hemorrhage who presents with worsening abdominal swelling, worsening jaundice, and back pain.  She was 147 lbs 01/08 but today weighs 02/11.  She reports compliance with her Lasix and spironolactone. She says sometimes she takes her diuretics but does not feel an urge to urinate and is able to make it through class without urinating.  She is also on Imuran which is prescribed by her GI specialist Dr. Arlyce Dice who she has been seeing recently; she was previously followed at [facility]. She is being considered for liver transplant. Per the patient, she understands that her disease process is severe but stable.   She denies nausea/vomiting/diarrhea, fevers/chills. She has been having daily bowel movements with lactulose; she denies straining or hard stools.   Review of Systems Per HPI Denies dyspnea Denies dysuria, frequency, urgency  Allergies, medication, past medical history reviewed.  Smoking status noted.     Objective:   Physical Exam GEN: NAD HEENT: scleral icterus NEURO: no asterixis CV: RRR, normal S1/S2, no murmurs PULM: NI WOB; CTAB without rales ABD: NABS, soft, markedly distended without obvious fluid wave, suprapubic tenderness, no obvious masses palpable  EXT: 2+ pitting pretibial edema  BACK: tenderness along bilateral thoracic area     Assessment & Plan:

## 2012-09-19 NOTE — Assessment & Plan Note (Signed)
Her abdominal discomfort, swelling, and back pain is likely related to her abdominal ascites that is likely related to her autoimmune hepatitis/cirrhosis. She also presents with lower extremity edema and jaundice.  -We will increase diuretic therapy. Change Lasix 40 mg qid to torsemide 40 mg qid. Continue spironolactone 200 mg daily. We will double her potassium as well on this increased diuretic therapy.   -Check CMET, lipase, PT/INR -Check urinalysis to rule-out potential UTI with her suprapubic/back pain>>>significant for numerous RBC, 100 protein. We will follow-up on albumin and Cr on CMET.  -She was advised to follow-up with Dr. Arlyce Dice this week if possible. We will send him a copy of this office note as well.  -She was advised to follow-up with PCP Dr. Cristal Ford as well to make sure she is being closely monitored>>>She has appointment 02/21.

## 2012-09-19 NOTE — Patient Instructions (Addendum)
STOP furosemide START torsemide. 40 mg four times a day.  CONTINUE spironolactone.  INCREASE potassium to 40 mEq a day (2 tablets of what you already have).  We will check your lab work today.   Follow-up Dr. Cristal Ford. Please make an appointment to see Dr. Arlyce Dice sometime this week if possible. We will send him a note regarding today's visit and your labs.

## 2012-09-21 ENCOUNTER — Telehealth: Payer: Self-pay | Admitting: Family Medicine

## 2012-09-21 NOTE — Telephone Encounter (Signed)
LVM notifying of CMET without significant change from 1 month ago. PT remains is elevated although INR is within upper range of normal. Significant blood and 100 protein in urine.  -She was advised to let me know if she has signs of hypotension on increased diuretic therapy. Give me a call by end of next week at the very least to let me know how she is doing (notably weight changes, improvement/no improvement abdominal ascites). -Keep appointment with Dr. Cristal Ford 02/21.   BLUE TEAM: Please send copy of my last OV note to Dr. Arlyce Dice.

## 2012-09-24 ENCOUNTER — Telehealth: Payer: Self-pay | Admitting: Sports Medicine

## 2012-09-24 NOTE — Telephone Encounter (Signed)
24 Hour Emergency Call Line:  Pt called stated that since she started on Lasix the other day she has had progressive N/V, dry mouth.  Feels like she is dehydrated.  Feels less bloated and not having any resp symptoms.  Reviewing chart noted seen by Dr. 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  Instructed to stop lasix and spironolactone.  If feeling sick enough to need to be seen or persistent N/V can be evaluated in ED.  Can try po fluids and use small amount of Gatorade to help hydration stand point.  Pt to call and schedule appointment tomorrow when clinic opens for day of appointment.   Andrena Mews, DO [facility] Resident - PGY-2 09/24/2012 10:48 AM

## 2012-09-24 NOTE — Telephone Encounter (Signed)
She had returned call on 02/13. She says that she is down to 160 lbs and the swelling in abdomen and legs have gone down considerably. She has appointment with GI and PCP 02/21. She will back down on torsemide from qid to bid once she is down to 145-149 lbs.

## 2012-09-25 ENCOUNTER — Ambulatory Visit (HOSPITAL_COMMUNITY)
Admission: RE | Admit: 2012-09-25 | Discharge: 2012-09-25 | Disposition: A | Discharge status: Home / Self Care | Payer: Medicare Other | Source: Ambulatory Visit | Attending: Family Medicine | Admitting: Family Medicine

## 2012-09-25 ENCOUNTER — Ambulatory Visit (INDEPENDENT_AMBULATORY_CARE_PROVIDER_SITE_OTHER): Payer: Medicare Other | Admitting: Family Medicine

## 2012-09-25 ENCOUNTER — Encounter: Payer: Self-pay | Admitting: Family Medicine

## 2012-09-25 VITALS — BP 131/81 | HR 78 | Temp 98.6°F | Wt 146.2 lb

## 2012-09-25 DIAGNOSIS — I499 Cardiac arrhythmia, unspecified: Secondary | ICD-10-CM | POA: Insufficient documentation

## 2012-09-25 DIAGNOSIS — N390 Urinary tract infection, site not specified: Secondary | ICD-10-CM

## 2012-09-25 DIAGNOSIS — R112 Nausea with vomiting, unspecified: Secondary | ICD-10-CM

## 2012-09-25 DIAGNOSIS — K754 Autoimmune hepatitis: Secondary | ICD-10-CM

## 2012-09-25 DIAGNOSIS — R319 Hematuria, unspecified: Secondary | ICD-10-CM

## 2012-09-25 DIAGNOSIS — R188 Other ascites: Secondary | ICD-10-CM

## 2012-09-25 LAB — CBC WITH DIFFERENTIAL/PLATELET
Basophils Relative: 1 % (ref 0–1)
Eosinophils Absolute: 0 10*3/uL (ref 0.0–0.7)
Hemoglobin: 13.6 g/dL (ref 12.0–15.0)
MCH: 36.3 pg — ABNORMAL HIGH (ref 26.0–34.0)
MCHC: 35.2 g/dL (ref 30.0–36.0)
Monocytes Relative: 8 % (ref 3–12)
Neutro Abs: 6.6 10*3/uL (ref 1.7–7.7)
Neutrophils Relative %: 82 % — ABNORMAL HIGH (ref 43–77)
Platelets: 53 10*3/uL — ABNORMAL LOW (ref 150–400)
RBC: 3.75 MIL/uL — ABNORMAL LOW (ref 3.87–5.11)

## 2012-09-25 LAB — POCT URINALYSIS DIPSTICK
Ketones, UA: 40
Nitrite, UA: POSITIVE
Protein, UA: 100
Urobilinogen, UA: >=8
pH, UA: 6

## 2012-09-25 LAB — BASIC METABOLIC PANEL
BUN: 15 mg/dL (ref 6–23)
Calcium: 8.7 mg/dL (ref 8.4–10.5)
Creat: 0.65 mg/dL (ref 0.50–1.10)

## 2012-09-25 LAB — POCT UA - MICROSCOPIC ONLY: Epithelial cells, urine per micros: >20

## 2012-09-25 MED ORDER — ALPRAZOLAM 0.5 MG PO TABS: 0.5000 mg | ORAL_TABLET | Freq: Every evening | ORAL | Status: DC | PRN

## 2012-09-25 MED ORDER — HYDROCODONE-ACETAMINOPHEN 5-325 MG PO TABS: 1.0000 | ORAL_TABLET | Freq: Four times a day (QID) | ORAL | Status: DC | PRN

## 2012-09-25 NOTE — Progress Notes (Signed)
Patient ID: [name], female   DOB: 1985-09-22, 27 y.o.   MRN: [id number] Subjective: The patient is a 27 y.o. year old female who presents today for n/v.  The patient was seen 6 days ago for increasing abdominal swelling and lower extremity edema. She was placed on torsemide at that time. Several days ago she began having worsening problems with nausea, vomiting, and inability to tolerate orals. She has also been having problems with lightheadedness and palpitations. She stopped taking the torsemide 2 days ago, but her symptoms have not improved.  The patient has tried Reglan, Phenergan, and Zofran. None of these have been effective. She reports that the last time she felt like this she required hospitalization for IV fluids.  Patient's past medical, social, and family history were reviewed and updated as appropriate. History  Substance Use Topics  . Smoking status: Former Smoker    Types: Cigarettes    Quit date: 08/09/2005  . Smokeless tobacco: Never Used  . Alcohol Use: No   Objective:  Filed Vitals:   09/25/12 1350  BP: 131/81  Pulse: 78  Temp: 98.6 F (37 C)   Repeat bp taken while standing was 118/48  Gen: Unwell appearing, tired HEENT: mucous membranes slightly dry, conjunctiva yellow, no adenopathy CV: Borderline tachycardic, irregular, 4/6 systolic murmur Resp: Clear bilaterally Abd: Soft, mildly tender throughout, lower quadrents>upper.  No masses Ext: Trace LE edema Neuro: Alert and oriented, 5/5 strength, able to walk without support, CN 2-12 intact  EKG shows rate 92, QTc of 497, sinus with PVCs.  No ST segment abnormalities, normal axis.   Assessment/Plan:  Please also see individual problems in problem list for problem-specific plans.

## 2012-09-25 NOTE — Assessment & Plan Note (Signed)
The patient has chronic problems with nausea and vomiting, however they're significantly worse at this time. Her urinalysis is somewhat convincing for urinary tract infection which may be contributing, however side this is likely related to overdiuresis and dehydration. The patient does report some lightheadedness which is somewhat improved following 1 L of IV fluids given in the office. The patient was also given a gram of Rocephin and 50 mg of Phenergan IM. After extended discussion with both patient and her mother, we have decided that we will try having her go home with plans to return to the office tomorrow. If she has not begun improving at that point, I would likely consider hospitalization. The patient's other laboratory studies were reviewed and they do not show evidence of overwhelming infection or serious electrolyte derangement.

## 2012-09-25 NOTE — Patient Instructions (Signed)
It was good to see you today.  I'm sorry you are feeling so bad. Continue to hold your torsemide. Plan on coming in to see me tomorrow at 1:30.  If you wake up and are feeling great, you can call and cancel and I will send in an antibiotic prescription.

## 2012-09-26 ENCOUNTER — Telehealth: Payer: Self-pay | Admitting: Family Medicine

## 2012-09-26 ENCOUNTER — Ambulatory Visit: Payer: Medicare Other | Admitting: Family Medicine

## 2012-09-26 MED ORDER — PROMETHAZINE HCL 25 MG/ML IJ SOLN
25.0000 mg | Freq: Once | INTRAMUSCULAR | Status: AC
  Administered 2012-09-25: 25 mg via INTRAMUSCULAR

## 2012-09-26 MED ORDER — CEPHALEXIN 500 MG PO CAPS: 500.0000 mg | ORAL_CAPSULE | Freq: Three times a day (TID) | ORAL | Status: DC

## 2012-09-26 MED ORDER — PROMETHAZINE HCL 25 MG/ML IJ SOLN
25.0000 mg | Freq: Once | INTRAMUSCULAR | Status: AC
  Administered 2012-09-26: 25 mg via INTRAMUSCULAR

## 2012-09-26 MED ORDER — DEXTROSE 5 % IV SOLN: 1.0000 g | INTRAVENOUS | Status: DC

## 2012-09-26 MED ORDER — CEFTRIAXONE SODIUM 1 G IJ SOLR
1.0000 g | Freq: Once | INTRAMUSCULAR | Status: AC
  Administered 2012-09-26: 1 g via INTRAMUSCULAR

## 2012-09-26 NOTE — Telephone Encounter (Signed)
Pt is asking to speak to Dr Louanne Belton today

## 2012-09-26 NOTE — Addendum Note (Signed)
Addended by: Farrell Ours on: 09/26/2012 04:27 PM   Modules accepted: Orders

## 2012-09-26 NOTE — Telephone Encounter (Signed)
Fatigue may last for a week or more.  If she isn't doing better by a week from now, she should come in for further evaluation.

## 2012-09-26 NOTE — Telephone Encounter (Signed)
Patient is asking for Dr. Louanne Belton even though he isn't her doctor.  She canceled her appt for today, but she has concerns about how long the fatigue is going to last and she is itching a lot.

## 2012-09-26 NOTE — Telephone Encounter (Signed)
Pt is feeling better and is not coming today - is asking for her abx to be sent to [facility]

## 2012-09-26 NOTE — Telephone Encounter (Signed)
Done.  [name] she's feeling better.

## 2012-09-27 LAB — URINE CULTURE

## 2012-09-27 NOTE — Telephone Encounter (Signed)
Spoke with pateint and gave her below message. patient states that she is sleeping more than usual and that she has thrown up even with taking the nausea pill and suppository. She has not been able to eat. She also wants to know if she should be taking the water pill and how much often she should be taking it.

## 2012-09-27 NOTE — Telephone Encounter (Signed)
Spoke with patient and Dr. Louanne Belton about this and she is coming in Friday to see Dr. Cristal Ford and also Dr. Arlyce Dice

## 2012-09-27 NOTE — Telephone Encounter (Signed)
She can double up on the suppository.  If what we gave her in the office worked, that should be a similar dose.  As I said in her AVS, she should NOT be taking the torsemide.  I would also recommend that she get in touch with her GI physician, Dr. Arlyce Dice, and see if she can get in any earlier.  I would appreciate his input on what to do to help her.

## 2012-09-29 ENCOUNTER — Ambulatory Visit (INDEPENDENT_AMBULATORY_CARE_PROVIDER_SITE_OTHER): Payer: Medicare Other | Admitting: Gastroenterology

## 2012-09-29 ENCOUNTER — Inpatient Hospital Stay (HOSPITAL_COMMUNITY)
Admission: AD | Admit: 2012-09-29 | Discharge: 2012-10-02 | DRG: 392 | Disposition: A | Discharge status: Home / Self Care | Payer: Medicare Other | Source: Ambulatory Visit | Attending: Family Medicine | Admitting: Family Medicine

## 2012-09-29 ENCOUNTER — Encounter (HOSPITAL_COMMUNITY): Payer: Self-pay | Admitting: General Practice

## 2012-09-29 ENCOUNTER — Ambulatory Visit: Payer: Medicare Other | Admitting: Family Medicine

## 2012-09-29 ENCOUNTER — Inpatient Hospital Stay (HOSPITAL_COMMUNITY): Payer: Medicare Other

## 2012-09-29 ENCOUNTER — Encounter: Payer: Self-pay | Admitting: Gastroenterology

## 2012-09-29 ENCOUNTER — Telehealth: Payer: Self-pay | Admitting: Gastroenterology

## 2012-09-29 VITALS — BP 132/60 | HR 108 | Ht 63.25 in | Wt 153.1 lb

## 2012-09-29 DIAGNOSIS — R112 Nausea with vomiting, unspecified: Secondary | ICD-10-CM | POA: Diagnosis present

## 2012-09-29 DIAGNOSIS — E871 Hypo-osmolality and hyponatremia: Secondary | ICD-10-CM | POA: Diagnosis present

## 2012-09-29 DIAGNOSIS — K8309 Other cholangitis: Secondary | ICD-10-CM | POA: Diagnosis present

## 2012-09-29 DIAGNOSIS — Z79899 Other long term (current) drug therapy: Secondary | ICD-10-CM

## 2012-09-29 DIAGNOSIS — K7460 Unspecified cirrhosis of liver: Secondary | ICD-10-CM | POA: Diagnosis present

## 2012-09-29 DIAGNOSIS — I8510 Secondary esophageal varices without bleeding: Secondary | ICD-10-CM | POA: Diagnosis present

## 2012-09-29 DIAGNOSIS — R197 Diarrhea, unspecified: Secondary | ICD-10-CM | POA: Diagnosis not present

## 2012-09-29 DIAGNOSIS — D649 Anemia, unspecified: Secondary | ICD-10-CM | POA: Diagnosis present

## 2012-09-29 DIAGNOSIS — K5900 Constipation, unspecified: Secondary | ICD-10-CM | POA: Diagnosis present

## 2012-09-29 DIAGNOSIS — K754 Autoimmune hepatitis: Secondary | ICD-10-CM | POA: Diagnosis present

## 2012-09-29 DIAGNOSIS — R1115 Cyclical vomiting syndrome unrelated to migraine: Principal | ICD-10-CM | POA: Diagnosis present

## 2012-09-29 HISTORY — DX: Primary sclerosing cholangitis: K83.01

## 2012-09-29 LAB — URINALYSIS, ROUTINE W REFLEX MICROSCOPIC
Nitrite: POSITIVE — AB
Specific Gravity, Urine: 1.021 (ref 1.005–1.030)
Urobilinogen, UA: >8 mg/dL — ABNORMAL HIGH (ref 0.0–1.0)
pH: 6.5 (ref 5.0–8.0)

## 2012-09-29 LAB — COMPREHENSIVE METABOLIC PANEL
ALT: 63 U/L — ABNORMAL HIGH (ref 0–35)
AST: 139 U/L — ABNORMAL HIGH (ref 0–37)
Alkaline Phosphatase: 789 U/L — ABNORMAL HIGH (ref 39–117)
CO2: 24 mEq/L (ref 19–32)
Calcium: 8.3 mg/dL — ABNORMAL LOW (ref 8.4–10.5)
Chloride: 97 mEq/L (ref 96–112)
GFR calc Af Amer: >90 mL/min (ref >90)
GFR calc non Af Amer: >90 mL/min (ref >90)
Glucose, Bld: 93 mg/dL (ref 70–99)
Potassium: 4 mEq/L (ref 3.5–5.1)
Sodium: 128 mEq/L — ABNORMAL LOW (ref 135–145)

## 2012-09-29 LAB — LIPASE, BLOOD: Lipase: 20 U/L (ref 11–59)

## 2012-09-29 LAB — CBC
Hemoglobin: 11.2 g/dL — ABNORMAL LOW (ref 12.0–15.0)
MCH: 36.5 pg — ABNORMAL HIGH (ref 26.0–34.0)
MCHC: 36.2 g/dL — ABNORMAL HIGH (ref 30.0–36.0)
RDW: 14.3 % (ref 11.5–15.5)

## 2012-09-29 LAB — URINE MICROSCOPIC-ADD ON

## 2012-09-29 LAB — PREGNANCY, URINE: Preg Test, Ur: NEGATIVE

## 2012-09-29 LAB — PROTIME-INR: Prothrombin Time: 18.2 seconds — ABNORMAL HIGH (ref 11.6–15.2)

## 2012-09-29 MED ORDER — LEVETIRACETAM 750 MG PO TABS
750.0000 mg | ORAL_TABLET | Freq: Every day | ORAL | Status: DC
  Administered 2012-09-29 – 2012-10-02 (×4): 750 mg via ORAL
  Filled 2012-09-29 (×7): qty 1

## 2012-09-29 MED ORDER — HEPARIN SODIUM (PORCINE) 5000 UNIT/ML IJ SOLN
5000.0000 [IU] | Freq: Three times a day (TID) | INTRAMUSCULAR | Status: DC
  Administered 2012-09-29 – 2012-09-30 (×3): 5000 [IU] via SUBCUTANEOUS
  Filled 2012-09-29 (×6): qty 1

## 2012-09-29 MED ORDER — ONDANSETRON 8 MG/NS 50 ML IVPB
8.0000 mg | Freq: Three times a day (TID) | INTRAVENOUS | Status: DC | PRN
  Administered 2012-09-29: 8 mg via INTRAVENOUS
  Filled 2012-09-29: qty 8

## 2012-09-29 MED ORDER — PANTOPRAZOLE SODIUM 40 MG IV SOLR
40.0000 mg | INTRAVENOUS | Status: DC
  Administered 2012-09-29: 40 mg via INTRAVENOUS
  Filled 2012-09-29 (×2): qty 40

## 2012-09-29 MED ORDER — DEXTROSE 5 % IV SOLN
1.0000 g | INTRAVENOUS | Status: DC
  Administered 2012-09-29 – 2012-10-01 (×3): 1 g via INTRAVENOUS
  Filled 2012-09-29 (×4): qty 10

## 2012-09-29 MED ORDER — SODIUM CHLORIDE 0.9 % IV BOLUS (SEPSIS)
1000.0000 mL | Freq: Once | INTRAVENOUS | Status: AC
  Administered 2012-09-29: 1000 mL via INTRAVENOUS

## 2012-09-29 MED ORDER — PROMETHAZINE HCL 25 MG/ML IJ SOLN: 12.5000 mg | Freq: Three times a day (TID) | INTRAMUSCULAR | Status: DC | PRN

## 2012-09-29 MED ORDER — AZATHIOPRINE 50 MG PO TABS
50.0000 mg | ORAL_TABLET | Freq: Every day | ORAL | Status: DC
  Administered 2012-09-29 – 2012-10-02 (×4): 50 mg via ORAL
  Filled 2012-09-29 (×4): qty 1

## 2012-09-29 MED ORDER — METOCLOPRAMIDE HCL 10 MG PO TABS
10.0000 mg | ORAL_TABLET | Freq: Four times a day (QID) | ORAL | Status: DC
  Administered 2012-09-29 – 2012-10-02 (×13): 10 mg via ORAL
  Filled 2012-09-29 (×16): qty 1

## 2012-09-29 MED ORDER — ALPRAZOLAM 0.5 MG PO TABS
0.5000 mg | ORAL_TABLET | Freq: Every evening | ORAL | Status: DC | PRN
  Administered 2012-09-29 – 2012-10-01 (×3): 0.5 mg via ORAL
  Filled 2012-09-29 (×3): qty 1

## 2012-09-29 MED ORDER — POLYETHYLENE GLYCOL 3350 17 G PO PACK
8.5000 g | PACK | Freq: Every day | ORAL | Status: DC
  Administered 2012-09-29 – 2012-09-30 (×2): 8.5 g via ORAL
  Filled 2012-09-29 (×4): qty 1

## 2012-09-29 MED ORDER — HYDROCODONE-ACETAMINOPHEN 5-325 MG PO TABS
1.0000 | ORAL_TABLET | ORAL | Status: DC | PRN
  Administered 2012-09-29 – 2012-10-02 (×7): 2 via ORAL
  Filled 2012-09-29 (×7): qty 2

## 2012-09-29 NOTE — H&P (Signed)
[name] is an 27 y.o. female.   Chief Complaint: nausea and emesis, epigastric pain HPI:  27 yo F with known history of autoimmune hepatitis and gastric varices presents with one month of persistent nausea associated with nightly emesis. She describes emesis as small volume, green-dark brown, bloody last night, 5-10 episodes nightly. Associated with nausea and emesis is epigastric pain that is severe, achy and burning. The pain occasionally radiates to the R>L flank. She is tolerating clear liquids. She admits to constipation.  She denies urinary symptoms including dysuria, frequency, urgency. She also denies fever, sick contacts, recent travel, eating at unusual restaurants/food stores. She denies bruising.   She was seen at the family medicine center 5 days ago for symptoms and was treated with IV fluids and IV rocephin for possible UTI.   She was seen by her GI doctor this AM and sent over the hospital for work-up for her persistent symptoms.   Past Medical History  Diagnosis Date  . Substance abuse   . Esophageal varices   . Cirrhosis of liver   . Autoimmune hepatitis     Past Surgical History  Procedure Laterality Date  . Esophagogastroduodenoscopy  07/18/2012    Procedure: ESOPHAGOGASTRODUODENOSCOPY (EGD);  Surgeon: Louis Meckel, MD;  Location: [facility];  Service: Endoscopy;  Laterality: N/A;  . Gastric varices banding  07/18/2012    Procedure: GASTRIC VARICES BANDING;  Surgeon: Louis Meckel, MD;  Location: [facility];  Service: Endoscopy;  Laterality: N/A;    Family History  Problem Relation Age of Onset  . Cancer Neg Hx    Social History:  reports that she quit smoking about 7 years ago. Her smoking use included Cigarettes. She smoked 0.00 packs per day. She has never used smokeless tobacco. She reports that she uses illicit drugs. She reports that she does not drink alcohol.  Allergies:  Allergies  Allergen Reactions  . Ibuprofen Shortness Of Breath     Medications Prior to Admission  Medication Sig Dispense Refill  . ALPRAZolam (XANAX) 0.5 MG tablet Take 1-2 tablets (0.5-1 mg total) by mouth at bedtime as needed. Take 1-2 tablets at bedtime as needed for sleep  15 tablet  0  . azaTHIOprine (IMURAN) 50 MG tablet Take 50 mg by mouth daily.       [name] CARAFATE 1 GM/10ML suspension Take 1 g by mouth as needed.       [name] EPINEPHrine (EPI-PEN) 0.3 mg/0.3 mL DEVI Inject 0.3 mg into the muscle once.      [name] HYDROcodone-acetaminophen (NORCO) 5-325 MG per tablet Take 1 tablet by mouth every 6 (six) hours as needed for pain.  30 tablet  0  . Lactulose 20 GM/30ML SOLN Take 30 mLs (20 g total) by mouth daily as needed. For constipation  500 mL  0  . levETIRAcetam (KEPPRA) 750 MG tablet Take 750 mg by mouth daily.       . metoCLOPramide (REGLAN) 10 MG tablet Take 10 mg by mouth 4 (four) times daily.      . ondansetron (ZOFRAN-ODT) 4 MG disintegrating tablet Take 4 mg by mouth every 6 (six) hours as needed. For nausea      . pantoprazole (PROTONIX) 40 MG tablet Take 1 tablet (40 mg total) by mouth daily.  30 tablet  3  . potassium chloride SA (K-DUR,KLOR-CON) 20 MEQ tablet Take 1 tablet (20 mEq total) by mouth 2 (two) times daily.  60 tablet  0  . promethazine (PHENERGAN) 25 MG suppository  Place 25 mg rectally every 6 (six) hours as needed. For nausea      . spironolactone (ALDACTONE) 100 MG tablet Take 2 tablets (200 mg total) by mouth daily.  60 tablet  0  . torsemide (DEMADEX) 20 MG tablet Take 2 tablets (40 mg total) by mouth 4 (four) times daily.  120 tablet  0    No results found for this or any previous visit (from the past 48 hour(s)). No results found.  ROS As per HPI No period for > 1 year  Blood pressure 139/66, pulse 91, temperature 98.3 F (36.8 C), temperature source Oral, resp. rate 18, SpO2 100.00%. Physical Exam  General appearance: alert, cooperative, no distress and fatigued Eyes: PERRLA, sclera icterus, EOMI Throat: lips, mucosa,  and tongue normal; teeth and gums normal Lungs: clear to auscultation bilaterally Heart: regular rate and rhythm, S1, S2 normal, no murmur, click, rub or gallop Abdomen: NABS,  Soft, TTP epigastric and bilateral flank, otherwise non tender, no masses, rebound or guarding.  Extremities: extremities normal, atraumatic, no cyanosis or edema Pulses: 2+ and symmetric Skin: Skin color, texture, turgor normal. No rashes or lesions Neurologic: Alert and oriented X 3, normal strength and tone. Normal coordination and gait. No asterixis.   Assessment/Plan 28 yo F with known autoimmune hepatitis and esophageal varices presents with persistent nausea and emesis.   1. Emesis:  A: last episode last night. Possible gastritis vs gastric ulcer vs pancreatitis. Etiology not particularly clear. She is without fever to suggest acute inflammatory process. I spoke to her GI doctor, Dr. Arlyce Dice, who will be happy to see her if needed.  P: Initial work-up with CMP, CBC, lipase, ammonia, keppra level, UA/U Micro, U preg.  ABXR series to eval stool burden.   Pain control with Vicodin. IV PPI once daily. Continue Reglan.  NS bolus x one.  Clear liquid diet.  F/u labs to guide additional fluid.   2. 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   3. Code: Full  4. Diet: clears.  5. DVT PPx: Heparin.  Dispo: pending work up and clinical improvement.    [name] 09/29/2012, 12:40 PM

## 2012-09-29 NOTE — Progress Notes (Signed)
History of Present Illness:  The patient returns for evaluation of burning epigastric discomfort and nausea and vomiting. She was seen 4 days ago at family practice for similar problems, given IV hydration and told to call back if she's not better, at which point she would be admitted. Symptoms continue. She is without fever. She was given IV Rocephin for a possible UTI.   Review of Systems: Pertinent positive and negative review of systems were noted in the above HPI section. All other review of systems were otherwise negative.    Current Medications, Allergies, Past Medical History, Past Surgical History, Family History and Social History were reviewed in [employer] [facility] electronic medical record  Vital signs were reviewed in today's medical record. Physical Exam: General: Ill-appearing Skin: Scleral icterus Head: Normocephalic and atraumatic Eyes:  sclerae anicteric, EOMI Ears: Normal auditory acuity Mouth: No deformity or lesions; dry mucus membranes Lungs: Clear throughout to auscultation Heart: Regular rate and rhythm; no murmurs, rubs or bruits Abdomen: Soft, non tender and non distended. No masses, hepatosplenomegaly or hernias noted. Normal Bowel sounds Rectal:deferred Musculoskeletal: Symmetrical with no gross deformities  Pulses:  Normal pulses noted Extremities: No clubbing, cyanosis,  or deformities noted. There is trace ankle edema Neurological: Alert oriented x 4, grossly nonfocal Psychological:  Alert and cooperative. Normal mood and affect

## 2012-09-29 NOTE — H&P (Signed)
FMTS Attending Admission Note: Renold Don MD Personal pager:  [phone] FPTS Service Pager:  [phone]  I  have seen and examined this patient, reviewed their chart. I have discussed this patient with the resident. I agree with the resident's findings, assessment and care plan.  27 yo F with known cirrhosis secondary to autoimmune hepatitis and PSC with history of esophageal varices who presents with several day history of nausea and vomiting.  Patient seen in clinic 2/11 with symptoms or urinary frequency, abdominal pain, and back pain.  Diagnosed as fluid overloaded and had diuretics increased from Lasix to Torsemide.  UA at that time contaminated with epithelial cells.  Presented to clinic again on 2/17 with weight loss (about 30 lbs based on clinic scales), nausea, and vomiting.  Repeat UA at that time again contaminated with epithelial cells.  Diagnosed as dehydration secondary to over-diuresis, provided 1 L NS bolus, IM Phenergan/Zofran and Ceftriaxone.  Not prescribed any antibiotics by mouth and discharged from clinic with instructions for close FU next day.  Presented to GI today (5 days later) with persistent nausea/vomiting, abdominal pain, and now with flank pain Right>Left.  Denies any fevers/chills.  Unable to tolerate anything by PO since that time.  Describes vomitus as green, 3-5 times a day, then 1 episode of coffee ground emesis early this AM.  Due to persistent symptoms she directly admitted to our service from outpatient GI office.    Currently, she is no longer having any abdominal pain.  No vomiting since she presented here several hours ago.  She is complaining about not having any thing to eat and states she feels hungry.    Exam:   Gen:  AAF lying in bed, no acute distress.  She falls asleep during our conversation multiple times HEENT:  Sclera icteric.  EOMI, PERRL.  MMM Neck:  No JVD noted Heart:  RRR with Grade III/VI SEM noted throughout Lungs:  No crackles  noted Abdomen:  Soft/ND/NT.  Good BS throughout.  No abdominal wall edema Ext:  No edema noted BL  Imp/Plan: 1.  Nausea/vomiting: - This is the first UA we've had in a while that doesn't show epithelial contamination.  Good evidence for UTI.  Likely culprit.  Will send for culture. Start Ceftriaxone until culture returns - Agree with PPI, reglan - Switch to hydrocodone to limit tylenol exposure - Discussed advancing to full liquids -- patient became very argumentative, cursing several times, about not being allowed to eat.  As no longer any abdominal pain, nausea/vomiting will attempt soft diet with ability to advance as tolerated.   - Saline lock fluids as she is drinking plenty currently.   - Hgb stable -- if stops will decrease diet and advance PPI to more aggressive therapy.   2.  Liver: - Overwhelming issue for patient.   - Per Transplant notes, prior substance abuse has limited her ability to be placed on transplant list.  - As above, hydrocodone rather than vicodin for pain - INR rising above baseline.  Agree with ammonia as she was sleepy for me during examination.   - Per [facility] notes, she should not be placed on Torsemide.  This should be carried through to the DC summary (unsure why, unless it has to do with over-diuresis).

## 2012-09-29 NOTE — Telephone Encounter (Signed)
Spoke with pt and she states that she has been having a lot of nausea and vomiting green liquid. States that nothing she has done has helped. Pt reports that she has bad abdominal pain that goes through to her back. Calling to let us know that if Dr. Arlyce Dice thinks putting her in the hospital will help she is willing to go, she feels that bad. Pt coming to appt today.

## 2012-09-29 NOTE — Assessment & Plan Note (Addendum)
Patient has continued signs and symptoms of dehydration with nausea and vomiting. She also has jaundice. Symptoms could be an intercurrent gastroenteritis.   Recommendations #1 arrange for admission for further evaluation and therapy

## 2012-09-30 DIAGNOSIS — R1032 Left lower quadrant pain: Secondary | ICD-10-CM

## 2012-09-30 DIAGNOSIS — I8500 Esophageal varices without bleeding: Secondary | ICD-10-CM

## 2012-09-30 DIAGNOSIS — K8309 Other cholangitis: Secondary | ICD-10-CM

## 2012-09-30 DIAGNOSIS — K754 Autoimmune hepatitis: Secondary | ICD-10-CM

## 2012-09-30 DIAGNOSIS — R112 Nausea with vomiting, unspecified: Secondary | ICD-10-CM

## 2012-09-30 LAB — COMPREHENSIVE METABOLIC PANEL
Alkaline Phosphatase: 651 U/L — ABNORMAL HIGH (ref 39–117)
BUN: 7 mg/dL (ref 6–23)
CO2: 23 mEq/L (ref 19–32)
GFR calc Af Amer: >90 mL/min (ref >90)
GFR calc non Af Amer: >90 mL/min (ref >90)
Glucose, Bld: 163 mg/dL — ABNORMAL HIGH (ref 70–99)
Potassium: 3.5 mEq/L (ref 3.5–5.1)
Total Bilirubin: 15.4 mg/dL — ABNORMAL HIGH (ref 0.3–1.2)
Total Protein: 5.1 g/dL — ABNORMAL LOW (ref 6.0–8.3)

## 2012-09-30 LAB — CBC
HCT: 26.7 % — ABNORMAL LOW (ref 36.0–46.0)
MCHC: 36.3 g/dL — ABNORMAL HIGH (ref 30.0–36.0)
Platelets: 46 10*3/uL — ABNORMAL LOW (ref 150–400)
RDW: 14.2 % (ref 11.5–15.5)
WBC: 3.4 10*3/uL — ABNORMAL LOW (ref 4.0–10.5)

## 2012-09-30 LAB — URINE CULTURE: Colony Count: NO GROWTH

## 2012-09-30 LAB — AMMONIA: Ammonia: 60 umol/L (ref 11–60)

## 2012-09-30 MED ORDER — PANTOPRAZOLE SODIUM 40 MG PO TBEC
40.0000 mg | DELAYED_RELEASE_TABLET | Freq: Every day | ORAL | Status: DC
  Administered 2012-09-30 – 2012-10-02 (×3): 40 mg via ORAL
  Filled 2012-09-30 (×3): qty 1

## 2012-09-30 MED ORDER — SODIUM CHLORIDE 0.9 % IV BOLUS (SEPSIS)
500.0000 mL | Freq: Once | INTRAVENOUS | Status: AC
  Administered 2012-09-30: 500 mL via INTRAVENOUS

## 2012-09-30 MED ORDER — LACTULOSE 10 GM/15ML PO SOLN
20.0000 g | Freq: Two times a day (BID) | ORAL | Status: DC
  Administered 2012-09-30 (×2): 20 g via ORAL
  Filled 2012-09-30 (×7): qty 30

## 2012-09-30 MED ORDER — HYDROXYZINE HCL 25 MG PO TABS
25.0000 mg | ORAL_TABLET | Freq: Three times a day (TID) | ORAL | Status: DC | PRN
  Administered 2012-09-30 – 2012-10-01 (×4): 25 mg via ORAL
  Filled 2012-09-30 (×4): qty 1

## 2012-09-30 MED ORDER — SODIUM CHLORIDE 0.9 % IV SOLN: INTRAVENOUS | Status: DC

## 2012-09-30 NOTE — Progress Notes (Signed)
Family Medicine Teaching Service Daily Progress Note Service Pager: [phone]  Subjective: Pt complains of feeling tired, wants to know why she is so tired.  Objective: Vital signs in last 24 hours: Filed Vitals:   09/29/12 1432 09/29/12 1800 09/29/12 2238 09/30/12 0536  BP: 134/70 133/64 110/48 124/58  Pulse: 88 102 110 114  Temp: 98.2 F (36.8 C) 98 F (36.7 C) 98.8 F (37.1 C) 98.6 F (37 C)  TempSrc: Oral Oral Oral Oral  Resp: 16 18 18 16   Height:      Weight:      SpO2: 100% 100% 100% 95%   Weight change:   Intake/Output Summary (Last 24 hours) at 09/30/12 0900 Last data filed at 09/30/12 0500  Gross per 24 hour  Intake   2320 ml  Output    550 ml  Net   1770 ml   General appearance: chronically ill and slightly drowsy Eyes: Scleral icterus Neck: no adenopathy, no JVD and supple, symmetrical, trachea midline Lungs: clear to auscultation bilaterally Heart: 3/6 systolic murmur Abdomen: Periumbilical TTP, soft, + BS Extremities: extremities normal, atraumatic, no cyanosis or edema Pulses: 2+ and symmetric Lab Results:  Recent Labs  09/29/12 1210  WBC 4.4  HGB 11.2*  HCT 30.9*  PLT 33*    Recent Labs  09/29/12 1210  NA 128*  K 4.0  CL 97  CO2 24  GLUCOSE 93  BUN 11  CREATININE 0.60  CALCIUM 8.3*   Lab Results  Component Value Date   ALT 63* 09/29/2012   AST 139* 09/29/2012   ALKPHOS 789* 09/29/2012   BILITOT 15.0* 09/29/2012   Lab Results  Component Value Date   LIPASE 20 09/29/2012   Ammonia: 56 Urine Pregnancy: Negative  Micro Results: Recent Results (from the past 240 hour(s))  URINE CULTURE     Status: None   Collection Time    09/25/12  5:00 PM      Result Value Range Status   Colony Count >=100,000 COLONIES/ML   Final   Organism ID, Bacteria Multiple bacterial morphotypes present, none   Final   Organism ID, Bacteria predominant. Suggest appropriate recollection if    Final   Organism ID, Bacteria clinically indicated.   Final    Studies/Results:  Medications: I have reviewed the patient's current medications. Scheduled Meds: . azaTHIOprine  50 mg Oral Daily  . cefTRIAXone (ROCEPHIN)  IV  1 g Intravenous Q24H  . heparin  5,000 Units Subcutaneous Q8H  . lactulose  20 g Oral BID  . levETIRAcetam  750 mg Oral Daily  . metoCLOPramide  10 mg Oral QID  . pantoprazole (PROTONIX) IV  40 mg Intravenous Q24H  . polyethylene glycol  8.5 g Oral Daily  . sodium chloride  500 mL Intravenous Once   Continuous Infusions: . sodium chloride     PRN Meds:.ALPRAZolam, HYDROcodone-acetaminophen, hydrOXYzine, ondansetron (ZOFRAN) IV, promethazine  Assessment/Plan  27 yo F with known autoimmune hepatitis and esophageal varices direct admitted from GI clinic with persistent nausea and emesis, dehydration:   1. Emesis and dehydration: A: Most likely UTI vs gastritis vs gastric ulcer, patient with some tachycardia indicating still not euvolemic P:  -Pt had recent UA that was likely contaminated by epithelial cells therefore culture grew multiple bacterial morphotypes and not useful.  However, UA done yesterday was good specimen (few epi cells) and concerning for UTI- Urine culture pending.  Will continue Ceftriaxone for treatment until urine culture back. She is afebrile with normal WBC but on  immunosuppressive therapy, may not mount immune response.   -Patient hyponatremic with sodium 128, this appears to be acute on chronic, will hydrate with NS @ 150/hr -Lipase and ammonia normal, LFT's are elevated but stable - ABXR unremarkable, consider renal US to eval for pyelonephritis  Pain control with Vicodin, and nausea control with antiemetics and reglan IV PPI once daily.  Clear liquid diet.   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 Code: Full  4. FEN/GI: NS @ 150, clear diet as tolerated 5. DVT PPx: Heparin.  Dispo: pending clinical improvement, tolerating  PO   [name] 09/30/2012, 9:00 AM

## 2012-09-30 NOTE — Progress Notes (Addendum)
FMTS Attending Daily Note:  Renold Don MD  [phone] pager  Family Practice pager:  [phone] I have seen and examined this patient and have reviewed their chart. I have discussed this patient with the resident. I agree with the resident's findings, assessment and care plan.  Additionally: - Patient more fatigued/drowsy today.  Nausea/vomiting have resolved.  Some flank pain, no abdominal pain.   - Flank pain plus nitrite/leuks/blood on UA:  Treating as UTI.  Repeating CMET, CBC, ammonia today.  Renal US for any evidence of pyelo.  Adding differential to CBC tomorrow AM.  - Leukopenia:  Unclear if she can mount adequate response to infection, so we are being cautious.  This plus tachycardia qualifies her as SIRS.  Low threshold for broadening antibiotics coverage - awaiting urine culture today.  She has not had any urinary instrumentation per her own report nor medical record for past 3 months, so this should be adequate coverage.  - Thrombocytopenia:  Known for patient -- however lower than baseline of 50 - 70 for past several months.  She is currently on heparin.  Stopping this, adding SCDs for coverage.   - Anemia:  Likely dilutional.  She is receiving fluids again after a bolus yesterday evening.  We need to be careful how much we do this.  I believe tachycardia relates more to infection than dehydration as mucus membranes dry.  Known deficiency of portal venous system, loud murmur, she is easily volume overloaded so will decrease IVF to [facility].  She is drinking adequately and has multiple cans of ginger ale and water by her bed.

## 2012-10-01 LAB — CBC WITH DIFFERENTIAL/PLATELET
Basophils Absolute: 0 10*3/uL (ref 0.0–0.1)
Basophils Relative: 1 % (ref 0–1)
Eosinophils Relative: 3 % (ref 0–5)
HCT: 26.4 % — ABNORMAL LOW (ref 36.0–46.0)
Hemoglobin: 9.4 g/dL — ABNORMAL LOW (ref 12.0–15.0)
Lymphocytes Relative: 13 % (ref 12–46)
MCHC: 35.6 g/dL (ref 30.0–36.0)
MCV: 101.5 fL — ABNORMAL HIGH (ref 78.0–100.0)
Monocytes Absolute: 0.5 10*3/uL (ref 0.1–1.0)
Monocytes Relative: 12 % (ref 3–12)
Neutro Abs: 2.9 10*3/uL (ref 1.7–7.7)
RDW: 14.4 % (ref 11.5–15.5)

## 2012-10-01 LAB — BASIC METABOLIC PANEL
BUN: 7 mg/dL (ref 6–23)
CO2: 25 mEq/L (ref 19–32)
GFR calc non Af Amer: >90 mL/min (ref >90)
Glucose, Bld: 133 mg/dL — ABNORMAL HIGH (ref 70–99)
Potassium: 3.7 mEq/L (ref 3.5–5.1)
Sodium: 127 mEq/L — ABNORMAL LOW (ref 135–145)

## 2012-10-01 LAB — COMPREHENSIVE METABOLIC PANEL
ALT: 104 U/L — ABNORMAL HIGH (ref 0–35)
Albumin: 1.6 g/dL — ABNORMAL LOW (ref 3.5–5.2)
Alkaline Phosphatase: 654 U/L — ABNORMAL HIGH (ref 39–117)
Calcium: 7.4 mg/dL — ABNORMAL LOW (ref 8.4–10.5)
Potassium: 3.5 mEq/L (ref 3.5–5.1)
Sodium: 124 mEq/L — ABNORMAL LOW (ref 135–145)
Total Protein: 5.1 g/dL — ABNORMAL LOW (ref 6.0–8.3)

## 2012-10-01 MED ORDER — SODIUM CHLORIDE 1 G PO TABS
1.0000 g | ORAL_TABLET | ORAL | Status: AC
  Administered 2012-10-01: 1 g via ORAL
  Filled 2012-10-01: qty 1

## 2012-10-01 MED ORDER — SODIUM CHLORIDE 1 G PO TABS
1.0000 g | ORAL_TABLET | Freq: Three times a day (TID) | ORAL | Status: DC
  Administered 2012-10-01 – 2012-10-02 (×4): 1 g via ORAL
  Filled 2012-10-01 (×10): qty 1

## 2012-10-01 MED ORDER — SPIRONOLACTONE 100 MG PO TABS
200.0000 mg | ORAL_TABLET | Freq: Every day | ORAL | Status: DC
  Administered 2012-10-01 – 2012-10-02 (×2): 200 mg via ORAL
  Filled 2012-10-01 (×2): qty 2

## 2012-10-01 MED ORDER — FUROSEMIDE 40 MG PO TABS
40.0000 mg | ORAL_TABLET | Freq: Every day | ORAL | Status: DC
  Administered 2012-10-01 – 2012-10-02 (×2): 40 mg via ORAL
  Filled 2012-10-01 (×2): qty 1

## 2012-10-01 MED ORDER — ONDANSETRON HCL 4 MG PO TABS: 8.0000 mg | ORAL_TABLET | Freq: Three times a day (TID) | ORAL | Status: DC | PRN

## 2012-10-01 NOTE — Progress Notes (Addendum)
FMTS Attending Daily Note:  Renold Don MD  [phone] pager  Family Practice pager:  [phone] I have seen and examined this patient and have reviewed their chart. I have discussed this patient with the resident. I agree with the resident's findings, assessment and care plan.  Additionally: Overall she has improved, but still fatigued and now with dropping Na.  No further abdominal pain, no nausea or vomiting.  Eating and drinking well.    Exam: Gen:  Icteric/jaundiced appearing Heart:  Grade III murmur, RRR Lungs:  Clear Abdomen:  Nontender.  Increased girth with some abdominal wall edema since admit.   Ext:  +2 edema BL ankles  1.  Hyponatremia:    - believe hyponatremia is likely secondary to volume overload -- she is off all of her diuretics and has received several fluid boluses and is also now demonstrating peripheral/abdominal edema. - Will restart home medications and obtain serum osmolality, expect improvement in Na after restarting home meds.   - OF NOTE:  Last set of records we have from [facility] dated April 2013 states that she should NOT be put on Torsemide. Her rapid fluid loss and subsequent dehydration week prior to admission corroborates this.  This should be added to her outpt chart.  I have gone through and updated the rest of her medical history based on [facility] records.    2.  UTI: - Urine culture negative, which is surprising.   - Recommend renal US due to persistent hematuria based on multiple UAs in past 6 months and history of negative cultures.  3.  Diarrhea: - new problem for past 2 days increasing in nature. - she is on Lactulose, but states this is different from her usual soft/runny stools with Lactulose. -  She has multiple recent admits, WBC only minimally improved.  Will obtain C dif PCR to rule this out.   - Again, concerned over her likely lack of ability to mount a significant immune response.   4.  PSC: - LFTs continue upward trend, but this still within  usual limits for her based on review of past 6 month labs - mother in room today, states she has looked progressively more jaundiced for past 2 weeks - if persists, may need her GI Dr. Arlyce Dice to come by and evaluate her - I did have long discussion with her today about how much of what we are doing is trying to keep her stable in hopes of obtaining liver transplant and that this is definitive plan of treatment - Evidently she has been put back on waiting list just to be seen down at [facility] -- we will need to track down whether we can move her up that list.    4. Disposition:   - My hope had been to DC her home today as she had demonstrated improvement in regards to PO intake and lack of N/V; however she will now stay one more night.

## 2012-10-01 NOTE — Progress Notes (Signed)
Family Medicine Teaching Service Daily Progress Note Service Pager: [phone]  Subjective: Pt is eating eggs and bacon, denies any nausea.  She still endorses fatigue, but does says she would like to go home today if possible. Objective: Vital signs in last 24 hours: Filed Vitals:   09/30/12 0536 09/30/12 1516 09/30/12 2046 10/01/12 0554  BP: 124/58 138/62 118/60 116/59  Pulse: 114 113 110 97  Temp: 98.6 F (37 C) 99 F (37.2 C) 98.6 F (37 C) 98.1 F (36.7 C)  TempSrc: Oral Oral Oral Oral  Resp: 16 16 18 16   Height:      Weight:      SpO2: 95% 97% 100% 99%   Weight change:  No intake or output data in the 24 hours ending 10/01/12 0720 General appearance: chronically ill , sitting up eating, alert and awake. Eyes: Scleral icterus Neck: no adenopathy, no JVD and supple, symmetrical, trachea midline Lungs: clear to auscultation bilaterally Heart: 3/6 systolic murmur Abdomen: +BS, NT/ND Extremities: extremities normal, atraumatic, no cyanosis or edema Pulses: 2+ and symmetric Lab Results:  Recent Labs  09/29/12 1210 09/30/12 1154  WBC 4.4 3.4*  HGB 11.2* 9.7*  HCT 30.9* 26.7*  PLT 33* 46*    Recent Labs  09/29/12 1210 09/30/12 1154  NA 128* 127*  K 4.0 3.5  CL 97 98  CO2 24 23  GLUCOSE 93 163*  BUN 11 7  CREATININE 0.60 0.62  CALCIUM 8.3* 7.8*   Lab Results  Component Value Date   ALT 74* 09/30/2012   AST 194* 09/30/2012   ALKPHOS 651* 09/30/2012   BILITOT 15.4* 09/30/2012   Lab Results  Component Value Date   LIPASE 20 09/29/2012   Ammonia: 56 Urine Pregnancy: Negative  Micro Results: Recent Results (from the past 240 hour(s))  URINE CULTURE     Status: None   Collection Time    09/25/12  5:00 PM      Result Value Range Status   Colony Count >=100,000 COLONIES/ML   Final   Organism ID, Bacteria Multiple bacterial morphotypes present, none   Final   Organism ID, Bacteria predominant. Suggest appropriate recollection if    Final   Organism ID,  Bacteria clinically indicated.   Final  URINE CULTURE     Status: None   Collection Time    09/29/12  5:41 PM      Result Value Range Status   Specimen Description URINE, CLEAN CATCH   Final   Special Requests Normal   Final   Culture  Setup Time 09/29/2012 18:34   Final   Colony Count NO GROWTH   Final   Culture NO GROWTH   Final   Report Status 09/30/2012 FINAL   Final   Studies/Results:  Medications: I have reviewed the patient's current medications. Scheduled Meds: . azaTHIOprine  50 mg Oral Daily  . cefTRIAXone (ROCEPHIN)  IV  1 g Intravenous Q24H  . lactulose  20 g Oral BID  . levETIRAcetam  750 mg Oral Daily  . metoCLOPramide  10 mg Oral QID  . pantoprazole  40 mg Oral Daily  . polyethylene glycol  8.5 g Oral Daily   Continuous Infusions: . sodium chloride 150 mL/hr at 09/30/12 1117   PRN Meds:.ALPRAZolam, HYDROcodone-acetaminophen, hydrOXYzine, ondansetron (ZOFRAN) IV, promethazine  Assessment/Plan  27 yo F with known autoimmune hepatitis and esophageal varices direct admitted from GI clinic with persistent nausea and emesis, dehydration:   1. Emesis and dehydration: A: Etiology is still unclear- Urine culture  negative for UTI, other considerations include gastritis or gastric ulcer P:  -renal US ordered yesterday to eval for pyelonephritis, will discuss with attending but will d/c renal US and will consider discontinuing antibiotics -Patient hyponatremic with sodium 124, this appears to be acute on chronic and may be due to poor PO intake, will start PO sodium chloride tablets with meals.  Since she is asking for discharge, will re-check a PM BMET to make sure sodium is improving and she can follow up in clinic next week for monitoring.  -Lipase and ammonia normal, LFT's are elevated but stable -  Pain control with Vicodin, and nausea control with antiemetics and reglan - change from IV PPI to PO once daily.  -Regular diet  2. Anemia: Admission Hemoglobin 11.2, then  9.7, now 9.4.   - this appears to be normocytic to macrocytic anemia, could be due to Imuran immunosuppressive therapy, B 12 deficiency or folate deficiency, will check B12 and Folate - no clinical signs of bleeding, pt denies blood in stool but will order FOB as well.   3. 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 Code: Full  5. FEN/GI: full diet, PPI, reglan, and zofran PO  6. DVT PPx: Heparin.  Dispo: If sodium stable/improving and patient continues to tolerate PO may be able to discharge today in the afternoon.   Garnell Begeman 10/01/2012, 7:20 AM

## 2012-10-02 DIAGNOSIS — K7460 Unspecified cirrhosis of liver: Secondary | ICD-10-CM

## 2012-10-02 DIAGNOSIS — D649 Anemia, unspecified: Secondary | ICD-10-CM | POA: Diagnosis present

## 2012-10-02 LAB — BASIC METABOLIC PANEL
BUN: 8 mg/dL (ref 6–23)
CO2: 24 mEq/L (ref 19–32)
GFR calc non Af Amer: >90 mL/min (ref >90)
Glucose, Bld: 119 mg/dL — ABNORMAL HIGH (ref 70–99)
Potassium: 3.6 mEq/L (ref 3.5–5.1)
Sodium: 129 mEq/L — ABNORMAL LOW (ref 135–145)

## 2012-10-02 MED ORDER — SODIUM CHLORIDE 1 G PO TABS: 1.0000 g | ORAL_TABLET | Freq: Three times a day (TID) | ORAL | Status: DC

## 2012-10-02 NOTE — Discharge Summary (Signed)
Physician Discharge Summary  Patient ID: [name] MRN: [id number] DOB: 03/07/1986 Age: 27 y.o.  Admit date: 09/29/2012 Discharge date: 10/02/2012 Admitting Physician: Tobey Grim, MD  PCP: Lloyd Huger, MD  Consultants: none   Discharge Diagnosis: Principal Problem:   Nausea and vomiting in adult Active Problems:   Hyponatremia   Sclerosing cholangitis   Anemia  Hospital Course 27 yo F with known autoimmune hepatitis and esophageal varices directly admitted from GI clinic with persistent nausea, emesis, and dehydration.   1. Emesis and dehydration: initially felt to be related to UTI vs gastritis vs peptic ulcer. Patient with UA with positive nitrites and small LE, though urine culture had no growth. She was given ceftriaxone x2 doses prior to UCx resulting. Additionally lipase and ammonia normal, LFT's are elevated but stable near patients baseline over the last 6 months. Patient was on scheduled reglan and prn zofran and promethazine with good control of nausea. Had resolution of nausea and vomiting prior to discharge and was tolerating a normal diet.   2. Hyponatremia: to a low of 124. Appeared to be acute on chronic. Patient was hydrated with NS @ 150 mL/hr and given PO sodium chloride tablets with meals. She was additionally restarted on her home lasix and spironolactone. Sodium up to 129 today with addition of salt tablets and restarting home diuretics.    3. Anemia: Admission Hemoglobin 11.2, then 9.7, now 9.4. This appears to be normocytic to macrocytic anemia, could be due to Imuran immunosuppressive therapy given that folate and B12 were both normal. Patient displayed no signs of bleeding.    4. Known PSC: INR 1.56, which is stable from previous. Ammonia normal at 56.  5. Diarrhea: patient with episodes of diarrhea over 2 days prior to day of discharge. Stated was different than what she typically has with her lactulose. Day of discharge this had resolved and patient  had a solid BM day prior to discharge. C diff ordered though not collected due to no BMs prior to discharge.  Problem List 1. Nausea and vomiting 2. Diarrhea  3. Anemia 4. PSC 5. Hyponatremia        Discharge PE   Filed Vitals:   10/02/12 1215  BP: 117/56  Pulse: 100  Temp: 98.3 F (36.8 C)  Resp: 18   General appearance: alert and awake.  Eyes: Scleral icterus  Lungs: clear to auscultation bilaterally  Heart: rrr, 3/6 systolic murmur  Abdomen: +BS, NT/ND  Extremities: extremities normal, atraumatic, no cyanosis or edema    Procedures/Imaging:  Dg Abd 1 View  09/29/2012  *RADIOLOGY REPORT*  Clinical Data: Nonspecific abdominal pain.  Sclerosing cholangitis.  ABDOMEN - 1 VIEW  Comparison: 04/04/2011  Findings: There is a nonobstructive bowel gas pattern.  No supine evidence of free air.  No organomegaly or suspicious calcification.  No acute bony abnormality.  IMPRESSION: No acute findings.   Original Report Authenticated By: Charlett Nose, M.D.     Labs  CBC  Recent Labs Lab 09/29/12 1210 09/30/12 1154 10/01/12 0715  WBC 4.4 3.4* 4.0  HGB 11.2* 9.7* 9.4*  HCT 30.9* 26.7* 26.4*  PLT 33* 46* 42*   BMET  Recent Labs Lab 09/29/12 1210 09/30/12 1154 10/01/12 0715 10/01/12 1329 10/02/12 0700  NA 128* 127* 124* 127* 129*  K 4.0 3.5 3.5 3.7 3.6  CL 97 98 95* 97 99  CO2 24 23 24 25 24   BUN 11 7 7 7 8   CREATININE 0.60 0.62 0.61 0.59 0.59  CALCIUM 8.3*  7.8* 7.4* 7.8* 7.7*  PROT 5.9* 5.1* 5.1*  --   --   BILITOT 15.0* 15.4* 14.9*  --   --   ALKPHOS 789* 651* 654*  --   --   ALT 63* 74* 104*  --   --   AST 139* 194* 276*  --   --   GLUCOSE 93 163* 152* 133* 119*   Results for orders placed during the hospital encounter of 09/29/12 (from the past 72 hour(s))  URINE CULTURE     Status: None   Collection Time    09/29/12  5:41 PM      Result Value Range   Specimen Description URINE, CLEAN CATCH     Special Requests Normal     Culture  Setup Time 09/29/2012  18:34     Colony Count NO GROWTH     Culture NO GROWTH     Report Status 09/30/2012 FINAL    AMMONIA     Status: None   Collection Time    09/30/12 11:50 AM      Result Value Range   Ammonia 60  11 - 60 umol/L   Comment: ICTERUS AT THIS LEVEL MAY AFFECT RESULT  COMPREHENSIVE METABOLIC PANEL     Status: Abnormal   Collection Time    09/30/12 11:54 AM      Result Value Range   Sodium 127 (*) 135 - 145 mEq/L   Potassium 3.5  3.5 - 5.1 mEq/L   Chloride 98  96 - 112 mEq/L   CO2 23  19 - 32 mEq/L   Glucose, Bld 163 (*) 70 - 99 mg/dL   BUN 7  6 - 23 mg/dL   Creatinine, Ser 1.61  0.50 - 1.10 mg/dL   Comment: ICTERUS AT THIS LEVEL MAY AFFECT RESULT   Calcium 7.8 (*) 8.4 - 10.5 mg/dL   Total Protein 5.1 (*) 6.0 - 8.3 g/dL   Albumin 1.6 (*) 3.5 - 5.2 g/dL   AST 096 (*) 0 - 37 U/L   ALT 74 (*) 0 - 35 U/L   Alkaline Phosphatase 651 (*) 39 - 117 U/L   Total Bilirubin 15.4 (*) 0.3 - 1.2 mg/dL   GFR calc non Af Amer >90  >90 mL/min   GFR calc Af Amer >90  >90 mL/min   Comment:            The eGFR has been calculated     using the CKD EPI equation.     This calculation has not been     validated in all clinical     situations.     eGFR's persistently     <90 mL/min signify     possible Chronic Kidney Disease.  CBC     Status: Abnormal   Collection Time    09/30/12 11:54 AM      Result Value Range   WBC 3.4 (*) 4.0 - 10.5 K/uL   RBC 2.65 (*) 3.87 - 5.11 MIL/uL   Hemoglobin 9.7 (*) 12.0 - 15.0 g/dL   HCT 04.5 (*) 40.9 - 81.1 %   MCV 100.8 (*) 78.0 - 100.0 fL   MCH 36.6 (*) 26.0 - 34.0 pg   MCHC 36.3 (*) 30.0 - 36.0 g/dL   RDW 91.4  78.2 - 95.6 %   Platelets 46 (*) 150 - 400 K/uL   Comment: CONSISTENT WITH PREVIOUS RESULT  COMPREHENSIVE METABOLIC PANEL     Status: Abnormal   Collection Time    10/01/12  7:15 AM      Result Value Range   Sodium 124 (*) 135 - 145 mEq/L   Potassium 3.5  3.5 - 5.1 mEq/L   Chloride 95 (*) 96 - 112 mEq/L   CO2 24  19 - 32 mEq/L   Glucose, Bld 152  (*) 70 - 99 mg/dL   BUN 7  6 - 23 mg/dL   Creatinine, Ser 1.30  0.50 - 1.10 mg/dL   Comment: ICTERUS AT THIS LEVEL MAY AFFECT RESULT   Calcium 7.4 (*) 8.4 - 10.5 mg/dL   Total Protein 5.1 (*) 6.0 - 8.3 g/dL   Albumin 1.6 (*) 3.5 - 5.2 g/dL   AST 865 (*) 0 - 37 U/L   ALT 104 (*) 0 - 35 U/L   Alkaline Phosphatase 654 (*) 39 - 117 U/L   Total Bilirubin 14.9 (*) 0.3 - 1.2 mg/dL   GFR calc non Af Amer >90  >90 mL/min   GFR calc Af Amer >90  >90 mL/min   Comment:            The eGFR has been calculated     using the CKD EPI equation.     This calculation has not been     validated in all clinical     situations.     eGFR's persistently     <90 mL/min signify     possible Chronic Kidney Disease.  CBC WITH DIFFERENTIAL     Status: Abnormal   Collection Time    10/01/12  7:15 AM      Result Value Range   WBC 4.0  4.0 - 10.5 K/uL   RBC 2.60 (*) 3.87 - 5.11 MIL/uL   Hemoglobin 9.4 (*) 12.0 - 15.0 g/dL   HCT 78.4 (*) 69.6 - 29.5 %   MCV 101.5 (*) 78.0 - 100.0 fL   MCH 36.2 (*) 26.0 - 34.0 pg   MCHC 35.6  30.0 - 36.0 g/dL   RDW 28.4  13.2 - 44.0 %   Platelets 42 (*) 150 - 400 K/uL   Comment: CONSISTENT WITH PREVIOUS RESULT     REPEATED TO VERIFY   Neutrophils Relative 72  43 - 77 %   Neutro Abs 2.9  1.7 - 7.7 K/uL   Lymphocytes Relative 13  12 - 46 %   Lymphs Abs 0.5 (*) 0.7 - 4.0 K/uL   Monocytes Relative 12  3 - 12 %   Monocytes Absolute 0.5  0.1 - 1.0 K/uL   Eosinophils Relative 3  0 - 5 %   Eosinophils Absolute 0.1  0.0 - 0.7 K/uL   Basophils Relative 1  0 - 1 %   Basophils Absolute 0.0  0.0 - 0.1 K/uL  VITAMIN B12     Status: Abnormal   Collection Time    10/01/12  8:00 AM      Result Value Range   Vitamin B-12 1230 (*) 211 - 911 pg/mL  FOLATE     Status: None   Collection Time    10/01/12  8:00 AM      Result Value Range   Folate 7.4     Comment: (NOTE)     Reference Ranges            Deficient:       0.4 - 3.3 ng/mL            Indeterminate:   3.4 - 5.4 ng/mL  Normal:              > 5.4 ng/mL  BASIC METABOLIC PANEL     Status: Abnormal   Collection Time    10/01/12  1:29 PM      Result Value Range   Sodium 127 (*) 135 - 145 mEq/L   Potassium 3.7  3.5 - 5.1 mEq/L   Chloride 97  96 - 112 mEq/L   CO2 25  19 - 32 mEq/L   Glucose, Bld 133 (*) 70 - 99 mg/dL   BUN 7  6 - 23 mg/dL   Creatinine, Ser 1.61  0.50 - 1.10 mg/dL   Comment: ICTERUS AT THIS LEVEL MAY AFFECT RESULT   Calcium 7.8 (*) 8.4 - 10.5 mg/dL   GFR calc non Af Amer >90  >90 mL/min   GFR calc Af Amer >90  >90 mL/min   Comment:            The eGFR has been calculated     using the CKD EPI equation.     This calculation has not been     validated in all clinical     situations.     eGFR's persistently     <90 mL/min signify     possible Chronic Kidney Disease.  OSMOLALITY     Status: Abnormal   Collection Time    10/01/12  3:48 PM      Result Value Range   Osmolality 270 (*) 275 - 300 mOsm/kg  BASIC METABOLIC PANEL     Status: Abnormal   Collection Time    10/02/12  7:00 AM      Result Value Range   Sodium 129 (*) 135 - 145 mEq/L   Potassium 3.6  3.5 - 5.1 mEq/L   Chloride 99  96 - 112 mEq/L   CO2 24  19 - 32 mEq/L   Glucose, Bld 119 (*) 70 - 99 mg/dL   BUN 8  6 - 23 mg/dL   Creatinine, Ser 0.96  0.50 - 1.10 mg/dL   Calcium 7.7 (*) 8.4 - 10.5 mg/dL   GFR calc non Af Amer >90  >90 mL/min   GFR calc Af Amer >90  >90 mL/min   Comment:            The eGFR has been calculated     using the CKD EPI equation.     This calculation has not been     validated in all clinical     situations.     eGFR's persistently     <90 mL/min signify     possible Chronic Kidney Disease.       Patient condition at time of discharge/disposition: stable  Disposition-home   Follow up issues: 1. Patient with hyponatremia in hospital, sent home with salt tablets to take TID and advised to restrict free water intake. Please check a sodium level in clinic. 2. Patient with flat mood,  consider depression screening 3. Anemia, appears to have baseline anemia that is macrocytic in nature, normal folate and B12, consider additional work-up though likely related to immunosuppressive therapy 4. Patient is not supposed to be on torsemide  Discharge follow up:  Follow-up Information   Follow up with Lloyd Huger, MD On 10/05/2012. (3:15 pm)    Contact information:   [address] [place] [address] [phone]       Future Appointments Provider Department Dept Phone   10/05/2012 3:15 PM Durwin Reges, MD   [facility] [phone]       Discharge Instructions: Please refer to Patient Instructions section of EMR for full details.  Patient was counseled important signs and symptoms that should prompt return to medical care, changes in medications, dietary instructions, activity restrictions, and follow up appointments.    Discharge Medications   Medication List    STOP taking these medications       torsemide 20 MG tablet  Commonly known as:  DEMADEX      TAKE these medications       ALPRAZolam 0.5 MG tablet  Commonly known as:  XANAX  Take 1-2 tablets (0.5-1 mg total) by mouth at bedtime as needed. Take 1-2 tablets at bedtime as needed for sleep     azaTHIOprine 50 MG tablet  Commonly known as:  IMURAN  Take 50 mg by mouth daily.     CARAFATE 1 GM/10ML suspension  Generic drug:  sucralfate  Take 1 g by mouth as needed.     cephALEXin 500 MG capsule  Commonly known as:  KEFLEX  Take 500 mg by mouth 3 (three) times daily.     EPINEPHrine 0.3 mg/0.3 mL Devi  Commonly known as:  EPI-PEN  Inject 0.3 mg into the muscle once.     furosemide 40 MG tablet  Commonly known as:  LASIX  Take 40 mg by mouth daily.     HYDROcodone-acetaminophen 5-325 MG per tablet  Commonly known as:  NORCO  Take 1 tablet by mouth every 6 (six) hours as needed for pain.     Lactulose 20 GM/30ML Soln  Take 30 mLs (20 g total) by mouth daily as  needed. For constipation     levETIRAcetam 750 MG tablet  Commonly known as:  KEPPRA  Take 750 mg by mouth daily.     metoCLOPramide 10 MG tablet  Commonly known as:  REGLAN  Take 10 mg by mouth 4 (four) times daily.     ondansetron 4 MG disintegrating tablet  Commonly known as:  ZOFRAN-ODT  Take 4 mg by mouth every 6 (six) hours as needed. For nausea     pantoprazole 40 MG tablet  Commonly known as:  PROTONIX  Take 1 tablet (40 mg total) by mouth daily.     potassium chloride SA 20 MEQ tablet  Commonly known as:  K-DUR,KLOR-CON  Take 1 tablet (20 mEq total) by mouth 2 (two) times daily.     promethazine 25 MG suppository  Commonly known as:  PHENERGAN  Place 25 mg rectally every 6 (six) hours as needed. For nausea     sodium chloride 1 G tablet  Take 1 tablet (1 g total) by mouth 3 (three) times daily with meals.     spironolactone 100 MG tablet  Commonly known as:  ALDACTONE  Take 2 tablets (200 mg total) by mouth daily.            Marikay Alar, MD of [facility] 10/02/2012 2:36 PM

## 2012-10-02 NOTE — Progress Notes (Signed)
Patient discharged to home.  Discharge teaching completed including follow up care and medications.  Verbalizes understanding with no further questions.  Discharged per wheelchair with Grandmother. Vital signs stable, no complaints of nausea.

## 2012-10-02 NOTE — Progress Notes (Signed)
Family Medicine Teaching Service Attending Note  I interviewed and examined patient [name] and reviewed their tests and x-rays.  I discussed with Dr. Birdie Sons and reviewed their note for today.  I agree with their assessment and plan.     Additionally  Feels back to baseline She thinks she can care for herself at home [facility] to discharge with close follow up of labs and mood

## 2012-10-02 NOTE — Progress Notes (Signed)
Family Medicine Teaching Service Daily Progress Note Service Pager: [phone]  Subjective: Laying in bed. Denies nausea, vomiting, and diarrhea. She is concerned about returning home and overeating once she gets there since this has resulted in her being hospitalized previously.  Objective: Vital signs in last 24 hours: Filed Vitals:   10/01/12 0554 10/01/12 1416 10/01/12 2110 10/02/12 0509  BP: 116/59 136/67 132/56 117/46  Pulse: 97 98 105 97  Temp: 98.1 F (36.7 C) 98.2 F (36.8 C) 98.9 F (37.2 C) 98.2 F (36.8 C)  TempSrc: Oral Oral    Resp: 16 16 16 16   Height:      Weight:      SpO2: 99% 100% 100% 100%   Weight change:  No intake or output data in the 24 hours ending 10/02/12 0942 General appearance: alert and awake. Eyes: Scleral icterus Lungs: clear to auscultation bilaterally Heart: rrr, 3/6 systolic murmur Abdomen: +BS, NT/ND Extremities: extremities normal, atraumatic, no cyanosis or edema Lab Results:  Recent Labs  09/30/12 1154 10/01/12 0715  WBC 3.4* 4.0  HGB 9.7* 9.4*  HCT 26.7* 26.4*  PLT 46* 42*    Recent Labs  10/01/12 1329 10/02/12 0700  NA 127* 129*  K 3.7 3.6  CL 97 99  CO2 25 24  GLUCOSE 133* 119*  BUN 7 8  CREATININE 0.59 0.59  CALCIUM 7.8* 7.7*   Lab Results  Component Value Date   ALT 104* 10/01/2012   AST 276* 10/01/2012   ALKPHOS 654* 10/01/2012   BILITOT 14.9* 10/01/2012   Lab Results  Component Value Date   LIPASE 20 09/29/2012   Ammonia: 56 Urine Pregnancy: Negative B12 1230 Folate 7.4  Micro Results: Recent Results (from the past 240 hour(s))  URINE CULTURE     Status: None   Collection Time    09/25/12  5:00 PM      Result Value Range Status   Colony Count >=100,000 COLONIES/ML   Final   Organism ID, Bacteria Multiple bacterial morphotypes present, none   Final   Organism ID, Bacteria predominant. Suggest appropriate recollection if    Final   Organism ID, Bacteria clinically indicated.   Final  URINE CULTURE      Status: None   Collection Time    09/29/12  5:41 PM      Result Value Range Status   Specimen Description URINE, CLEAN CATCH   Final   Special Requests Normal   Final   Culture  Setup Time 09/29/2012 18:34   Final   Colony Count NO GROWTH   Final   Culture NO GROWTH   Final   Report Status 09/30/2012 FINAL   Final   Studies/Results:  Medications: I have reviewed the patient's current medications. Scheduled Meds: . azaTHIOprine  50 mg Oral Daily  . cefTRIAXone (ROCEPHIN)  IV  1 g Intravenous Q24H  . furosemide  40 mg Oral Daily  . lactulose  20 g Oral BID  . levETIRAcetam  750 mg Oral Daily  . metoCLOPramide  10 mg Oral QID  . pantoprazole  40 mg Oral Daily  . polyethylene glycol  8.5 g Oral Daily  . sodium chloride  1 g Oral TID WC  . spironolactone  200 mg Oral Daily   Continuous Infusions: . sodium chloride 150 mL/hr at 09/30/12 1117   PRN Meds:.ALPRAZolam, HYDROcodone-acetaminophen, hydrOXYzine, ondansetron  Assessment/Plan  27 yo F with known autoimmune hepatitis and esophageal varices direct admitted from GI clinic with persistent nausea and emesis, dehydration:  1. Emesis and dehydration: resolved-etiology unclear -Lipase and ammonia normal, LFT's are elevated but stable -Pain control with Vicodin, and nausea control with antiemetics and reglan -change from IV PPI to PO once daily.  -Regular diet  2. Hyponatremia: up to 129 today with addition of salt tablets and restarting home diuretics -will discharge home on salt tablets and encourage to limit free water intake  3. Anemia: Admission Hemoglobin 11.2, then 9.7, now 9.4.   - this appears to be normocytic to macrocytic anemia, could be due to Imuran immunosuppressive therapy - no clinical signs of bleeding, pt denies blood in stool but will order FOB as well.   4. Known PSC:  INR 1.56, which is stable from previous ammonia normal at 56  Code: Full  FEN/GI: full diet, PPI, reglan, and zofran PO  DVT PPx:  Heparin.  Dispo: discharge home today with close f/u  Marikay Alar 10/02/2012, 9:42 AM

## 2012-10-03 ENCOUNTER — Ambulatory Visit: Payer: Medicare Other | Admitting: Family Medicine

## 2012-10-03 NOTE — Discharge Summary (Signed)
I have reviewed this discharge summary and agree.    

## 2012-10-05 ENCOUNTER — Ambulatory Visit (INDEPENDENT_AMBULATORY_CARE_PROVIDER_SITE_OTHER): Payer: Medicare Other | Admitting: Family Medicine

## 2012-10-05 ENCOUNTER — Inpatient Hospital Stay: Payer: Medicare Other | Admitting: Family Medicine

## 2012-10-05 ENCOUNTER — Encounter: Payer: Self-pay | Admitting: Family Medicine

## 2012-10-05 VITALS — BP 128/65 | HR 102 | Temp 98.7°F | Wt 166.2 lb

## 2012-10-05 DIAGNOSIS — E871 Hypo-osmolality and hyponatremia: Secondary | ICD-10-CM

## 2012-10-05 LAB — BASIC METABOLIC PANEL
BUN: 14 mg/dL (ref 6–23)
CO2: 27 mEq/L (ref 19–32)
Chloride: 95 mEq/L — ABNORMAL LOW (ref 96–112)
Creat: 0.83 mg/dL (ref 0.50–1.10)
Glucose, Bld: 97 mg/dL (ref 70–99)
Potassium: 4.3 mEq/L (ref 3.5–5.3)

## 2012-10-05 LAB — CBC
MCH: 35.4 pg — ABNORMAL HIGH (ref 26.0–34.0)
MCHC: 36.5 g/dL — ABNORMAL HIGH (ref 30.0–36.0)
Platelets: 73 10*3/uL — ABNORMAL LOW (ref 150–400)

## 2012-10-05 MED ORDER — FUROSEMIDE 40 MG/4ML PO SOLN
4.0000 mL | Freq: Once | ORAL | Status: AC
  Administered 2012-10-05: 40 mg via INTRAMUSCULAR

## 2012-10-05 MED ORDER — FUROSEMIDE 40 MG PO TABS: 120.0000 mg | ORAL_TABLET | Freq: Two times a day (BID) | ORAL | Status: DC

## 2012-10-05 NOTE — Assessment & Plan Note (Signed)
Resolved currently. 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 Continue when necessary anti-emetics.

## 2012-10-05 NOTE — Assessment & Plan Note (Addendum)
Leg abdominal edema/anasarca is worsened with discontinuation of her torsemide during the hospital. She has gained 13 pounds. Hard to determine her exam dry weight, but would guess this is approximately 150 pounds. Administered Lasix 40 mg IM in clinic today. Will increase her Lasix to 120 mg twice a day, continue spironolactone at current dose. Patient does not understand why we don't to switch back to torsemide at this point, though will avoid given her history of severe dehydration taking this medication. She agrees to followup in one week course care sooner if her symptoms worsen. Will check sodium and potassium today.  Precepted with Dr. Gwendolyn Grant.

## 2012-10-05 NOTE — Assessment & Plan Note (Signed)
This most likely underlies her hyponatremia. She is asymptomatic. Recommend she stop the salt tablets, to prevent worsening edema at this time. Will check electrolytes and blood counts.

## 2012-10-05 NOTE — Patient Instructions (Addendum)
Stop taking salt. You need extra diuresis. Increase your lasix to 120 mg (three tabs) twice daily. Make appointment in one week for check up.

## 2012-10-05 NOTE — Progress Notes (Signed)
  Subjective:    Patient ID: [name], female    DOB: [birth year], 27 y.o.   MRN: [id number]  HPI Hospital f/u DC 2/24 for dehydration after recurrence of n/v.  1. Nausea, emesis. This is currently resolved. She is not requiring antiemetics.  2. Lower extremity edema. Her abdominal and leg swelling has worsened since her torsemide was discontinued. She relates is currently taking spironolactone 100 mg twice a day and Lasix 80 mg twice a day. This was titrated in the hospital. Her torsemide was stopped in the setting of recurrent dehydration and hyponatremia. Since her hospital discharge 3 days ago, she had gained significant weight about 13 pounds per our records. Her legs are painful, she is wearing compression stockings.   3. Hyponatremia. She just started taking her salt tablets 2 days ago. Sodium nadir was 124 during hospitalization in 127 on discharge. She is currently eating a normal diet.  Review of Systems Denies wounds, bleeding, injury, abdominal pain, fever, chills, dysuria, back pain.     Objective:   Physical Exam  Vitals reviewed. Constitutional: She is oriented to person, place, and time. No distress.  HENT:  Head: Normocephalic and atraumatic.  Mouth/Throat: Oropharynx is clear and moist.  Eyes: EOM are normal.  Cardiovascular: Normal rate, regular rhythm and normal heart sounds.   No murmur heard. Pulmonary/Chest: Effort normal and breath sounds normal. No respiratory distress. She has no wheezes. She has no rales.  Abdominal: Soft. Bowel sounds are normal. She exhibits distension. There is no tenderness. There is no rebound and no guarding.  Mild distention compared to previous exams.  Musculoskeletal: She exhibits edema. She exhibits no tenderness.  Bilateral LE edema 1-2 + pitting, symmetric. nontender. Compression stockings in place.  Neurological: She is alert and oriented to person, place, and time.  Skin: No rash noted. She is not diaphoretic.  Psychiatric:  She has a normal mood and affect.       Assessment & Plan:

## 2012-10-05 NOTE — Assessment & Plan Note (Signed)
.  Setting with worsened edema. Check BMET.

## 2012-10-13 ENCOUNTER — Ambulatory Visit: Payer: Medicare Other | Admitting: Family Medicine

## 2012-10-18 ENCOUNTER — Telehealth: Payer: Self-pay | Admitting: *Deleted

## 2012-10-18 NOTE — Telephone Encounter (Signed)
sw pt appt d/t was given for 11/10/2012@ 9:30am.

## 2012-10-19 ENCOUNTER — Ambulatory Visit (INDEPENDENT_AMBULATORY_CARE_PROVIDER_SITE_OTHER): Payer: Medicare Other | Admitting: Family Medicine

## 2012-10-19 ENCOUNTER — Encounter: Payer: Self-pay | Admitting: Family Medicine

## 2012-10-19 VITALS — BP 108/52 | HR 69 | Temp 98.7°F | Ht 63.0 in | Wt 151.0 lb

## 2012-10-19 DIAGNOSIS — K7460 Unspecified cirrhosis of liver: Secondary | ICD-10-CM

## 2012-10-19 DIAGNOSIS — R112 Nausea with vomiting, unspecified: Secondary | ICD-10-CM

## 2012-10-19 DIAGNOSIS — H66009 Acute suppurative otitis media without spontaneous rupture of ear drum, unspecified ear: Secondary | ICD-10-CM | POA: Insufficient documentation

## 2012-10-19 DIAGNOSIS — R188 Other ascites: Secondary | ICD-10-CM

## 2012-10-19 DIAGNOSIS — E871 Hypo-osmolality and hyponatremia: Secondary | ICD-10-CM

## 2012-10-19 MED ORDER — FUROSEMIDE 40 MG PO TABS: 80.0000 mg | ORAL_TABLET | Freq: Two times a day (BID) | ORAL | Status: DC

## 2012-10-19 MED ORDER — SPIRONOLACTONE 100 MG PO TABS: 100.0000 mg | ORAL_TABLET | Freq: Every day | ORAL | Status: DC

## 2012-10-19 MED ORDER — AMOXICILLIN 500 MG PO TABS: 500.0000 mg | ORAL_TABLET | Freq: Two times a day (BID) | ORAL | Status: DC

## 2012-10-19 NOTE — Progress Notes (Signed)
  Subjective:    Patient ID: [name], female    DOB: [birth year], 27 y.o.   MRN: [id number]  Otalgia     1. Right ear pain. Started 2 days ago, throbbing constant pain rated 5-10/10 since onset. Noticed a popping sensation and some ringing. No hearing problems. She does have some glandular swelling.  No foreign bodies, rhinorrhea, drainage, fever, chills, submersion of head underwater, sore throat.   2. Leg edema/ascites. Improved since last visit. Weight down from 166 lbs to 151. She is basically taking her lasix prn (now at 80 mg BID), but missed today's dose. Taking spironolactone 100 mg daily. Denies abdominal pain, leg pain, injury, numbness, rash.  3. Nausea and vomiting. Resolved. Patient now admits her symptoms were probably started from drinking some alcohol, but she has been abstinent since discharge and no longer requiring anti-emetics.  Review of Systems  HENT: Positive for ear pain.    See HPI otherwise negative.  reports that she quit smoking about 7 years ago. Her smoking use included Cigarettes. She smoked 0.00 packs per day. She has never used smokeless tobacco.     Objective:   Physical Exam  Vitals reviewed. Constitutional: She is oriented to person, place, and time. She appears well-developed and well-nourished. No distress.  Appears well, smiles.   HENT:  Head: Normocephalic and atraumatic.  Left Ear: External ear normal.  Nose: Nose normal.  Mouth/Throat: Oropharynx is clear and moist. No oropharyngeal exudate.  Right TM intact with purulent air fluid level. Some pain with movement of pinna, but external canal appears wnl without lesion.  Scleral icterus slightly improved.  Eyes: EOM are normal. Pupils are equal, round, and reactive to light.  Neck: Normal range of motion. Neck supple. No thyromegaly present.  Right submandibular LAD, nontender.  Cardiovascular: Normal rate, regular rhythm and normal heart sounds.   No murmur heard. Pulmonary/Chest:  Effort normal and breath sounds normal. No respiratory distress. She has no wheezes. She has no rales.  Lymphadenopathy:    She has cervical adenopathy.  Neurological: She is alert and oriented to person, place, and time.  Skin: She is not diaphoretic.  Psychiatric: She has a normal mood and affect.        Assessment & Plan:

## 2012-10-19 NOTE — Assessment & Plan Note (Signed)
Improved with lasix at higher dose. Her dry weight seems to be about 150 lbs currently. Advised patient the best strategy is usually taking a stable daily dose of diuretic and suggested this should be 80 mg BID (her previous stable dose). Continue daily spironolactone. Will check electrolytes today since she appears euvolemic. F/u in 3 weeks.

## 2012-10-19 NOTE — Assessment & Plan Note (Signed)
Amoxicillin prescription for acute OM. Discussed red flags to seek emergency care, otherwise f/u in 2-3 weeks.

## 2012-10-19 NOTE — Patient Instructions (Addendum)
You have an infection in your ear. Use the antibiotic amoxicillin for next 7 days. Take your lasix when you get home. Make an appointment for check up in 2-3 weeks.   Call if symptoms worsen or fail to improve with treatment.

## 2012-10-19 NOTE — Assessment & Plan Note (Signed)
Recheck sodium and potassium with recent fluid shifts.

## 2012-10-19 NOTE — Assessment & Plan Note (Signed)
Resolved for past 2 weeks. Suspect this could have been triggered by EtOH due to patient's confession today. DC carafate since she is not using and now asymptomatic. F/u prn.

## 2012-10-20 ENCOUNTER — Other Ambulatory Visit: Payer: Self-pay | Admitting: Family Medicine

## 2012-10-20 ENCOUNTER — Telehealth: Payer: Self-pay | Admitting: Family Medicine

## 2012-10-20 LAB — BASIC METABOLIC PANEL
Calcium: 7.6 mg/dL — ABNORMAL LOW (ref 8.4–10.5)
Potassium: 3.1 mEq/L — ABNORMAL LOW (ref 3.5–5.3)
Sodium: 136 mEq/L (ref 135–145)

## 2012-10-20 NOTE — Telephone Encounter (Signed)
Please call patient to inform her potassium was low, needs to restart her potassium pills 20 meq twice daily.

## 2012-10-20 NOTE — Telephone Encounter (Signed)
LVM for patient to call back to inform her of below 

## 2012-10-20 NOTE — Telephone Encounter (Signed)
Related message,pt voiced understanding. Proposito, Giovanna S  

## 2012-10-24 ENCOUNTER — Ambulatory Visit: Payer: Medicare Other | Admitting: Family Medicine

## 2012-10-30 ENCOUNTER — Other Ambulatory Visit: Payer: Self-pay | Admitting: Family Medicine

## 2012-11-03 ENCOUNTER — Encounter (HOSPITAL_COMMUNITY): Payer: Self-pay | Admitting: Emergency Medicine

## 2012-11-03 ENCOUNTER — Encounter: Payer: Self-pay | Admitting: Family Medicine

## 2012-11-03 ENCOUNTER — Telehealth: Payer: Self-pay | Admitting: Family Medicine

## 2012-11-03 ENCOUNTER — Other Ambulatory Visit: Payer: Self-pay

## 2012-11-03 ENCOUNTER — Emergency Department (HOSPITAL_COMMUNITY)
Admission: EM | Admit: 2012-11-03 | Discharge: 2012-11-03 | Disposition: A | Discharge status: Home / Self Care | Payer: Medicare Other | Attending: Emergency Medicine | Admitting: Emergency Medicine

## 2012-11-03 ENCOUNTER — Ambulatory Visit (INDEPENDENT_AMBULATORY_CARE_PROVIDER_SITE_OTHER): Payer: Medicare Other | Admitting: Family Medicine

## 2012-11-03 VITALS — BP 135/73 | Temp 98.2°F | Wt 162.0 lb

## 2012-11-03 DIAGNOSIS — F1410 Cocaine abuse, uncomplicated: Secondary | ICD-10-CM | POA: Insufficient documentation

## 2012-11-03 DIAGNOSIS — K8309 Other cholangitis: Secondary | ICD-10-CM

## 2012-11-03 DIAGNOSIS — G40909 Epilepsy, unspecified, not intractable, without status epilepticus: Secondary | ICD-10-CM

## 2012-11-03 DIAGNOSIS — R109 Unspecified abdominal pain: Secondary | ICD-10-CM | POA: Insufficient documentation

## 2012-11-03 DIAGNOSIS — K754 Autoimmune hepatitis: Secondary | ICD-10-CM

## 2012-11-03 DIAGNOSIS — R569 Unspecified convulsions: Secondary | ICD-10-CM

## 2012-11-03 DIAGNOSIS — Z8719 Personal history of other diseases of the digestive system: Secondary | ICD-10-CM | POA: Insufficient documentation

## 2012-11-03 DIAGNOSIS — R1110 Vomiting, unspecified: Secondary | ICD-10-CM | POA: Insufficient documentation

## 2012-11-03 DIAGNOSIS — E860 Dehydration: Secondary | ICD-10-CM

## 2012-11-03 DIAGNOSIS — R112 Nausea with vomiting, unspecified: Secondary | ICD-10-CM

## 2012-11-03 DIAGNOSIS — K7460 Unspecified cirrhosis of liver: Secondary | ICD-10-CM

## 2012-11-03 DIAGNOSIS — F1210 Cannabis abuse, uncomplicated: Secondary | ICD-10-CM | POA: Insufficient documentation

## 2012-11-03 DIAGNOSIS — Z79899 Other long term (current) drug therapy: Secondary | ICD-10-CM | POA: Insufficient documentation

## 2012-11-03 DIAGNOSIS — Z8679 Personal history of other diseases of the circulatory system: Secondary | ICD-10-CM | POA: Insufficient documentation

## 2012-11-03 DIAGNOSIS — Z3202 Encounter for pregnancy test, result negative: Secondary | ICD-10-CM | POA: Insufficient documentation

## 2012-11-03 DIAGNOSIS — Z87891 Personal history of nicotine dependence: Secondary | ICD-10-CM | POA: Insufficient documentation

## 2012-11-03 HISTORY — DX: Unspecified convulsions: R56.9

## 2012-11-03 LAB — URINALYSIS, ROUTINE W REFLEX MICROSCOPIC
Nitrite: POSITIVE — AB
Specific Gravity, Urine: 1.034 — ABNORMAL HIGH (ref 1.005–1.030)
Urobilinogen, UA: >8 mg/dL — ABNORMAL HIGH (ref 0.0–1.0)
pH: 7.5 (ref 5.0–8.0)

## 2012-11-03 LAB — CBC WITH DIFFERENTIAL/PLATELET
Eosinophils Relative: 0 % (ref 0–5)
HCT: 26.6 % — ABNORMAL LOW (ref 36.0–46.0)
Hemoglobin: 9.3 g/dL — ABNORMAL LOW (ref 12.0–15.0)
Lymphocytes Relative: 7 % — ABNORMAL LOW (ref 12–46)
Lymphs Abs: 0.3 10*3/uL — ABNORMAL LOW (ref 0.7–4.0)
MCV: 103.9 fL — ABNORMAL HIGH (ref 78.0–100.0)
Monocytes Absolute: 0.2 10*3/uL (ref 0.1–1.0)
RBC: 2.56 MIL/uL — ABNORMAL LOW (ref 3.87–5.11)
WBC: 3.7 10*3/uL — ABNORMAL LOW (ref 4.0–10.5)

## 2012-11-03 LAB — COMPREHENSIVE METABOLIC PANEL
AST: 290 U/L — ABNORMAL HIGH (ref 0–37)
Albumin: 1.8 g/dL — ABNORMAL LOW (ref 3.5–5.2)
CO2: 27 mEq/L (ref 19–32)
Calcium: 8.2 mg/dL — ABNORMAL LOW (ref 8.4–10.5)
Creatinine, Ser: 0.71 mg/dL (ref 0.50–1.10)
GFR calc non Af Amer: >90 mL/min (ref >90)
Total Protein: 6 g/dL (ref 6.0–8.3)

## 2012-11-03 LAB — AMMONIA: Ammonia: 32 umol/L (ref 11–60)

## 2012-11-03 LAB — URINE MICROSCOPIC-ADD ON

## 2012-11-03 LAB — PREGNANCY, URINE: Preg Test, Ur: NEGATIVE

## 2012-11-03 MED ORDER — SODIUM CHLORIDE 0.9 % IV BOLUS (SEPSIS)
1000.0000 mL | Freq: Once | INTRAVENOUS | Status: AC
  Administered 2012-11-03: 1000 mL via INTRAVENOUS

## 2012-11-03 MED ORDER — ONDANSETRON HCL 4 MG PO TABS: 4.0000 mg | ORAL_TABLET | Freq: Three times a day (TID) | ORAL | Status: DC | PRN

## 2012-11-03 MED ORDER — ONDANSETRON HCL 4 MG/2ML IJ SOLN
4.0000 mg | Freq: Once | INTRAMUSCULAR | Status: AC
  Administered 2012-11-03: 4 mg via INTRAVENOUS
  Filled 2012-11-03: qty 2

## 2012-11-03 MED ORDER — FENTANYL CITRATE 0.05 MG/ML IJ SOLN
50.0000 ug | Freq: Once | INTRAMUSCULAR | Status: AC
  Administered 2012-11-03: 50 ug via INTRAVENOUS
  Filled 2012-11-03: qty 2

## 2012-11-03 MED ORDER — HYDROCODONE-ACETAMINOPHEN 5-325 MG PO TABS: 1.0000 | ORAL_TABLET | Freq: Four times a day (QID) | ORAL | Status: DC | PRN

## 2012-11-03 NOTE — ED Provider Notes (Signed)
History     CSN: [id number]  Arrival date & time 11/03/12  1141   First MD Initiated Contact with Patient 11/03/12 1153      Chief Complaint  Patient presents with  . Seizures    (Consider location/radiation/quality/duration/timing/severity/associated sxs/prior treatment) Patient is a 27 y.o. female presenting with seizures.  Seizures  Pt with history of autoimmune hepatitis and seizures reports she has been vomiting several times since yesterday, associated with diffuse abdominal cramping, but no diarrhea. She was seen in the PCP office today where she reportedly had two seizure like episodes but no reported post-ictal periods. She does not remember either episode. Sent to the ED for eval. She is still complaining of moderate aching diffuse abdominal pain, some associated nausea. No particular provoking or relieving factors.  Past Medical History  Diagnosis Date  . Substance abuse April 2013    Per [facility] notes - History of cocaine plus THC usage - limits liver transplant options  . Esophageal varices   . Cirrhosis of liver   . Autoimmune hepatitis     Confirmed via liver biopsy 2002  . PSC (primary sclerosing cholangitis)   . Seizures     Past Surgical History  Procedure Laterality Date  . Esophagogastroduodenoscopy  07/18/2012    Procedure: ESOPHAGOGASTRODUODENOSCOPY (EGD);  Surgeon: Louis Meckel, MD;  Location: [facility];  Service: Endoscopy;  Laterality: N/A;  . Gastric varices banding  07/18/2012    Procedure: GASTRIC VARICES BANDING;  Surgeon: Louis Meckel, MD;  Location: [facility];  Service: Endoscopy;  Laterality: N/A;    Family History  Problem Relation Age of Onset  . Cancer Neg Hx     History  Substance Use Topics  . Smoking status: Former Smoker    Types: Cigarettes    Quit date: 08/09/2005  . Smokeless tobacco: Never Used  . Alcohol Use: No    OB History   Grav Para Term Preterm Abortions TAB SAB Ect Mult Living                  Review  of Systems  Neurological: Positive for seizures.   All other systems reviewed and are negative except as noted in HPI.   Allergies  Ibuprofen  Home Medications   Current Outpatient Rx  Name  Route  Sig  Dispense  Refill  . ALPRAZolam (XANAX) 0.5 MG tablet   Oral   Take 1-2 tablets (0.5-1 mg total) by mouth at bedtime as needed. Take 1-2 tablets at bedtime as needed for sleep   15 tablet   0   . amoxicillin (AMOXIL) 500 MG tablet   Oral   Take 1 tablet (500 mg total) by mouth 2 (two) times daily.   14 tablet   0   . azaTHIOprine (IMURAN) 50 MG tablet   Oral   Take 50 mg by mouth daily.          [name] EPINEPHrine (EPI-PEN) 0.3 mg/0.3 mL DEVI   Intramuscular   Inject 0.3 mg into the muscle once.         . furosemide (LASIX) 40 MG tablet   Oral   Take 2 tablets (80 mg total) by mouth 2 (two) times daily.   90 tablet   0   . HYDROcodone-acetaminophen (NORCO) 5-325 MG per tablet   Oral   Take 1 tablet by mouth every 6 (six) hours as needed for pain.   30 tablet   0   . KLOR-CON M20 20 MEQ tablet  take 1 tablet by mouth twice a day   60 tablet   0   . Lactulose 20 GM/30ML SOLN   Oral   Take 30 mLs (20 g total) by mouth daily as needed. For constipation   500 mL   0   . levETIRAcetam (KEPPRA) 750 MG tablet   Oral   Take 750 mg by mouth daily.          . metoCLOPramide (REGLAN) 10 MG tablet   Oral   Take 10 mg by mouth 4 (four) times daily.         . ondansetron (ZOFRAN-ODT) 4 MG disintegrating tablet   Oral   Take 4 mg by mouth every 6 (six) hours as needed. For nausea         . pantoprazole (PROTONIX) 40 MG tablet   Oral   Take 1 tablet (40 mg total) by mouth daily.   30 tablet   3   . promethazine (PHENERGAN) 25 MG suppository   Rectal   Place 25 mg rectally every 6 (six) hours as needed. For nausea         . sodium chloride 1 G tablet   Oral   Take 1 tablet (1 g total) by mouth 3 (three) times daily with meals.   12 tablet   0    . spironolactone (ALDACTONE) 100 MG tablet   Oral   Take 1 tablet (100 mg total) by mouth daily.   60 tablet   0     BP 125/68  Temp(Src) 97.4 F (36.3 C) (Oral)  Resp 12  SpO2 100%  Physical Exam  Nursing note and vitals reviewed. Constitutional: She is oriented to person, place, and time. She appears well-developed and well-nourished.  HENT:  Head: Normocephalic and atraumatic.  Eyes: EOM are normal. Pupils are equal, round, and reactive to light.  Neck: Normal range of motion. Neck supple.  Cardiovascular: Normal rate, normal heart sounds and intact distal pulses.   Pulmonary/Chest: Effort normal and breath sounds normal.  Abdominal: Bowel sounds are normal. She exhibits no distension. There is tenderness (diffuse mild). There is no rebound and no guarding.  Musculoskeletal: Normal range of motion. She exhibits no edema and no tenderness.  Neurological: She is alert and oriented to person, place, and time. She has normal strength. No cranial nerve deficit or sensory deficit.  Skin: Skin is warm and dry. No rash noted.  Psychiatric: She has a normal mood and affect.    ED Course  Procedures (including critical care time)  Labs Reviewed  CBC WITH DIFFERENTIAL - Abnormal; Notable for the following:    WBC 3.7 (*)    RBC 2.56 (*)    Hemoglobin 9.3 (*)    HCT 26.6 (*)    MCV 103.9 (*)    MCH 36.3 (*)    RDW 16.3 (*)    Platelets 41 (*)    Neutrophils Relative 86 (*)    Lymphocytes Relative 7 (*)    Lymphs Abs 0.3 (*)    All other components within normal limits  COMPREHENSIVE METABOLIC PANEL - Abnormal; Notable for the following:    Sodium 132 (*)    Glucose, Bld 130 (*)    Calcium 8.2 (*)    Albumin 1.8 (*)    AST 290 (*)    ALT 128 (*)    Alkaline Phosphatase 815 (*)    Total Bilirubin 13.9 (*)    All other components within normal limits  URINALYSIS, ROUTINE  W REFLEX MICROSCOPIC - Abnormal; Notable for the following:    Color, Urine ORANGE (*)     APPearance CLOUDY (*)    Specific Gravity, Urine 1.034 (*)    Hgb urine dipstick TRACE (*)    Bilirubin Urine LARGE (*)    Ketones, ur 15 (*)    Protein, ur 30 (*)    Urobilinogen, UA >8.0 (*)    Nitrite POSITIVE (*)    Leukocytes, UA SMALL (*)    All other components within normal limits  URINE MICROSCOPIC-ADD ON - Abnormal; Notable for the following:    Squamous Epithelial / LPF MANY (*)    Bacteria, UA FEW (*)    All other components within normal limits  PREGNANCY, URINE  AMMONIA   No results found.   1. Vomiting   2. Autoimmune hepatitis   3. Dehydration   4. Seizure       MDM   Date: 11/03/2012  Rate: 79  Rhythm: normal sinus rhythm  QRS Axis: normal  Intervals: normal  ST/T Wave abnormalities: normal  Conduction Disutrbances: none  Narrative Interpretation: unremarkable  2:01 PM Pt feeling better, pain controlled, able to tolerate PO fluids. Labs are at baseline. No significant electrolyte abnormalities. Discussed with [facility] resident who agrees with plan for discharge with close outpatient followup. Pt advised to continue antiepileptics.           Maghan Jessee B. Bernette Mayers, MD 11/03/12 1404

## 2012-11-03 NOTE — Telephone Encounter (Signed)
Patient would like to speak to the nurse about what she needs to do about constant vomiting.  She was last seen on 3/13 for this same thing and nothing has helped.

## 2012-11-03 NOTE — Progress Notes (Signed)
Same Day Appointment: Patient called earlier complaining of intractable vomiting.    She was brought into the exam room, was sitting up on the exam table (BP 135/73) when she appeared to begin to loose consciousness.   She was laid down, and she was having rapid eye movements and left sided myotonic movements in arm and foot.  She was in this state for 1-2 minutes and EMS was called.   She regained consciousness and spoke to Dr. Gwendolyn Grant and I.  She says she has been vomiting x 2 days.  She tried to eat some jello this morning and threw it up.  She drove herself to the clinic.  She says she felt some chest pain and numbness just prior to losing consciousness.  She also endorsed numbness around her mouth.  Then she began to loose consciousness again, with rapid eye movements.  BP was 122/68, pulse ox 100% on RA with Pulse of 95.    Past Medical History  Diagnosis Date  . Substance abuse April 2013    Per [facility] notes - History of cocaine plus THC usage - limits liver transplant options  . Esophageal varices   . Cirrhosis of liver   . Autoimmune hepatitis     Confirmed via liver biopsy 2002  . PSC (primary sclerosing cholangitis)    Past Surgical History  Procedure Laterality Date  . Esophagogastroduodenoscopy  07/18/2012    Procedure: ESOPHAGOGASTRODUODENOSCOPY (EGD);  Surgeon: Louis Meckel, MD;  Location: [facility];  Service: Endoscopy;  Laterality: N/A;  . Gastric varices banding  07/18/2012    Procedure: GASTRIC VARICES BANDING;  Surgeon: Louis Meckel, MD;  Location: [facility];  Service: Endoscopy;  Laterality: N/A;   Family History  Problem Relation Age of Onset  . Cancer Neg Hx    History   Social History  . Marital Status: Single    Spouse Name: N/A    Number of Children: 0  . Years of Education: N/A   Occupational History  .     Social History Main Topics  . Smoking status: Former Smoker    Types: Cigarettes    Quit date: 08/09/2005  . Smokeless tobacco: Never  Used  . Alcohol Use: No  . Drug Use: Yes     Comment: Pt denies but history of Cocaine and Marijuana use.   [name] Sexually Active: Yes    Birth Control/ Protection: Condom   Other Topics Concern  . Not on file   Social History Narrative   Lives with her grandmother.    Physical Exam:  BP 135/73  Temp(Src) 98.2 F (36.8 C) (Oral)  Wt 162 lb (73.483 kg)  BMI 28.7 kg/m2 General appearance: Chronically ill appearing, drowsy Eyes: scleral icterus Mouth: Oral mucosa dry  Lungs: clear to auscultation bilaterally Heart: regular rate and rhythm, S1, S2 normal, no murmur, click, rub or gallop Abdomen: +mild ascites, no tenderness to palpation +BS Extremities: trace edema Pulses: 2+ and symmetric Neurologic: In and out of consciousness, with rapid eye movements and left sided myoclonic moving when not conscious. LE reflexes 2+ and symmetric when conscious.   EMS arrived and she regained consciousness again.  She will be transported to [facility] ER for further evaluation.  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

## 2012-11-03 NOTE — Progress Notes (Signed)
Patient ID: [name], female   DOB: [birth year], 27 y.o.   MRN: [id number]  Called to room (preceptor today) with concern as patient seizing.  Patient lying on exam table, rhythmic movements to Left hand and foot, eyes rolled back in head.  Would not respond to voice or sternal rub.  Did not lose continence.  Respirate 35-40. Pulse 1:15. Oxygen saturation 90% on room air. Grade 3/6 systolic murmur noted. Lungs clear BL anterior chest.  Abdomen slightly distended. No fluid wave. Trace lower extremity edema. Neurological exam revealed pupils equal bilaterally and reactive to light. Otherwise patient not responsive.  Rhythmic motion left hand and left foot. Clonus +3 bilaterally.  Lasted about 2 minutes total.  Afterwards, [name] able to speak coherently with [place].  Alert and oriented to person and place.    Drove herself to clinic today secondary nausea and vomiting for past several days.  Had episode of coffee-ground emesis 2 days ago after eating at his restaurant. This lasted for afternoon the results. Return again last night. 6-7 episodes of coffee-ground emesis along with emesis of food. Has continued this morning therefore she presented to care. The last thing she remembers is having her blood pressure taken while sitting on the examination table.  CBG was 127. She reports being able to tolerate by mouth liquids for the past several days.  After talking with [place] she reported chest pain and numbness and then became unresponsive again. This is about 30 seconds prior to EMS arrival. She would respond to sternal rub. Breathing about 20 times per minute with continuous pulse ox at 100% on room air. Pulse 92.  At that point EMS arrived and was able to continue conversation with [name].  Took her to ED.

## 2012-11-03 NOTE — Telephone Encounter (Signed)
Had vomiting episode before and had to receive fluids.  Has cirrhosis.  Tried to eat Jello, bananas, and unable to keep anything down.  Requesting appt for evaluation or if needs to go to ED.  Appt scheduled with [facility] this morning and patient will be here by 11:15 am.  Gaylene Brooks, RN

## 2012-11-03 NOTE — ED Notes (Signed)
Pt. From family practice, witnessed tonic colonic seizure lasting 2 min. Initial c/o N/V.

## 2012-11-06 ENCOUNTER — Encounter: Payer: Self-pay | Admitting: Gastroenterology

## 2012-11-06 ENCOUNTER — Ambulatory Visit (INDEPENDENT_AMBULATORY_CARE_PROVIDER_SITE_OTHER): Payer: Medicare Other | Admitting: Gastroenterology

## 2012-11-06 VITALS — BP 124/60 | HR 104 | Ht 63.25 in | Wt 157.2 lb

## 2012-11-06 DIAGNOSIS — R109 Unspecified abdominal pain: Secondary | ICD-10-CM

## 2012-11-06 DIAGNOSIS — I8500 Esophageal varices without bleeding: Secondary | ICD-10-CM

## 2012-11-06 DIAGNOSIS — K7460 Unspecified cirrhosis of liver: Secondary | ICD-10-CM

## 2012-11-06 DIAGNOSIS — R112 Nausea with vomiting, unspecified: Secondary | ICD-10-CM

## 2012-11-06 NOTE — Assessment & Plan Note (Signed)
The patient has had intermittent nausea and vomiting over the past 6 weeks. Etiology is uncertain. Gastroparesis should be ruled out.  Active peptic ulcer disease is less likely in the face of PPI therapy.  Recommendations #1 gastric emptying scan. Patient will hold Reglan prior to the scan

## 2012-11-06 NOTE — Assessment & Plan Note (Signed)
Plan to continue nadolol. Followup endoscopy in December, 2014

## 2012-11-06 NOTE — Progress Notes (Signed)
History of Present Illness:  Ms. Hartung has returned for evaluation of nausea and vomiting. She was seen in February for similar symptoms and was briefly hospitalized for dehydration. She's had intermittent nausea and vomiting. She also has had some upper abdominal pain.  Symptoms were especially severe 2 days ago. They have subsided after taking Zofran and Reglan.     Review of Systems: Pertinent positive and negative review of systems were noted in the above HPI section. All other review of systems were otherwise negative.    Current Medications, Allergies, Past Medical History, Past Surgical History, Family History and Social History were reviewed in [employer] [facility] electronic medical record  Vital signs were reviewed in today's medical record. Physical Exam: General: Well developed , well nourished, no acute distress Skin: icteric Head: Normocephalic and atraumatic Eyes:  sclerae icteric, EOMI Ears: Normal auditory acuity Mouth: No deformity or lesions Lungs: Clear throughout to auscultation Heart: Regular rate and rhythm; no murmurs, rubs or bruits Abdomen: Soft, non tender and non distended. No masses, hepatosplenomegaly or hernias noted. Normal Bowel sounds. There is no succussion splash Rectal:deferred Musculoskeletal: Symmetrical with no gross deformities  Pulses:  Normal pulses noted Extremities: No clubbing, cyanosis, edema or deformities noted Neurological: Alert oriented x 4, grossly nonfocal Psychological:  Alert and cooperative. Normal mood and affect

## 2012-11-06 NOTE — Assessment & Plan Note (Signed)
Stable hepatic function with persistent jaundice. In view of substance abuse patient is not considered an active candidate in the transplant program.  Recommendations #1 continue Imuran

## 2012-11-06 NOTE — Patient Instructions (Addendum)
You have been scheduled for a gastric emptying scan at [facility]  on  4/16   At  10 am. Please arrive at least 15 minutes prior to your appointment for registration. Please make certain not to have anything to eat or drink after midnight the night before your test. Hold all stomach medications (ex: Zofran, phenergan, Reglan) 48 hours prior to your test. If you need to reschedule your appointment, please contact radiology scheduling at [phone]. _____________________________________________________________________ A gastric-emptying study measures how long it takes for food to move through your stomach. There are several ways to measure stomach emptying. In the most common test, you eat food that contains a small amount of radioactive material. A scanner that detects the movement of the radioactive material is placed over your abdomen to monitor the rate at which food leaves your stomach. This test normally takes about 2 hours to complete. _____________________________________________________________________    Hold Reglan 18 hours before test

## 2012-11-10 ENCOUNTER — Ambulatory Visit: Payer: Medicare Other | Admitting: Oncology

## 2012-11-10 ENCOUNTER — Telehealth: Payer: Self-pay | Admitting: Oncology

## 2012-11-10 NOTE — Telephone Encounter (Signed)
Pt called and r/s appt for today to 12/08/12 and nurse notified

## 2012-11-15 ENCOUNTER — Other Ambulatory Visit: Payer: Self-pay | Admitting: Family Medicine

## 2012-11-16 ENCOUNTER — Other Ambulatory Visit: Payer: Self-pay | Admitting: Family Medicine

## 2012-11-21 ENCOUNTER — Emergency Department (HOSPITAL_COMMUNITY)
Admission: EM | Admit: 2012-11-21 | Discharge: 2012-11-21 | Disposition: A | Discharge status: Home / Self Care | Payer: Medicare Other | Attending: Emergency Medicine | Admitting: Emergency Medicine

## 2012-11-21 ENCOUNTER — Encounter (HOSPITAL_COMMUNITY): Payer: Self-pay | Admitting: Emergency Medicine

## 2012-11-21 DIAGNOSIS — R109 Unspecified abdominal pain: Secondary | ICD-10-CM | POA: Insufficient documentation

## 2012-11-21 DIAGNOSIS — G40909 Epilepsy, unspecified, not intractable, without status epilepticus: Secondary | ICD-10-CM | POA: Insufficient documentation

## 2012-11-21 DIAGNOSIS — Z3202 Encounter for pregnancy test, result negative: Secondary | ICD-10-CM | POA: Insufficient documentation

## 2012-11-21 DIAGNOSIS — F1910 Other psychoactive substance abuse, uncomplicated: Secondary | ICD-10-CM | POA: Insufficient documentation

## 2012-11-21 DIAGNOSIS — Z87891 Personal history of nicotine dependence: Secondary | ICD-10-CM | POA: Insufficient documentation

## 2012-11-21 DIAGNOSIS — Z8679 Personal history of other diseases of the circulatory system: Secondary | ICD-10-CM | POA: Insufficient documentation

## 2012-11-21 DIAGNOSIS — Z79899 Other long term (current) drug therapy: Secondary | ICD-10-CM | POA: Insufficient documentation

## 2012-11-21 DIAGNOSIS — Z8719 Personal history of other diseases of the digestive system: Secondary | ICD-10-CM | POA: Insufficient documentation

## 2012-11-21 DIAGNOSIS — N390 Urinary tract infection, site not specified: Secondary | ICD-10-CM | POA: Insufficient documentation

## 2012-11-21 LAB — URINALYSIS, MICROSCOPIC ONLY
Glucose, UA: NEGATIVE mg/dL
Hgb urine dipstick: NEGATIVE
Ketones, ur: NEGATIVE mg/dL
Nitrite: POSITIVE — AB
Protein, ur: NEGATIVE mg/dL
Specific Gravity, Urine: 1.028 (ref 1.005–1.030)
Urobilinogen, UA: 4 mg/dL — ABNORMAL HIGH (ref 0.0–1.0)
pH: 6 (ref 5.0–8.0)

## 2012-11-21 LAB — CBC WITH DIFFERENTIAL/PLATELET
Basophils Absolute: 0 10*3/uL (ref 0.0–0.1)
Basophils Relative: 1 % (ref 0–1)
Eosinophils Absolute: 0.1 10*3/uL (ref 0.0–0.7)
Eosinophils Relative: 1 % (ref 0–5)
HCT: 27.1 % — ABNORMAL LOW (ref 36.0–46.0)
Hemoglobin: 9.7 g/dL — ABNORMAL LOW (ref 12.0–15.0)
Lymphocytes Relative: 8 % — ABNORMAL LOW (ref 12–46)
Lymphs Abs: 0.4 10*3/uL — ABNORMAL LOW (ref 0.7–4.0)
MCH: 37.6 pg — ABNORMAL HIGH (ref 26.0–34.0)
MCHC: 35.8 g/dL (ref 30.0–36.0)
MCV: 105 fL — ABNORMAL HIGH (ref 78.0–100.0)
Monocytes Absolute: 0.3 10*3/uL (ref 0.1–1.0)
Monocytes Relative: 6 % (ref 3–12)
Neutro Abs: 3.8 10*3/uL (ref 1.7–7.7)
Neutrophils Relative %: 84 % — ABNORMAL HIGH (ref 43–77)
Platelets: 43 10*3/uL — ABNORMAL LOW (ref 150–400)
RBC: 2.58 MIL/uL — ABNORMAL LOW (ref 3.87–5.11)
RDW: 14.1 % (ref 11.5–15.5)
WBC: 4.6 10*3/uL (ref 4.0–10.5)

## 2012-11-21 LAB — PROTIME-INR
INR: 1.5 — ABNORMAL HIGH (ref 0.00–1.49)
Prothrombin Time: 17.7 seconds — ABNORMAL HIGH (ref 11.6–15.2)

## 2012-11-21 LAB — COMPREHENSIVE METABOLIC PANEL
ALT: 85 U/L — ABNORMAL HIGH (ref 0–35)
AST: 165 U/L — ABNORMAL HIGH (ref 0–37)
Albumin: 1.8 g/dL — ABNORMAL LOW (ref 3.5–5.2)
Alkaline Phosphatase: 622 U/L — ABNORMAL HIGH (ref 39–117)
BUN: 14 mg/dL (ref 6–23)
CO2: 26 mEq/L (ref 19–32)
Calcium: 8.2 mg/dL — ABNORMAL LOW (ref 8.4–10.5)
Chloride: 94 mEq/L — ABNORMAL LOW (ref 96–112)
Creatinine, Ser: 0.76 mg/dL (ref 0.50–1.10)
GFR calc Af Amer: >90 mL/min (ref >90)
GFR calc non Af Amer: >90 mL/min (ref >90)
Glucose, Bld: 116 mg/dL — ABNORMAL HIGH (ref 70–99)
Potassium: 3.9 mEq/L (ref 3.5–5.1)
Sodium: 128 mEq/L — ABNORMAL LOW (ref 135–145)
Total Bilirubin: 13.2 mg/dL — ABNORMAL HIGH (ref 0.3–1.2)
Total Protein: 6.2 g/dL (ref 6.0–8.3)

## 2012-11-21 LAB — POCT PREGNANCY, URINE: Preg Test, Ur: NEGATIVE

## 2012-11-21 LAB — LIPASE, BLOOD: Lipase: 22 U/L (ref 11–59)

## 2012-11-21 MED ORDER — ONDANSETRON HCL 4 MG/2ML IJ SOLN
4.0000 mg | Freq: Once | INTRAMUSCULAR | Status: AC
  Administered 2012-11-21: 4 mg via INTRAVENOUS
  Filled 2012-11-21: qty 2

## 2012-11-21 MED ORDER — DIPHENHYDRAMINE HCL 50 MG/ML IJ SOLN
25.0000 mg | Freq: Once | INTRAMUSCULAR | Status: AC
  Administered 2012-11-21: 25 mg via INTRAVENOUS
  Filled 2012-11-21: qty 1

## 2012-11-21 MED ORDER — CIPROFLOXACIN HCL 500 MG PO TABS: 500.0000 mg | ORAL_TABLET | Freq: Two times a day (BID) | ORAL | Status: DC

## 2012-11-21 MED ORDER — CIPROFLOXACIN IN D5W 400 MG/200ML IV SOLN
400.0000 mg | Freq: Once | INTRAVENOUS | Status: AC
  Administered 2012-11-21: 400 mg via INTRAVENOUS
  Filled 2012-11-21: qty 200

## 2012-11-21 MED ORDER — MORPHINE SULFATE 4 MG/ML IJ SOLN
6.0000 mg | Freq: Once | INTRAMUSCULAR | Status: AC
  Administered 2012-11-21: 6 mg via INTRAVENOUS
  Filled 2012-11-21: qty 2

## 2012-11-21 MED ORDER — MORPHINE SULFATE 4 MG/ML IJ SOLN
4.0000 mg | Freq: Once | INTRAMUSCULAR | Status: AC
  Administered 2012-11-21: 4 mg via INTRAVENOUS
  Filled 2012-11-21: qty 1

## 2012-11-21 MED ORDER — HYDROMORPHONE HCL PF 1 MG/ML IJ SOLN
1.0000 mg | Freq: Once | INTRAMUSCULAR | Status: AC
  Administered 2012-11-21: 1 mg via INTRAVENOUS
  Filled 2012-11-21: qty 1

## 2012-11-21 NOTE — ED Notes (Signed)
Pt arrived via EMS with bilateral jaundice and bilateral leg edema.

## 2012-11-21 NOTE — ED Notes (Signed)
Per EMS: Pt c/o abd pain, nausea, vomiting, swollen legs, decreased urine output since yesterday.  Pt takes lasix.

## 2012-11-21 NOTE — ED Notes (Signed)
[id number]<AV> Expected date:<BR> Expected time:<BR> Means of arrival:<BR> Comments:<BR> Ems/ 27 nausea, liver chrosis

## 2012-11-22 ENCOUNTER — Encounter (HOSPITAL_COMMUNITY): Payer: Medicare Other

## 2012-11-22 LAB — URINE CULTURE: Colony Count: 5000

## 2012-11-23 NOTE — ED Provider Notes (Signed)
History    27yF with crampy lower abdominal pain. Unfortunate hx of autoimmune hepatitis, PSC.  Abdominal pain gradual onset. No appreciable exacerbating or relieving factors. Urinary urgency. NO fever or chills. Nausea, but no vomiting. No unusual vaginal bleeding or discharge. LE swollen. Chronic but feels like worsening.  CSN: [id number]  Arrival date & time 11/21/12  4098   First MD Initiated Contact with Patient 11/21/12 1006      Chief Complaint  Patient presents with  . Abdominal Pain    (Consider location/radiation/quality/duration/timing/severity/associated sxs/prior treatment) HPI  Past Medical History  Diagnosis Date  . Substance abuse April 2013    Per [facility] notes - History of cocaine plus THC usage - limits liver transplant options  . Esophageal varices   . Cirrhosis of liver   . Autoimmune hepatitis     Confirmed via liver biopsy 2002  . PSC (primary sclerosing cholangitis)   . Seizures     Past Surgical History  Procedure Laterality Date  . Esophagogastroduodenoscopy  07/18/2012    Procedure: ESOPHAGOGASTRODUODENOSCOPY (EGD);  Surgeon: Louis Meckel, MD;  Location: [facility];  Service: Endoscopy;  Laterality: N/A;  . Gastric varices banding  07/18/2012    Procedure: GASTRIC VARICES BANDING;  Surgeon: Louis Meckel, MD;  Location: [facility];  Service: Endoscopy;  Laterality: N/A;    Family History  Problem Relation Age of Onset  . Cancer Neg Hx     History  Substance Use Topics  . Smoking status: Former Smoker    Types: Cigarettes    Quit date: 08/09/2005  . Smokeless tobacco: Never Used  . Alcohol Use: No    OB History   Grav Para Term Preterm Abortions TAB SAB Ect Mult Living                  Review of Systems  All systems reviewed and negative, other than as noted in HPI.  Allergies  Ibuprofen  Home Medications   Current Outpatient Rx  Name  Route  Sig  Dispense  Refill  . azaTHIOprine (IMURAN) 50 MG tablet   Oral   Take  50 mg by mouth daily.          [name] CARAFATE 1 GM/10ML suspension      take 10 milliliters by mouth twice a day if needed for stomach pain   420 mL   0   . furosemide (LASIX) 40 MG tablet   Oral   Take 120 mg by mouth 2 (two) times daily.         [name] HYDROcodone-acetaminophen (NORCO) 5-325 MG per tablet   Oral   Take 1-2 tablets by mouth every 6 (six) hours as needed for pain.   30 tablet   0   . KLOR-CON M20 20 MEQ tablet      take 1 tablet by mouth twice a day   60 tablet   0   . Lactulose 20 GM/30ML SOLN   Oral   Take 30 mLs (20 g total) by mouth daily as needed. For constipation   500 mL   0   . levETIRAcetam (KEPPRA) 750 MG tablet   Oral   Take 750 mg by mouth daily. Patient is supposed to take twice daily but only takes once         . metoCLOPramide (REGLAN) 10 MG tablet      take 1 tablet by mouth four times a day   90 tablet   0   . nadolol (  CORGARD) 20 MG tablet   Oral   Take 20 mg by mouth at bedtime.          . ondansetron (ZOFRAN-ODT) 4 MG disintegrating tablet   Oral   Take 4 mg by mouth every 6 (six) hours as needed. For nausea         . pantoprazole (PROTONIX) 40 MG tablet   Oral   Take 40 mg by mouth 2 (two) times daily.         . promethazine (PHENERGAN) 25 MG suppository   Rectal   Place 25 mg rectally every 6 (six) hours as needed. For nausea         . spironolactone (ALDACTONE) 100 MG tablet   Oral   Take 1 tablet (100 mg total) by mouth daily.   60 tablet   0   . ciprofloxacin (CIPRO) 500 MG tablet   Oral   Take 1 tablet (500 mg total) by mouth 2 (two) times daily.   10 tablet   0   . EPINEPHrine (EPI-PEN) 0.3 mg/0.3 mL DEVI   Intramuscular   Inject 0.3 mg into the muscle once.           BP 129/65  Pulse 99  Temp(Src) 98.8 F (37.1 C) (Oral)  Resp 18  SpO2 100%  Physical Exam  Nursing note and vitals reviewed. Constitutional:  Tired appearing, but not toxic.   HENT:  Head: Normocephalic and  atraumatic.  Eyes: Pupils are equal, round, and reactive to light. Right eye exhibits no discharge. Left eye exhibits no discharge. Scleral icterus is present.  Neck: Neck supple.  Cardiovascular: Normal rate, regular rhythm and normal heart sounds.  Exam reveals no gallop and no friction rub.   No murmur heard. Pulmonary/Chest: Effort normal and breath sounds normal. No respiratory distress.  Abdominal: Soft. She exhibits no distension. There is tenderness. There is no rebound and no guarding.  Mild diffuse abdominal tenderness w/o rebound or guarding.   Genitourinary:  No cva tenderness  Musculoskeletal: She exhibits no edema and no tenderness.  Symmetric LE edema  Neurological: She is alert.  Skin: Skin is warm and dry.  Psychiatric: She has a normal mood and affect. Her behavior is normal. Thought content normal.    ED Course  Procedures (including critical care time)  Labs Reviewed  CBC WITH DIFFERENTIAL - Abnormal; Notable for the following:    RBC 2.58 (*)    Hemoglobin 9.7 (*)    HCT 27.1 (*)    MCV 105.0 (*)    MCH 37.6 (*)    Platelets 43 (*)    Neutrophils Relative 84 (*)    Lymphocytes Relative 8 (*)    Lymphs Abs 0.4 (*)    All other components within normal limits  COMPREHENSIVE METABOLIC PANEL - Abnormal; Notable for the following:    Sodium 128 (*)    Chloride 94 (*)    Glucose, Bld 116 (*)    Calcium 8.2 (*)    Albumin 1.8 (*)    AST 165 (*)    ALT 85 (*)    Alkaline Phosphatase 622 (*)    Total Bilirubin 13.2 (*)    All other components within normal limits  URINALYSIS, MICROSCOPIC ONLY - Abnormal; Notable for the following:    Color, Urine ORANGE (*)    APPearance CLOUDY (*)    Bilirubin Urine LARGE (*)    Urobilinogen, UA 4.0 (*)    Nitrite POSITIVE (*)    Leukocytes, UA  SMALL (*)    Bacteria, UA MANY (*)    Squamous Epithelial / LPF MANY (*)    All other components within normal limits  PROTIME-INR - Abnormal; Notable for the following:     Prothrombin Time 17.7 (*)    INR 1.50 (*)    All other components within normal limits  URINE CULTURE  LIPASE, BLOOD  POCT PREGNANCY, URINE   No results found.   1. Abdominal pain   2. UTI (urinary tract infection)       MDM  27yf with abdominal pain. Suspect from UTI. Multiple laboratory abnormalities but keeping with her PMHx and at/near baseline. Afebrile and HD stable. Symptoms controlled. Dose IV abx given prior to DC. Script for continued.         Raeford Razor, MD 11/23/12 1550

## 2012-11-26 ENCOUNTER — Inpatient Hospital Stay (HOSPITAL_COMMUNITY)
Admission: EM | Admit: 2012-11-26 | Discharge: 2012-11-28 | DRG: 392 | Disposition: A | Discharge status: Home / Self Care | Payer: Medicare Other | Attending: Family Medicine | Admitting: Family Medicine

## 2012-11-26 ENCOUNTER — Encounter (HOSPITAL_COMMUNITY): Payer: Self-pay | Admitting: Emergency Medicine

## 2012-11-26 ENCOUNTER — Emergency Department (HOSPITAL_COMMUNITY): Payer: Medicare Other

## 2012-11-26 DIAGNOSIS — K754 Autoimmune hepatitis: Secondary | ICD-10-CM

## 2012-11-26 DIAGNOSIS — K3184 Gastroparesis: Secondary | ICD-10-CM | POA: Diagnosis present

## 2012-11-26 DIAGNOSIS — T451X5A Adverse effect of antineoplastic and immunosuppressive drugs, initial encounter: Secondary | ICD-10-CM | POA: Diagnosis present

## 2012-11-26 DIAGNOSIS — K279 Peptic ulcer, site unspecified, unspecified as acute or chronic, without hemorrhage or perforation: Secondary | ICD-10-CM | POA: Diagnosis present

## 2012-11-26 DIAGNOSIS — R112 Nausea with vomiting, unspecified: Secondary | ICD-10-CM

## 2012-11-26 DIAGNOSIS — K8301 Primary sclerosing cholangitis: Secondary | ICD-10-CM

## 2012-11-26 DIAGNOSIS — G8929 Other chronic pain: Secondary | ICD-10-CM | POA: Diagnosis present

## 2012-11-26 DIAGNOSIS — G40909 Epilepsy, unspecified, not intractable, without status epilepticus: Secondary | ICD-10-CM | POA: Diagnosis present

## 2012-11-26 DIAGNOSIS — I8510 Secondary esophageal varices without bleeding: Secondary | ICD-10-CM | POA: Diagnosis present

## 2012-11-26 DIAGNOSIS — R1031 Right lower quadrant pain: Secondary | ICD-10-CM

## 2012-11-26 DIAGNOSIS — R8789 Other abnormal findings in specimens from female genital organs: Secondary | ICD-10-CM

## 2012-11-26 DIAGNOSIS — L678 Other hair color and hair shaft abnormalities: Secondary | ICD-10-CM

## 2012-11-26 DIAGNOSIS — I498 Other specified cardiac arrhythmias: Secondary | ICD-10-CM | POA: Diagnosis present

## 2012-11-26 DIAGNOSIS — R600 Localized edema: Secondary | ICD-10-CM

## 2012-11-26 DIAGNOSIS — L738 Other specified follicular disorders: Secondary | ICD-10-CM

## 2012-11-26 DIAGNOSIS — D6959 Other secondary thrombocytopenia: Secondary | ICD-10-CM | POA: Diagnosis present

## 2012-11-26 DIAGNOSIS — D696 Thrombocytopenia, unspecified: Secondary | ICD-10-CM | POA: Diagnosis present

## 2012-11-26 DIAGNOSIS — K7290 Hepatic failure, unspecified without coma: Secondary | ICD-10-CM

## 2012-11-26 DIAGNOSIS — F17200 Nicotine dependence, unspecified, uncomplicated: Secondary | ICD-10-CM | POA: Diagnosis present

## 2012-11-26 DIAGNOSIS — Z79899 Other long term (current) drug therapy: Secondary | ICD-10-CM

## 2012-11-26 DIAGNOSIS — K8309 Other cholangitis: Secondary | ICD-10-CM | POA: Diagnosis present

## 2012-11-26 DIAGNOSIS — K769 Liver disease, unspecified: Secondary | ICD-10-CM

## 2012-11-26 DIAGNOSIS — R1032 Left lower quadrant pain: Secondary | ICD-10-CM

## 2012-11-26 DIAGNOSIS — K7460 Unspecified cirrhosis of liver: Secondary | ICD-10-CM

## 2012-11-26 DIAGNOSIS — K2970 Gastritis, unspecified, without bleeding: Secondary | ICD-10-CM | POA: Diagnosis present

## 2012-11-26 DIAGNOSIS — IMO0002 Reserved for concepts with insufficient information to code with codable children: Secondary | ICD-10-CM | POA: Diagnosis present

## 2012-11-26 DIAGNOSIS — I629 Nontraumatic intracranial hemorrhage, unspecified: Secondary | ICD-10-CM

## 2012-11-26 DIAGNOSIS — N912 Amenorrhea, unspecified: Secondary | ICD-10-CM

## 2012-11-26 DIAGNOSIS — F1410 Cocaine abuse, uncomplicated: Secondary | ICD-10-CM

## 2012-11-26 DIAGNOSIS — I8500 Esophageal varices without bleeding: Secondary | ICD-10-CM | POA: Diagnosis present

## 2012-11-26 DIAGNOSIS — R079 Chest pain, unspecified: Secondary | ICD-10-CM

## 2012-11-26 DIAGNOSIS — I472 Ventricular tachycardia, unspecified: Secondary | ICD-10-CM

## 2012-11-26 DIAGNOSIS — R188 Other ascites: Secondary | ICD-10-CM

## 2012-11-26 DIAGNOSIS — E871 Hypo-osmolality and hyponatremia: Secondary | ICD-10-CM

## 2012-11-26 DIAGNOSIS — D649 Anemia, unspecified: Secondary | ICD-10-CM

## 2012-11-26 DIAGNOSIS — I4729 Other ventricular tachycardia: Secondary | ICD-10-CM | POA: Diagnosis present

## 2012-11-26 DIAGNOSIS — K766 Portal hypertension: Secondary | ICD-10-CM

## 2012-11-26 DIAGNOSIS — R1011 Right upper quadrant pain: Secondary | ICD-10-CM | POA: Diagnosis present

## 2012-11-26 DIAGNOSIS — K7682 Hepatic encephalopathy: Secondary | ICD-10-CM

## 2012-11-26 HISTORY — DX: Nontraumatic intracranial hemorrhage, unspecified: I62.9

## 2012-11-26 HISTORY — DX: Thrombocytopenia, unspecified: D69.6

## 2012-11-26 LAB — APTT: aPTT: 45 seconds — ABNORMAL HIGH (ref 24–37)

## 2012-11-26 LAB — BASIC METABOLIC PANEL
CO2: 26 mEq/L (ref 19–32)
Chloride: 96 mEq/L (ref 96–112)
GFR calc Af Amer: >90 mL/min (ref >90)
Sodium: 131 mEq/L — ABNORMAL LOW (ref 135–145)

## 2012-11-26 LAB — CBC WITH DIFFERENTIAL/PLATELET
Basophils Absolute: 0 10*3/uL (ref 0.0–0.1)
Eosinophils Relative: 0 % (ref 0–5)
Lymphocytes Relative: 7 % — ABNORMAL LOW (ref 12–46)
Monocytes Relative: 4 % (ref 3–12)
Neutrophils Relative %: 89 % — ABNORMAL HIGH (ref 43–77)
Platelets: 51 10*3/uL — ABNORMAL LOW (ref 150–400)
RBC: 2.79 MIL/uL — ABNORMAL LOW (ref 3.87–5.11)
RDW: 14.7 % (ref 11.5–15.5)
WBC: 6 10*3/uL (ref 4.0–10.5)

## 2012-11-26 LAB — URINALYSIS, ROUTINE W REFLEX MICROSCOPIC
Glucose, UA: NEGATIVE mg/dL
Hgb urine dipstick: NEGATIVE
Protein, ur: NEGATIVE mg/dL
pH: 7.5 (ref 5.0–8.0)

## 2012-11-26 LAB — HEPATIC FUNCTION PANEL
ALT: 75 U/L — ABNORMAL HIGH (ref 0–35)
Albumin: 1.9 g/dL — ABNORMAL LOW (ref 3.5–5.2)
Alkaline Phosphatase: 689 U/L — ABNORMAL HIGH (ref 39–117)
Indirect Bilirubin: 5 mg/dL — ABNORMAL HIGH (ref 0.3–0.9)
Total Protein: 6.3 g/dL (ref 6.0–8.3)

## 2012-11-26 LAB — LIPASE, BLOOD: Lipase: 18 U/L (ref 11–59)

## 2012-11-26 LAB — TROPONIN I: Troponin I: <0.3 ng/mL (ref <0.30)

## 2012-11-26 LAB — TYPE AND SCREEN: Antibody Screen: NEGATIVE

## 2012-11-26 LAB — RAPID URINE DRUG SCREEN, HOSP PERFORMED
Amphetamines: NOT DETECTED
Benzodiazepines: NOT DETECTED
Opiates: POSITIVE — AB
Tetrahydrocannabinol: NOT DETECTED

## 2012-11-26 LAB — POCT I-STAT TROPONIN I: Troponin i, poc: 0 ng/mL (ref 0.00–0.08)

## 2012-11-26 LAB — URINE MICROSCOPIC-ADD ON

## 2012-11-26 LAB — OCCULT BLOOD, POC DEVICE: Fecal Occult Bld: POSITIVE — AB

## 2012-11-26 LAB — PROTIME-INR: Prothrombin Time: 17.3 seconds — ABNORMAL HIGH (ref 11.6–15.2)

## 2012-11-26 LAB — MAGNESIUM: Magnesium: 1.9 mg/dL (ref 1.5–2.5)

## 2012-11-26 MED ORDER — MORPHINE SULFATE 2 MG/ML IJ SOLN
2.0000 mg | INTRAMUSCULAR | Status: DC | PRN
  Administered 2012-11-26 – 2012-11-27 (×2): 2 mg via INTRAVENOUS
  Filled 2012-11-26: qty 1
  Filled 2012-11-26: qty 2
  Filled 2012-11-26: qty 1

## 2012-11-26 MED ORDER — SODIUM CHLORIDE 0.9 % IV SOLN: 250.0000 mL | INTRAVENOUS | Status: DC | PRN

## 2012-11-26 MED ORDER — SUCRALFATE 1 GM/10ML PO SUSP
1.0000 g | Freq: Four times a day (QID) | ORAL | Status: DC
  Filled 2012-11-26 (×3): qty 10

## 2012-11-26 MED ORDER — ONDANSETRON HCL 4 MG/2ML IJ SOLN: 4.0000 mg | Freq: Four times a day (QID) | INTRAMUSCULAR | Status: DC | PRN

## 2012-11-26 MED ORDER — OXYCODONE HCL 5 MG PO TABS
5.0000 mg | ORAL_TABLET | ORAL | Status: DC | PRN
  Administered 2012-11-26 – 2012-11-27 (×3): 5 mg via ORAL
  Filled 2012-11-26 (×3): qty 1

## 2012-11-26 MED ORDER — MORPHINE SULFATE 4 MG/ML IJ SOLN
4.0000 mg | Freq: Once | INTRAMUSCULAR | Status: AC
  Administered 2012-11-26: 4 mg via INTRAVENOUS
  Filled 2012-11-26: qty 1

## 2012-11-26 MED ORDER — SODIUM CHLORIDE 0.9 % IV BOLUS (SEPSIS)
1000.0000 mL | Freq: Once | INTRAVENOUS | Status: AC
  Administered 2012-11-26: 1000 mL via INTRAVENOUS

## 2012-11-26 MED ORDER — SENNA 8.6 MG PO TABS
1.0000 | ORAL_TABLET | Freq: Two times a day (BID) | ORAL | Status: DC
  Administered 2012-11-26 – 2012-11-28 (×3): 8.6 mg via ORAL
  Filled 2012-11-26 (×5): qty 1

## 2012-11-26 MED ORDER — ACETAMINOPHEN 325 MG PO TABS: 650.0000 mg | ORAL_TABLET | Freq: Four times a day (QID) | ORAL | Status: DC | PRN

## 2012-11-26 MED ORDER — SODIUM CHLORIDE 0.9 % IV SOLN: Freq: Once | INTRAVENOUS | Status: DC

## 2012-11-26 MED ORDER — SODIUM CHLORIDE 0.9 % IV SOLN
Freq: Once | INTRAVENOUS | Status: AC
  Administered 2012-11-26: 11:00:00 via INTRAVENOUS

## 2012-11-26 MED ORDER — AZATHIOPRINE 50 MG PO TABS
50.0000 mg | ORAL_TABLET | Freq: Every day | ORAL | Status: DC
  Administered 2012-11-26 – 2012-11-28 (×3): 50 mg via ORAL
  Filled 2012-11-26 (×3): qty 1

## 2012-11-26 MED ORDER — ONDANSETRON HCL 4 MG/2ML IJ SOLN
4.0000 mg | Freq: Once | INTRAMUSCULAR | Status: AC
  Administered 2012-11-26: 4 mg via INTRAVENOUS
  Filled 2012-11-26: qty 2

## 2012-11-26 MED ORDER — NADOLOL 20 MG PO TABS
20.0000 mg | ORAL_TABLET | Freq: Every day | ORAL | Status: DC
  Administered 2012-11-26: 22:00:00 via ORAL
  Administered 2012-11-27: 20 mg via ORAL
  Filled 2012-11-26 (×3): qty 1

## 2012-11-26 MED ORDER — LEVETIRACETAM 750 MG PO TABS
750.0000 mg | ORAL_TABLET | Freq: Every day | ORAL | Status: DC
  Administered 2012-11-26 – 2012-11-27 (×2): 750 mg via ORAL
  Filled 2012-11-26 (×2): qty 1

## 2012-11-26 MED ORDER — PANTOPRAZOLE SODIUM 40 MG PO TBEC
40.0000 mg | DELAYED_RELEASE_TABLET | Freq: Every day | ORAL | Status: DC
Start: 2012-11-26 — End: 2012-11-28
  Administered 2012-11-26 – 2012-11-28 (×3): 40 mg via ORAL
  Filled 2012-11-26 (×3): qty 1

## 2012-11-26 MED ORDER — PANTOPRAZOLE SODIUM 40 MG IV SOLR
40.0000 mg | Freq: Once | INTRAVENOUS | Status: AC
  Administered 2012-11-26: 40 mg via INTRAVENOUS

## 2012-11-26 MED ORDER — FUROSEMIDE 80 MG PO TABS
120.0000 mg | ORAL_TABLET | Freq: Two times a day (BID) | ORAL | Status: DC
  Administered 2012-11-26 – 2012-11-27 (×2): 120 mg via ORAL
  Filled 2012-11-26 (×6): qty 1

## 2012-11-26 MED ORDER — DIPHENHYDRAMINE HCL 25 MG PO CAPS
25.0000 mg | ORAL_CAPSULE | Freq: Four times a day (QID) | ORAL | Status: DC | PRN
  Administered 2012-11-26 – 2012-11-27 (×2): 25 mg via ORAL
  Filled 2012-11-26 (×2): qty 1

## 2012-11-26 MED ORDER — SODIUM CHLORIDE 0.9 % IJ SOLN
3.0000 mL | Freq: Two times a day (BID) | INTRAMUSCULAR | Status: DC
  Administered 2012-11-26 – 2012-11-28 (×3): 3 mL via INTRAVENOUS

## 2012-11-26 MED ORDER — ACETAMINOPHEN 650 MG RE SUPP: 650.0000 mg | Freq: Four times a day (QID) | RECTAL | Status: DC | PRN

## 2012-11-26 MED ORDER — BISACODYL 10 MG RE SUPP: 10.0000 mg | Freq: Every day | RECTAL | Status: DC | PRN

## 2012-11-26 MED ORDER — SODIUM CHLORIDE 0.9 % IJ SOLN: 3.0000 mL | INTRAMUSCULAR | Status: DC | PRN

## 2012-11-26 MED ORDER — PANTOPRAZOLE SODIUM 40 MG IV SOLR
40.0000 mg | Freq: Once | INTRAVENOUS | Status: DC
  Filled 2012-11-26: qty 40

## 2012-11-26 MED ORDER — SODIUM CHLORIDE 0.9 % IJ SOLN
3.0000 mL | Freq: Two times a day (BID) | INTRAMUSCULAR | Status: DC
  Administered 2012-11-27: 3 mL via INTRAVENOUS

## 2012-11-26 MED ORDER — SUCRALFATE 1 GM/10ML PO SUSP
1.0000 g | Freq: Three times a day (TID) | ORAL | Status: DC
  Administered 2012-11-26 – 2012-11-28 (×5): 1 g via ORAL
  Filled 2012-11-26 (×10): qty 10

## 2012-11-26 MED ORDER — SPIRONOLACTONE 100 MG PO TABS
100.0000 mg | ORAL_TABLET | Freq: Every day | ORAL | Status: DC
  Administered 2012-11-26 – 2012-11-28 (×3): 100 mg via ORAL
  Filled 2012-11-26 (×3): qty 1

## 2012-11-26 MED ORDER — ONDANSETRON HCL 4 MG PO TABS: 4.0000 mg | ORAL_TABLET | Freq: Four times a day (QID) | ORAL | Status: DC | PRN

## 2012-11-26 MED ORDER — MORPHINE SULFATE 2 MG/ML IJ SOLN
1.0000 mg | INTRAMUSCULAR | Status: DC | PRN
  Administered 2012-11-26: 1 mg via INTRAVENOUS
  Filled 2012-11-26: qty 1

## 2012-11-26 NOTE — H&P (Signed)
Family Medicine Teaching [facility] Admission History and Physical Service Pager: [phone]  Patient name: [name] Medical record number: [id number] Date of birth: [birth year] Age: 27 y.o. Gender: female  Primary Care Provider: Lloyd Huger, MD  Chief Complaint: abdominal pain,   Assessment and Plan: [name] is a 27 y.o. year old female presenting with nausea vomiting and recurrent abd pain acutely worsened for 1 day but that has persisted for several months. With her report of dark colored   Nausea, vomiting, abd pain - Controlled now with zofran currently, pain improved with morphine from [facility] - Gastrits vs PUD, pancreatitis unlikely with lipase of 18 - With Hx esophageal varices and reported dark emesis there is a concern for UGIB.   - Last EGD 12/13 with grade 1 esophageal varices - Hgb stable from 5 days ago 9.7-->10.5, possibly representing hemoconcentration - Consider GI consult if hematemesis continues and patient is documented Gastroccult positive.   - PRN zofran, clear liquids - Continue nadolol - Monitor Hgb and recurrence  Primary sclerosis cholangitis, Non-alcoholic Cirrhosis - hemodynamically stable, labs improved from 11/03/2012 labs - Elevated alk phos to 689, TBil 12.1, AT 145, ALT 75 - Albumin 1.9, INR 1.4 - Meld score of 20 indicating 80-100% 3 months survival - continue Imuran - CMP tomorrow Am, daily INR  Chest pain - Typical - VTach in [facility] ED examined by Cards here and feel it is artifact superimposed on sinus tach- they have signed off - EKG - It does sound typical, so will cycle troponins - monitor on tele and for resolution/recurrence  Thrombocytopenia - likely cirrhoisis related - Stable around 50 K - avoid heparin  Seizure d/o - continue home dose Keppra  Peripheral edema - trace on exam today - likely due to low osmotic pressure from decreased hepatic synthetic function.  - continue lasix- 120 BID  FEN/GI: Clears fo rnow,  advance if tolerated, KVO fluids Prophylaxis: SCDs, avoiding heparin considering thrombocytopenia Disposition: Tele, home when clinically improved Code Status: Full  History of Present Illness: [name] is a 27 y.o. year old female presenting with nasuea and vomiting acutely worse for one day that has been present for several months. Her shooting/aching constant pain started at 5pm last night and is described as mid-epigastric pain that radiates Right and Left worsened by emesis. She additionally has L sided neck pain associated with the abd pain. She last ate shrimp and a baked potato last night around 5pm. She describes her emesis as dark green with black coffee beans in it and sates that it has been this way for several months. She see's Dr. Arlyce Dice for OP GI and says she has an EGD scheduled for may. She has also had some chills and lower extremity swelling which responds well to elevating her feet. She denies fevers.   She mentions 1 day of constipation with hard black stools yesterday. She took a dose of lactulose which did not help. She notes that she had blood on her toilet paper but not on her stool, in her stol , or in the water.   She had an event in the [facility] for less than a minute where she describes chest pain described as 10/10 tightness which was non radiating, L sided, and has now resolved. She describes associated dyspnea ast that time as well as continue nausea.    She is scheduled to see a transplant doctor in [place] but hasnt been able to get an appt. She denies ethanol, tobacco,  and illicit drug use. She says she last used cocaine 2 weeks ago.     Patient Active Problem List  Diagnosis  . THROMBOCYTOPENIA  . TOBACCO DEPENDENCE  . INTRACRANIAL HEMORRHAGE  . ESOPHAGEAL VARICES  . HEPATIC CIRRHOSIS, NONALCOHOLIC  . ENCEPHALOPATHY-HEPATIC  . LIVER FAILURE  . AMENORRHEA  . OTHER SPECIFIED DISEASE OF HAIR&HAIR FOLLICLES  . PAPANICOLAOU SMEAR, ABNORMAL  . PORTAL  HYPERTENSION  . Seizure disorder  . Edema leg  . Nausea and vomiting in adult  . Hyponatremia  . Autoimmune hepatitis  . Ascites  . Sclerosing cholangitis  . Bilateral lower abdominal pain  . Anemia  . Otitis media, purulent, acute   Past Medical History: Past Medical History  Diagnosis Date  . Substance abuse April 2013    Per [facility] notes - History of cocaine plus THC usage - limits liver transplant options  . Esophageal varices   . Cirrhosis of liver   . Autoimmune hepatitis     Confirmed via liver biopsy 2002  . PSC (primary sclerosing cholangitis)   . Seizures    Past Surgical History: Past Surgical History  Procedure Laterality Date  . Esophagogastroduodenoscopy  07/18/2012    Procedure: ESOPHAGOGASTRODUODENOSCOPY (EGD);  Surgeon: Louis Meckel, MD;  Location: [facility];  Service: Endoscopy;  Laterality: N/A;  . Gastric varices banding  07/18/2012    Procedure: GASTRIC VARICES BANDING;  Surgeon: Louis Meckel, MD;  Location: [facility];  Service: Endoscopy;  Laterality: N/A;   Social History: History  Substance Use Topics  . Smoking status: Former Smoker    Types: Cigarettes    Quit date: 08/09/2005  . Smokeless tobacco: Never Used  . Alcohol Use: No   For any additional social history documentation, please refer to relevant sections of EMR.  Family History: Family History  Problem Relation Age of Onset  . Cancer Neg Hx    Allergies: Allergies  Allergen Reactions  . Ibuprofen Shortness Of Breath   No current facility-administered medications on file prior to encounter.   Current Outpatient Prescriptions on File Prior to Encounter  Medication Sig Dispense Refill  . azaTHIOprine (IMURAN) 50 MG tablet Take 50 mg by mouth daily.       [name] CARAFATE 1 GM/10ML suspension take 10 milliliters by mouth twice a day if needed for stomach pain  420 mL  0  . ciprofloxacin (CIPRO) 500 MG tablet Take 1 tablet (500 mg total) by mouth 2 (two) times daily.  10 tablet   0  . EPINEPHrine (EPI-PEN) 0.3 mg/0.3 mL DEVI Inject 0.3 mg into the muscle once.      . furosemide (LASIX) 40 MG tablet Take 120 mg by mouth 2 (two) times daily.      [name] HYDROcodone-acetaminophen (NORCO) 5-325 MG per tablet Take 1-2 tablets by mouth every 6 (six) hours as needed for pain.  30 tablet  0  . KLOR-CON M20 20 MEQ tablet take 1 tablet by mouth twice a day  60 tablet  0  . Lactulose 20 GM/30ML SOLN Take 30 mLs (20 g total) by mouth daily as needed. For constipation  500 mL  0  . levETIRAcetam (KEPPRA) 750 MG tablet Take 750 mg by mouth daily. Patient is supposed to take twice daily but only takes once      . metoCLOPramide (REGLAN) 10 MG tablet take 1 tablet by mouth four times a day  90 tablet  0  . nadolol (CORGARD) 20 MG tablet Take 20 mg by mouth at  bedtime.       . ondansetron (ZOFRAN-ODT) 4 MG disintegrating tablet Take 4 mg by mouth every 6 (six) hours as needed. For nausea      . pantoprazole (PROTONIX) 40 MG tablet Take 40 mg by mouth daily.       . promethazine (PHENERGAN) 25 MG suppository Place 25 mg rectally every 6 (six) hours as needed. For nausea      . spironolactone (ALDACTONE) 100 MG tablet Take 1 tablet (100 mg total) by mouth daily.  60 tablet  0   Review Of Systems: Per HPI   Physical Exam: BP 124/68  Pulse 99  Temp(Src) 98.1 F (36.7 C) (Oral)  Resp 18  Ht 5\' 1"  (1.549 m)  Wt 149 lb 9.6 oz (67.858 kg)  BMI 28.28 kg/m2  SpO2 98% Exam: Gen: NAD, alert, cooperative with exam, very comfortable appearing HEENT: NCAT, EOMI, pupils 2-3 mm and sluggish to react, icteric sclera CV: RRR, good S1/S2, 3/6 systolic murmur loudest at the R sternal border/aortic area Resp: CTABL, no wheezes, non-labored Abd: Soft mild tenderness to palpation of LLq with moderately deep palpation, ND, BS present, no guarding or organomegaly, no rebound, stretch marks apparent around abdomen Rectal: no external skin tags. Normal rectal tone. Anoscopic exam negative for fissures and  internal hemorrhoids.  Ext: No edema, warm Neuro: Alert and oriented, No gross deficits  Labs and Imaging:  Recent Labs Lab 11/21/12 1005 11/26/12 1019  NA 128* 131*  K 3.9 4.1  CL 94* 96  CO2 26 26  GLUCOSE 116* 96  BUN 14 10  CREATININE 0.76 0.80  CALCIUM 8.2* 8.7  MG  --  1.9     Recent Labs Lab 11/21/12 1005 11/26/12 1019  HGB 9.7* 10.5*  HCT 27.1* 30.0*  WBC 4.6 6.0  PLT 43* 51*     Recent Labs Lab 11/21/12 1005 11/26/12 1019  AST 165* 144*  ALT 85* 75*  ALKPHOS 622* 689*  BILITOT 13.2* 12.1*  PROT 6.2 6.3  ALBUMIN 1.8* 1.9*  INR 1.50* 1.46   Utox Positive for Opiates and cocaine FOBT positive UPreg negative   Kevin Fenton, MD 11/26/2012, 3:45 PM  I examined the patient with Dr. Ermalinda Memos. I have reviewed the note, made necessary revisions and agree with above.  Lillianah Swartzentruber 11/26/12, 8:19 PM

## 2012-11-26 NOTE — ED Notes (Signed)
Pt presents w/ general malaise, shaking tremors, chest pain and emesis "more times then I can count". Pt w/ long hx of cirrohsis, sclera jaundiced as is skin. Mother at bedside. Pt states she just wants to sleep

## 2012-11-26 NOTE — ED Notes (Signed)
Vance Gather RN aware of positive hemoccult card.

## 2012-11-26 NOTE — ED Notes (Signed)
Pt cannot urinate at this time. Will notify staff when able. RN aware.

## 2012-11-26 NOTE — ED Provider Notes (Signed)
History     CSN: [id number]  Arrival date & time 11/26/12  7829   First MD Initiated Contact with Patient 11/26/12 [phone]      No chief complaint on file.   (Consider location/radiation/quality/duration/timing/severity/associated sxs/prior treatment) HPI Comments: 27 yo F with known autoimmune hepatitis and esophageal varices, PSC, seizures - comes in with cc of nausea, emesis and weakness. Pt's sx started last night. She has had 10+ episodes of emesis, dark, coffee ground emesis, no bloody stools. Pt has some vague epigastric abd pain. Pain is non radiating. Pt has some weakness, generalized, lightheadedness. Pt on evaluation also mentions chest discomfort, diffuse, constant. No associated sx. She had a run of what appears to be wide complex tachycardia prior to my arrival.    The history is provided by the patient and medical records.    Past Medical History  Diagnosis Date  . Substance abuse April 2013    Per [facility] notes - History of cocaine plus THC usage - limits liver transplant options  . Esophageal varices   . Cirrhosis of liver   . Autoimmune hepatitis     Confirmed via liver biopsy 2002  . PSC (primary sclerosing cholangitis)   . Seizures     Past Surgical History  Procedure Laterality Date  . Esophagogastroduodenoscopy  07/18/2012    Procedure: ESOPHAGOGASTRODUODENOSCOPY (EGD);  Surgeon: Louis Meckel, MD;  Location: [facility];  Service: Endoscopy;  Laterality: N/A;  . Gastric varices banding  07/18/2012    Procedure: GASTRIC VARICES BANDING;  Surgeon: Louis Meckel, MD;  Location: [facility];  Service: Endoscopy;  Laterality: N/A;    Family History  Problem Relation Age of Onset  . Cancer Neg Hx     History  Substance Use Topics  . Smoking status: Former Smoker    Types: Cigarettes    Quit date: 08/09/2005  . Smokeless tobacco: Never Used  . Alcohol Use: No    OB History   Grav Para Term Preterm Abortions TAB SAB Ect Mult Living                   Review of Systems  Constitutional: Positive for activity change and fatigue. Negative for fever.  HENT: Negative for facial swelling and neck pain.   Respiratory: Negative for cough, chest tightness, shortness of breath and wheezing.   Cardiovascular: Positive for chest pain. Negative for palpitations and leg swelling.  Gastrointestinal: Positive for nausea, vomiting and abdominal pain. Negative for diarrhea, constipation, blood in stool, abdominal distention and rectal pain.  Genitourinary: Negative for hematuria and difficulty urinating.  Skin: Positive for color change.  Neurological: Negative for speech difficulty.  Hematological: Does not bruise/bleed easily.  Psychiatric/Behavioral: Negative for confusion.  All other systems reviewed and are negative.    Allergies  Ibuprofen  Home Medications   Current Outpatient Rx  Name  Route  Sig  Dispense  Refill  . azaTHIOprine (IMURAN) 50 MG tablet   Oral   Take 50 mg by mouth daily.          [name] CARAFATE 1 GM/10ML suspension      take 10 milliliters by mouth twice a day if needed for stomach pain   420 mL   0   . ciprofloxacin (CIPRO) 500 MG tablet   Oral   Take 1 tablet (500 mg total) by mouth 2 (two) times daily.   10 tablet   0   . EPINEPHrine (EPI-PEN) 0.3 mg/0.3 mL DEVI   Intramuscular  Inject 0.3 mg into the muscle once.         . furosemide (LASIX) 40 MG tablet   Oral   Take 120 mg by mouth 2 (two) times daily.         [name] HYDROcodone-acetaminophen (NORCO) 5-325 MG per tablet   Oral   Take 1-2 tablets by mouth every 6 (six) hours as needed for pain.   30 tablet   0   . KLOR-CON M20 20 MEQ tablet      take 1 tablet by mouth twice a day   60 tablet   0   . Lactulose 20 GM/30ML SOLN   Oral   Take 30 mLs (20 g total) by mouth daily as needed. For constipation   500 mL   0   . levETIRAcetam (KEPPRA) 750 MG tablet   Oral   Take 750 mg by mouth daily. Patient is supposed to take twice daily but  only takes once         . metoCLOPramide (REGLAN) 10 MG tablet      take 1 tablet by mouth four times a day   90 tablet   0   . nadolol (CORGARD) 20 MG tablet   Oral   Take 20 mg by mouth at bedtime.          . ondansetron (ZOFRAN-ODT) 4 MG disintegrating tablet   Oral   Take 4 mg by mouth every 6 (six) hours as needed. For nausea         . pantoprazole (PROTONIX) 40 MG tablet   Oral   Take 40 mg by mouth 2 (two) times daily.         . promethazine (PHENERGAN) 25 MG suppository   Rectal   Place 25 mg rectally every 6 (six) hours as needed. For nausea         . spironolactone (ALDACTONE) 100 MG tablet   Oral   Take 1 tablet (100 mg total) by mouth daily.   60 tablet   0     BP 133/63  Pulse 112  Temp(Src) 98.8 F (37.1 C) (Oral)  Resp 20  SpO2 100%  Physical Exam  Nursing note and vitals reviewed. Constitutional: She is oriented to person, place, and time. She appears well-developed and well-nourished.  HENT:  Head: Normocephalic and atraumatic.  Eyes: EOM are normal. Pupils are equal, round, and reactive to light. Scleral icterus is present.  Neck: Neck supple. No JVD present.  Cardiovascular: Regular rhythm.   Murmur heard. Pulmonary/Chest: Effort normal. No respiratory distress.  Abdominal: Soft. She exhibits no distension. There is tenderness. There is no rebound and no guarding.  guaiac positive stools, no melena, Epigastric tenderness  Musculoskeletal: She exhibits no edema and no tenderness.  Neurological: She is alert and oriented to person, place, and time.  Skin: Skin is warm and dry.    ED Course  Procedures (including critical care time)  Labs Reviewed  CBC WITH DIFFERENTIAL  BASIC METABOLIC PANEL  HEPATIC FUNCTION PANEL  LIPASE, BLOOD  PROTIME-INR  APTT  MAGNESIUM  AMMONIA  TYPE AND SCREEN   No results found.   No diagnosis found.    MDM  DDx includes: Esophagitis Mallory Weiss tear [place]  Variceal  bleeding PUD/Gastritis/ulcers Liver cirrhosis Infectious Cholangitis ACS syndrome Dissection Elyte abn Vtach tox syndrome/drug use   Pt comes in with cc of weakness, nausea, emesis. Pt has hx of PSC, liver cirrhosis, variceal bleed. Emesis -coffee ground. Pt is slightly tachycardic.  Initial concerns would be that patient has UGIB. We have gotten 2 large bore iv access, and started resuscitation as she is tachycardic and sent out basic labs.  Pt also had a run of vtach and some chest tightness. With the vtach she was hrmodynamically stable and her chest pain at least wasn't worse than when i saw her. Will check lytes, continue tele. No ASA provided due to possible bleed. Want to make sure there is no free air or esophageal rupture. Possible elyte abn. ACS syndrome possible - as she has hx of cocaine use and admits to last use being last week. No hx of chest pains.   Date: 11/26/2012  Rate: 120  Rhythm: sinus tachycardia rhythm  QRS Axis: normal  Intervals: normal  ST/T Wave abnormalities: normal  Conduction Disutrbances: none  Narrative Interpretation: unremarkable  11:46 AM Labs look stable, and even the abnormal labs are stable for patient. No vtach since that event. It is possible that vtach was captured due to the motion....and may not be real? I will call Cards to see if they want [place] to initiate any meds.   12:37 PM Dr. Donnie Aho, Cardiology to see patient at [facility]. Family practice paged again and notified to call Dr. Donnie Aho when patient arrived. No anti-dysrhythmics per Dr. Donnie Aho at this time.  Derwood Kaplan, MD 11/26/12 1239

## 2012-11-27 ENCOUNTER — Encounter: Payer: Self-pay | Admitting: Physician Assistant

## 2012-11-27 ENCOUNTER — Encounter: Payer: Self-pay | Admitting: Family Medicine

## 2012-11-27 ENCOUNTER — Encounter (HOSPITAL_COMMUNITY): Payer: Self-pay | Admitting: Family Medicine

## 2012-11-27 ENCOUNTER — Inpatient Hospital Stay: Payer: Medicare Other | Admitting: Family Medicine

## 2012-11-27 DIAGNOSIS — R1011 Right upper quadrant pain: Principal | ICD-10-CM

## 2012-11-27 DIAGNOSIS — R112 Nausea with vomiting, unspecified: Secondary | ICD-10-CM

## 2012-11-27 DIAGNOSIS — D696 Thrombocytopenia, unspecified: Secondary | ICD-10-CM

## 2012-11-27 DIAGNOSIS — K7460 Unspecified cirrhosis of liver: Secondary | ICD-10-CM

## 2012-11-27 DIAGNOSIS — G8929 Other chronic pain: Secondary | ICD-10-CM | POA: Diagnosis present

## 2012-11-27 DIAGNOSIS — K766 Portal hypertension: Secondary | ICD-10-CM

## 2012-11-27 LAB — COMPREHENSIVE METABOLIC PANEL
CO2: 29 mEq/L (ref 19–32)
Calcium: 8.4 mg/dL (ref 8.4–10.5)
Creatinine, Ser: 0.78 mg/dL (ref 0.50–1.10)
GFR calc Af Amer: >90 mL/min (ref >90)
GFR calc non Af Amer: >90 mL/min (ref >90)
Glucose, Bld: 91 mg/dL (ref 70–99)
Total Protein: 5.8 g/dL — ABNORMAL LOW (ref 6.0–8.3)

## 2012-11-27 LAB — CBC
Platelets: 38 10*3/uL — ABNORMAL LOW (ref 150–400)
RBC: 2.51 MIL/uL — ABNORMAL LOW (ref 3.87–5.11)
RDW: 14.5 % (ref 11.5–15.5)
WBC: 3.4 10*3/uL — ABNORMAL LOW (ref 4.0–10.5)

## 2012-11-27 LAB — PROTIME-INR: INR: 1.63 — ABNORMAL HIGH (ref 0.00–1.49)

## 2012-11-27 LAB — TROPONIN I: Troponin I: <0.3 ng/mL (ref <0.30)

## 2012-11-27 MED ORDER — MORPHINE SULFATE 4 MG/ML IJ SOLN
4.0000 mg | Freq: Once | INTRAMUSCULAR | Status: AC
  Administered 2012-11-27: 4 mg via INTRAVENOUS

## 2012-11-27 MED ORDER — LEVETIRACETAM 750 MG PO TABS: 750.0000 mg | ORAL_TABLET | Freq: Two times a day (BID) | ORAL | Status: DC

## 2012-11-27 MED ORDER — OXYCODONE HCL 5 MG PO TABS
10.0000 mg | ORAL_TABLET | Freq: Four times a day (QID) | ORAL | Status: DC | PRN
  Administered 2012-11-27 – 2012-11-28 (×3): 10 mg via ORAL
  Filled 2012-11-27 (×3): qty 2

## 2012-11-27 MED ORDER — HYDROXYZINE HCL 25 MG PO TABS
25.0000 mg | ORAL_TABLET | Freq: Four times a day (QID) | ORAL | Status: DC | PRN
  Administered 2012-11-27 – 2012-11-28 (×4): 25 mg via ORAL
  Filled 2012-11-27 (×4): qty 1

## 2012-11-27 MED ORDER — HYDROXYZINE HCL 25 MG PO TABS
25.0000 mg | ORAL_TABLET | Freq: Every day | ORAL | Status: DC | PRN
  Administered 2012-11-27: 25 mg via ORAL
  Filled 2012-11-27: qty 1

## 2012-11-27 MED ORDER — LEVETIRACETAM 750 MG PO TABS
750.0000 mg | ORAL_TABLET | Freq: Two times a day (BID) | ORAL | Status: DC
  Administered 2012-11-27 – 2012-11-28 (×2): 750 mg via ORAL
  Filled 2012-11-27 (×3): qty 1

## 2012-11-27 NOTE — Progress Notes (Signed)
Patient ID: Daliana Leverett, female   DOB: 06-21-1986, 27 y.o.   MRN: [id number] Family Medicine Teaching Service Daily Progress Note Service Page: [phone]   Subjective:  Patient reports she did not sleep well last night and only took "small cat naps" because she was in pain. She reports the "other" hospital gives her medication that takes away the pain and makes her sleep. She has tolerated a liquid diet without complication. She has asked all evening for juice, broth and icy without experiencing nausea or vomit. She is asking for food today.  Patient denies abdominal pain, chest pain or shortness of breath. Nursing reports she asked for food through the entire night and although patient stated she needed more medications, she looked comfortable. Objective: Temp:  [98.1 F (36.7 C)-98.9 F (37.2 C)] 98.6 F (37 C) (04/21 0746) Pulse Rate:  [84-123] 84 (04/21 0746) Cardiac Rhythm:  [-] Sinus tachycardia (04/21 0717) Resp:  [11-23] 18 (04/21 0746) BP: (105-130)/(46-68) 105/64 mmHg (04/21 0746) SpO2:  [98 %-100 %] 100 % (04/21 0746) Weight:  [149 lb 9.6 oz (67.858 kg)] 149 lb 9.6 oz (67.858 kg) (04/20 1454)  Intake/Output Summary (Last 24 hours) at 11/27/12 1147 Last data filed at 11/27/12 0818  Gross per 24 hour  Intake    450 ml  Output    350 ml  Net    100 ml    Exam: Gen: NAD, alert, cooperative with exam. Scratching constantly.  HEENT: NCAT, EOMI; bilateral icterus present CV: RRR. 1/6 SM  Resp: CTAB, non labored, good air movement.  Abd: Soft. ND TTP mid epigastric and RUQ. BS present Ext: No erythema or edema. Right shin is tender. +2/4 pulses bilaterally; SCD's in place, but not functioning.  Skin: No rashes noted. Excoriations from scratching (right foot). Well perfused. Neuro: Alert and oriented, No gross deficits   I have reviewed the patient's medications, labs, imaging, and diagnostic testing.  Notable results are summarized below. CBC    Component Value Date/Time    WBC 3.4* 11/27/2012 0500   WBC 4.7 08/14/2012 1555   RBC 2.51* 11/27/2012 0500   RBC 2.81* 08/14/2012 1555   HGB 9.4* 11/27/2012 0500   HGB 11.2* 08/14/2012 1555   HCT 26.3* 11/27/2012 0500   HCT 32.1* 08/14/2012 1555   PLT 38* 11/27/2012 0500   PLT 63* 08/14/2012 1555   MCV 104.8* 11/27/2012 0500   MCV 114.2* 08/14/2012 1555   MCH 37.5* 11/27/2012 0500   MCH 39.8* 08/14/2012 1555   MCHC 35.7 11/27/2012 0500   MCHC 34.8 08/14/2012 1555   RDW 14.5 11/27/2012 0500   RDW 14.5 08/14/2012 1555   LYMPHSABS 0.4* 11/26/2012 1019   LYMPHSABS 0.6* 08/14/2012 1555   MONOABS 0.2 11/26/2012 1019   MONOABS 0.4 08/14/2012 1555   EOSABS 0.0 11/26/2012 1019   EOSABS 0.1 08/14/2012 1555   BASOSABS 0.0 11/26/2012 1019   BASOSABS 0.0 08/14/2012 1555     CMP     Component Value Date/Time   NA 133* 11/27/2012 0500   NA 133* 08/07/2012 1422   K 3.7 11/27/2012 0500   K 3.9 08/07/2012 1422   CL 99 11/27/2012 0500   CL 100 08/07/2012 1422   CO2 29 11/27/2012 0500   CO2 24 08/07/2012 1422   GLUCOSE 91 11/27/2012 0500   GLUCOSE 224* 08/07/2012 1422   BUN 10 11/27/2012 0500   BUN 10.0 08/07/2012 1422   CREATININE 0.78 11/27/2012 0500   CREATININE 0.71 10/19/2012 1542   CREATININE 0.7 08/07/2012 1422  CALCIUM 8.4 11/27/2012 0500   CALCIUM 7.9* 08/07/2012 1422   PROT 5.8* 11/27/2012 0500   PROT 5.5* 06/20/2012 1212   ALBUMIN 1.7* 11/27/2012 0500   ALBUMIN 1.5* 06/20/2012 1212   AST 141* 11/27/2012 0500   AST 239* 06/20/2012 1212   ALT 66* 11/27/2012 0500   ALT 67* 06/20/2012 1212   ALKPHOS 576* 11/27/2012 0500   ALKPHOS 1,104* 06/20/2012 1212   BILITOT 12.4* 11/27/2012 0500   BILITOT 17.47* 06/20/2012 1212   GFRNONAA >90 11/27/2012 0500   GFRAA >90 11/27/2012 0500    Recent Labs Lab 11/21/12 1005 11/26/12 1019 11/27/12 0500  INR 1.50* 1.46 1.63*       Recent Labs Lab 11/26/12 1638 11/26/12 2311 11/27/12 0500  TROPONINI <0.30 <0.30 <0.30     Dg Chest 1 View  11/26/2012  *RADIOLOGY REPORT*  Clinical Data:  Coffee-ground emesis.  Esophageal varices. Cirrhosis.  CHEST - 1 VIEW  Comparison: 04/07/2011  Findings: Heart is upper limits normal in size.  Lungs are clear. No effusions or acute bony abnormality.  IMPRESSION: No acute cardiopulmonary disease.   Original Report Authenticated By: Charlett Nose, M.D.    Assessment/Plan: Marcena Dias is a 27 y.o. year old female presenting with nausea vomiting and recurrent abd pain acutely worsened for 1 day but that has persisted for several months. With her report of dark colored Nausea, vomiting, abd pain  - Controlled now with zofran currently, pain improved with morphine from [facility]  - Gastrits vs PUD, pancreatitis unlikely with lipase of 18  - Last EGD 12/13 with grade 1 esophageal varices;With Hx esophageal varices and reported dark emesis there is a concern for UGIB--> resolved   - Hgb stable from 5 days ago 9.7-->10.5-->9.4 (probable dilutional affect)  - Consider GI consult if hematemesis continues and patient is documented Gastroccult positive, however this has resolved.  - PRN zofran, clear liquids  - Continue nadolol   Primary sclerosis cholangitis, Non-alcoholic Cirrhosis hemodynamically stable, labs improved from 11/03/2012 labs. Elevated alk phos to 689, TBil 12.1, AT 145, ALT 75  On admission.  - Albumin 1.9, INR 1.4 --> 1.63 - Meld score of 20 indicating 80-100% 3 months survival  - continue Imuran  - Vistaril for itching 25mg  Q6PRN - Daily INR - Consulted GI, spoke with Dr. Marzetta Board PA [facility]. At this time patient would not be a candidate for EGD d/t her plt count and cocaine use. [facility] Dec will schedule an appointment for ~4weeks in the office. Recommended patient refrain from drug use and take her GI medications as scheduled. We thank them for their recommendations.   Chest pain - Typical  - VTach in [facility] ED examined by Cards here and feel it is artifact superimposed on sinus tach- they have signed off   -  troponins negative x3 - monitor on tele and  for resolution/recurrence --> may discontinue this afternoon  Thrombocytopenia  - likely cirrhoisis related  - Stable around 50 K --> today's 38 - avoid heparin   Seizure d/o  - continue home dose Keppra BID  Peripheral edema --> improved today - trace on exam today  - likely due to low osmotic pressure from decreased hepatic synthetic function.  - continue lasix- 120 BID   FEN/GI: bland diet today,  KVO fluids  Prophylaxis: SCDs, avoiding heparin considering thrombocytopenia Disposition: Tele, home when clinically improved  Code Status: Full

## 2012-11-27 NOTE — H&P (Signed)
[facility] Attending Admission Note: Jery Hollern MD [phone] pager office [phone] I  have seen and examined this patient, reviewed their chart. I have discussed this patient with the resident. I agree with the resident's findings, assessment and care plan. 

## 2012-11-27 NOTE — Progress Notes (Signed)
I have seen and examined this patient. I have discussed with Dr Claiborne Billings.  I agree with their findings and plans as documented in their progress note.  1. Nausea and vomiting  -  Resolved 2. Epigastric pain, chronic with acute exacerbation.  - Possible gastroparesis. 3. Thrombocytopenia likely secondary to cirrhosis with portal hypertension and from Imuran.  - Pt has scheduled herself an appointment with Dr Alcide Evener (hematology) to discuss low platelets.  Plan: Single dose Morphine sulfate then switch to per oral oxycodone Follow up with Dr Arlyce Dice as outpatient for planned Gastric emptying study See if we can set up a follow up consultation for patient with her hepatologist at [facility].

## 2012-11-27 NOTE — Discharge Summary (Signed)
Physician Discharge Summary  Patient ID: [name] MRN: [id number] DOB/AGE: 1985-09-23 27 y.o.  Admit date: 11/26/2012 Discharge date: 11/28/2012 Admission Diagnoses: Abdominal Pain   Discharge Diagnoses:  Principal Problem:   Abdominal pain, chronic, right upper quadrant Active Problems:   THROMBOCYTOPENIA   TOBACCO DEPENDENCE   ESOPHAGEAL VARICES   LIVER FAILURE   Seizure disorder   Sclerosing cholangitis   Discharged Condition: good  Hospital Course:  Assessment and Plan:  [name] is a 27 y.o. year old female with a history of primary sclerosis cholangitis and non-alcoholic cirrhosis that presented with nausea, vomiting and recurrent abd pain that acutely worsened for 1 day, but that has persisted for several months. With her report of dark colored nausea, vomiting, abd pain and history of esophageal varices there was a concern for upper GI bleed. Last EGD was 12/13 with grade 1 esophageal varices.  Patient tested positive for cocaine this admission. In addition her lipase was 18, Hgb stable from 1 week prior 9.7-->10.5; this remained stable throughout her admisison. GI was notified and declined to see patient in house, due to her low platelet level and Cocaine use, but they set up outpatient follow up. Patients pain was controlled with zofran, morphine and oxycodone 10mg .   Primary sclerosis cholangitis, Non-alcoholic Cirrhosis: Hemodynamically stable, labs improved from 11/03/2012 labs. Elevated alk phos to 689, TBil 12.1, AT 145, ALT 75 Albumin 1.9, INR 1.4. Meld score of 20 indicating 80-100% 3 months survival. On admission she was continued on  Imuran. Daily INR remained stable at ~ 1.5. Patient was continued on her nadolol during her admission.   Chest pain: VTach in [facility] ED examined by Cards here and they felt it is artifact superimposed on sinus tach- they have signed off.  Troponin negative x3. Patient was monitored on telemetry with no acute events.  Thrombocytopenia:  On admission her platelets were 50k, which is her baseline. It declined to 38, then elevated to 44 on day of discharge likely cirrhoisis related. We avoided heparin and used SCD's for DVT prophylaxis.    Seizure d/o: At home she reports she only takes her Keppra once a day. This was continued BID in hospital.  Peripheral edema: On admission patient had 1-2+ edema bilaterally. She was encouraged to keep her feet elevated, applied SCD's and continued on lasix 120 BID. On day of discharge patient had no edema or erythema.   Consults: None  Significant Diagnostic Studies:  11/26/2012 CXR No acute cardiopulmonary disease.   Original Report Authenticated By: Charlett Nose, M.D.     Treatments: IV hydration and analgesia: Morphine, vistaril  Discharge Exam: Blood pressure 103/65, pulse 67, temperature 97.8 F (36.6 C), temperature source Oral, resp. rate 18, height 5\' 1"  (1.549 m), weight 149 lb 9.6 oz (67.858 kg), SpO2 99.00%. Gen: NAD, alert, cooperative with exam. Scratching frequently.  HEENT: NCAT, EOMI; bilateral icterus present  CV: RRR. 1/6 SM  Resp: CTAB, non labored, good air movement.  Abd: Soft. ND TTP mid epigastric and RUQ. BS present  Ext: No erythema or edema. +2/4 pulses bilaterally; SCD's in place, but not functioning.  Skin: No rashes noted. Excoriations from scratching (right foot). Well perfused.  Neuro: Alert and oriented, No gross deficits   Disposition: 01-Home or Self Care      Discharge Orders   Future Appointments Provider Department Dept Phone   12/08/2012 10:30 AM Ladene Artist, MD [facility] MEDICAL ONCOLOGY [phone]   12/15/2012 9:00 AM [facility] [phone]   NPO after midnight, and off all stomach meds 24 hrs prior to exam   12/26/2012 11:00 AM Louis Meckel, MD [facility] [phone]   Future Orders Complete By Expires     Diet - low sodium heart healthy  As directed      Discharge instructions  As directed     Comments:      Added vistaril to her medication list to be taken every 6h PRN Also gave prescription for DME compression stockings ( no name brand needed)    For home use only DME Other see comment  As directed     Comments:      20-30 mm graduated compression stockings  Wear daily for edema of bilateral legs DX: Primary Biliary Sclerosis; low osmotic pressure from decreased liver function    Increase activity slowly  As directed         Medication List    TAKE these medications       azaTHIOprine 50 MG tablet  Commonly known as:  IMURAN  Take 50 mg by mouth daily.     CARAFATE 1 GM/10ML suspension  Generic drug:  sucralfate  take 10 milliliters by mouth twice a day if needed for stomach pain     ciprofloxacin 500 MG tablet  Commonly known as:  CIPRO  Take 1 tablet (500 mg total) by mouth 2 (two) times daily.     EPINEPHrine 0.3 mg/0.3 mL Devi  Commonly known as:  EPI-PEN  Inject 0.3 mg into the muscle once.     furosemide 40 MG tablet  Commonly known as:  LASIX  Take 120 mg by mouth 2 (two) times daily.     HYDROcodone-acetaminophen 5-325 MG per tablet  Commonly known as:  NORCO  Take 1-2 tablets by mouth every 6 (six) hours as needed for pain.     hydrOXYzine 25 MG tablet  Commonly known as:  ATARAX/VISTARIL  Take 1 tablet (25 mg total) by mouth every 6 (six) hours as needed for itching.     KLOR-CON M20 20 MEQ tablet  Generic drug:  potassium chloride SA  take 1 tablet by mouth twice a day     Lactulose 20 GM/30ML Soln  Take 30 mLs (20 g total) by mouth daily as needed. For constipation     levETIRAcetam 750 MG tablet  Commonly known as:  KEPPRA  Take 1 tablet (750 mg total) by mouth every 12 (twelve) hours. Patient is supposed to take twice daily but only takes once     metoCLOPramide 10 MG tablet  Commonly known as:  REGLAN  take 1 tablet by mouth four times a day     nadolol 20 MG tablet  Commonly known  as:  CORGARD  Take 20 mg by mouth at bedtime.     ondansetron 4 MG disintegrating tablet  Commonly known as:  ZOFRAN-ODT  Take 4 mg by mouth every 6 (six) hours as needed. For nausea     pantoprazole 40 MG tablet  Commonly known as:  PROTONIX  Take 40 mg by mouth daily.     promethazine 25 MG suppository  Commonly known as:  PHENERGAN  Place 25 mg rectally every 6 (six) hours as needed. For nausea     spironolactone 100 MG tablet  Commonly known as:  ALDACTONE  Take 1 tablet (100 mg total) by mouth daily.       Follow-up Information   Follow up with Molly Maduro  Arlyce Dice, MD On 12/26/2012. (11 AM.  follow up appt with Dr Arlyce Dice. )    Contact information:   [address] [place] [address] [phone]       Follow up with Lloyd Huger, MD. Schedule an appointment as soon as possible for a visit in 1 week.   Contact information:   [address] [place] [address] [phone]     Outpatient recommendations: - Consider changing out nadolol for another medication, d/t patient's cocaine use. A discussion with the patient about her cocaine use preventing her from being placed on liver transplant list and future EGD procedures was accomplished inpatient. This may need re-addressed in outpatient setting. - Pt has outpatient appointments for hem-onc, gastric empty study, and GI for May. She also has a EGD scheduled for Dec.   - Re-affirm she is taking her Keppra BID, on admission she admitted to taking QD. We encouraged her to take as directed, BID.  - After I spoke with pharmacy, I gave her a prescription for vistaril for her itching.  - I gave her a DME prescription for 20-30 mm compression stockings.   Signed: Flo Berroa 11/29/2012, 8:46 PM

## 2012-11-28 LAB — CBC
Hemoglobin: 9.6 g/dL — ABNORMAL LOW (ref 12.0–15.0)
RBC: 2.55 MIL/uL — ABNORMAL LOW (ref 3.87–5.11)
WBC: 5 10*3/uL (ref 4.0–10.5)

## 2012-11-28 LAB — PROTIME-INR
INR: 1.58 — ABNORMAL HIGH (ref 0.00–1.49)
Prothrombin Time: 18.4 seconds — ABNORMAL HIGH (ref 11.6–15.2)

## 2012-11-28 MED ORDER — HYDROXYZINE HCL 25 MG PO TABS: 25.0000 mg | ORAL_TABLET | Freq: Four times a day (QID) | ORAL | Status: DC | PRN

## 2012-11-28 MED ORDER — HYDROCODONE-ACETAMINOPHEN 5-325 MG PO TABS: 1.0000 | ORAL_TABLET | Freq: Four times a day (QID) | ORAL | Status: DC | PRN

## 2012-11-28 NOTE — Progress Notes (Signed)
I discussed with Dr Kuneff.  I agree with their plans documented in their progress note for today.  

## 2012-11-28 NOTE — Progress Notes (Signed)
Patient ID: [name], female   DOB: 1985-09-23, 27 y.o.   MRN: [id number] Family Medicine Teaching Service Daily Progress Note Service Page: [phone]   Subjective:  Staley is feeling better this morning, her pain has decreased. She is still itching quite a bit. Her father is at her bedside. She has eaten two breakfasts and tolerating well. She feels ready to go home.  Objective: Temp:  [98.1 F (36.7 C)-98.5 F (36.9 C)] 98.5 F (36.9 C) (04/22 5784) Pulse Rate:  [71-75] 71 (04/22 6962) Cardiac Rhythm:  [-] Sinus tachycardia (04/22 0710) Resp:  [14-16] 16 (04/22 0632) BP: (93-103)/(57-60) 93/57 mmHg (04/22 0632) SpO2:  [97 %-100 %] 97 % (04/22 9528)  Intake/Output Summary (Last 24 hours) at 11/28/12 0947 Last data filed at 11/28/12 0813  Gross per 24 hour  Intake   1165 ml  Output      0 ml  Net   1165 ml    Exam: Gen: NAD, alert, cooperative with exam. Scratching frequently.  HEENT: NCAT, EOMI; bilateral icterus present CV: RRR. 1/6 SM  Resp: CTAB, non labored, good air movement.  Abd: Soft. ND TTP mid epigastric and RUQ. BS present Ext: No erythema or edema.  +2/4 pulses bilaterally; SCD's in place, but not functioning.  Skin: No rashes noted. Excoriations from scratching (right foot). Well perfused. Neuro: Alert and oriented, No gross deficits   I have reviewed the patient's medications, labs, imaging, and diagnostic testing.  Notable results are summarized below. CBC    Component Value Date/Time   WBC 3.4* 11/27/2012 0500   WBC 4.7 08/14/2012 1555   RBC 2.51* 11/27/2012 0500   RBC 2.81* 08/14/2012 1555   HGB 9.4* 11/27/2012 0500   HGB 11.2* 08/14/2012 1555   HCT 26.3* 11/27/2012 0500   HCT 32.1* 08/14/2012 1555   PLT 38* 11/27/2012 0500   PLT 63* 08/14/2012 1555   MCV 104.8* 11/27/2012 0500   MCV 114.2* 08/14/2012 1555   MCH 37.5* 11/27/2012 0500   MCH 39.8* 08/14/2012 1555   MCHC 35.7 11/27/2012 0500   MCHC 34.8 08/14/2012 1555   RDW 14.5 11/27/2012 0500   RDW 14.5 08/14/2012  1555   LYMPHSABS 0.4* 11/26/2012 1019   LYMPHSABS 0.6* 08/14/2012 1555   MONOABS 0.2 11/26/2012 1019   MONOABS 0.4 08/14/2012 1555   EOSABS 0.0 11/26/2012 1019   EOSABS 0.1 08/14/2012 1555   BASOSABS 0.0 11/26/2012 1019   BASOSABS 0.0 08/14/2012 1555     CMP     Component Value Date/Time   NA 133* 11/27/2012 0500   NA 133* 08/07/2012 1422   K 3.7 11/27/2012 0500   K 3.9 08/07/2012 1422   CL 99 11/27/2012 0500   CL 100 08/07/2012 1422   CO2 29 11/27/2012 0500   CO2 24 08/07/2012 1422   GLUCOSE 91 11/27/2012 0500   GLUCOSE 224* 08/07/2012 1422   BUN 10 11/27/2012 0500   BUN 10.0 08/07/2012 1422   CREATININE 0.78 11/27/2012 0500   CREATININE 0.71 10/19/2012 1542   CREATININE 0.7 08/07/2012 1422   CALCIUM 8.4 11/27/2012 0500   CALCIUM 7.9* 08/07/2012 1422   PROT 5.8* 11/27/2012 0500   PROT 5.5* 06/20/2012 1212   ALBUMIN 1.7* 11/27/2012 0500   ALBUMIN 1.5* 06/20/2012 1212   AST 141* 11/27/2012 0500   AST 239* 06/20/2012 1212   ALT 66* 11/27/2012 0500   ALT 67* 06/20/2012 1212   ALKPHOS 576* 11/27/2012 0500   ALKPHOS 1,104* 06/20/2012 1212   BILITOT 12.4* 11/27/2012 0500  BILITOT 17.47* 06/20/2012 1212   GFRNONAA >90 11/27/2012 0500   GFRAA >90 11/27/2012 0500    Recent Labs Lab 11/21/12 1005 11/26/12 1019 11/27/12 0500  INR 1.50* 1.46 1.63*       Recent Labs Lab 11/26/12 1638 11/26/12 2311 11/27/12 0500  TROPONINI <0.30 <0.30 <0.30     Dg Chest 1 View  11/26/2012  *RADIOLOGY REPORT*  Clinical Data: Coffee-ground emesis.  Esophageal varices. Cirrhosis.  CHEST - 1 VIEW  Comparison: 04/07/2011  Findings: Heart is upper limits normal in size.  Lungs are clear. No effusions or acute bony abnormality.  IMPRESSION: No acute cardiopulmonary disease.   Original Report Authenticated By: Charlett Nose, M.D.    Assessment/Plan: [name] is a 27 y.o. year old female presenting with nausea vomiting and recurrent abd pain acutely worsened for 1 day but that has persisted for several months.  With her report of dark colored Nausea, vomiting, abd pain --> resolved  - Controlled now with zofran currently, pain improved with morphine from [facility]  - Gastrits vs PUD, pancreatitis unlikely with lipase of 18  - Last EGD 12/13 with grade 1 esophageal varices;With Hx esophageal varices and reported dark emesis there is a concern for UGIB--> resolved   - Hgb stable from 5 days ago 9.7-->10.5-->9.4 (probable dilutional affect)  - Consider GI consult if hematemesis continues and patient is documented Gastroccult positive, however this has resolved.  - PRN zofran, clear liquids  - Continue nadolol   Primary sclerosis cholangitis, Non-alcoholic Cirrhosis hemodynamically stable, labs improved from 11/03/2012 labs. Elevated alk phos to 689, TBil 12.1, AT 145, ALT 75  On admission.  - Albumin 1.9, INR 1.4 --> 1.63 - Meld score of 20 indicating 80-100% 3 months survival  - continue Imuran  - Vistaril for itching 25mg  Q6PRN - Daily INR - Consulted GI, spoke with Dr. Marzetta Board PA [facility]. At this time patient would not be a candidate for EGD d/t her plt count and cocaine use. [facility] will schedule an appointment for ~4weeks in the office. Recommended patient refrain from drug use and take her GI medications as scheduled. We thank them for their recommendations.  - EGD is scheduled for December 2014, gastric empty scheduled for May 2014.   Chest pain - Typical  - VTach in [facility] ED examined by Cards here and feel it is artifact superimposed on sinus tach- they have signed off   -  troponins negative x3 - monitor on tele and for resolution/recurrence --> may discontinue this afternoon  Thrombocytopenia: Patient has outpatient follow up with hem-onc - likely cirrhoisis related  - Stable around 50 K --> today's 38 - avoid heparin   Seizure d/o  - continue home dose Keppra BID  Peripheral edema --> improved today - trace on exam today  - likely due to low osmotic pressure from decreased hepatic synthetic  function.  - continue lasix- 120 BID   FEN/GI: bland diet today,  KVO fluids  Prophylaxis: SCDs, avoiding heparin considering thrombocytopenia Disposition:Probable discharge this afternoon.  Code Status: Full

## 2012-11-28 NOTE — Progress Notes (Signed)
Pt provided with dc instructions and education. Pt verbalized understanding. Pt provided with prescription and edcuatino on new medications. Pt able to [employer]. IV removed with tip intact. Heart monitor cleaned and returned to front. Levonne Spiller, RN

## 2012-11-30 NOTE — Discharge Summary (Signed)
I discussed with Dr Kuneff.  I agree with their plans documented in their progress note for today.  

## 2012-12-08 ENCOUNTER — Ambulatory Visit (HOSPITAL_BASED_OUTPATIENT_CLINIC_OR_DEPARTMENT_OTHER): Payer: Medicare Other | Admitting: Oncology

## 2012-12-08 ENCOUNTER — Telehealth: Payer: Self-pay | Admitting: Oncology

## 2012-12-08 VITALS — BP 142/73 | HR 87 | Temp 97.4°F | Resp 18 | Ht 61.0 in | Wt 151.6 lb

## 2012-12-08 DIAGNOSIS — Z9119 Patient's noncompliance with other medical treatment and regimen: Secondary | ICD-10-CM

## 2012-12-08 DIAGNOSIS — K754 Autoimmune hepatitis: Secondary | ICD-10-CM

## 2012-12-08 DIAGNOSIS — D696 Thrombocytopenia, unspecified: Secondary | ICD-10-CM

## 2012-12-08 DIAGNOSIS — R609 Edema, unspecified: Secondary | ICD-10-CM

## 2012-12-08 DIAGNOSIS — K7460 Unspecified cirrhosis of liver: Secondary | ICD-10-CM

## 2012-12-08 NOTE — Progress Notes (Signed)
   [facility]    OFFICE PROGRESS NOTE   INTERVAL HISTORY:   She returns after missing several scheduled visit. She remains off of promacta. She was admitted on 11/26/2012 with nausea/vomiting and abdominal pain. She reports having a partial seizure and chest pain at the same time. She was discharged on 11/28/2012. She reports improvement in the lower extremity edema with the current diuretic regimen though she continues to have edema when she ambulates. No bleeding aside from mild rectal bleeding. She had Hemoccult positive stool in the emergency room 11/26/2012. Objective:  Vital signs in last 24 hours:  Blood pressure 142/73, pulse 87, temperature 97.4 F (36.3 C), temperature source Oral, resp. rate 18, height 5\' 1"  (1.549 m), weight 151 lb 9.6 oz (68.765 kg).    HEENT: Scleral icterus, mouth without bleeding Resp: Lungs clear bilaterally Cardio: Regular rate and rhythm GI: The spleen is palpable in the left mid abdomen, no apparent ascites Vascular: The left lower leg is slightly larger than the right side. No erythema or edema.   Lab Results:  Lab Results  Component Value Date   WBC 5.0 11/28/2012   HGB 9.6* 11/28/2012   HCT 26.5* 11/28/2012   MCV 103.9* 11/28/2012   PLT 44* 11/28/2012   Medications: I have reviewed the patient's current medications.  Assessment/Plan: 1. 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 She is followed by Dr. Piedad Climes at [facility] and by Dr. Arlyce Dice locally. 2. Thrombocytopenia secondary to cirrhosis, hypersplenism and potentially ITP. She has been off of Promacta since September of 2013. The platelet count is stable.  3. History of a seizure disorder. She is maintained on Keppra. 4. History of intracerebral hemorrhage in the setting of severe thrombocytopenia. 5. History of an "aneurysm" followed by neurosurgery at [facility] 6. 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 7. History of intermittent headaches. 8. History of a "bruit" in the left  and right low neck/supraclavicular fossa. 9. History of amenorrhea status post GYN evaluation. 10. Admission 03/10/2009 with acute gastritis 11. Admission 01/09/2010 for post ERCP pain with pancreatitis suspected. 12. Status post ERCP 01/09/2010 with findings consistent with sclerosing cholangitis. 13. History of Hypokalemia.. 14. History of Hand and foot cramps. May be related to hypokalemia.  15. History of an Irregular heart rate. EKG showed PACs and PVCs. 16. Medical noncompliance. 17. Leg edema/weight gain-secondary to decompensated cirrhosis with volume overload. Improved.  Disposition:  Her overall status appears unchanged. The platelet count is lower since discontinuing Promacta, but remains adequate. We will resume the Promacta for a platelet count of less than 30,000 or bleeding. She plans to followup with Dr. Arlyce Dice within the next few weeks for a repeat upper endoscopy. She is scheduling an appointment with Dr. Piedad Climes in order to get back on the liver transplant list.  [name] will return for an office visit and CBC in 2 months.  Thornton Papas, MD  12/08/2012  2:17 PM

## 2012-12-15 ENCOUNTER — Inpatient Hospital Stay (HOSPITAL_COMMUNITY)
Admission: EM | Admit: 2012-12-15 | Discharge: 2012-12-17 | DRG: 897 | Disposition: A | Discharge status: Home / Self Care | Payer: Medicare Other | Attending: Family Medicine | Admitting: Family Medicine

## 2012-12-15 ENCOUNTER — Emergency Department (HOSPITAL_COMMUNITY): Payer: Medicare Other

## 2012-12-15 ENCOUNTER — Telehealth: Payer: Self-pay | Admitting: *Deleted

## 2012-12-15 ENCOUNTER — Encounter (HOSPITAL_COMMUNITY): Payer: Self-pay | Admitting: Emergency Medicine

## 2012-12-15 ENCOUNTER — Other Ambulatory Visit: Payer: Self-pay

## 2012-12-15 ENCOUNTER — Encounter (HOSPITAL_COMMUNITY)
Admission: RE | Admit: 2012-12-15 | Discharge: 2012-12-15 | Disposition: A | Discharge status: Home / Self Care | Payer: Medicare Other | Source: Ambulatory Visit | Attending: Gastroenterology | Admitting: Gastroenterology

## 2012-12-15 DIAGNOSIS — Z87891 Personal history of nicotine dependence: Secondary | ICD-10-CM

## 2012-12-15 DIAGNOSIS — R600 Localized edema: Secondary | ICD-10-CM | POA: Diagnosis present

## 2012-12-15 DIAGNOSIS — Z91199 Patient's noncompliance with other medical treatment and regimen due to unspecified reason: Secondary | ICD-10-CM

## 2012-12-15 DIAGNOSIS — K7460 Unspecified cirrhosis of liver: Secondary | ICD-10-CM | POA: Diagnosis present

## 2012-12-15 DIAGNOSIS — K8309 Other cholangitis: Secondary | ICD-10-CM

## 2012-12-15 DIAGNOSIS — F10229 Alcohol dependence with intoxication, unspecified: Secondary | ICD-10-CM | POA: Diagnosis present

## 2012-12-15 DIAGNOSIS — R188 Other ascites: Secondary | ICD-10-CM | POA: Diagnosis present

## 2012-12-15 DIAGNOSIS — Z9119 Patient's noncompliance with other medical treatment and regimen: Secondary | ICD-10-CM

## 2012-12-15 DIAGNOSIS — F1910 Other psychoactive substance abuse, uncomplicated: Secondary | ICD-10-CM | POA: Diagnosis present

## 2012-12-15 DIAGNOSIS — D696 Thrombocytopenia, unspecified: Secondary | ICD-10-CM | POA: Diagnosis present

## 2012-12-15 DIAGNOSIS — F17200 Nicotine dependence, unspecified, uncomplicated: Secondary | ICD-10-CM

## 2012-12-15 DIAGNOSIS — R1011 Right upper quadrant pain: Secondary | ICD-10-CM | POA: Diagnosis present

## 2012-12-15 DIAGNOSIS — G8929 Other chronic pain: Secondary | ICD-10-CM

## 2012-12-15 DIAGNOSIS — F10939 Alcohol use, unspecified with withdrawal, unspecified: Principal | ICD-10-CM | POA: Diagnosis present

## 2012-12-15 DIAGNOSIS — R112 Nausea with vomiting, unspecified: Secondary | ICD-10-CM | POA: Diagnosis present

## 2012-12-15 DIAGNOSIS — K7290 Hepatic failure, unspecified without coma: Secondary | ICD-10-CM | POA: Diagnosis present

## 2012-12-15 DIAGNOSIS — K766 Portal hypertension: Secondary | ICD-10-CM | POA: Diagnosis present

## 2012-12-15 DIAGNOSIS — R109 Unspecified abdominal pain: Secondary | ICD-10-CM

## 2012-12-15 DIAGNOSIS — I8500 Esophageal varices without bleeding: Secondary | ICD-10-CM | POA: Diagnosis present

## 2012-12-15 DIAGNOSIS — E7220 Disorder of urea cycle metabolism, unspecified: Secondary | ICD-10-CM | POA: Diagnosis present

## 2012-12-15 DIAGNOSIS — T510X1A Toxic effect of ethanol, accidental (unintentional), initial encounter: Secondary | ICD-10-CM

## 2012-12-15 DIAGNOSIS — E871 Hypo-osmolality and hyponatremia: Secondary | ICD-10-CM | POA: Diagnosis present

## 2012-12-15 DIAGNOSIS — F10239 Alcohol dependence with withdrawal, unspecified: Principal | ICD-10-CM | POA: Diagnosis present

## 2012-12-15 DIAGNOSIS — F1994 Other psychoactive substance use, unspecified with psychoactive substance-induced mood disorder: Secondary | ICD-10-CM | POA: Diagnosis present

## 2012-12-15 DIAGNOSIS — F1010 Alcohol abuse, uncomplicated: Secondary | ICD-10-CM

## 2012-12-15 DIAGNOSIS — K754 Autoimmune hepatitis: Secondary | ICD-10-CM | POA: Diagnosis present

## 2012-12-15 DIAGNOSIS — G40909 Epilepsy, unspecified, not intractable, without status epilepticus: Secondary | ICD-10-CM

## 2012-12-15 DIAGNOSIS — D61818 Other pancytopenia: Secondary | ICD-10-CM | POA: Diagnosis present

## 2012-12-15 LAB — URINALYSIS, ROUTINE W REFLEX MICROSCOPIC
Glucose, UA: NEGATIVE mg/dL
Ketones, ur: NEGATIVE mg/dL
Nitrite: NEGATIVE
Protein, ur: 30 mg/dL — AB
Specific Gravity, Urine: 1.02 (ref 1.005–1.030)
Urobilinogen, UA: 1 mg/dL (ref 0.0–1.0)
pH: 8 (ref 5.0–8.0)

## 2012-12-15 LAB — APTT: aPTT: 41 seconds — ABNORMAL HIGH (ref 24–37)

## 2012-12-15 LAB — COMPREHENSIVE METABOLIC PANEL
Albumin: 1.9 g/dL — ABNORMAL LOW (ref 3.5–5.2)
BUN: 11 mg/dL (ref 6–23)
Calcium: 8.4 mg/dL (ref 8.4–10.5)
GFR calc Af Amer: >90 mL/min (ref >90)
Glucose, Bld: 109 mg/dL — ABNORMAL HIGH (ref 70–99)
Sodium: 136 mEq/L (ref 135–145)
Total Protein: 6.1 g/dL (ref 6.0–8.3)

## 2012-12-15 LAB — URINE MICROSCOPIC-ADD ON

## 2012-12-15 LAB — CBC WITH DIFFERENTIAL/PLATELET
Basophils Absolute: 0 10*3/uL (ref 0.0–0.1)
Eosinophils Absolute: 0.1 10*3/uL (ref 0.0–0.7)
HCT: 25.6 % — ABNORMAL LOW (ref 36.0–46.0)
Lymphocytes Relative: 11 % — ABNORMAL LOW (ref 12–46)
Lymphs Abs: 0.3 10*3/uL — ABNORMAL LOW (ref 0.7–4.0)
MCHC: 35.5 g/dL (ref 30.0–36.0)
Monocytes Relative: 6 % (ref 3–12)
Neutro Abs: 1.9 10*3/uL (ref 1.7–7.7)
Platelets: 36 10*3/uL — ABNORMAL LOW (ref 150–400)
RDW: 14.3 % (ref 11.5–15.5)
WBC: 2.5 10*3/uL — ABNORMAL LOW (ref 4.0–10.5)

## 2012-12-15 LAB — RAPID URINE DRUG SCREEN, HOSP PERFORMED
Amphetamines: NOT DETECTED
Benzodiazepines: NOT DETECTED
Opiates: NOT DETECTED
Tetrahydrocannabinol: NOT DETECTED

## 2012-12-15 LAB — PROTIME-INR: INR: 1.49 (ref 0.00–1.49)

## 2012-12-15 LAB — POCT PREGNANCY, URINE: Preg Test, Ur: NEGATIVE

## 2012-12-15 MED ORDER — ONDANSETRON HCL 4 MG PO TABS
4.0000 mg | ORAL_TABLET | Freq: Three times a day (TID) | ORAL | Status: DC | PRN
  Administered 2012-12-16: 4 mg via ORAL
  Filled 2012-12-15: qty 1

## 2012-12-15 MED ORDER — METOCLOPRAMIDE HCL 10 MG PO TABS
10.0000 mg | ORAL_TABLET | Freq: Four times a day (QID) | ORAL | Status: DC | PRN
  Administered 2012-12-15: 10 mg via ORAL
  Filled 2012-12-15 (×2): qty 1

## 2012-12-15 MED ORDER — ONDANSETRON HCL 4 MG/2ML IJ SOLN
4.0000 mg | Freq: Once | INTRAMUSCULAR | Status: AC
  Administered 2012-12-15: 4 mg via INTRAVENOUS
  Filled 2012-12-15: qty 2

## 2012-12-15 MED ORDER — AZATHIOPRINE 50 MG PO TABS
50.0000 mg | ORAL_TABLET | Freq: Every day | ORAL | Status: DC
  Administered 2012-12-15 – 2012-12-17 (×3): 50 mg via ORAL
  Filled 2012-12-15 (×3): qty 1

## 2012-12-15 MED ORDER — FUROSEMIDE 80 MG PO TABS
120.0000 mg | ORAL_TABLET | Freq: Two times a day (BID) | ORAL | Status: DC
  Administered 2012-12-15 – 2012-12-17 (×5): 120 mg via ORAL
  Filled 2012-12-15 (×7): qty 1

## 2012-12-15 MED ORDER — LORAZEPAM 1 MG PO TABS: 0.0000 mg | ORAL_TABLET | Freq: Two times a day (BID) | ORAL | Status: DC

## 2012-12-15 MED ORDER — ADULT MULTIVITAMIN W/MINERALS CH
1.0000 | ORAL_TABLET | Freq: Every day | ORAL | Status: DC
  Administered 2012-12-15 – 2012-12-17 (×3): 1 via ORAL
  Filled 2012-12-15 (×3): qty 1

## 2012-12-15 MED ORDER — ZOLPIDEM TARTRATE 5 MG PO TABS: 5.0000 mg | ORAL_TABLET | Freq: Every evening | ORAL | Status: DC | PRN

## 2012-12-15 MED ORDER — SODIUM CHLORIDE 0.9 % IV BOLUS (SEPSIS)
1000.0000 mL | Freq: Once | INTRAVENOUS | Status: AC
  Administered 2012-12-15: 1000 mL via INTRAVENOUS

## 2012-12-15 MED ORDER — OXYCODONE HCL 5 MG PO TABS
5.0000 mg | ORAL_TABLET | Freq: Four times a day (QID) | ORAL | Status: DC | PRN
  Administered 2012-12-16: 5 mg via ORAL
  Filled 2012-12-15: qty 1

## 2012-12-15 MED ORDER — VITAMIN B-1 100 MG PO TABS
100.0000 mg | ORAL_TABLET | Freq: Every day | ORAL | Status: DC
  Administered 2012-12-15 – 2012-12-17 (×3): 100 mg via ORAL
  Filled 2012-12-15 (×3): qty 1

## 2012-12-15 MED ORDER — HYDROMORPHONE HCL PF 1 MG/ML IJ SOLN
0.5000 mg | Freq: Once | INTRAMUSCULAR | Status: AC
  Administered 2012-12-15: 0.5 mg via INTRAVENOUS
  Filled 2012-12-15: qty 1

## 2012-12-15 MED ORDER — ONDANSETRON 4 MG PO TBDP: 4.0000 mg | ORAL_TABLET | Freq: Four times a day (QID) | ORAL | Status: DC | PRN

## 2012-12-15 MED ORDER — THIAMINE HCL 100 MG/ML IJ SOLN
100.0000 mg | Freq: Every day | INTRAMUSCULAR | Status: DC
  Filled 2012-12-15 (×2): qty 1

## 2012-12-15 MED ORDER — FOLIC ACID 1 MG PO TABS
1.0000 mg | ORAL_TABLET | Freq: Every day | ORAL | Status: DC
  Administered 2012-12-15 – 2012-12-17 (×3): 1 mg via ORAL
  Filled 2012-12-15 (×3): qty 1

## 2012-12-15 MED ORDER — NADOLOL 20 MG PO TABS
20.0000 mg | ORAL_TABLET | Freq: Every day | ORAL | Status: DC
  Administered 2012-12-15 – 2012-12-16 (×2): 20 mg via ORAL
  Filled 2012-12-15 (×5): qty 1

## 2012-12-15 MED ORDER — NICOTINE 21 MG/24HR TD PT24
21.0000 mg | MEDICATED_PATCH | Freq: Every day | TRANSDERMAL | Status: DC
  Filled 2012-12-15 (×2): qty 1

## 2012-12-15 MED ORDER — SUCRALFATE 1 GM/10ML PO SUSP
1.0000 g | Freq: Two times a day (BID) | ORAL | Status: DC | PRN
  Filled 2012-12-15: qty 10

## 2012-12-15 MED ORDER — LORAZEPAM 2 MG/ML IJ SOLN
1.0000 mg | Freq: Once | INTRAMUSCULAR | Status: AC
  Administered 2012-12-15: 1 mg via INTRAVENOUS
  Filled 2012-12-15: qty 1

## 2012-12-15 MED ORDER — SPIRONOLACTONE 100 MG PO TABS
100.0000 mg | ORAL_TABLET | Freq: Every day | ORAL | Status: DC
  Administered 2012-12-15 – 2012-12-17 (×3): 100 mg via ORAL
  Filled 2012-12-15 (×3): qty 1

## 2012-12-15 MED ORDER — HYDROXYZINE HCL 25 MG PO TABS
25.0000 mg | ORAL_TABLET | Freq: Four times a day (QID) | ORAL | Status: DC | PRN
  Filled 2012-12-15: qty 1

## 2012-12-15 MED ORDER — SODIUM CHLORIDE 0.9 % IV SOLN
INTRAVENOUS | Status: DC
  Administered 2012-12-15: via INTRAVENOUS

## 2012-12-15 MED ORDER — LORAZEPAM 1 MG PO TABS: 1.0000 mg | ORAL_TABLET | Freq: Four times a day (QID) | ORAL | Status: DC | PRN

## 2012-12-15 MED ORDER — PANTOPRAZOLE SODIUM 40 MG PO TBEC
40.0000 mg | DELAYED_RELEASE_TABLET | Freq: Every day | ORAL | Status: DC
  Administered 2012-12-15 – 2012-12-17 (×3): 40 mg via ORAL
  Filled 2012-12-15 (×3): qty 1

## 2012-12-15 MED ORDER — LEVETIRACETAM 750 MG PO TABS
750.0000 mg | ORAL_TABLET | Freq: Two times a day (BID) | ORAL | Status: DC
  Administered 2012-12-15 – 2012-12-17 (×4): 750 mg via ORAL
  Filled 2012-12-15 (×7): qty 1

## 2012-12-15 MED ORDER — LORAZEPAM 1 MG PO TABS
0.0000 mg | ORAL_TABLET | Freq: Four times a day (QID) | ORAL | Status: DC
  Administered 2012-12-15: 2 mg via ORAL
  Filled 2012-12-15: qty 2

## 2012-12-15 MED ORDER — ALUM & MAG HYDROXIDE-SIMETH 200-200-20 MG/5ML PO SUSP: 30.0000 mL | ORAL | Status: DC | PRN

## 2012-12-15 MED ORDER — PROMETHAZINE HCL 25 MG RE SUPP
25.0000 mg | Freq: Four times a day (QID) | RECTAL | Status: DC | PRN
  Filled 2012-12-15: qty 1

## 2012-12-15 MED ORDER — LACTULOSE 10 GM/15ML PO SOLN
20.0000 g | Freq: Every day | ORAL | Status: DC | PRN
  Filled 2012-12-15: qty 30

## 2012-12-15 MED ORDER — LORAZEPAM 2 MG/ML IJ SOLN: 1.0000 mg | Freq: Four times a day (QID) | INTRAMUSCULAR | Status: DC | PRN

## 2012-12-15 MED ORDER — POTASSIUM CHLORIDE CRYS ER 20 MEQ PO TBCR
20.0000 meq | EXTENDED_RELEASE_TABLET | Freq: Two times a day (BID) | ORAL | Status: DC
  Administered 2012-12-15 – 2012-12-17 (×4): 20 meq via ORAL
  Filled 2012-12-15 (×6): qty 1

## 2012-12-15 MED ORDER — LACTULOSE 20 GM/30ML PO SOLN: 30.0000 mL | Freq: Every day | ORAL | Status: DC | PRN

## 2012-12-15 NOTE — ED Notes (Signed)
Spoke to [facility] about medical concerns with pt having intracranial hemorrrhage associated with thrombocytopenia which she's currently followed by a doctor for and she has a seizure d/o with the last seizure being reported as occuring this morning. Her labs are unstable, she's jaundiced, has end stage 4 liver failure. AC recommended talking to [facility] here at [facility]. Spoke to [facility], she will have EDP come back and look at pt to determine if she needs to be moved back out to the main ed or not.

## 2012-12-15 NOTE — Telephone Encounter (Signed)
Ok She may resume Reglan if she feels that it helps with nausea

## 2012-12-15 NOTE — ED Notes (Signed)
Charge RN came back and looked up pt info in system, asked writer questions about her concerns, spoke with pt and will go talk to EDP about pt moving to the main ED.

## 2012-12-15 NOTE — ED Provider Notes (Signed)
History     CSN: [id number]  Arrival date & time 12/15/12  4098   First MD Initiated Contact with Patient 12/15/12 1027      Chief Complaint  Patient presents with  . Emesis    (Consider location/radiation/quality/duration/timing/severity/associated sxs/prior treatment) HPI  [name] is a 27 y.o. female past medical history significant for autoimmune hepatitis, cirrhosis, primary sclerosing cholangitis complaining of multiple episodes of nonbloody, nonbilious, non-coffee-ground emesis starting this a.m. It is associated with diffuse abdominal pain. Patient also endorses chest pain and shortness of breath that started approximately 20 minutes ago. Patient has been compliant with her medications including her seizure medications. She has been n.p.o. since last night. She was scheduled for a gastric emptying study as per her mother this a.m. Patient denies fever, diarrhea, constipation, abnormal vaginal discharge.  Past Medical History  Diagnosis Date  . Substance abuse April 2013    Cocaine plus THC usage - delisted from liver transplant list.   . Esophageal varices     distal esophageal grade 1 varices, portal gastropathy on EGDs in 01/2010 and 07/2012:  no banding or other intervention undertaken on EGDs from 2009 - 07/2012.    [name] Cirrhosis of liver   . Autoimmune hepatitis     Confirmed via liver biopsy 2002  . PSC (primary sclerosing cholangitis)   . Seizures     onset in 2009 associated with vent requiring resp failure.   . Thrombocytopenia     associated with cirrhosis.  followed by Dr Alcide Evener  . Intracranial hemorrhage 2009    in association with severe thrombocytopenia    Past Surgical History  Procedure Laterality Date  . Esophagogastroduodenoscopy  07/18/2012    Procedure: ESOPHAGOGASTRODUODENOSCOPY (EGD);  Surgeon: Louis Meckel, MD;  Location: [facility];  Service: Endoscopy;  Laterality: N/A;  . Gastric varices banding  07/18/2012    Procedure: GASTRIC  VARICES BANDING;  Surgeon: Louis Meckel, MD;  Location: [facility];  Service: Endoscopy;  Laterality: N/A;  . Ercp  01/2010    Sclerosing Cholangitis with no dominant stricures     Family History  Problem Relation Age of Onset  . Cancer Neg Hx     History  Substance Use Topics  . Smoking status: Former Smoker    Types: Cigarettes    Quit date: 08/09/2005  . Smokeless tobacco: Never Used  . Alcohol Use: No    OB History   Grav Para Term Preterm Abortions TAB SAB Ect Mult Living                  Review of Systems  Constitutional: Negative for fever.  Respiratory: Positive for shortness of breath.   Cardiovascular: Positive for chest pain.  Gastrointestinal: Positive for nausea, vomiting and abdominal pain. Negative for diarrhea.  All other systems reviewed and are negative.    Allergies  Ibuprofen  Home Medications   Current Outpatient Rx  Name  Route  Sig  Dispense  Refill  . azaTHIOprine (IMURAN) 50 MG tablet   Oral   Take 50 mg by mouth daily.          [name] CARAFATE 1 GM/10ML suspension      take 10 milliliters by mouth twice a day if needed for stomach pain   420 mL   0   . furosemide (LASIX) 40 MG tablet   Oral   Take 120 mg by mouth 2 (two) times daily.         [name] HYDROcodone-acetaminophen (NORCO)  5-325 MG per tablet   Oral   Take 1-2 tablets by mouth every 6 (six) hours as needed for pain.   30 tablet   0   . hydrOXYzine (ATARAX/VISTARIL) 25 MG tablet   Oral   Take 1 tablet (25 mg total) by mouth every 6 (six) hours as needed for itching.   21 tablet   0   . KLOR-CON M20 20 MEQ tablet      take 1 tablet by mouth twice a day   60 tablet   0   . Lactulose 20 GM/30ML SOLN   Oral   Take 30 mLs (20 g total) by mouth daily as needed. For constipation   500 mL   0   . levETIRAcetam (KEPPRA) 750 MG tablet   Oral   Take 1 tablet (750 mg total) by mouth every 12 (twelve) hours. Patient is supposed to take twice daily but only takes  once         . metoCLOPramide (REGLAN) 10 MG tablet      take 1 tablet by mouth four times a day   90 tablet   0   . nadolol (CORGARD) 20 MG tablet   Oral   Take 20 mg by mouth at bedtime.          . ondansetron (ZOFRAN-ODT) 4 MG disintegrating tablet   Oral   Take 4 mg by mouth every 6 (six) hours as needed. For nausea         . pantoprazole (PROTONIX) 40 MG tablet   Oral   Take 40 mg by mouth daily.          . promethazine (PHENERGAN) 25 MG suppository   Rectal   Place 25 mg rectally every 6 (six) hours as needed. For nausea         . spironolactone (ALDACTONE) 100 MG tablet   Oral   Take 1 tablet (100 mg total) by mouth daily.   60 tablet   0   . EPINEPHrine (EPI-PEN) 0.3 mg/0.3 mL DEVI   Intramuscular   Inject 0.3 mg into the muscle once.           BP 132/67  Pulse 89  Temp(Src) 97.9 F (36.6 C) (Oral)  Resp 16  SpO2 100%  Physical Exam  Nursing note and vitals reviewed. Constitutional: She is oriented to person, place, and time. She appears well-developed and well-nourished. No distress.  HENT:  Head: Normocephalic and atraumatic.  Mouth/Throat: Oropharynx is clear and moist.  Eyes: Conjunctivae and EOM are normal. Pupils are equal, round, and reactive to light. Scleral icterus is present.  Neck: Normal range of motion. No JVD present.  Cardiovascular: Normal rate.   Pulmonary/Chest: Effort normal and breath sounds normal. No stridor. No respiratory distress. She has no wheezes. She has no rales. She exhibits no tenderness.  Abdominal: There is no rebound and no guarding.  Decreased bowel sounds, patient is tender to palpation in the right upper, left upper and left lower quadrant. There is voluntary guarding, no rebound.  Musculoskeletal: Normal range of motion.  Neurological: She is alert and oriented to person, place, and time.  Eyes occasionally deviate superiorly. Patient has jerking motion in torso and neck.   Psychiatric: She has a  normal mood and affect.    ED Course  Procedures (including critical care time)  Labs Reviewed  URINALYSIS, ROUTINE W REFLEX MICROSCOPIC - Abnormal; Notable for the following:    Color, Urine ORANGE (*)  Hgb urine dipstick SMALL (*)    Bilirubin Urine MODERATE (*)    Protein, ur 30 (*)    Leukocytes, UA SMALL (*)    All other components within normal limits  CBC WITH DIFFERENTIAL - Abnormal; Notable for the following:    WBC 2.5 (*)    RBC 2.47 (*)    Hemoglobin 9.1 (*)    HCT 25.6 (*)    MCV 103.6 (*)    MCH 36.8 (*)    Platelets 36 (*)    Neutrophils Relative 81 (*)    Lymphocytes Relative 11 (*)    Lymphs Abs 0.3 (*)    All other components within normal limits  COMPREHENSIVE METABOLIC PANEL - Abnormal; Notable for the following:    Potassium 3.3 (*)    Glucose, Bld 109 (*)    Albumin 1.9 (*)    AST 314 (*)    ALT 146 (*)    Alkaline Phosphatase 885 (*)    Total Bilirubin 9.9 (*)    All other components within normal limits  URINE RAPID DRUG SCREEN (HOSP PERFORMED) - Abnormal; Notable for the following:    Cocaine POSITIVE (*)    All other components within normal limits  URINE MICROSCOPIC-ADD ON - Abnormal; Notable for the following:    Squamous Epithelial / LPF FEW (*)    Bacteria, UA FEW (*)    Casts HYALINE CASTS (*)    All other components within normal limits  AMMONIA - Abnormal; Notable for the following:    Ammonia 63 (*)    All other components within normal limits  ETHANOL - Abnormal; Notable for the following:    Alcohol, Ethyl (B) 50 (*)    All other components within normal limits  PROTIME-INR - Abnormal; Notable for the following:    Prothrombin Time 17.6 (*)    All other components within normal limits  APTT - Abnormal; Notable for the following:    aPTT 41 (*)    All other components within normal limits  URINE CULTURE  LIPASE, BLOOD  POCT PREGNANCY, URINE   Dg Abd Acute W/chest  12/15/2012  *RADIOLOGY REPORT*  Clinical Data: Vomiting   ACUTE ABDOMEN SERIES (ABDOMEN 2 VIEW & CHEST 1 VIEW)  Comparison: 11/26/2012  Findings: The patient is rotated to the left on today's exam, resulting in reduced diagnostic sensitivity and specificity.   Left side down lateral decubitus view of the abdomen demonstrates no free peritoneal gas or significant abnormal air-fluid levels.  Scattered small amounts of gas are noted in the colon.  Small bowel primarily gasless.  IMPRESSION:  1.  Gasless and nonspecific small bowel.  Fluid filled dilated loops cannot be excluded, although most commonly gasless bowel is incidental. 2.  No free intraperitoneal gas.   Original Report Authenticated By: Gaylyn Rong, M.D.     Date: 12/15/2012  Rate: 78  Rhythm: normal sinus rhythm and premature ventricular contractions (PVC)  QRS Axis: left   Intervals: QT prolonged borderline at QTC of 478.  ST/T Wave abnormalities: nonspecific T wave changes inversion in aVL and V2  Conduction Disutrbances:none  Narrative Interpretation:   Old EKG Reviewed: unchanged    1. Alcohol abuse   2. Autoimmune hepatitis   3. Nausea and vomiting in adult   4. Seizure disorder       MDM   [name] is a 27 y.o. female with autoimmune hepatitis, cirrhosis, primary sclerosing cholangitis, seizure disorder complaining of abdominal pain, nausea vomiting, chest pain and possible seizure.  Patient does appear to be having a non-grand mal seizure at initial physical exam. Ativan ordered.  Abdominal exam shows diffuse tenderness with no guarding or rebound.   She has low blood cells in all the cell lines, however, this is not atypical for her baseline. Patient has elevated liver function tests also consistent with her baseline. Ammonia level is 63 also consistent with her baseline and she has elevated clotting test.   Ethanol level is 50. I have advised the patient that it is critically important that she stop drinking. She requests help in doing so. She states that she  drinks over a pint of alcohol a day and she has done that since she was 27 years old. She says that this is not the cause of her liver failure and that her liver failure started at 13. She has not experienced any DTs from withdrawals, however, she states that he does shake.  Patient is medically cleared for psychiatric evaluation and alcohol detox placement  Patient is medically cleared for psychiatric evaluation, alcohol detox referral.. Home meds continued, psychiatric holding orders placed, act team consulted.   Filed Vitals:   12/15/12 0940  BP: 132/67  Pulse: 89  Temp: 97.9 F (36.6 C)  TempSrc: Oral  Resp: 16  SpO2: 100%      Wynetta Emery, PA-C 12/15/12 1814  Wynetta Emery, PA-C 12/15/12 1817

## 2012-12-15 NOTE — ED Notes (Signed)
CT came to get pt for scan. Dr Ranae Palms was here to examine her as well but just let her go to scan. Dr Ranae Palms said she's being transferred to [facility].

## 2012-12-15 NOTE — Telephone Encounter (Signed)
Pt may resume medication for nausea

## 2012-12-15 NOTE — ED Notes (Signed)
Grandmother, Alanson Puls, whom pt calls mom and lives with called to check on her and when she could visit and if she needed to bring pt anything. Gave her the visitation/phone guidelines.

## 2012-12-15 NOTE — ED Notes (Signed)
[facility] came to follow up on status, told her pt moving out to main ed.

## 2012-12-15 NOTE — ED Notes (Signed)
Per patient, started vomiting this am, has cirrhosis

## 2012-12-15 NOTE — ED Notes (Signed)
Pt came out of her triage room and said that her heart was hurting. EKG ordered per protocol.

## 2012-12-15 NOTE — ED Notes (Signed)
Pt reports she can't tell when she's about to have a seizure and the last one she had was here at the hospital today. She said she just starts shaking. Seizure pads put on her bed. Will notify AC.

## 2012-12-15 NOTE — ED Notes (Signed)
AVW:[id number]<[id number]> Expected date:<BR> Expected time:<BR> Means of arrival:<BR> Comments:<BR> Psych ED Bed 41

## 2012-12-15 NOTE — ED Notes (Signed)
Pt is back from CT

## 2012-12-15 NOTE — Telephone Encounter (Signed)
FYI Dr Arlyce Dice, Patient could not eat the eggs for the GES today

## 2012-12-15 NOTE — ED Notes (Signed)
Report given to Amy, RN, chart and meds sent from pharmacy taken and put in chart 14. Pt has no belongings here as her grandmother took them home. Pt transferred to room 14 via tech.

## 2012-12-15 NOTE — ED Notes (Signed)
Charge RN called and pt will be moving out to the main ed to room 14 after it's cleaned.

## 2012-12-16 DIAGNOSIS — R1011 Right upper quadrant pain: Secondary | ICD-10-CM

## 2012-12-16 DIAGNOSIS — G40909 Epilepsy, unspecified, not intractable, without status epilepticus: Secondary | ICD-10-CM

## 2012-12-16 DIAGNOSIS — R112 Nausea with vomiting, unspecified: Secondary | ICD-10-CM

## 2012-12-16 DIAGNOSIS — K7460 Unspecified cirrhosis of liver: Secondary | ICD-10-CM

## 2012-12-16 DIAGNOSIS — T510X1A Toxic effect of ethanol, accidental (unintentional), initial encounter: Secondary | ICD-10-CM

## 2012-12-16 DIAGNOSIS — G8929 Other chronic pain: Secondary | ICD-10-CM

## 2012-12-16 DIAGNOSIS — I8500 Esophageal varices without bleeding: Secondary | ICD-10-CM

## 2012-12-16 DIAGNOSIS — D61818 Other pancytopenia: Secondary | ICD-10-CM

## 2012-12-16 DIAGNOSIS — F17200 Nicotine dependence, unspecified, uncomplicated: Secondary | ICD-10-CM

## 2012-12-16 DIAGNOSIS — K8309 Other cholangitis: Secondary | ICD-10-CM

## 2012-12-16 DIAGNOSIS — D696 Thrombocytopenia, unspecified: Secondary | ICD-10-CM

## 2012-12-16 DIAGNOSIS — F1010 Alcohol abuse, uncomplicated: Secondary | ICD-10-CM

## 2012-12-16 LAB — GLUCOSE, CAPILLARY: Glucose-Capillary: 88 mg/dL (ref 70–99)

## 2012-12-16 LAB — CBC
HCT: 21.5 % — ABNORMAL LOW (ref 36.0–46.0)
Hemoglobin: 7.6 g/dL — ABNORMAL LOW (ref 12.0–15.0)
MCV: 103.4 fL — ABNORMAL HIGH (ref 78.0–100.0)
RBC: 2.08 MIL/uL — ABNORMAL LOW (ref 3.87–5.11)
RDW: 14 % (ref 11.5–15.5)
WBC: 3.3 10*3/uL — ABNORMAL LOW (ref 4.0–10.5)

## 2012-12-16 LAB — CREATININE, SERUM
GFR calc Af Amer: >90 mL/min (ref >90)
GFR calc non Af Amer: >90 mL/min (ref >90)

## 2012-12-16 LAB — MRSA PCR SCREENING: MRSA by PCR: POSITIVE — AB

## 2012-12-16 MED ORDER — CHLORHEXIDINE GLUCONATE CLOTH 2 % EX PADS
6.0000 | MEDICATED_PAD | Freq: Every day | CUTANEOUS | Status: DC
  Administered 2012-12-17: 6 via TOPICAL

## 2012-12-16 MED ORDER — MUPIROCIN 2 % EX OINT
1.0000 "application " | TOPICAL_OINTMENT | Freq: Two times a day (BID) | CUTANEOUS | Status: DC
  Administered 2012-12-16 – 2012-12-17 (×3): 1 via NASAL
  Filled 2012-12-16 (×4): qty 22

## 2012-12-16 MED ORDER — SODIUM CHLORIDE 0.9 % IJ SOLN: 3.0000 mL | Freq: Two times a day (BID) | INTRAMUSCULAR | Status: DC

## 2012-12-16 MED ORDER — SODIUM CHLORIDE 0.9 % IV SOLN
INTRAVENOUS | Status: DC
  Administered 2012-12-16 (×3): via INTRAVENOUS

## 2012-12-16 MED ORDER — ACETAMINOPHEN 650 MG RE SUPP: 650.0000 mg | Freq: Four times a day (QID) | RECTAL | Status: DC | PRN

## 2012-12-16 MED ORDER — ACETAMINOPHEN 325 MG PO TABS
650.0000 mg | ORAL_TABLET | Freq: Four times a day (QID) | ORAL | Status: DC | PRN
  Administered 2012-12-16: 650 mg via ORAL
  Filled 2012-12-16: qty 2

## 2012-12-16 MED ORDER — HEPARIN SODIUM (PORCINE) 5000 UNIT/ML IJ SOLN
5000.0000 [IU] | Freq: Three times a day (TID) | INTRAMUSCULAR | Status: DC
  Administered 2012-12-16: 5000 [IU] via SUBCUTANEOUS
  Filled 2012-12-16 (×5): qty 1

## 2012-12-16 MED ORDER — LORAZEPAM 2 MG/ML IJ SOLN
1.0000 mg | Freq: Four times a day (QID) | INTRAMUSCULAR | Status: DC | PRN
  Administered 2012-12-16 – 2012-12-17 (×2): 1 mg via INTRAVENOUS
  Filled 2012-12-16: qty 1

## 2012-12-16 MED ORDER — LORAZEPAM 1 MG PO TABS
1.0000 mg | ORAL_TABLET | Freq: Four times a day (QID) | ORAL | Status: DC | PRN
  Administered 2012-12-16 – 2012-12-17 (×2): 1 mg via ORAL
  Filled 2012-12-16 (×2): qty 1

## 2012-12-16 NOTE — ED Notes (Signed)
Report given to Luke with [facility] 

## 2012-12-16 NOTE — H&P (Signed)
Family Medicine Teaching [facility] Admission History and Physical Service Pager: [phone]  Patient name: [name] Medical record number: [id number] Date of birth: 22-Dec-1985 Age: 27 y.o. Gender: female  Primary Care Provider: Lloyd Huger, MD  Chief Complaint: Nausea/vomiting, Alcohol abuse  Assessment and Plan: Becky Colan is a 27 y.o. year old female with PMH of primary sclerosis cholangitis, autoimmune hepatitis, non-alcoholic cirrhosis, seizure disorder, and thrombocytopenia who presents from [facility] with nausea, vomiting and recurrent abd pain.  At [facility], patient also found to have Blood ETOH of 50 and requested help with stopping drinking.  # Nausea, vomiting, Abdominal pain - This is a recurrent problem for the patient given PMH - Patient currently free of nausea, has no abdominal pain, and is eager to eat/drink.  Acute Abdominal Series unremarkable. - Zofran PRN nausea - IV fluids - NS @ 75 mL/hr - Clear liquid diet; Will advance as tolerated. - Oxycodone IR PRN for pain - Patient will need continued f/u with her GI physician, most likely in the outpatient setting  # Alcohol abuse - Blood Alcohol 50.  Patient AO x 3 and answering questions appropriately when encouraged/stimulated.   - CIWA protocol - SW consult for ETOH and Drug abuse (patient is also a known cocaine user) - Will monitor closely during admission  # Primary sclerosis cholangitis, autoimmune hepatitis, non-alcoholic cirrhosis - CMP reveals AST/ALT elevated from baseline, likely due to recent ETOH consumption; Otherwise labs appear stable. - Will continue home Lasix, Azathioprine, Lactulose, Spironolactone, and Nadalol.  # Seizure disorder - Continuing home Keppra - Patient reportedly had seizure in [facility].  If this recurs, would plan to obtain neurology consultation to help with continued management.  Could likely increase Keppra to 1000mg  BID. - Head CT negative for any current intracranial pathology  #  Thrombocytopenia - Appears at baseline - Will continue to monitor closely  FEN/GI: NS @ 75 mL/hr; Clear liquid diet Prophylaxis: Heparin SQ Disposition: Pending clinical improvement Code Status: Full code  History of Present Illness:  [name] is a 27 y.o. year old female PMH of primary sclerosis cholangitis, autoimmune hepatitis, non-alcoholic cirrhosis, seizure disorder, and thrombocytopenia who presents from [facility] with nausea, vomiting and recurrent abd pain.  Patient reports that she developed N/V and associated abdominal pain on Friday.  This persisted and so she presented to the ED for evaluation tonight.  Currently patient endorses no nausea, but reports she had episode of nonbloody, nonbilious emesis at [facility].  Patient denies chest pain, SOB, abdominal pain currently.  Patient does report that she has been drinking heavily recently and continues to use Cocaine on a daily basis. She would be interested in help with stopping this as she is aware it is not good for her liver.  Patient was on list to be evaluated for inpatient drug treatment but had a seizure while in the ED.  She has been having occasional problems with this and it was controlled with 4mg  ativan.  Due to this seizure and her liver condition, she was denied admission to behavioral health and was transferred to our service for further medical management.  Patient Active Problem List   Diagnosis Date Noted  . Toxic effect of ethanol 12/16/2012  . Abdominal pain, chronic, right upper quadrant 11/27/2012  . Anemia 10/02/2012  . Bilateral lower abdominal pain 07/23/2012  . Autoimmune hepatitis 06/22/2012  . Ascites 06/22/2012  . 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 Nausea and vomiting in adult 06/21/2011  . Hyponatremia 06/21/2011  . Edema leg 05/26/2011  .  Seizure disorder 03/26/2011  . ENCEPHALOPATHY-HEPATIC 09/18/2009  . Portal hypertension 09/18/2009  . INTRACRANIAL HEMORRHAGE 12/05/2007  . OTHER SPECIFIED DISEASE  OF HAIR&HAIR FOLLICLES 12/05/2007  . AMENORRHEA 06/29/2007  . THROMBOCYTOPENIA 10/06/2006  . TOBACCO DEPENDENCE 10/06/2006  . ESOPHAGEAL VARICES 10/06/2006  . HEPATIC CIRRHOSIS, NONALCOHOLIC 10/06/2006  . LIVER FAILURE 10/06/2006  . PAPANICOLAOU SMEAR, ABNORMAL 10/06/2006   Past Medical History: Past Medical History  Diagnosis Date  . Substance abuse April 2013    Cocaine plus THC usage - delisted from liver transplant list.   . Esophageal varices     distal esophageal grade 1 varices, portal gastropathy on EGDs in 01/2010 and 07/2012:  no banding or other intervention undertaken on EGDs from 2009 - 07/2012.    [name] Cirrhosis of liver   . Autoimmune hepatitis     Confirmed via liver biopsy 2002  . PSC (primary sclerosing cholangitis)   . Seizures     onset in 2009 associated with vent requiring resp failure.   . Thrombocytopenia     associated with cirrhosis.  followed by Dr Alcide Evener  . Intracranial hemorrhage 2009    in association with severe thrombocytopenia   Past Surgical History: Past Surgical History  Procedure Laterality Date  . Esophagogastroduodenoscopy  07/18/2012    Procedure: ESOPHAGOGASTRODUODENOSCOPY (EGD);  Surgeon: Louis Meckel, MD;  Location: [facility];  Service: Endoscopy;  Laterality: N/A;  . Gastric varices banding  07/18/2012    Procedure: GASTRIC VARICES BANDING;  Surgeon: Louis Meckel, MD;  Location: [facility];  Service: Endoscopy;  Laterality: N/A;  . Ercp  01/2010    Sclerosing Cholangitis with no dominant stricures    Social History: History  Substance Use Topics  . Smoking status: Former Smoker    Types: Cigarettes    Quit date: 08/09/2005  . Smokeless tobacco: Never Used  . Alcohol Use: No   For any additional social history documentation, please refer to relevant sections of EMR.  Family History: Family History  Problem Relation Age of Onset  . Cancer Neg Hx    Allergies: Allergies  Allergen Reactions  . Ibuprofen Shortness  Of Breath   No current facility-administered medications on file prior to encounter.   Current Outpatient Prescriptions on File Prior to Encounter  Medication Sig Dispense Refill  . azaTHIOprine (IMURAN) 50 MG tablet Take 50 mg by mouth daily.       [name] CARAFATE 1 GM/10ML suspension take 10 milliliters by mouth twice a day if needed for stomach pain  420 mL  0  . furosemide (LASIX) 40 MG tablet Take 120 mg by mouth 2 (two) times daily.      [name] HYDROcodone-acetaminophen (NORCO) 5-325 MG per tablet Take 1-2 tablets by mouth every 6 (six) hours as needed for pain.  30 tablet  0  . hydrOXYzine (ATARAX/VISTARIL) 25 MG tablet Take 1 tablet (25 mg total) by mouth every 6 (six) hours as needed for itching.  21 tablet  0  . KLOR-CON M20 20 MEQ tablet take 1 tablet by mouth twice a day  60 tablet  0  . Lactulose 20 GM/30ML SOLN Take 30 mLs (20 g total) by mouth daily as needed. For constipation  500 mL  0  . levETIRAcetam (KEPPRA) 750 MG tablet Take 1 tablet (750 mg total) by mouth every 12 (twelve) hours. Patient is supposed to take twice daily but only takes once      . metoCLOPramide (REGLAN) 10 MG tablet take 1 tablet by mouth four  times a day  90 tablet  0  . nadolol (CORGARD) 20 MG tablet Take 20 mg by mouth at bedtime.       . ondansetron (ZOFRAN-ODT) 4 MG disintegrating tablet Take 4 mg by mouth every 6 (six) hours as needed. For nausea      . pantoprazole (PROTONIX) 40 MG tablet Take 40 mg by mouth daily.       . promethazine (PHENERGAN) 25 MG suppository Place 25 mg rectally every 6 (six) hours as needed. For nausea      . spironolactone (ALDACTONE) 100 MG tablet Take 1 tablet (100 mg total) by mouth daily.  60 tablet  0  . EPINEPHrine (EPI-PEN) 0.3 mg/0.3 mL DEVI Inject 0.3 mg into the muscle once.       Review Of Systems: Per HPI. Otherwise 12 point review of systems was performed and was unremarkable.  Physical Exam: BP 104/54  Pulse 99  Temp(Src) 98.7 F (37.1 C) (Oral)  Resp 12  Ht  5\' 4"  (1.626 m)  Wt 147 lb 14.9 oz (67.1 kg)  BMI 25.38 kg/m2  SpO2 99% Exam: General: appears sleepy, but arouses easily and answers questions appropriately. HEENT: NCAT. Scleral icterus noted. Cardiovascular: RRR. No murmurs, rubs, or gallops. Respiratory: CTAB. Abdomen: soft, nontender, nondistended. Extremities: warm, well perfused. Trace LE edema. Skin: warm, dry, intact. Neuro: Appears drowsy, but arouses easily.  AO X 3. Answers questions appropriately.  No focal deficits on exam.  Labs and Imaging: CBC    Component Value Date/Time   WBC 2.5* 12/15/2012 1150   WBC 4.7 08/14/2012 1555   RBC 2.47* 12/15/2012 1150   RBC 2.81* 08/14/2012 1555   HGB 9.1* 12/15/2012 1150   HGB 11.2* 08/14/2012 1555   HCT 25.6* 12/15/2012 1150   HCT 32.1* 08/14/2012 1555   PLT 36* 12/15/2012 1150   PLT 63* 08/14/2012 1555   MCV 103.6* 12/15/2012 1150   MCV 114.2* 08/14/2012 1555   MCH 36.8* 12/15/2012 1150   MCH 39.8* 08/14/2012 1555   MCHC 35.5 12/15/2012 1150   MCHC 34.8 08/14/2012 1555   RDW 14.3 12/15/2012 1150   RDW 14.5 08/14/2012 1555   LYMPHSABS 0.3* 12/15/2012 1150   LYMPHSABS 0.6* 08/14/2012 1555   MONOABS 0.2 12/15/2012 1150   MONOABS 0.4 08/14/2012 1555   EOSABS 0.1 12/15/2012 1150   EOSABS 0.1 08/14/2012 1555   BASOSABS 0.0 12/15/2012 1150   BASOSABS 0.0 08/14/2012 1555    CMP     Component Value Date/Time   NA 136 12/15/2012 1150   NA 133* 08/07/2012 1422   K 3.3* 12/15/2012 1150   K 3.9 08/07/2012 1422   CL 100 12/15/2012 1150   CL 100 08/07/2012 1422   CO2 27 12/15/2012 1150   CO2 24 08/07/2012 1422   GLUCOSE 109* 12/15/2012 1150   GLUCOSE 224* 08/07/2012 1422   BUN 11 12/15/2012 1150   BUN 10.0 08/07/2012 1422   CREATININE 0.67 12/15/2012 1150   CREATININE 0.71 10/19/2012 1542   CREATININE 0.7 08/07/2012 1422   CALCIUM 8.4 12/15/2012 1150   CALCIUM 7.9* 08/07/2012 1422   PROT 6.1 12/15/2012 1150   PROT 5.5* 06/20/2012 1212   ALBUMIN 1.9* 12/15/2012 1150   ALBUMIN 1.5* 06/20/2012 1212   AST 314* 12/15/2012 1150   AST  239* 06/20/2012 1212   ALT 146* 12/15/2012 1150   ALT 67* 06/20/2012 1212   ALKPHOS 885* 12/15/2012 1150   ALKPHOS 1,104* 06/20/2012 1212   BILITOT 9.9* 12/15/2012 1150   BILITOT  17.47* 06/20/2012 1212   GFRNONAA >90 12/15/2012 1150   GFRAA >90 12/15/2012 1150   Ammonia - 63.  Lab Results  Component Value Date   INR 1.49 12/15/2012   INR 1.58* 11/28/2012   INR 1.63* 11/27/2012   PROTIME 16.8* 06/20/2012   Blood ETOH - 50.  Drugs of Abuse     Component Value Date/Time   LABOPIA NONE DETECTED 12/15/2012 1255   COCAINSCRNUR POSITIVE* 12/15/2012 1255   COCAINSCRNUR NEG 06/28/2011 1556   LABBENZ NONE DETECTED 12/15/2012 1255   LABBENZ NEG 06/28/2011 1556   AMPHETMU NONE DETECTED 12/15/2012 1255   AMPHETMU NEG 06/28/2011 1556   THCU NONE DETECTED 12/15/2012 1255   LABBARB NONE DETECTED 12/15/2012 1255     Ct Head Wo Contrast 12/15/2012  IMPRESSION: No acute finding.  Stable compared to prior exam.   Dg Abd Acute W/chest 12/15/2012 IMPRESSION:  1.  Gasless and nonspecific small bowel.  Fluid filled dilated loops cannot be excluded, although most commonly gasless bowel is incidental. 2.  No free intraperitoneal gas.   Everlene Other, DO 12/16/2012, 3:01 AM  I have seen, examined, and evaluated the patient and agree with Dr. Patsey Berthold assessment and plan as outlined above. Lillyonna Armstead 12/16/2012, 7:23 AM

## 2012-12-16 NOTE — ED Notes (Signed)
Patient belongings sent home with patient grandmother

## 2012-12-16 NOTE — ED Notes (Signed)
[facility] called for patient transfer 

## 2012-12-16 NOTE — ED Notes (Signed)
[facility] on site 

## 2012-12-16 NOTE — ED Provider Notes (Signed)
Medical screening examination/treatment/procedure(s) were performed by non-physician practitioner and as supervising physician I was immediately available for consultation/collaboration.   Gavin Pound. Oletta Lamas, MD 12/16/12 2130

## 2012-12-16 NOTE — Progress Notes (Signed)
Report called to [facility], receiving RN on 6N.  Pt transferred to 6N28 via wheelchair with all belongings. Pt's grandma, [name], aware of new room number.   Roselie Awkward, RN

## 2012-12-16 NOTE — H&P (Signed)
FMTS Attending Admission Note: [facility]ri Carneiro,MD I  have seen and examined this patient, reviewed their chart. I have discussed this patient with the resident. I agree with the resident's findings, assessment and care plan.  Briefly:  27 Y/O F with Liver cirrhosis,seizure disorder present to the ED from [facility] after an episode of seizure,prior to this patient stated she was having worsening N/V with stomach pain. Upon getting to [facility] her labs were abnormal hence was transferred to [facility]. She mentioned she was also supposed to be admitted for ETOH detox at [facility].Feels weak and tired this morning.  O/E: She is awake and alert,confortable in bed,not in distress. Neuro: Oriented X 3. No abnormality. HEENT: Sclera Icterus. CV/Resp: wnl. Abd: Soft,nontender,BS normal. Ext: No edema.  A/P 1. N/V Abdominal pain: From hx this seem to be recurrent. Might be related to liver pathology.    Continue IV hydration.    Agree with Zofran prn and pain med.    Advance diet as tolerated.    Please obtain stool Guaiac test.  2. Elevated Liver enz: Secondary to Hepatic cirrhosis which she has had since age 89.     She was on a Liver transplant list,trying to get back on this.     She sees Dr Kaplan/GI to f/u with his as out patient.  3. Cocaine and ETOH abuse: I spent time counseling patient on effect of ETOH on her Liver.     She is aware of effect and is open to detox.      SW to evaluate for setting up detox.     Continue CIWA protocol.  4. Seizure: No new episode since admission.     Continue home Keppra for now.      5. Pancytopenia: Likely secondary to Liver dx.     She was on promacta for low platelet,to confirm if still and med and restart.     Need to avoid Heparin for DVT prophylaxis in this patient with low platelet.     May use DVT boot instead.     Stool guaiac to r/o GI bleed although record indicated that she has chronic anemia.     Monitor CBC.  NB: CXRAY not  done on admission please obtain chest xray.

## 2012-12-16 NOTE — Progress Notes (Signed)
Pt admitted into room 3313 via [facility] from [facility] ED at 0100. She is arousable, follows commands, and her VSS. CIWA score is currently a 4. MD notified about transfer, CCMD and ELINK also notified. Dr. Adriana Simas at bedside to assess patient and discuss plan of care. Will continue to monitor closely.

## 2012-12-16 NOTE — ED Notes (Signed)
Per patient request called patient grandmother Alanson Puls at [phone], no answer, message left requesting a call back.

## 2012-12-17 ENCOUNTER — Inpatient Hospital Stay (HOSPITAL_COMMUNITY): Payer: Medicare Other

## 2012-12-17 DIAGNOSIS — F1994 Other psychoactive substance use, unspecified with psychoactive substance-induced mood disorder: Secondary | ICD-10-CM

## 2012-12-17 LAB — COMPREHENSIVE METABOLIC PANEL
ALT: 140 U/L — ABNORMAL HIGH (ref 0–35)
AST: 361 U/L — ABNORMAL HIGH (ref 0–37)
Albumin: 1.5 g/dL — ABNORMAL LOW (ref 3.5–5.2)
CO2: 22 mEq/L (ref 19–32)
Calcium: 7.5 mg/dL — ABNORMAL LOW (ref 8.4–10.5)
Sodium: 134 mEq/L — ABNORMAL LOW (ref 135–145)
Total Protein: 5.2 g/dL — ABNORMAL LOW (ref 6.0–8.3)

## 2012-12-17 LAB — URINE CULTURE: Colony Count: 15000

## 2012-12-17 LAB — CBC
MCH: 37.4 pg — ABNORMAL HIGH (ref 26.0–34.0)
MCHC: 34.8 g/dL (ref 30.0–36.0)
Platelets: 38 10*3/uL — ABNORMAL LOW (ref 150–400)
RBC: 2.11 MIL/uL — ABNORMAL LOW (ref 3.87–5.11)

## 2012-12-17 MED ORDER — FLUOXETINE HCL 20 MG PO CAPS
20.0000 mg | ORAL_CAPSULE | Freq: Every day | ORAL | Status: DC
  Filled 2012-12-17: qty 1

## 2012-12-17 NOTE — H&P (Signed)
Family Medicine Teaching Service Daily Progress Note Service Pager: [phone]  Patient name: [name] Medical record number: [id number] Date of birth: 04-Nov-1985 Age: 27 y.o. Gender: female  Primary Care Provider: Lloyd Huger, MD  Subjective:  NAEO. Feeling better this am. No N/V or abd pain. Aware of significant medical comorbidities.   Assessment and Plan: [name] is a 27 y.o. year old female with PMH of primary sclerosis cholangitis, autoimmune hepatitis, non-alcoholic cirrhosis, seizure disorder, and thrombocytopenia who presents from [facility] with nausea, vomiting and recurrent abd pain.  At [facility], patient also found to have Blood ETOH of 50 and requested help with stopping drinking.  # Nausea, vomiting, Abdominal pain. Resolved. Acute Abdominal Series unremarkable.  Patient will need continued f/u with her GI physician, most likely in the outpatient setting - Zofran PRN nausea - IV fluids - NS @ 75 mL/hr - Tolerating some PO, Will advance as tolerated. - Oxycodone IR PRN for pain  # Alcohol abuse - Blood Alcohol 50 on admission.  Patient AO x 3 and answering questions appropriately when encouraged/stimulated. Spoke to Psych team on call concerning pt condition. Pt to complete detox here at [facility] after 72 hrs.  - Continue CIWA protocol - SW consult for ETOH and Drug abuse (patient is also a known cocaine user) - Will monitor closely during admission  # Primary sclerosis cholangitis, autoimmune hepatitis, non-alcoholic cirrhosis - Numerous metabolic derangements that are baseline for pt w/ the exception of mild elevations secondary to recent ETOH use. Pt MELD score of >20 carries a 67mo mortality of ~20%. - Will continue home Lasix, Azathioprine, Lactulose, Spironolactone, and Nadalol.  # Seizure disorder. Head CT negative for any current intracranial pathology - Continuing home Keppra - Patient reportedly had seizure in [facility].  If this recurs, would plan to obtain neurology  consultation to help with continued management.  Could likely increase Keppra to 1000mg  BID.  # Thrombocytopenia - Appears at baseline - Will continue to monitor closely  FEN/GI: NS @ 75 mL/hr; Clear liquid diet Prophylaxis: Heparin SQ Disposition: Pending clinical improvement Code Status: Full code    Physical Exam: BP 110/50  Pulse 92  Temp(Src) 98.3 F (36.8 C) (Oral)  Resp 16  Ht 5\' 4"  (1.626 m)  Wt 147 lb 14.9 oz (67.1 kg)  BMI 25.38 kg/m2  SpO2 100% Exam: General: appears sleepy, but arouses easily and answers questions appropriately. HEENT: NCAT. Scleral icterus noted. Cardiovascular: RRR. No murmurs, rubs, or gallops. Respiratory: CTAB. Abdomen: soft, nontender, nondistended. Extremities: warm, well perfused. Trace LE edema. Skin: warm, dry, intact. Neuro: Appears drowsy, but arouses easily.  AO X 3. Answers questions appropriately.  No focal deficits on exam.  Labs and Imaging: CBC    Component Value Date/Time   WBC 2.6* 12/17/2012 0609   WBC 4.7 08/14/2012 1555   RBC 2.11* 12/17/2012 0609   RBC 2.81* 08/14/2012 1555   HGB 7.9* 12/17/2012 0609   HGB 11.2* 08/14/2012 1555   HCT 22.7* 12/17/2012 0609   HCT 32.1* 08/14/2012 1555   PLT 38* 12/17/2012 0609   PLT 63* 08/14/2012 1555   MCV 107.6* 12/17/2012 0609   MCV 114.2* 08/14/2012 1555   MCH 37.4* 12/17/2012 0609   MCH 39.8* 08/14/2012 1555   MCHC 34.8 12/17/2012 0609   MCHC 34.8 08/14/2012 1555   RDW 14.2 12/17/2012 0609   RDW 14.5 08/14/2012 1555   LYMPHSABS 0.3* 12/15/2012 1150   LYMPHSABS 0.6* 08/14/2012 1555   MONOABS 0.2 12/15/2012 1150   MONOABS 0.4  08/14/2012 1555   EOSABS 0.1 12/15/2012 1150   EOSABS 0.1 08/14/2012 1555   BASOSABS 0.0 12/15/2012 1150   BASOSABS 0.0 08/14/2012 1555    CMP     Component Value Date/Time   NA 134* 12/17/2012 0609   NA 133* 08/07/2012 1422   K 3.1* 12/17/2012 0609   K 3.9 08/07/2012 1422   CL 101 12/17/2012 0609   CL 100 08/07/2012 1422   CO2 22 12/17/2012 0609   CO2 24 08/07/2012 1422    GLUCOSE 173* 12/17/2012 0609   GLUCOSE 224* 08/07/2012 1422   BUN 8 12/17/2012 0609   BUN 10.0 08/07/2012 1422   CREATININE 0.88 12/17/2012 0609   CREATININE 0.71 10/19/2012 1542   CREATININE 0.7 08/07/2012 1422   CALCIUM 7.5* 12/17/2012 0609   CALCIUM 7.9* 08/07/2012 1422   PROT 5.2* 12/17/2012 0609   PROT 5.5* 06/20/2012 1212   ALBUMIN 1.5* 12/17/2012 0609   ALBUMIN 1.5* 06/20/2012 1212   AST 361* 12/17/2012 0609   AST 239* 06/20/2012 1212   ALT 140* 12/17/2012 0609   ALT 67* 06/20/2012 1212   ALKPHOS 777* 12/17/2012 0609   ALKPHOS 1,104* 06/20/2012 1212   BILITOT 9.0* 12/17/2012 0609   BILITOT 17.47* 06/20/2012 1212   GFRNONAA 90* 12/17/2012 0609   GFRAA >90 12/17/2012 0609   Ammonia - 63.  Lab Results  Component Value Date   INR 1.49 12/15/2012   INR 1.58* 11/28/2012   INR 1.63* 11/27/2012   PROTIME 16.8* 06/20/2012   Blood ETOH - 50.  Drugs of Abuse     Component Value Date/Time   LABOPIA NONE DETECTED 12/15/2012 1255   COCAINSCRNUR POSITIVE* 12/15/2012 1255   COCAINSCRNUR NEG 06/28/2011 1556   LABBENZ NONE DETECTED 12/15/2012 1255   LABBENZ NEG 06/28/2011 1556   AMPHETMU NONE DETECTED 12/15/2012 1255   AMPHETMU NEG 06/28/2011 1556   THCU NONE DETECTED 12/15/2012 1255   LABBARB NONE DETECTED 12/15/2012 1255     Ct Head Wo Contrast 12/15/2012  IMPRESSION: No acute finding.  Stable compared to prior exam.   Dg Abd Acute W/chest 12/15/2012 IMPRESSION:  1.  Gasless and nonspecific small bowel.  Fluid filled dilated loops cannot be excluded, although most commonly gasless bowel is incidental. 2.  No free intraperitoneal gas.    Shelly Flatten, MD Family Medicine PGY-2 12/17/2012, 1:06 PM

## 2012-12-17 NOTE — H&P (Signed)
FMTS Attending Admission Note: PROGRESS NOTE Terri Eniola,MD I  have seen and examined this patient, reviewed their chart. I have discussed this patient with the resident. I agree with the resident's findings, assessment and care plan.

## 2012-12-17 NOTE — Consult Note (Signed)
Reason for Consult: alcohol abuse vs dependence and substance-induced mood disorder  Referring Physician: Dr. Marca Ancona Rojas is an 27 y.o. female.  HPI: Patient was seen and chart reviewed. Psychiatry consultation requested for the need of alcohol detox and rehabilitation services and emotional problems. Reportedly patient has nausea, vomiting and abdominal pain on arrival and has an episode of seizure while waiting for radiology procedure. Patient has no current symptoms of alcohol withdrawal and/ or intoxication. Patient reported she has been drinking vodka 1 bottle a day over 10 years, cocaine and marijuana. Patient denies legal problems associated with chemical dependency. Patient reported her biological mother was suffered with polysubstance abuse versus dependence and she was raised by her grandmother who she can't mother. Patient denied previous acute psychiatric hospitalization certainty dependency programs. Patient is interested to participate chemical dependency intensive outpatient program so that she can continue her schooling at [name] from which she will be expecting to graduate from this current academic year. Patient mother/grandmother requesting her to stop drinking but she was not able to buy herself. Patient denies previous history of witherawal symptoms or delirium tremens. Patient has been with PMH of primary sclerosis cholangitis, autoimmune hepatitis, non-alcoholic cirrhosis, seizure disorder, and thrombocytopenia who presents from [name] with nausea, vomiting and recurrent abdominal pain. Patient have Blood ETOH of 50 on arrival to [name] and requested help with stopping drinking  Mental Status Examination: Patient appeared as per his stated age, fairly groomed, and maintaining good eye contact. Patient has depressed mood and his affect was constricted. He has normal rate, rhythm, and low volume of speech. His thought process is linear and goal directed. Patient has  endorsing passive suicidal, but denied Active suicidal ideation, homicidal ideations, intentions or plans. Patient has no evidence of auditory or visual hallucinations, delusions, and paranoia. Patient has fair to poor insight judgment and impulse control.2  Past Medical History  Diagnosis Date  . Substance abuse April 2013    Cocaine plus THC usage - delisted from liver transplant list.   . Esophageal varices     distal esophageal grade 1 varices, portal gastropathy on EGDs in 01/2010 and 07/2012:  no banding or other intervention undertaken on EGDs from 2009 - 07/2012.    [name] Cirrhosis of liver   . Autoimmune hepatitis     Confirmed via liver biopsy 2002  . PSC (primary sclerosing cholangitis)   . Seizures     onset in 2009 associated with vent requiring resp failure.   . Thrombocytopenia     associated with cirrhosis.  followed by [name]  . Intracranial hemorrhage 2009    in association with severe thrombocytopenia    Past Surgical History  Procedure Laterality Date  . Esophagogastroduodenoscopy  07/18/2012    Procedure: ESOPHAGOGASTRODUODENOSCOPY (EGD);  Surgeon: Terri Meckel, MD;  Location: [name];  Service: [facility];  Laterality: N/A;  . Gastric varices banding  07/18/2012    Procedure: GASTRIC VARICES BANDING;  Surgeon: Terri Meckel, MD;  Location: [name];  Service: [facility];  Laterality: N/A;  . Ercp  01/2010    Sclerosing Cholangitis with no dominant stricures     Family History  Problem Relation Age of Onset  . Cancer Neg Hx     Social History:  reports that she quit smoking about 7 years ago. Her smoking use included Cigarettes. She smoked 0.00 packs per day. She has never used smokeless tobacco. She reports that she uses illicit drugs (Cocaine). She reports that she does not  drink alcohol.  Allergies:  Allergies  Allergen Reactions  . Ibuprofen Shortness Of Breath    Medications: I have reviewed the patient's current medications.  Results  for orders placed during the hospital encounter of 12/15/12 (from the past 48 hour(s))  MRSA PCR SCREENING     Status: Abnormal   Collection Time    12/16/12  1:28 AM      Result Value Range   MRSA by PCR POSITIVE (*) NEGATIVE   Comment:            The GeneXpert MRSA Assay (FDA     approved for NASAL specimens     only), is one component of a     comprehensive MRSA colonization     surveillance program. It is not     intended to diagnose MRSA     infection nor to guide or     monitor treatment for     MRSA infections.     RESULT CALLED TO, READ BACK BY AND VERIFIED WITH:     [name] [employer] [id number] WILDERK  CBC     Status: Abnormal   Collection Time    12/16/12  2:28 AM      Result Value Range   WBC 3.3 (*) 4.0 - 10.5 K/uL   RBC 2.08 (*) 3.87 - 5.11 MIL/uL   Hemoglobin 7.6 (*) 12.0 - 15.0 g/dL   HCT 04.5 (*) 40.9 - 81.1 %   MCV 103.4 (*) 78.0 - 100.0 fL   MCH 36.5 (*) 26.0 - 34.0 pg   MCHC 35.3  30.0 - 36.0 g/dL   RDW 91.4  78.2 - 95.6 %   Platelets 37 (*) 150 - 400 K/uL   Comment: CONSISTENT WITH PREVIOUS RESULT     REPEATED TO VERIFY  CREATININE, SERUM     Status: None   Collection Time    12/16/12  2:28 AM      Result Value Range   Creatinine, Ser 0.77  0.50 - 1.10 mg/dL   GFR calc non Af Amer >90  >90 mL/min   GFR calc Af Amer >90  >90 mL/min   Comment:            The eGFR has been calculated     using the CKD EPI equation.     This calculation has not been     validated in all clinical     situations.     eGFR's persistently     <90 mL/min signify     possible Chronic Kidney Disease.  GLUCOSE, CAPILLARY     Status: None   Collection Time    12/16/12  8:03 AM      Result Value Range   Glucose-Capillary 88  70 - 99 mg/dL   Comment 1 Notify RN     Comment 2 Documented in Chart    CBC     Status: Abnormal   Collection Time    12/17/12  6:09 AM      Result Value Range   WBC 2.6 (*) 4.0 - 10.5 K/uL   RBC 2.11 (*) 3.87 - 5.11 MIL/uL   Hemoglobin 7.9 (*)  12.0 - 15.0 g/dL   HCT 21.3 (*) 08.6 - 57.8 %   MCV 107.6 (*) 78.0 - 100.0 fL   MCH 37.4 (*) 26.0 - 34.0 pg   MCHC 34.8  30.0 - 36.0 g/dL   RDW 46.9  62.9 - 52.8 %   Platelets 38 (*) 150 - 400 K/uL  Comment: CONSISTENT WITH PREVIOUS RESULT  COMPREHENSIVE METABOLIC PANEL     Status: Abnormal   Collection Time    12/17/12  6:09 AM      Result Value Range   Sodium 134 (*) 135 - 145 mEq/L   Potassium 3.1 (*) 3.5 - 5.1 mEq/L   Chloride 101  96 - 112 mEq/L   CO2 22  19 - 32 mEq/L   Glucose, Bld 173 (*) 70 - 99 mg/dL   BUN 8  6 - 23 mg/dL   Creatinine, Ser 1.61  0.50 - 1.10 mg/dL   Calcium 7.5 (*) 8.4 - 10.5 mg/dL   Total Protein 5.2 (*) 6.0 - 8.3 g/dL   Albumin 1.5 (*) 3.5 - 5.2 g/dL   AST 096 (*) 0 - 37 U/L   ALT 140 (*) 0 - 35 U/L   Alkaline Phosphatase 777 (*) 39 - 117 U/L   Total Bilirubin 9.0 (*) 0.3 - 1.2 mg/dL   GFR calc non Af Amer 90 (*) >90 mL/min   GFR calc Af Amer >90  >90 mL/min   Comment:            The eGFR has been calculated     using the CKD EPI equation.     This calculation has not been     validated in all clinical     situations.     eGFR's persistently     <90 mL/min signify     possible Chronic Kidney Disease.    Ct Head Wo Contrast  12/15/2012  *RADIOLOGY REPORT*  Clinical Data: Nausea and vomiting.  CT HEAD WITHOUT CONTRAST  Technique:  Contiguous axial images were obtained from the base of the skull through the vertex without contrast.  Comparison: Head CT scan 08/08/2009 and brain MRI 08/09/2009.  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 No evidence of acute abnormality including infarction, hemorrhage, mass lesion, mass effect, midline shift or abnormal extra-axial fluid collection is identified.  There is no hydrocephalus or pneumocephalus.  The calvarium is intact.  IMPRESSION: No acute finding.  Stable compared to prior exam.   Original Report Authenticated By: Holley Dexter, M.D.    Dg Chest Port 1 View  12/17/2012   *RADIOLOGY REPORT*  Clinical Data: Nausea and vomiting.  PORTABLE CHEST - 1 VIEW  Comparison: 12/15/2012  Findings: The lungs are clear with minimal bibasilar atelectasis present.  Overall aeration has improved since the prior study.  No focal infiltrates.  The heart size remains at the upper limits of normal without edema.  IMPRESSION: Improved aeration with minimal residual bibasilar atelectasis.   Original Report Authenticated By: Irish Lack, M.D.     Positive for anxiety, bad mood, depression, excessive alcohol consumption, illegal drug usage and sleep disturbance Blood pressure 97/84, pulse 85, temperature 98.7 F (37.1 C), temperature source Oral, resp. rate 14, height 5\' 4"  (1.626 m), weight 147 lb 14.9 oz (67.1 kg), SpO2 100.00%.   Assessment/Plan: Alcohol abuse vs dependence  Substance-Induced mood disorder  Recommendations: May start Fluoxetine 20 mg QD for depressed mood. Patient does not meet criteria for inpatient substance abuse treatment program and does not require detox treatment and will be referred to chemical dependency intensive outpatient program. Patient may quantify for chemical dependency intensive outpatient program at behavioral health hospital or [facility]. Referred to the psychiatric social services for additional referrals if needed.   [name],JANARDHAHA R. 12/17/2012, 4:21 PM

## 2012-12-17 NOTE — Discharge Summary (Signed)
Family Medicine Resident Discharge Summary  Patient ID: [name] [id number] 27 y.o. May 08, 1986  Admit date: 12/15/2012  Discharge date and time: 12/17/12 after 18:00  Admitting Physician: Janit Pagan, MD   Discharge Physician: Janit Pagan, MD  Admission Diagnoses: Alcohol abuse [305.00] Hyperammonemia [270.6] Seizure disorder [345.90] Autoimmune hepatitis [571.42] Nausea and vomiting in adult [787.01] Pancytopenia [284.19]  Discharge Diagnoses: Same  Admission Condition: fair  Discharged Condition: good  Indication for Admission: Alcohol withdrawal  Hospital Course: [name] is a 27 y.o. year old female with PMH of primary sclerosis cholangitis, autoimmune hepatitis, non-alcoholic cirrhosis, seizure disorder, and thrombocytopenia who presents from [facility] with nausea, vomiting and recurrent abd pain. At [facility], patient also found to have Blood ETOH of 50 and requested help with stopping drinking.  # Nausea, Vomiting, Abdominal pain: Resolved by time of admission. Chronic condition for pt. Pt w/ significant GI pathology lending to condition on top of ETOH abuse and withdrawal  # ETOH Abuse: blood Etoh level on admission of 50. CIWA protocol initiated at time of admission. SW and Psych were consulted. This is a significant concern for pt given her autoimmune hepatitis and primary sclerosing cholangitis. Pt expressed interest in quitting but refused inpt admission for ETOH sessation program. 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 Pt MELD score calculated and discussion had regarding severity of disease and real consequences of continued alcohol use. MELD score of 20. Discussion w/ Psych team on 5/11 revealed that pt was at least 72 hours out from last alcoholic drink, and had undergone long periods of time in the past w/o ETOH and psych felt that pt was not at risk for any significant withdrawal symptoms.  #  Primary sclerosing cholangitis and autoimmune hepatitis and thrombocytopenia: initial lab work on admission grossly abnormal from a routine perspective but at baseline for pt who has significant disease pathology. Pt w/ regular GI physician and will be set up to f/u immediately outpt when offices open tomorrow.    # Seizure: Apparent seizure in [facility] x1. Head CT was negative. Pt w/o seizure remainder of admission. Likely from possible withdrawal vs medical non-compliance. Pt home Keppra continued w/o incident. No real post-ictal state.   Consults: psychiatry  Significant Diagnostic Studies: See full list below  Discharge Exam: BP 110/50  Pulse 92  Temp(Src) 98.3 F (36.8 C) (Oral)  Resp 16  Ht 5\' 4"  (1.626 m)  Wt 147 lb 14.9 oz (67.1 kg)  BMI 25.38 kg/m2  SpO2 100%  Exam:  General: appears sleepy, but arouses easily and answers questions appropriately.  HEENT: NCAT. Scleral icterus noted.  Cardiovascular: RRR. No murmurs, rubs, or gallops.  Respiratory: CTAB.  Abdomen: soft, nontender, nondistended.  Extremities: warm, well perfused. Trace LE edema.  Skin: warm, dry, intact.  Neuro: Appears drowsy, but arouses easily. AO X 3. Answers questions appropriately. No focal deficits on exam.  Disposition: home  Patient Instructions:    Medication List    ASK your doctor about these medications       azaTHIOprine 50 MG tablet  Commonly known as:  IMURAN  Take 50 mg by mouth daily.     CARAFATE 1 GM/10ML suspension  Generic drug:  sucralfate  take 10 milliliters by mouth twice a day if needed for stomach pain     EPINEPHrine 0.3 mg/0.3 mL Devi  Commonly known as:  EPI-PEN  Inject 0.3 mg into the muscle once.  furosemide 40 MG tablet  Commonly known as:  LASIX  Take 120 mg by mouth 2 (two) times daily.     HYDROcodone-acetaminophen 5-325 MG per tablet  Commonly known as:  NORCO  Take 1-2 tablets by mouth every 6 (six) hours as needed for pain.     hydrOXYzine 25 MG  tablet  Commonly known as:  ATARAX/VISTARIL  Take 1 tablet (25 mg total) by mouth every 6 (six) hours as needed for itching.     KLOR-CON M20 20 MEQ tablet  Generic drug:  potassium chloride SA  take 1 tablet by mouth twice a day     Lactulose 20 GM/30ML Soln  Take 30 mLs (20 g total) by mouth daily as needed. For constipation     levETIRAcetam 750 MG tablet  Commonly known as:  KEPPRA  Take 1 tablet (750 mg total) by mouth every 12 (twelve) hours. Patient is supposed to take twice daily but only takes once     metoCLOPramide 10 MG tablet  Commonly known as:  REGLAN  take 1 tablet by mouth four times a day     nadolol 20 MG tablet  Commonly known as:  CORGARD  Take 20 mg by mouth at bedtime.     ondansetron 4 MG disintegrating tablet  Commonly known as:  ZOFRAN-ODT  Take 4 mg by mouth every 6 (six) hours as needed. For nausea     pantoprazole 40 MG tablet  Commonly known as:  PROTONIX  Take 40 mg by mouth daily.     promethazine 25 MG suppository  Commonly known as:  PHENERGAN  Place 25 mg rectally every 6 (six) hours as needed. For nausea     spironolactone 100 MG tablet  Commonly known as:  ALDACTONE  Take 1 tablet (100 mg total) by mouth daily.        Activity: activity as tolerated   Follow-up with Dr. Ferdie Ping next week. Pt to call for appointment.  Follow-up Items: 1. Pt needs GI f/u appt ASAP 2. F/u w/ Psych recommendations for intensive oupt program for chemical dependency at the hospital or [facility]. Pt provided w/ contact information at time of DC.    Signed: Shelly Flatten, MD Family Medicine Resident PGY-2 [phone] 12/17/2012 5:57 PM     LABS:  Results for orders placed during the hospital encounter of 12/15/12 (from the past 72 hour(s))  CBC WITH DIFFERENTIAL     Status: Abnormal   Collection Time    12/15/12 11:50 AM      Result Value Range   WBC 2.5 (*) 4.0 - 10.5 K/uL   RBC 2.47 (*) 3.87 - 5.11 MIL/uL   Hemoglobin 9.1 (*) 12.0  - 15.0 g/dL   HCT 47.8 (*) 29.5 - 62.1 %   MCV 103.6 (*) 78.0 - 100.0 fL   MCH 36.8 (*) 26.0 - 34.0 pg   MCHC 35.5  30.0 - 36.0 g/dL   RDW 30.8  65.7 - 84.6 %   Platelets 36 (*) 150 - 400 K/uL   Comment: RESULT REPEATED AND VERIFIED     SPECIMEN CHECKED FOR CLOTS     CONSISTENT WITH PREVIOUS RESULT   Neutrophils Relative 81 (*) 43 - 77 %   Lymphocytes Relative 11 (*) 12 - 46 %   Monocytes Relative 6  3 - 12 %   Eosinophils Relative 2  0 - 5 %   Basophils Relative 0  0 - 1 %   Neutro Abs 1.9  1.7 -  7.7 K/uL   Lymphs Abs 0.3 (*) 0.7 - 4.0 K/uL   Monocytes Absolute 0.2  0.1 - 1.0 K/uL   Eosinophils Absolute 0.1  0.0 - 0.7 K/uL   Basophils Absolute 0.0  0.0 - 0.1 K/uL   Smear Review PLATELET COUNT CONFIRMED BY SMEAR    LIPASE, BLOOD     Status: None   Collection Time    12/15/12 11:50 AM      Result Value Range   Lipase 24  11 - 59 U/L  COMPREHENSIVE METABOLIC PANEL     Status: Abnormal   Collection Time    12/15/12 11:50 AM      Result Value Range   Sodium 136  135 - 145 mEq/L   Potassium 3.3 (*) 3.5 - 5.1 mEq/L   Chloride 100  96 - 112 mEq/L   CO2 27  19 - 32 mEq/L   Glucose, Bld 109 (*) 70 - 99 mg/dL   BUN 11  6 - 23 mg/dL   Creatinine, Ser 8.29  0.50 - 1.10 mg/dL   Calcium 8.4  8.4 - 56.2 mg/dL   Total Protein 6.1  6.0 - 8.3 g/dL   Albumin 1.9 (*) 3.5 - 5.2 g/dL   AST 130 (*) 0 - 37 U/L   ALT 146 (*) 0 - 35 U/L   Alkaline Phosphatase 885 (*) 39 - 117 U/L   Total Bilirubin 9.9 (*) 0.3 - 1.2 mg/dL   GFR calc non Af Amer >90  >90 mL/min   GFR calc Af Amer >90  >90 mL/min   Comment:            The eGFR has been calculated     using the CKD EPI equation.     This calculation has not been     validated in all clinical     situations.     eGFR's persistently     <90 mL/min signify     possible Chronic Kidney Disease.  URINALYSIS, ROUTINE W REFLEX MICROSCOPIC     Status: Abnormal   Collection Time    12/15/12 12:55 PM      Result Value Range   Color, Urine ORANGE  (*) YELLOW   Comment: BIOCHEMICALS MAY BE AFFECTED BY COLOR   APPearance CLEAR  CLEAR   Specific Gravity, Urine 1.020  1.005 - 1.030   pH 8.0  5.0 - 8.0   Glucose, UA NEGATIVE  NEGATIVE mg/dL   Hgb urine dipstick SMALL (*) NEGATIVE   Bilirubin Urine MODERATE (*) NEGATIVE   Ketones, ur NEGATIVE  NEGATIVE mg/dL   Protein, ur 30 (*) NEGATIVE mg/dL   Urobilinogen, UA 1.0  0.0 - 1.0 mg/dL   Nitrite NEGATIVE  NEGATIVE   Leukocytes, UA SMALL (*) NEGATIVE  URINE RAPID DRUG SCREEN (HOSP PERFORMED)     Status: Abnormal   Collection Time    12/15/12 12:55 PM      Result Value Range   Opiates NONE DETECTED  NONE DETECTED   Cocaine POSITIVE (*) NONE DETECTED   Benzodiazepines NONE DETECTED  NONE DETECTED   Amphetamines NONE DETECTED  NONE DETECTED   Tetrahydrocannabinol NONE DETECTED  NONE DETECTED   Barbiturates NONE DETECTED  NONE DETECTED   Comment:            DRUG SCREEN FOR MEDICAL PURPOSES     ONLY.  IF CONFIRMATION IS NEEDED     FOR ANY PURPOSE, NOTIFY LAB     WITHIN 5 DAYS.  LOWEST DETECTABLE LIMITS     FOR URINE DRUG SCREEN     Drug Class       Cutoff (ng/mL)     Amphetamine      1000     Barbiturate      200     Benzodiazepine   200     Tricyclics       300     Opiates          300     Cocaine          300     THC              50  URINE CULTURE     Status: None   Collection Time    12/15/12 12:55 PM      Result Value Range   Specimen Description URINE, CLEAN CATCH     Special Requests NONE     Culture  Setup Time 12/16/2012 00:32     Colony Count 15,000 COLONIES/ML     Culture       Value: Multiple bacterial morphotypes present, none predominant. Suggest appropriate recollection if clinically indicated.   Report Status 12/17/2012 FINAL    URINE MICROSCOPIC-ADD ON     Status: Abnormal   Collection Time    12/15/12 12:55 PM      Result Value Range   Squamous Epithelial / LPF FEW (*) RARE   WBC, UA 3-6  <3 WBC/hpf   RBC / HPF 3-6  <3 RBC/hpf    Bacteria, UA FEW (*) RARE   Casts HYALINE CASTS (*) NEGATIVE  POCT PREGNANCY, URINE     Status: None   Collection Time    12/15/12  1:00 PM      Result Value Range   Preg Test, Ur NEGATIVE  NEGATIVE   Comment:            THE SENSITIVITY OF THIS     METHODOLOGY IS >24 mIU/mL  AMMONIA     Status: Abnormal   Collection Time    12/15/12  2:41 PM      Result Value Range   Ammonia 63 (*) 11 - 60 umol/L   Comment: ICTERUS AT THIS LEVEL MAY AFFECT RESULT  ETHANOL     Status: Abnormal   Collection Time    12/15/12  2:41 PM      Result Value Range   Alcohol, Ethyl (B) 50 (*) 0 - 11 mg/dL   Comment:            LOWEST DETECTABLE LIMIT FOR     SERUM ALCOHOL IS 11 mg/dL     FOR MEDICAL PURPOSES ONLY  PROTIME-INR     Status: Abnormal   Collection Time    12/15/12  2:41 PM      Result Value Range   Prothrombin Time 17.6 (*) 11.6 - 15.2 seconds   INR 1.49  0.00 - 1.49  APTT     Status: Abnormal   Collection Time    12/15/12  2:41 PM      Result Value Range   aPTT 41 (*) 24 - 37 seconds   Comment:            IF BASELINE aPTT IS ELEVATED,     SUGGEST PATIENT RISK ASSESSMENT     BE USED TO DETERMINE APPROPRIATE     ANTICOAGULANT THERAPY.  MRSA PCR SCREENING     Status: Abnormal   Collection Time    12/16/12  1:28 AM      Result Value Range   MRSA by PCR POSITIVE (*) NEGATIVE   Comment:            The GeneXpert MRSA Assay (FDA     approved for NASAL specimens     only), is one component of a     comprehensive MRSA colonization     surveillance program. It is not     intended to diagnose MRSA     infection nor to guide or     monitor treatment for     MRSA infections.     RESULT CALLED TO, READ BACK BY AND VERIFIED WITH:     [facility] [id number] WILDERK  CBC     Status: Abnormal   Collection Time    12/16/12  2:28 AM      Result Value Range   WBC 3.3 (*) 4.0 - 10.5 K/uL   RBC 2.08 (*) 3.87 - 5.11 MIL/uL   Hemoglobin 7.6 (*) 12.0 - 15.0 g/dL   HCT 91.4 (*) 78.2 - 95.6 %    MCV 103.4 (*) 78.0 - 100.0 fL   MCH 36.5 (*) 26.0 - 34.0 pg   MCHC 35.3  30.0 - 36.0 g/dL   RDW 21.3  08.6 - 57.8 %   Platelets 37 (*) 150 - 400 K/uL   Comment: CONSISTENT WITH PREVIOUS RESULT     REPEATED TO VERIFY  CREATININE, SERUM     Status: None   Collection Time    12/16/12  2:28 AM      Result Value Range   Creatinine, Ser 0.77  0.50 - 1.10 mg/dL   GFR calc non Af Amer >90  >90 mL/min   GFR calc Af Amer >90  >90 mL/min   Comment:            The eGFR has been calculated     using the CKD EPI equation.     This calculation has not been     validated in all clinical     situations.     eGFR's persistently     <90 mL/min signify     possible Chronic Kidney Disease.  GLUCOSE, CAPILLARY     Status: None   Collection Time    12/16/12  8:03 AM      Result Value Range   Glucose-Capillary 88  70 - 99 mg/dL   Comment 1 Notify RN     Comment 2 Documented in Chart    CBC     Status: Abnormal   Collection Time    12/17/12  6:09 AM      Result Value Range   WBC 2.6 (*) 4.0 - 10.5 K/uL   RBC 2.11 (*) 3.87 - 5.11 MIL/uL   Hemoglobin 7.9 (*) 12.0 - 15.0 g/dL   HCT 46.9 (*) 62.9 - 52.8 %   MCV 107.6 (*) 78.0 - 100.0 fL   MCH 37.4 (*) 26.0 - 34.0 pg   MCHC 34.8  30.0 - 36.0 g/dL   RDW 41.3  24.4 - 01.0 %   Platelets 38 (*) 150 - 400 K/uL   Comment: CONSISTENT WITH PREVIOUS RESULT  COMPREHENSIVE METABOLIC PANEL     Status: Abnormal   Collection Time    12/17/12  6:09 AM      Result Value Range   Sodium 134 (*) 135 - 145 mEq/L   Potassium 3.1 (*) 3.5 - 5.1 mEq/L   Chloride 101  96 - 112 mEq/L  CO2 22  19 - 32 mEq/L   Glucose, Bld 173 (*) 70 - 99 mg/dL   BUN 8  6 - 23 mg/dL   Creatinine, Ser 4.09  0.50 - 1.10 mg/dL   Calcium 7.5 (*) 8.4 - 10.5 mg/dL   Total Protein 5.2 (*) 6.0 - 8.3 g/dL   Albumin 1.5 (*) 3.5 - 5.2 g/dL   AST 811 (*) 0 - 37 U/L   ALT 140 (*) 0 - 35 U/L   Alkaline Phosphatase 777 (*) 39 - 117 U/L   Total Bilirubin 9.0 (*) 0.3 - 1.2 mg/dL   GFR calc  non Af Amer 90 (*) >90 mL/min   GFR calc Af Amer >90  >90 mL/min   Comment:            The eGFR has been calculated     using the CKD EPI equation.     This calculation has not been     validated in all clinical     situations.     eGFR's persistently     <90 mL/min signify     possible Chronic Kidney Disease.     Ct Head Wo Contrast  12/15/2012  *RADIOLOGY REPORT*  Clinical Data: Nausea and vomiting.  CT HEAD WITHOUT CONTRAST  Technique:  Contiguous axial images were obtained from the base of the skull through the vertex without contrast.  Comparison: Head CT scan 08/08/2009 and brain MRI 08/09/2009.  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 No evidence of acute abnormality including infarction, hemorrhage, mass lesion, mass effect, midline shift or abnormal extra-axial fluid collection is identified.  There is no hydrocephalus or pneumocephalus.  The calvarium is intact.  IMPRESSION: No acute finding.  Stable compared to prior exam.   Original Report Authenticated By: Holley Dexter, M.D.    Dg Chest Port 1 View  12/17/2012  *RADIOLOGY REPORT*  Clinical Data: Nausea and vomiting.  PORTABLE CHEST - 1 VIEW  Comparison: 12/15/2012  Findings: The lungs are clear with minimal bibasilar atelectasis present.  Overall aeration has improved since the prior study.  No focal infiltrates.  The heart size remains at the upper limits of normal without edema.  IMPRESSION: Improved aeration with minimal residual bibasilar atelectasis.   Original Report Authenticated By: Irish Lack, M.D.    Dg Abd Acute W/chest  12/15/2012  *RADIOLOGY REPORT*  Clinical Data: Vomiting  ACUTE ABDOMEN SERIES (ABDOMEN 2 VIEW & CHEST 1 VIEW)  Comparison: 11/26/2012  Findings: The patient is rotated to the left on today's exam, resulting in reduced diagnostic sensitivity and specificity.   Left side down lateral decubitus view of the abdomen demonstrates no free peritoneal gas or significant abnormal  air-fluid levels.  Scattered small amounts of gas are noted in the colon.  Small bowel primarily gasless.  IMPRESSION:  1.  Gasless and nonspecific small bowel.  Fluid filled dilated loops cannot be excluded, although most commonly gasless bowel is incidental. 2.  No free intraperitoneal gas.   Original Report Authenticated By: Gaylyn Rong, M.D.

## 2012-12-18 NOTE — Discharge Summary (Signed)
[facility] Attending Admission Note: Lanell Persons I  have seen and examined this patient, reviewed their chart. I have discussed this patient with the resident. I agree with the resident's findings, assessment and care plan.

## 2012-12-20 ENCOUNTER — Encounter (HOSPITAL_COMMUNITY): Payer: Self-pay | Admitting: Emergency Medicine

## 2012-12-20 ENCOUNTER — Other Ambulatory Visit: Payer: Self-pay

## 2012-12-20 ENCOUNTER — Emergency Department (HOSPITAL_COMMUNITY)
Admission: EM | Admit: 2012-12-20 | Discharge: 2012-12-20 | Disposition: A | Discharge status: Home / Self Care | Payer: Medicare Other | Source: Home / Self Care | Attending: Emergency Medicine | Admitting: Emergency Medicine

## 2012-12-20 DIAGNOSIS — R110 Nausea: Secondary | ICD-10-CM

## 2012-12-20 DIAGNOSIS — Z8669 Personal history of other diseases of the nervous system and sense organs: Secondary | ICD-10-CM | POA: Insufficient documentation

## 2012-12-20 DIAGNOSIS — R109 Unspecified abdominal pain: Secondary | ICD-10-CM | POA: Insufficient documentation

## 2012-12-20 DIAGNOSIS — Z8719 Personal history of other diseases of the digestive system: Secondary | ICD-10-CM | POA: Insufficient documentation

## 2012-12-20 DIAGNOSIS — R112 Nausea with vomiting, unspecified: Secondary | ICD-10-CM | POA: Insufficient documentation

## 2012-12-20 DIAGNOSIS — Z87891 Personal history of nicotine dependence: Secondary | ICD-10-CM | POA: Insufficient documentation

## 2012-12-20 DIAGNOSIS — R17 Unspecified jaundice: Secondary | ICD-10-CM | POA: Insufficient documentation

## 2012-12-20 DIAGNOSIS — F1910 Other psychoactive substance abuse, uncomplicated: Secondary | ICD-10-CM | POA: Insufficient documentation

## 2012-12-20 DIAGNOSIS — Z8619 Personal history of other infectious and parasitic diseases: Secondary | ICD-10-CM | POA: Insufficient documentation

## 2012-12-20 DIAGNOSIS — Z8679 Personal history of other diseases of the circulatory system: Secondary | ICD-10-CM | POA: Insufficient documentation

## 2012-12-20 DIAGNOSIS — Z3202 Encounter for pregnancy test, result negative: Secondary | ICD-10-CM | POA: Insufficient documentation

## 2012-12-20 DIAGNOSIS — Z862 Personal history of diseases of the blood and blood-forming organs and certain disorders involving the immune mechanism: Secondary | ICD-10-CM | POA: Insufficient documentation

## 2012-12-20 DIAGNOSIS — Z79899 Other long term (current) drug therapy: Secondary | ICD-10-CM | POA: Insufficient documentation

## 2012-12-20 DIAGNOSIS — R1110 Vomiting, unspecified: Secondary | ICD-10-CM

## 2012-12-20 LAB — COMPREHENSIVE METABOLIC PANEL
Albumin: 1.7 g/dL — ABNORMAL LOW (ref 3.5–5.2)
BUN: 11 mg/dL (ref 6–23)
Chloride: 97 mEq/L (ref 96–112)
Creatinine, Ser: 0.86 mg/dL (ref 0.50–1.10)
GFR calc Af Amer: >90 mL/min (ref >90)
Glucose, Bld: 119 mg/dL — ABNORMAL HIGH (ref 70–99)
Total Bilirubin: 11 mg/dL — ABNORMAL HIGH (ref 0.3–1.2)
Total Protein: 6.3 g/dL (ref 6.0–8.3)

## 2012-12-20 LAB — LIPASE, BLOOD: Lipase: 18 U/L (ref 11–59)

## 2012-12-20 LAB — CBC WITH DIFFERENTIAL/PLATELET
Basophils Relative: 1 % (ref 0–1)
Eosinophils Absolute: 0.1 10*3/uL (ref 0.0–0.7)
HCT: 27.3 % — ABNORMAL LOW (ref 36.0–46.0)
Hemoglobin: 9.6 g/dL — ABNORMAL LOW (ref 12.0–15.0)
MCH: 37.1 pg — ABNORMAL HIGH (ref 26.0–34.0)
MCHC: 35.2 g/dL (ref 30.0–36.0)
Monocytes Absolute: 0.3 10*3/uL (ref 0.1–1.0)
Monocytes Relative: 7 % (ref 3–12)

## 2012-12-20 LAB — URINALYSIS, ROUTINE W REFLEX MICROSCOPIC
Ketones, ur: NEGATIVE mg/dL
Nitrite: NEGATIVE
Urobilinogen, UA: 4 mg/dL — ABNORMAL HIGH (ref 0.0–1.0)
pH: 7.5 (ref 5.0–8.0)

## 2012-12-20 LAB — POCT PREGNANCY, URINE: Preg Test, Ur: NEGATIVE

## 2012-12-20 LAB — URINE MICROSCOPIC-ADD ON

## 2012-12-20 MED ORDER — PROMETHAZINE HCL 25 MG/ML IJ SOLN
25.0000 mg | Freq: Once | INTRAMUSCULAR | Status: AC
  Administered 2012-12-20: 25 mg via INTRAMUSCULAR
  Filled 2012-12-20: qty 1

## 2012-12-20 MED ORDER — METOCLOPRAMIDE HCL 10 MG PO TABS: 10.0000 mg | ORAL_TABLET | Freq: Three times a day (TID) | ORAL | Status: DC | PRN

## 2012-12-20 MED ORDER — SODIUM CHLORIDE 0.9 % IV BOLUS (SEPSIS)
1000.0000 mL | Freq: Once | INTRAVENOUS | Status: AC
  Administered 2012-12-20: 1000 mL via INTRAVENOUS

## 2012-12-20 MED ORDER — METOCLOPRAMIDE HCL 5 MG/ML IJ SOLN: 10.0000 mg | Freq: Once | INTRAMUSCULAR | Status: DC

## 2012-12-20 MED ORDER — PANTOPRAZOLE SODIUM 40 MG IV SOLR: 40.0000 mg | Freq: Once | INTRAVENOUS | Status: DC

## 2012-12-20 MED ORDER — ONDANSETRON 4 MG PO TBDP
8.0000 mg | ORAL_TABLET | Freq: Once | ORAL | Status: AC
  Administered 2012-12-20: 8 mg via ORAL
  Filled 2012-12-20: qty 2

## 2012-12-20 NOTE — ED Provider Notes (Signed)
History     CSN: [id number]  Arrival date & time 12/20/12  1621   First MD Initiated Contact with Patient 12/20/12 1711      Chief Complaint  Patient presents with  . Hematemesis    (Consider location/radiation/quality/duration/timing/severity/associated sxs/prior treatment) HPI Comments: 27 y.o. female past medical history significant for seizures, autoimmune hepatitis, cirrhosis, primary sclerosing cholangitis complaining of one episode of emesis with a few drops of bright red blood and subsequent vomiting with no blood for the past few hours prior to arrival. Associated with diffuse abdominal pain. Pt states she had a "mini seizure" today and the last seizure she had prior to that was while she was in the ED during her last visit. Pt was discharged a few days ago after presenting to ED with similar sx and being admitted for ETOH detox. This is a chronic problem for the pt. States her liver failure began at age 7 and she has been drinking daily since she was 53. States she is compliant with her Keppra and that she has not had any alcohol since her discharge.   Denies fever, chills, headaches, chest pain, shortness of breath, numbness.   Past Medical History  Diagnosis Date  . Substance abuse April 2013    Cocaine plus THC usage - delisted from liver transplant list.   . Esophageal varices     distal esophageal grade 1 varices, portal gastropathy on EGDs in 01/2010 and 07/2012:  no banding or other intervention undertaken on EGDs from 2009 - 07/2012.    [name] Cirrhosis of liver   . Autoimmune hepatitis     Confirmed via liver biopsy 2002  . PSC (primary sclerosing cholangitis)   . Seizures     onset in 2009 associated with vent requiring resp failure.   . Thrombocytopenia     associated with cirrhosis.  followed by Dr Alcide Evener  . Intracranial hemorrhage 2009    in association with severe thrombocytopenia    Past Surgical History  Procedure Laterality Date  .  Esophagogastroduodenoscopy  07/18/2012    Procedure: ESOPHAGOGASTRODUODENOSCOPY (EGD);  Surgeon: Louis Meckel, MD;  Location: [facility];  Service: Endoscopy;  Laterality: N/A;  . Gastric varices banding  07/18/2012    Procedure: GASTRIC VARICES BANDING;  Surgeon: Louis Meckel, MD;  Location: [facility];  Service: Endoscopy;  Laterality: N/A;  . Ercp  01/2010    Sclerosing Cholangitis with no dominant stricures     Family History  Problem Relation Age of Onset  . Cancer Neg Hx     History  Substance Use Topics  . Smoking status: Former Smoker    Types: Cigarettes    Quit date: 08/09/2005  . Smokeless tobacco: Never Used  . Alcohol Use: No    OB History   Grav Para Term Preterm Abortions TAB SAB Ect Mult Living                  Review of Systems  Constitutional: Negative for fever and diaphoresis.  HENT: Negative for neck pain and neck stiffness.   Eyes: Negative for visual disturbance.  Respiratory: Negative for apnea, chest tightness and shortness of breath.   Cardiovascular: Negative for chest pain and palpitations.  Gastrointestinal: Positive for nausea, vomiting and abdominal pain. Negative for diarrhea and constipation.       Central, diffuse abdominal pain. One episode of reported bright red blood (a few drops)  Genitourinary: Negative for dysuria, flank pain, vaginal bleeding, vaginal discharge and pelvic pain.  Musculoskeletal: Negative for gait problem.  Skin: Negative for rash.  Neurological: Negative for dizziness, weakness, light-headedness, numbness and headaches.    Allergies  Ibuprofen  Home Medications   Current Outpatient Rx  Name  Route  Sig  Dispense  Refill  . azaTHIOprine (IMURAN) 50 MG tablet   Oral   Take 50 mg by mouth daily.          [name] CARAFATE 1 GM/10ML suspension      take 10 milliliters by mouth twice a day if needed for stomach pain   420 mL   0   . EPINEPHrine (EPI-PEN) 0.3 mg/0.3 mL DEVI   Intramuscular   Inject  0.3 mg into the muscle once.         . furosemide (LASIX) 40 MG tablet   Oral   Take 120 mg by mouth 2 (two) times daily.         [name] HYDROcodone-acetaminophen (NORCO) 5-325 MG per tablet   Oral   Take 1-2 tablets by mouth every 6 (six) hours as needed for pain.   30 tablet   0   . hydrOXYzine (ATARAX/VISTARIL) 25 MG tablet   Oral   Take 1 tablet (25 mg total) by mouth every 6 (six) hours as needed for itching.   21 tablet   0   . KLOR-CON M20 20 MEQ tablet      take 1 tablet by mouth twice a day   60 tablet   0   . Lactulose 20 GM/30ML SOLN   Oral   Take 30 mLs (20 g total) by mouth daily as needed. For constipation   500 mL   0   . levETIRAcetam (KEPPRA) 750 MG tablet   Oral   Take 1 tablet (750 mg total) by mouth every 12 (twelve) hours. Patient is supposed to take twice daily but only takes once         . metoCLOPramide (REGLAN) 10 MG tablet      take 1 tablet by mouth four times a day   90 tablet   0   . nadolol (CORGARD) 20 MG tablet   Oral   Take 20 mg by mouth at bedtime.          . ondansetron (ZOFRAN-ODT) 4 MG disintegrating tablet   Oral   Take 4 mg by mouth every 6 (six) hours as needed. For nausea         . pantoprazole (PROTONIX) 40 MG tablet   Oral   Take 40 mg by mouth daily.          . promethazine (PHENERGAN) 25 MG suppository   Rectal   Place 25 mg rectally every 6 (six) hours as needed. For nausea         . spironolactone (ALDACTONE) 100 MG tablet   Oral   Take 1 tablet (100 mg total) by mouth daily.   60 tablet   0   . metoCLOPramide (REGLAN) 10 MG tablet   Oral   Take 1 tablet (10 mg total) by mouth 3 (three) times daily as needed (headache / nausea).   20 tablet   0     BP 125/72  Pulse 90  Temp(Src) 98.2 F (36.8 C) (Oral)  Resp 16  SpO2 100%  Physical Exam  Nursing note and vitals reviewed. Constitutional: She is oriented to person, place, and time. She appears well-developed and well-nourished. No  distress.  HENT:  Head: Normocephalic and atraumatic.  Eyes: Conjunctivae and  EOM are normal. Scleral icterus is present.  Neck: Normal range of motion. Neck supple.  No meningeal signs  Cardiovascular: Normal rate, regular rhythm and normal heart sounds.  Exam reveals no gallop and no friction rub.   No murmur heard. Pulmonary/Chest: Effort normal and breath sounds normal. No respiratory distress. She has no wheezes. She has no rales. She exhibits no tenderness.  Abdominal: Soft. She exhibits no distension. There is no tenderness. There is no rebound and no guarding.  Decreased bowel sounds, mild tenderness to palpation, no guarding, no pain at McBurney's point  Musculoskeletal: Normal range of motion. She exhibits no edema and no tenderness.  Neurological: She is alert and oriented to person, place, and time. No cranial nerve deficit.  Skin: Skin is warm and dry. She is not diaphoretic.  jaundiced  Psychiatric:  Argumentative, uncooperative at times.    ED Course  Procedures (including critical care time)  Labs Reviewed  CBC WITH DIFFERENTIAL - Abnormal; Notable for the following:    RBC 2.59 (*)    Hemoglobin 9.6 (*)    HCT 27.3 (*)    MCV 105.4 (*)    MCH 37.1 (*)    Platelets 45 (*)    Neutrophils Relative % 84 (*)    Lymphocytes Relative 7 (*)    Lymphs Abs 0.3 (*)    All other components within normal limits  COMPREHENSIVE METABOLIC PANEL - Abnormal; Notable for the following:    Sodium 132 (*)    Glucose, Bld 119 (*)    Albumin 1.7 (*)    AST 222 (*)    ALT 140 (*)    Alkaline Phosphatase 819 (*)    Total Bilirubin 11.0 (*)    All other components within normal limits  URINALYSIS, ROUTINE W REFLEX MICROSCOPIC - Abnormal; Notable for the following:    Color, Urine ORANGE (*)    APPearance CLOUDY (*)    Bilirubin Urine MODERATE (*)    Protein, ur 30 (*)    Urobilinogen, UA 4.0 (*)    Leukocytes, UA SMALL (*)    All other components within normal limits  URINE  MICROSCOPIC-ADD ON - Abnormal; Notable for the following:    Squamous Epithelial / LPF MANY (*)    Bacteria, UA FEW (*)    Casts HYALINE CASTS (*)    All other components within normal limits  URINE CULTURE  LIPASE, BLOOD  POCT I-STAT TROPONIN I  POCT PREGNANCY, URINE   No results found. Medications  pantoprazole (PROTONIX) injection 40 mg (not administered)  metoCLOPramide (REGLAN) injection 10 mg (not administered)  ondansetron (ZOFRAN-ODT) disintegrating tablet 8 mg (8 mg Oral Given 12/20/12 1648)  sodium chloride 0.9 % bolus 1,000 mL (1,000 mLs Intravenous New Bag/Given 12/20/12 1824)  promethazine (PHENERGAN) injection 25 mg (25 mg Intramuscular Given 12/20/12 1824)     1. Emesis   2. Nausea       MDM  27 y.o. female past medical history significant for seizures, autoimmune hepatitis, cirrhosis, primary sclerosing cholangitis complaining of one episode of emesis with a few drops of bright red blood and subsequent vomiting with no blood for the past few hours prior to arrival. Associated with diffuse abdominal pain. Review of notes show pt to be jaundiced with scleral icterus as her baseline. Currently in Stage 4 liver failure. While lab results are abnormal, they do result similarly to her baseline. Nothing stands out today as an acute or emergent change in her baseline. Patient's pain and other symptoms  adequately managed in emergency department.  Fluid bolus given.  Labs, imaging and vitals reviewed.  Patient does not meet the SIRS or Sepsis criteria.  On repeat exam patient does not have a surgical abdomen and there are no peritoneal signs.  No indication of appendicitis, bowel obstruction, bowel perforation, cholecystitis, diverticulitis, PID or ectopic pregnancy.  Discussed results with pt who states she is ready to go home. Patient discharged home with symptomatic treatment and given strict instructions for follow-up with their primary care physician.  I have also discussed reasons  to return immediately to the ER.  Patient expresses understanding and agrees with plan. Pt case discussed with Dr Silverio Lay who agrees with plan to discharge.     Glade Nurse, PA-C 12/20/12 2323

## 2012-12-20 NOTE — ED Notes (Addendum)
BIB self. C/o blood emesis (bright red) 2-3 hours ago. States Hx of cirrhosis. Patient also endorses seizure Hx and that she has had many seizures recently. Ambulated to triage with steady gait. No emesis at this time NO CP, SOB

## 2012-12-20 NOTE — ED Notes (Signed)
Patient requiring frequent reawakening

## 2012-12-22 ENCOUNTER — Inpatient Hospital Stay (HOSPITAL_COMMUNITY)
Admission: EM | Admit: 2012-12-22 | Discharge: 2012-12-26 | DRG: 392 | Disposition: A | Discharge status: Home / Self Care | Payer: Medicare Other | Attending: Family Medicine | Admitting: Family Medicine

## 2012-12-22 ENCOUNTER — Encounter (HOSPITAL_COMMUNITY): Payer: Self-pay | Admitting: Emergency Medicine

## 2012-12-22 DIAGNOSIS — D61818 Other pancytopenia: Secondary | ICD-10-CM | POA: Diagnosis present

## 2012-12-22 DIAGNOSIS — G8929 Other chronic pain: Secondary | ICD-10-CM

## 2012-12-22 DIAGNOSIS — F1410 Cocaine abuse, uncomplicated: Secondary | ICD-10-CM | POA: Diagnosis present

## 2012-12-22 DIAGNOSIS — K7460 Unspecified cirrhosis of liver: Secondary | ICD-10-CM

## 2012-12-22 DIAGNOSIS — E669 Obesity, unspecified: Secondary | ICD-10-CM | POA: Diagnosis present

## 2012-12-22 DIAGNOSIS — I8500 Esophageal varices without bleeding: Secondary | ICD-10-CM

## 2012-12-22 DIAGNOSIS — F10239 Alcohol dependence with withdrawal, unspecified: Secondary | ICD-10-CM | POA: Diagnosis present

## 2012-12-22 DIAGNOSIS — F1020 Alcohol dependence, uncomplicated: Secondary | ICD-10-CM

## 2012-12-22 DIAGNOSIS — R109 Unspecified abdominal pain: Secondary | ICD-10-CM

## 2012-12-22 DIAGNOSIS — F10939 Alcohol use, unspecified with withdrawal, unspecified: Secondary | ICD-10-CM | POA: Diagnosis present

## 2012-12-22 DIAGNOSIS — D649 Anemia, unspecified: Secondary | ICD-10-CM

## 2012-12-22 DIAGNOSIS — E871 Hypo-osmolality and hyponatremia: Secondary | ICD-10-CM

## 2012-12-22 DIAGNOSIS — K2920 Alcoholic gastritis without bleeding: Principal | ICD-10-CM | POA: Diagnosis present

## 2012-12-22 DIAGNOSIS — D696 Thrombocytopenia, unspecified: Secondary | ICD-10-CM | POA: Diagnosis present

## 2012-12-22 DIAGNOSIS — Z8719 Personal history of other diseases of the digestive system: Secondary | ICD-10-CM

## 2012-12-22 DIAGNOSIS — Z87891 Personal history of nicotine dependence: Secondary | ICD-10-CM

## 2012-12-22 DIAGNOSIS — Z79899 Other long term (current) drug therapy: Secondary | ICD-10-CM

## 2012-12-22 DIAGNOSIS — R112 Nausea with vomiting, unspecified: Secondary | ICD-10-CM

## 2012-12-22 DIAGNOSIS — K319 Disease of stomach and duodenum, unspecified: Secondary | ICD-10-CM | POA: Diagnosis present

## 2012-12-22 DIAGNOSIS — G40909 Epilepsy, unspecified, not intractable, without status epilepticus: Secondary | ICD-10-CM | POA: Diagnosis present

## 2012-12-22 DIAGNOSIS — K8309 Other cholangitis: Secondary | ICD-10-CM | POA: Diagnosis present

## 2012-12-22 DIAGNOSIS — I8510 Secondary esophageal varices without bleeding: Secondary | ICD-10-CM | POA: Diagnosis present

## 2012-12-22 DIAGNOSIS — F1010 Alcohol abuse, uncomplicated: Secondary | ICD-10-CM | POA: Diagnosis present

## 2012-12-22 DIAGNOSIS — K754 Autoimmune hepatitis: Secondary | ICD-10-CM

## 2012-12-22 DIAGNOSIS — R1011 Right upper quadrant pain: Secondary | ICD-10-CM

## 2012-12-22 LAB — URINALYSIS, ROUTINE W REFLEX MICROSCOPIC
Ketones, ur: NEGATIVE mg/dL
Nitrite: POSITIVE — AB
Protein, ur: NEGATIVE mg/dL
pH: 6 (ref 5.0–8.0)

## 2012-12-22 LAB — CBC WITH DIFFERENTIAL/PLATELET
Basophils Absolute: 0 10*3/uL (ref 0.0–0.1)
Basophils Relative: 0 % (ref 0–1)
Eosinophils Absolute: 0 10*3/uL (ref 0.0–0.7)
Eosinophils Relative: 1 % (ref 0–5)
HCT: 25 % — ABNORMAL LOW (ref 36.0–46.0)
Lymphocytes Relative: 11 % — ABNORMAL LOW (ref 12–46)
MCH: 37 pg — ABNORMAL HIGH (ref 26.0–34.0)
MCHC: 35.2 g/dL (ref 30.0–36.0)
MCV: 105 fL — ABNORMAL HIGH (ref 78.0–100.0)
Monocytes Absolute: 0.2 10*3/uL (ref 0.1–1.0)
Platelets: 45 10*3/uL — ABNORMAL LOW (ref 150–400)
RDW: 14.3 % (ref 11.5–15.5)

## 2012-12-22 LAB — URINE CULTURE: Colony Count: NO GROWTH

## 2012-12-22 LAB — URINE MICROSCOPIC-ADD ON

## 2012-12-22 LAB — ETHANOL: Alcohol, Ethyl (B): <11 mg/dL (ref 0–11)

## 2012-12-22 LAB — COMPREHENSIVE METABOLIC PANEL
AST: 138 U/L — ABNORMAL HIGH (ref 0–37)
CO2: 26 mEq/L (ref 19–32)
Calcium: 8.3 mg/dL — ABNORMAL LOW (ref 8.4–10.5)
Creatinine, Ser: 0.67 mg/dL (ref 0.50–1.10)
GFR calc non Af Amer: >90 mL/min (ref >90)
Sodium: 131 mEq/L — ABNORMAL LOW (ref 135–145)
Total Protein: 5.8 g/dL — ABNORMAL LOW (ref 6.0–8.3)

## 2012-12-22 LAB — POCT PREGNANCY, URINE: Preg Test, Ur: NEGATIVE

## 2012-12-22 LAB — PROTIME-INR: Prothrombin Time: 19.1 seconds — ABNORMAL HIGH (ref 11.6–15.2)

## 2012-12-22 MED ORDER — POTASSIUM CHLORIDE CRYS ER 20 MEQ PO TBCR
20.0000 meq | EXTENDED_RELEASE_TABLET | Freq: Two times a day (BID) | ORAL | Status: DC
  Administered 2012-12-23 – 2012-12-26 (×8): 20 meq via ORAL
  Filled 2012-12-22 (×10): qty 1

## 2012-12-22 MED ORDER — LORAZEPAM 1 MG PO TABS
1.0000 mg | ORAL_TABLET | Freq: Four times a day (QID) | ORAL | Status: AC | PRN
  Administered 2012-12-23: 1 mg via ORAL
  Filled 2012-12-22 (×2): qty 1

## 2012-12-22 MED ORDER — FUROSEMIDE 80 MG PO TABS
80.0000 mg | ORAL_TABLET | Freq: Two times a day (BID) | ORAL | Status: DC
  Administered 2012-12-23 – 2012-12-26 (×8): 80 mg via ORAL
  Filled 2012-12-22 (×10): qty 1

## 2012-12-22 MED ORDER — SUCRALFATE 1 GM/10ML PO SUSP: 1.0000 g | Freq: Two times a day (BID) | ORAL | Status: DC | PRN

## 2012-12-22 MED ORDER — POLYETHYLENE GLYCOL 3350 17 G PO PACK: 17.0000 g | PACK | Freq: Every day | ORAL | Status: DC | PRN

## 2012-12-22 MED ORDER — METOCLOPRAMIDE HCL 10 MG PO TABS
10.0000 mg | ORAL_TABLET | Freq: Three times a day (TID) | ORAL | Status: DC
  Administered 2012-12-23 – 2012-12-26 (×16): 10 mg via ORAL
  Filled 2012-12-22 (×19): qty 1

## 2012-12-22 MED ORDER — NADOLOL 20 MG PO TABS
20.0000 mg | ORAL_TABLET | Freq: Every day | ORAL | Status: DC
  Administered 2012-12-23: 20 mg via ORAL
  Administered 2012-12-24: via ORAL
  Administered 2012-12-25: 20 mg via ORAL
  Filled 2012-12-22 (×5): qty 1

## 2012-12-22 MED ORDER — LORAZEPAM 2 MG/ML IJ SOLN
1.0000 mg | Freq: Four times a day (QID) | INTRAMUSCULAR | Status: AC | PRN
  Filled 2012-12-22: qty 1

## 2012-12-22 MED ORDER — ONDANSETRON HCL 4 MG/2ML IJ SOLN
4.0000 mg | Freq: Once | INTRAMUSCULAR | Status: AC
  Administered 2012-12-22: 4 mg via INTRAVENOUS
  Filled 2012-12-22: qty 2

## 2012-12-22 MED ORDER — HYDROCODONE-ACETAMINOPHEN 5-325 MG PO TABS
1.0000 | ORAL_TABLET | Freq: Four times a day (QID) | ORAL | Status: DC | PRN
  Administered 2012-12-23 – 2012-12-24 (×6): 2 via ORAL
  Filled 2012-12-22 (×6): qty 2

## 2012-12-22 MED ORDER — PROMETHAZINE HCL 25 MG RE SUPP: 25.0000 mg | Freq: Four times a day (QID) | RECTAL | Status: DC | PRN

## 2012-12-22 MED ORDER — VITAMIN B-1 100 MG PO TABS
100.0000 mg | ORAL_TABLET | Freq: Every day | ORAL | Status: DC
Start: 2012-12-22 — End: 2012-12-26
  Administered 2012-12-22 – 2012-12-26 (×5): 100 mg via ORAL
  Filled 2012-12-22 (×6): qty 1

## 2012-12-22 MED ORDER — DIPHENHYDRAMINE HCL 50 MG/ML IJ SOLN
25.0000 mg | Freq: Once | INTRAMUSCULAR | Status: AC
  Administered 2012-12-22: 25 mg via INTRAVENOUS
  Filled 2012-12-22: qty 1

## 2012-12-22 MED ORDER — SODIUM CHLORIDE 0.9 % IV BOLUS (SEPSIS)
1000.0000 mL | Freq: Once | INTRAVENOUS | Status: AC
  Administered 2012-12-22: 1000 mL via INTRAVENOUS

## 2012-12-22 MED ORDER — LORAZEPAM 1 MG PO TABS
0.0000 mg | ORAL_TABLET | Freq: Two times a day (BID) | ORAL | Status: AC
  Administered 2012-12-25: 1 mg via ORAL
  Filled 2012-12-22: qty 1

## 2012-12-22 MED ORDER — MORPHINE SULFATE 4 MG/ML IJ SOLN
4.0000 mg | Freq: Once | INTRAMUSCULAR | Status: AC
  Administered 2012-12-22: 4 mg via INTRAVENOUS
  Filled 2012-12-22: qty 1

## 2012-12-22 MED ORDER — LACTULOSE 10 GM/15ML PO SOLN: 20.0000 g | Freq: Every day | ORAL | Status: DC | PRN

## 2012-12-22 MED ORDER — THIAMINE HCL 100 MG/ML IJ SOLN
Freq: Once | INTRAVENOUS | Status: AC
  Administered 2012-12-23: 02:00:00 via INTRAVENOUS
  Filled 2012-12-22: qty 1000

## 2012-12-22 MED ORDER — ADULT MULTIVITAMIN W/MINERALS CH
1.0000 | ORAL_TABLET | Freq: Every day | ORAL | Status: DC
  Administered 2012-12-22 – 2012-12-26 (×5): 1 via ORAL
  Filled 2012-12-22 (×6): qty 1

## 2012-12-22 MED ORDER — LORAZEPAM 1 MG PO TABS
0.0000 mg | ORAL_TABLET | Freq: Four times a day (QID) | ORAL | Status: AC
  Administered 2012-12-22 – 2012-12-23 (×3): 1 mg via ORAL
  Filled 2012-12-22 (×2): qty 1

## 2012-12-22 MED ORDER — SPIRONOLACTONE 100 MG PO TABS
100.0000 mg | ORAL_TABLET | Freq: Every day | ORAL | Status: DC
  Administered 2012-12-23 – 2012-12-25 (×3): 100 mg via ORAL
  Filled 2012-12-22 (×4): qty 1

## 2012-12-22 MED ORDER — ONDANSETRON 4 MG PO TBDP
4.0000 mg | ORAL_TABLET | Freq: Four times a day (QID) | ORAL | Status: DC | PRN
  Filled 2012-12-22: qty 1

## 2012-12-22 MED ORDER — LEVETIRACETAM 750 MG PO TABS
750.0000 mg | ORAL_TABLET | Freq: Two times a day (BID) | ORAL | Status: DC
  Administered 2012-12-23 – 2012-12-26 (×8): 750 mg via ORAL
  Filled 2012-12-22 (×9): qty 1

## 2012-12-22 MED ORDER — THIAMINE HCL 100 MG/ML IJ SOLN
100.0000 mg | Freq: Every day | INTRAMUSCULAR | Status: DC
  Filled 2012-12-22 (×2): qty 1

## 2012-12-22 MED ORDER — PANTOPRAZOLE SODIUM 40 MG PO TBEC
40.0000 mg | DELAYED_RELEASE_TABLET | Freq: Every day | ORAL | Status: DC
  Administered 2012-12-23 – 2012-12-26 (×4): 40 mg via ORAL
  Filled 2012-12-22 (×4): qty 1

## 2012-12-22 MED ORDER — SODIUM CHLORIDE 0.9 % IV SOLN
INTRAVENOUS | Status: AC
  Administered 2012-12-22: 18:00:00 via INTRAVENOUS

## 2012-12-22 MED ORDER — CEPHALEXIN 500 MG PO CAPS
500.0000 mg | ORAL_CAPSULE | Freq: Two times a day (BID) | ORAL | Status: DC
  Administered 2012-12-23 (×3): 500 mg via ORAL
  Filled 2012-12-22 (×5): qty 1

## 2012-12-22 MED ORDER — DIPHENHYDRAMINE HCL 50 MG/ML IJ SOLN
25.0000 mg | Freq: Four times a day (QID) | INTRAMUSCULAR | Status: DC | PRN
  Administered 2012-12-23 – 2012-12-25 (×5): 25 mg via INTRAVENOUS
  Filled 2012-12-22 (×7): qty 1

## 2012-12-22 MED ORDER — AZATHIOPRINE 50 MG PO TABS
50.0000 mg | ORAL_TABLET | Freq: Every day | ORAL | Status: DC
  Administered 2012-12-23: 50 mg via ORAL
  Filled 2012-12-22: qty 1

## 2012-12-22 MED ORDER — FOLIC ACID 1 MG PO TABS
1.0000 mg | ORAL_TABLET | Freq: Every day | ORAL | Status: DC
  Administered 2012-12-22 – 2012-12-26 (×5): 1 mg via ORAL
  Filled 2012-12-22 (×6): qty 1

## 2012-12-22 NOTE — ED Provider Notes (Signed)
Patient complains of epigastric pain and vomiting multiple times onset probably 5 days ago. Last bowel movement yesterday was normal. On exam patient is alert Glasgow Coma Score 15 HEENT exam sclera icteric. Mucous membranes dry neck supple lungs clear auscultation abdomen obese tender at epigastrium no guarding rigidity or rebound  Doug Sou, MD 12/22/12 1655

## 2012-12-22 NOTE — ED Notes (Signed)
Patient transported to [facility] by [facility].

## 2012-12-22 NOTE — ED Notes (Addendum)
Pt c/o nausea and vomiting x1 month.  Reports she has hx of cirrhosis of the liver. Was seen here last week stated "they just keep sending me home." Pt also reports drug and alcohol abuse and reports that she wants help.  Pt using cocaine last night, unaware of amount.  Denies drinking alcohol.  Pt was discharged from behavioral health last week. Pt reports being seen at [facility] yesterday.

## 2012-12-22 NOTE — ED Provider Notes (Signed)
I feel the patient requires inpatient stay on medical surgical floor for hydration and symptomatic and. And will require detox from alcohol. She also reports having used cocaine last time yesterday. Spoke with Dr.Merrrell with the patient in transfer at [facility].  Doug Sou, MD 12/22/12 1757

## 2012-12-22 NOTE — ED Provider Notes (Signed)
Medical screening examination/treatment/procedure(s) were performed by non-physician practitioner and as supervising physician I was immediately available for consultation/collaboration.   David H Yao, MD 12/22/12 1859 

## 2012-12-22 NOTE — ED Notes (Signed)
Patient awaiting [facility] to transport patient to [facility].

## 2012-12-22 NOTE — H&P (Signed)
Family Medicine Teaching [facility] Admission History and Physical  Patient name: [name] Medical record number: [id number] Date of birth: 07/28/1986 Age: 27 y.o. Gender: female  Primary Care Provider: Lloyd Huger, MD  Chief Complaint: Nausea, Vomiting, ETOH abuse/detox, cocaine abuse   Assessment and Plan: [name] is a 27 y.o. year old female presenting with PMH of primary sclerosis cholangitis, autoimmune hepatitis, non-alcoholic cirrhosis, seizure disorder, and thrombocytopenia who presents from [facility] with nausea, vomiting and recurrent abd pain, requesting ETOH detox and cocain abuse. At [facility], patient also found to have Blood ETOH of <11  # Nausea, vomiting, Abdominal pain: Likely secondary to chronic underlying GI disease and ETOH withdrawal. Improving after zofran and IVF. Previous Acute Abdominal Series unremarkable. Patient will need continued f/u with her GI physician, most likely in the outpatient setting. - Zofran PRN nausea  - IV fluids - NS @ 75 mL/hr  - Tolerating clear liquid diet so will ADAT - Continue home Norco for pain   # Alcohol/drug abuse - Blood Alcohol <11 on admission. Pt now 48 hrs from last ETOH. Likely to start showing more significant signs of withdrawal w/in next 24 hrs. Patient AO x 3 and w/o tremors. ABD complaints as above. VS stable and w/o CP as MI concern for continued cocaine use. PT w/ 3 visits to ED in past week requesting admission for detox/inpatient therapy for ETOH/drug abuse. While this pt has significant medical conditions, they are stable and pt will only worsen if ETOH continues. PT must receive additional help through inpt detox and counseling program. Pt has repeatedly failed outpt efforts. Pt already received Thiamine and Folate in ED - CIWA protocol  - SW consult for ETOH and Drug abuse  - [facility] consult. Pt desperately needs their help and is actively seeking assistance to quit. - IVF as below  # Primary sclerosis cholangitis,  autoimmune hepatitis, non-alcoholic cirrhosis: Numerous metabolic derangements that are baseline for pt, and are actually improved from previous admission. Pt MELD score of >20 carries a 18mo mortality of ~20%. Itching likely from elevated bilirubin levels - Will continue home Lasix, Azathioprine, Lactulose, Spironolactone, and Nadalol.  - benadryl for itching   # UTI: UA concerning for UTI but w/o dysuria.  - Keflex 500mg  BID (no dosing change for hepatic dysfunction) - f/u UCX  # Seizure disorder. Head CT negative for any current intracranial pathology on last admission.  Pt at increased risk of seizure during withdrawal period. May need to increase Keppra during admission if seizes.  - Continuing home Keppra (750 BID)  # Thrombocytopenia: Appears at baseline.  - Hold Heparin products as below 50 - Will continue to monitor closely   FEN/GI: Tolerating clears. - Regular diet - Banabag @ 75 mL/hr; Clear liquid diet  - Continue home KDUR - continue home Protonix - Mag, and phos level  in the am   # Anemia: Chronic condition for pt. Macrocytic. Likely anemia of chronic disease w/ folate defiiency. Vit B12 nml as noted on 09/20/12. VS stable. No indication for transfusion and no gross blood loss.  - Folate in fluids - CBC in am  Prophylaxis: SCD  Disposition: Pending clinical improvement   Code Status: Full code  History of Present Illness: [name] is a 27 y.o. year old female presenting for alcohol detox. [facility] after [facility] for ETOH withdrawal on 5/11. Presented to [facility] ED on 5/14 again for withdrawal but was sent home after pt stated she wanted to come in for detox  and alcohol cessation. Pt treated for n/v. N/V has gotten worse but is now relieve in hospital after receiving Zofran and IVF. Last alcoholic drink on 5/14. Cocaine (snorted) yesterday. Currently abusing every day in place of ETOH. Denies any other drug use. Also presenting w/ itching which is worse than her  baseline.   Review Of Systems: Per HPI with all other systems negative  Patient Active Problem List   Diagnosis Date Noted  . Toxic effect of ethanol 12/16/2012  . Abdominal pain, chronic, right upper quadrant 11/27/2012  . Anemia 10/02/2012  . Bilateral lower abdominal pain 07/23/2012  . Autoimmune hepatitis 06/22/2012  . Ascites 06/22/2012  . 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 Nausea and vomiting in adult 06/21/2011  . Hyponatremia 06/21/2011  . Edema leg 05/26/2011  . Seizure disorder 03/26/2011  . ENCEPHALOPATHY-HEPATIC 09/18/2009  . Portal hypertension 09/18/2009  . INTRACRANIAL HEMORRHAGE 12/05/2007  . OTHER SPECIFIED DISEASE OF HAIR&HAIR FOLLICLES 12/05/2007  . AMENORRHEA 06/29/2007  . THROMBOCYTOPENIA 10/06/2006  . TOBACCO DEPENDENCE 10/06/2006  . ESOPHAGEAL VARICES 10/06/2006  . HEPATIC CIRRHOSIS, NONALCOHOLIC 10/06/2006  . LIVER FAILURE 10/06/2006  . PAPANICOLAOU SMEAR, ABNORMAL 10/06/2006   Past Medical History: Past Medical History  Diagnosis Date  . Substance abuse April 2013    Cocaine plus THC usage - delisted from liver transplant list.   . Esophageal varices     distal esophageal grade 1 varices, portal gastropathy on EGDs in 01/2010 and 07/2012:  no banding or other intervention undertaken on EGDs from 2009 - 07/2012.    [name] Cirrhosis of liver   . Autoimmune hepatitis     Confirmed via liver biopsy 2002  . PSC (primary sclerosing cholangitis)   . Seizures     onset in 2009 associated with vent requiring resp failure.   . Thrombocytopenia     associated with cirrhosis.  followed by Dr Alcide Evener  . Intracranial hemorrhage 2009    in association with severe thrombocytopenia    Past Surgical History: Past Surgical History  Procedure Laterality Date  . Esophagogastroduodenoscopy  07/18/2012    Procedure: ESOPHAGOGASTRODUODENOSCOPY (EGD);  Surgeon: Louis Meckel, MD;  Location: [facility];  Service: Endoscopy;  Laterality: N/A;  . Gastric varices  banding  07/18/2012    Procedure: GASTRIC VARICES BANDING;  Surgeon: Louis Meckel, MD;  Location: [facility];  Service: Endoscopy;  Laterality: N/A;  . Ercp  01/2010    Sclerosing Cholangitis with no dominant stricures     Social History: History   Social History  . Marital Status: Single    Spouse Name: N/A    Number of Children: 0  . Years of Education: N/A   Occupational History  .     Social History Main Topics  . Smoking status: Former Smoker    Types: Cigarettes    Quit date: 08/09/2005  . Smokeless tobacco: Never Used  . Alcohol Use: No  . Drug Use: Yes    Special: Cocaine     Comment: Pt used Cocaine last week   . Sexually Active: Yes    Birth Control/ Protection: Condom   Other Topics Concern  . None   Social History Narrative   Lives with her grandmother.     Family History: Family History  Problem Relation Age of Onset  . Cancer Neg Hx     Allergies: Allergies  Allergen Reactions  . Ibuprofen Shortness Of Breath    Current Facility-Administered Medications  Medication Dose Route Frequency Provider Last Rate Last Dose  .  0.9 %  sodium chloride infusion   Intravenous STAT Pascal Lux [place], PA-C 125 mL/hr at 12/22/12 1821    . folic acid (FOLVITE) tablet 1 mg  1 mg Oral Daily Pascal Lux [place], PA-C   1 mg at 12/22/12 1544  . LORazepam (ATIVAN) tablet 1 mg  1 mg Oral Q6H PRN Pascal Lux [place], PA-C       Or  . LORazepam (ATIVAN) injection 1 mg  1 mg Intravenous Q6H PRN Pascal Lux [place], PA-C      . LORazepam (ATIVAN) tablet 0-4 mg  0-4 mg Oral Q6H Heather Anne Shutter, PA-C   1 mg at 12/22/12 2137   Followed by  . [START ON 12/24/2012] LORazepam (ATIVAN) tablet 0-4 mg  0-4 mg Oral Q12H Heather Van Wingen, PA-C      . multivitamin with minerals tablet 1 tablet  1 tablet Oral Daily Pascal Lux [place], PA-C   1 tablet at 12/22/12 1544  . thiamine (VITAMIN B-1) tablet 100 mg  100 mg Oral Daily Pascal Lux [place], PA-C   100 mg at 12/22/12 1543    Or  . thiamine (B-1) injection 100 mg  100 mg Intravenous Daily Magnus Sinning, PA-C         Physical Exam: Filed Vitals:   12/22/12 2024  BP: 119/65  Pulse: 87  Temp: 98 F (36.7 C)  Resp: 18   General: NAD, WNWD HEENT: EOMI, PERRL, scleral icterus Heart: III/VI systolic murmur, RRR Lungs: CTAB, nml efffort Abdomen: Hyperactive BS, mild diffuse tenderness R>L Extremities: extremities normal, atraumatic, no cyanosis. Trace LE edema Musculoskeletal: no joint tenderness, deformity or swelling Skin:no rashes, mild jaundice Neurology: CN 2-12 grossly intact. AOx3  Labs and Imaging: Results for orders placed during the hospital encounter of 12/22/12 (from the past 24 hour(s))  CBC WITH DIFFERENTIAL     Status: Abnormal   Collection Time    12/22/12  3:00 PM      Result Value Range   WBC 2.8 (*) 4.0 - 10.5 K/uL   RBC 2.38 (*) 3.87 - 5.11 MIL/uL   Hemoglobin 8.8 (*) 12.0 - 15.0 g/dL   HCT 40.9 (*) 81.1 - 91.4 %   MCV 105.0 (*) 78.0 - 100.0 fL   MCH 37.0 (*) 26.0 - 34.0 pg   MCHC 35.2  30.0 - 36.0 g/dL   RDW 78.2  95.6 - 21.3 %   Platelets 45 (*) 150 - 400 K/uL   Neutrophils Relative % 80 (*) 43 - 77 %   Neutro Abs 2.3  1.7 - 7.7 K/uL   Lymphocytes Relative 11 (*) 12 - 46 %   Lymphs Abs 0.3 (*) 0.7 - 4.0 K/uL   Monocytes Relative 7  3 - 12 %   Monocytes Absolute 0.2  0.1 - 1.0 K/uL   Eosinophils Relative 1  0 - 5 %   Eosinophils Absolute 0.0  0.0 - 0.7 K/uL   Basophils Relative 0  0 - 1 %   Basophils Absolute 0.0  0.0 - 0.1 K/uL  COMPREHENSIVE METABOLIC PANEL     Status: Abnormal   Collection Time    12/22/12  3:00 PM      Result Value Range   Sodium 131 (*) 135 - 145 mEq/L   Potassium 3.4 (*) 3.5 - 5.1 mEq/L   Chloride 97  96 - 112 mEq/L   CO2 26  19 - 32 mEq/L   Glucose, Bld 135 (*) 70 - 99 mg/dL   BUN 10  6 - 23 mg/dL   Creatinine, Ser 1.61  0.50 - 1.10 mg/dL   Calcium 8.3 (*) 8.4 - 10.5 mg/dL   Total Protein 5.8 (*) 6.0 - 8.3 g/dL   Albumin 1.8 (*) 3.5 -  5.2 g/dL   AST 096 (*) 0 - 37 U/L   ALT 99 (*) 0 - 35 U/L   Alkaline Phosphatase 716 (*) 39 - 117 U/L   Total Bilirubin 12.0 (*) 0.3 - 1.2 mg/dL   GFR calc non Af Amer >90  >90 mL/min   GFR calc Af Amer >90  >90 mL/min  LIPASE, BLOOD     Status: None   Collection Time    12/22/12  3:00 PM      Result Value Range   Lipase 19  11 - 59 U/L  ETHANOL     Status: None   Collection Time    12/22/12  3:00 PM      Result Value Range   Alcohol, Ethyl (B) <11  0 - 11 mg/dL  URINALYSIS, ROUTINE W REFLEX MICROSCOPIC     Status: Abnormal   Collection Time    12/22/12  3:43 PM      Result Value Range   Color, Urine ORANGE (*) YELLOW   APPearance CLOUDY (*) CLEAR   Specific Gravity, Urine 1.028  1.005 - 1.030   pH 6.0  5.0 - 8.0   Glucose, UA NEGATIVE  NEGATIVE mg/dL   Hgb urine dipstick MODERATE (*) NEGATIVE   Bilirubin Urine LARGE (*) NEGATIVE   Ketones, ur NEGATIVE  NEGATIVE mg/dL   Protein, ur NEGATIVE  NEGATIVE mg/dL   Urobilinogen, UA >0.4 (*) 0.0 - 1.0 mg/dL   Nitrite POSITIVE (*) NEGATIVE   Leukocytes, UA SMALL (*) NEGATIVE  URINE MICROSCOPIC-ADD ON     Status: Abnormal   Collection Time    12/22/12  3:43 PM      Result Value Range   Squamous Epithelial / LPF FEW (*) RARE   WBC, UA 3-6  <3 WBC/hpf   RBC / HPF 3-6  <3 RBC/hpf   Bacteria, UA FEW (*) RARE  POCT PREGNANCY, URINE     Status: None   Collection Time    12/22/12  3:51 PM      Result Value Range   Preg Test, Ur NEGATIVE  NEGATIVE  PROTIME-INR     Status: Abnormal   Collection Time    12/22/12  5:12 PM      Result Value Range   Prothrombin Time 19.1 (*) 11.6 - 15.2 seconds   INR 1.66 (*) 0.00 - 1.49     Signed: MERRELL, DAVID, M.D. Family Medicine Resident PGY-2 [phone] 12/22/2012 10:57 PM

## 2012-12-22 NOTE — ED Notes (Signed)
Report given to Sean with [facility]. 

## 2012-12-22 NOTE — ED Provider Notes (Signed)
History     CSN: [id number]  Arrival date & time 12/22/12  1359   First MD Initiated Contact with Patient 12/22/12 1404      Chief Complaint  Patient presents with  . Nausea  . Medical Clearance    (Consider location/radiation/quality/duration/timing/severity/associated sxs/prior treatment) HPI Comments: 27 y.o. female past medical history significant for seizures, autoimmune hepatitis, cirrhosis, primary sclerosing cholangitis presents today with diffuse abdominal pain.  Abdominal pain has been present for the past five days.  This is her third ED visit in the past week for the same.  She reports that the abdominal pain is similar to the pain that she has had in the past.  Pain also associated with nausea and several episodes of vomiting.  She reports that her vomiting has been present for the past five days.  She denies any blood in her emesis.  Last BM was yesterday.  No blood in her stool.  She took Reglan and Norco for her symptoms, but does not feel that her symptoms improved.  She denies diarrhea.  Denies fever or chills.  Denies urinary symptoms.  Patient is also stating the she would like help with Alcohol Detox. She reports that she had been drinking a pint a day.  Last drink was yesterday.  She also reports that she uses Cocaine.  Last Cocaine use was also yesterday.   She denies SI or HI.    The history is provided by the patient.    Past Medical History  Diagnosis Date  . Substance abuse April 2013    Cocaine plus THC usage - delisted from liver transplant list.   . Esophageal varices     distal esophageal grade 1 varices, portal gastropathy on EGDs in 01/2010 and 07/2012:  no banding or other intervention undertaken on EGDs from 2009 - 07/2012.    [name] Cirrhosis of liver   . Autoimmune hepatitis     Confirmed via liver biopsy 2002  . PSC (primary sclerosing cholangitis)   . Seizures     onset in 2009 associated with vent requiring resp failure.   . Thrombocytopenia      associated with cirrhosis.  followed by Dr Alcide Evener  . Intracranial hemorrhage 2009    in association with severe thrombocytopenia    Past Surgical History  Procedure Laterality Date  . Esophagogastroduodenoscopy  07/18/2012    Procedure: ESOPHAGOGASTRODUODENOSCOPY (EGD);  Surgeon: Louis Meckel, MD;  Location: [facility];  Service: Endoscopy;  Laterality: N/A;  . Gastric varices banding  07/18/2012    Procedure: GASTRIC VARICES BANDING;  Surgeon: Louis Meckel, MD;  Location: [facility];  Service: Endoscopy;  Laterality: N/A;  . Ercp  01/2010    Sclerosing Cholangitis with no dominant stricures     Family History  Problem Relation Age of Onset  . Cancer Neg Hx     History  Substance Use Topics  . Smoking status: Former Smoker    Types: Cigarettes    Quit date: 08/09/2005  . Smokeless tobacco: Never Used  . Alcohol Use: No    OB History   Grav Para Term Preterm Abortions TAB SAB Ect Mult Living                  Review of Systems  Constitutional: Negative for fever and chills.  Gastrointestinal: Positive for nausea, vomiting and abdominal pain.  Genitourinary: Negative for dysuria, urgency and frequency.  Psychiatric/Behavioral: Negative for suicidal ideas, hallucinations and confusion.  All other systems reviewed and  are negative.    Allergies  Ibuprofen  Home Medications   Current Outpatient Rx  Name  Route  Sig  Dispense  Refill  . azaTHIOprine (IMURAN) 50 MG tablet   Oral   Take 50 mg by mouth daily.          . furosemide (LASIX) 40 MG tablet   Oral   Take 80 mg by mouth 2 (two) times daily.          [name] HYDROcodone-acetaminophen (NORCO) 5-325 MG per tablet   Oral   Take 1-2 tablets by mouth every 6 (six) hours as needed for pain.   30 tablet   0   . hydrOXYzine (ATARAX/VISTARIL) 25 MG tablet   Oral   Take 1 tablet (25 mg total) by mouth every 6 (six) hours as needed for itching.   21 tablet   0   . Lactulose 20 GM/30ML SOLN   Oral    Take 30 mLs (20 g total) by mouth daily as needed. For constipation   500 mL   0   . levETIRAcetam (KEPPRA) 750 MG tablet   Oral   Take 750 mg by mouth every 12 (twelve) hours.         . metoCLOPramide (REGLAN) 10 MG tablet   Oral   Take 10 mg by mouth 4 (four) times daily.         . nadolol (CORGARD) 20 MG tablet   Oral   Take 20 mg by mouth at bedtime.          . ondansetron (ZOFRAN-ODT) 4 MG disintegrating tablet   Oral   Take 4 mg by mouth every 6 (six) hours as needed. For nausea         . pantoprazole (PROTONIX) 40 MG tablet   Oral   Take 40 mg by mouth daily.          . potassium chloride SA (K-DUR,KLOR-CON) 20 MEQ tablet   Oral   Take 20 mEq by mouth 2 (two) times daily.         . promethazine (PHENERGAN) 25 MG suppository   Rectal   Place 25 mg rectally every 6 (six) hours as needed. For nausea         . spironolactone (ALDACTONE) 100 MG tablet   Oral   Take 1 tablet (100 mg total) by mouth daily.   60 tablet   0   . sucralfate (CARAFATE) 1 GM/10ML suspension   Oral   Take 1 g by mouth 2 (two) times daily as needed (for stomach pain).         [name] EPINEPHrine (EPI-PEN) 0.3 mg/0.3 mL DEVI   Intramuscular   Inject 0.3 mg into the muscle once.           BP 129/65  Pulse 86  Temp(Src) 98.1 F (36.7 C) (Oral)  Resp 16  SpO2 100%  Physical Exam  Nursing note and vitals reviewed. Constitutional: She appears well-developed and well-nourished.  HENT:  Head: Normocephalic and atraumatic.  Mouth/Throat: Uvula is midline. Mucous membranes are dry.  Eyes: Scleral icterus is present.  Neck: Normal range of motion. Neck supple.  Cardiovascular: Normal rate, regular rhythm and normal heart sounds.   Pulmonary/Chest: Effort normal and breath sounds normal.  Abdominal: Soft. Normal appearance and bowel sounds are normal. She exhibits no distension and no mass. There is generalized tenderness. There is no rigidity, no rebound and no guarding.   Neurological: She is alert.  Skin: Skin  is warm and dry.  Psychiatric: She has a normal mood and affect.    ED Course  Procedures (including critical care time)  Labs Reviewed  CBC WITH DIFFERENTIAL  COMPREHENSIVE METABOLIC PANEL  LIPASE, BLOOD  URINE RAPID DRUG SCREEN (HOSP PERFORMED)  ETHANOL  URINALYSIS, ROUTINE W REFLEX MICROSCOPIC   No results found.   No diagnosis found.  5:10 PM Discussed with Family Medicine resident.  She reports that she will discuss the case with her upper level attending and call back.  5:55 PM Dr Ethelda Chick has discussed the case with Dr. Margot Ables with [facility] who has agreed to admit the patient at [facility].  MDM  Patient presents with a chief complaint of diffuse abdominal pain, nausea, and vomiting.  No rebound or guarding on exam.  She reports that the pain is the pain is the same abdominal pain that she has had in the past.  She has a history of Liver Cirrhosis, Primary sclerosing cholangitis, and Autoimmune Hepatitis.  Labs today are abnormal, but unchanged from her baseline.  She is also requesting detox from Alcohol.  Patient admitted to [facility] service for inpatient detox.        Pascal Lux [place], PA-C 12/22/12 [phone]

## 2012-12-23 DIAGNOSIS — K7460 Unspecified cirrhosis of liver: Secondary | ICD-10-CM

## 2012-12-23 DIAGNOSIS — Z8719 Personal history of other diseases of the digestive system: Secondary | ICD-10-CM

## 2012-12-23 DIAGNOSIS — E871 Hypo-osmolality and hyponatremia: Secondary | ICD-10-CM

## 2012-12-23 DIAGNOSIS — R109 Unspecified abdominal pain: Secondary | ICD-10-CM

## 2012-12-23 DIAGNOSIS — I8500 Esophageal varices without bleeding: Secondary | ICD-10-CM

## 2012-12-23 DIAGNOSIS — R1011 Right upper quadrant pain: Secondary | ICD-10-CM

## 2012-12-23 DIAGNOSIS — K754 Autoimmune hepatitis: Secondary | ICD-10-CM

## 2012-12-23 DIAGNOSIS — G8929 Other chronic pain: Secondary | ICD-10-CM

## 2012-12-23 DIAGNOSIS — F1020 Alcohol dependence, uncomplicated: Secondary | ICD-10-CM

## 2012-12-23 DIAGNOSIS — R112 Nausea with vomiting, unspecified: Secondary | ICD-10-CM

## 2012-12-23 DIAGNOSIS — D649 Anemia, unspecified: Secondary | ICD-10-CM

## 2012-12-23 LAB — PHOSPHORUS: Phosphorus: 3.4 mg/dL (ref 2.3–4.6)

## 2012-12-23 LAB — CBC
MCHC: 35.3 g/dL (ref 30.0–36.0)
Platelets: 33 10*3/uL — ABNORMAL LOW (ref 150–400)
RDW: 14.2 % (ref 11.5–15.5)
WBC: 2.3 10*3/uL — ABNORMAL LOW (ref 4.0–10.5)

## 2012-12-23 LAB — URINE CULTURE

## 2012-12-23 MED ORDER — SODIUM CHLORIDE 0.9 % IV SOLN: INTRAVENOUS | Status: DC

## 2012-12-23 MED ORDER — SODIUM CHLORIDE 0.9 % IV SOLN
INTRAVENOUS | Status: AC
  Administered 2012-12-23: 22:00:00 via INTRAVENOUS

## 2012-12-23 NOTE — Progress Notes (Signed)
Family Medicine Teaching Service Progress Note: [phone]  Subjective: Main complaint is itching. Some mild abdominal pain which seems baseline. Has eaten breakfast without nausea. Denies jitteriness, anxiety.   Objective: Vital signs in last 24 hours: Temp:  [98 F (36.7 C)-98.1 F (36.7 C)] 98.1 F (36.7 C) (05/17 0534) Pulse Rate:  [73-92] 73 (05/17 0534) Resp:  [16-18] 16 (05/17 0534) BP: (105-129)/(50-73) 105/50 mmHg (05/17 0534) SpO2:  [96 %-100 %] 96 % (05/17 0534) Weight:  [150 lb 12.7 oz (68.4 kg)] 150 lb 12.7 oz (68.4 kg) (05/16 2300) Weight change:  Last BM Date: 12/22/12  Intake/Output from previous day: 05/16 0701 - 05/17 0700 In: 1696.3 [P.O.:840; I.V.:856.3] Out: 1600 [Urine:1600] Intake/Output this shift:    General appearance: alert, cooperative and no distress Head: Normocephalic, without obvious abnormality, atraumatic Eyes: positive findings: sclera icteric Resp: clear to auscultation bilaterally Cardio: regular rate and rhythm, S1, S2 normal, no murmur, click, rub or gallop GI: soft, mild diffuse TTP. nondistended. BS present. Extremities: edema trace pedal bilaterally. Neurologic: Alert and oriented X 3, normal strength and tone. Normal coordination  Lab Results:  Recent Labs  12/22/12 1500 12/23/12 0505  WBC 2.8* 2.3*  HGB 8.8* 7.3*  HCT 25.0* 20.7*  PLT 45* 33*   BMET  Recent Labs  12/20/12 1641 12/22/12 1500  NA 132* 131*  K 4.0 3.4*  CL 97 97  CO2 28 26  GLUCOSE 119* 135*  BUN 11 10  CREATININE 0.86 0.67  CALCIUM 8.5 8.3*    Studies/Results: No results found.  Medications:  Scheduled: . azaTHIOprine  50 mg Oral Daily  . cephALEXin  500 mg Oral Q12H  . folic acid  1 mg Oral Daily  . furosemide  80 mg Oral BID  . levETIRAcetam  750 mg Oral Q12H  . LORazepam  0-4 mg Oral Q6H   Followed by  . [START ON 12/24/2012] LORazepam  0-4 mg Oral Q12H  . metoCLOPramide  10 mg Oral TID AC & HS  . multivitamin with minerals  1  tablet Oral Daily  . nadolol  20 mg Oral QHS  . pantoprazole  40 mg Oral Daily  . potassium chloride SA  20 mEq Oral BID  . spironolactone  100 mg Oral Daily  . thiamine  100 mg Oral Daily   Or  . thiamine  100 mg Intravenous Daily   Continuous:  JYN:[id number], HYDROcodone-acetaminophen, lactulose, LORazepam, LORazepam, ondansetron, polyethylene glycol, promethazine, sucralfate  Assessment/Plan: [name] is a 27 y.o. year old female presenting with PMH of primary sclerosis cholangitis, autoimmune hepatitis, non-alcoholic cirrhosis, seizure disorder, and thrombocytopenia who presents from [facility] with nausea, vomiting and recurrent abd pain, with recent cocaine abuse requesting ETOH detox.   # Nausea, vomiting, Abdominal pain: Likely secondary to chronic underlying GI disease and ETOH gastritis. Improving after zofran and IVF. Acute Abdominal Series unremarkable.  - Zofran PRN nausea, reglan at home. Phenergan PR prn. - IV fluids - NS @ 75 mL/hr-->will DC as tolerating normal diet currently. - Tolerating regular diet - Continue home Norco for pain   # Alcohol/drug abuse - Blood Alcohol <11 on admission. Pt now near 72 hrs from last ETOH 5/14).  PT w/ 3 visits to ED in past week requesting admission for detox/inpatient therapy for ETOH/drug abuse. Pt has repeatedly failed outpt efforts.  -CIWA scores 1 and 2 since admission. Continue monitoring. -Thiamine and Folate given in ED  - SW consult for ETOH and Drug abuse  - [facility] consulted. Pt  desperately needs their help and is actively seeking assistance to quit. She failed outpatient efforts at ETOH detox.  # Primary sclerosis cholangitis, autoimmune hepatitis, non-alcoholic cirrhosis: Metabolic derangements are at baseline for pt, and are actually improved from previous admission. Pt MELD score of >20 carries a 42mo mortality of ~20%. Itching likely from elevated bilirubin levels  - Will continue home Lasix, Azathioprine, Lactulose,  Spironolactone, and Nadalol.  - benadryl for itching   # UTI: UA concerning for UTI but w/o dysuria.  - Keflex 500mg  BID (no dosing change for hepatic dysfunction)  - f/u UCX   # Seizure disorder. Head CT negative for any current intracranial pathology on last admission. Pt at increased risk of seizure during withdrawal period.  -prn ativan for seizure.  - Continuing home Keppra (750 BID)   # Thrombocytopenia: Appears at baseline.  - Hold Heparin products as below 50  - Will continue to monitor closely   #Leukopenia. Baseline in past 3 years 2.3-5.0 range. -check differential -repeat CBC in am.  FEN/GI: Tolerating clears since admission.  - Regular diet  - Continue home KDUR  - continue home Protonix  - Mag, and phos level in the am   # Anemia: Chronic condition for pt. Macrocytic. Likely anemia of chronic disease w/ folate defiiency. Vit B12 nml as noted on 09/20/12. VS stable. No indication for transfusion and no gross blood loss.  - Folate in fluids  - CBC in am.   Prophylaxis: SCD  Disposition: Pending clinical improvement, Psych evaluation for inpatient detoxification. Code Status: Full code    LOS: 1 day   Abdallah Hern PGY-3 12/23/2012, 8:16 AM

## 2012-12-23 NOTE — H&P (Signed)
FMTS Attending Admission Note: Terri Don MD Personal pager:  [phone] FPTS Service Pager:  [phone]  I  have seen and examined this patient, reviewed their chart. I have discussed this patient with the resident. I agree with the resident's findings, assessment and care plan.  Additionally:  27 yo AAF well known to FPTS service with history of PSC, autoimmune hepatitis, seizure disorder, not on transplant list secondary to substance abuse history who presents for detox for EtOH and cocaine abuse.  Cocaine use intermittent, EtOH use quanitified as 1-1.5 bottles of vodka a day.  Also came with with N/V, no abdominal pain.  This AM, abdominal pain has resolved.  No further nausea/vomiting.    Exam: Gen:  AAF lying in bed, ill-appearing, NAD HEENT:  Icterus BL.  Jaundice noted.  PERRL.  MMM Heart:  RRR.  Holosystolic murmur noted Lungs:  Clear Abd:  Soft/nondistended/nontender.  Good bowel sounds throughout all four quadrants.  No masses noted.  Ext:  +1 edema BL  Imp/Plan: 1.  Detox: - awaiting input from psych.   - continue CIWA, social work currently - she understands this is limiting factor for liver transplant  2.  GI: - N/V have resolved with IV medications.  - Will continue diet as tolerated.  If N/V returns, will move to NPO vs clears - Cirrhosis/PSC are confounding factors for her care  3.  Pancytopenia: - new this admit - known azathioprine toxicity - Will hold for now and watch  - if cbc continues to drop, consider heme vs GI consult for further recommendations.    Tobey Grim, MD 12/23/2012 11:31 AM

## 2012-12-23 NOTE — Progress Notes (Addendum)
[name] is a 27 y.o. female [id number] [birth year]  Subjective/Objective: Patient was seen by psychiatry liaison on May 11, she was recommended Prozac 40 mg daily for her depression.  At that time patient does not meet criteria for inpatient substance use and she was recommended to do detox treatment and then referred to chemical dependency intensive outpatient program.  Patient still has tremors or shakes.  She does not want CD IOP to like to be referred inpatient rehabilitation to avoid relapse into her drinking.  Patient is concerned about her physical health especially cirrhosis.  Patient is still on detox treatment.  She is on CIWA and receiving lorazepam.  Mental status examination Patient appears to be in his stated age.  She is fairly groomed she maintained poor eye contact.  She described her mood as depressed and her affect is constricted.  She denies any active or passive suicidal thoughts or homicidal thoughts.  She denies any auditory or visual hallucination.  She still has tremors.  There were no paranoia or delusions obsession present at this time.  She has good insight into her judgment and impulse control.   Filed Vitals:   12/23/12 0534  BP: 105/50  Pulse: 73  Temp: 98.1 F (36.7 C)  Resp: 16    Lab Results:   BMET    Component Value Date/Time   NA 131* 12/22/2012 1500   NA 133* 08/07/2012 1422   K 3.4* 12/22/2012 1500   K 3.9 08/07/2012 1422   CL 97 12/22/2012 1500   CL 100 08/07/2012 1422   CO2 26 12/22/2012 1500   CO2 24 08/07/2012 1422   GLUCOSE 135* 12/22/2012 1500   GLUCOSE 224* 08/07/2012 1422   BUN 10 12/22/2012 1500   BUN 10.0 08/07/2012 1422   CREATININE 0.67 12/22/2012 1500   CREATININE 0.71 10/19/2012 1542   CREATININE 0.7 08/07/2012 1422   CALCIUM 8.3* 12/22/2012 1500   CALCIUM 7.9* 08/07/2012 1422   GFRNONAA >90 12/22/2012 1500   GFRAA >90 12/22/2012 1500    Medications:  Scheduled:  . azaTHIOprine  50 mg Oral Daily  . cephALEXin  500 mg  Oral Q12H  . folic acid  1 mg Oral Daily  . furosemide  80 mg Oral BID  . levETIRAcetam  750 mg Oral Q12H  . LORazepam  0-4 mg Oral Q6H   Followed by  . [START ON 12/24/2012] LORazepam  0-4 mg Oral Q12H  . metoCLOPramide  10 mg Oral TID AC & HS  . multivitamin with minerals  1 tablet Oral Daily  . nadolol  20 mg Oral QHS  . pantoprazole  40 mg Oral Daily  . potassium chloride SA  20 mEq Oral BID  . spironolactone  100 mg Oral Daily  . thiamine  100 mg Oral Daily   Or  . thiamine  100 mg Intravenous Daily   Patient Active Problem List   Diagnosis Date Noted  . Toxic effect of ethanol 12/16/2012  . Abdominal pain, chronic, right upper quadrant 11/27/2012  . Anemia 10/02/2012  . Bilateral lower abdominal pain 07/23/2012  . Autoimmune hepatitis 06/22/2012  . Ascites 06/22/2012  . 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 Nausea and vomiting in adult 06/21/2011  . Hyponatremia 06/21/2011  . Edema leg 05/26/2011  . Seizure disorder 03/26/2011  . ENCEPHALOPATHY-HEPATIC 09/18/2009  . Portal hypertension 09/18/2009  . INTRACRANIAL HEMORRHAGE 12/05/2007  . OTHER SPECIFIED DISEASE OF HAIR&HAIR FOLLICLES 12/05/2007  . AMENORRHEA 06/29/2007  . THROMBOCYTOPENIA 10/06/2006  . TOBACCO DEPENDENCE 10/06/2006  .  ESOPHAGEAL VARICES 10/06/2006  . HEPATIC CIRRHOSIS, NONALCOHOLIC 10/06/2006  . LIVER FAILURE 10/06/2006  . PAPANICOLAOU SMEAR, ABNORMAL 10/06/2006    PRN Meds diphenhydrAMINE, HYDROcodone-acetaminophen, lactulose, LORazepam, LORazepam, ondansetron, polyethylene glycol, promethazine, sucralfate  Assessment Axis I alcohol dependence Axis II deferred Axis III see medical history Axis IV moderate  Plan Once patient is medically cleared and complete detox treatment, content social worker to her for inpatient rehabilitation Center.  Patient like to go for 28 days inpatient rehabilitation.  Patient does not meet criteria for inpatient psychiatric treatment at this time.  Please call  psychiatric consultation liaison services if needed.

## 2012-12-23 NOTE — Progress Notes (Signed)
FMTS Attending Daily Note:  Renold Don MD  [phone] pager  Family Practice pager:  [phone] I have seen and examined this patient and have reviewed their chart. I have discussed this patient with the resident. I agree with the resident's findings, assessment and care plan.  Additionally:  See sparate admit note for details.   Tobey Grim, MD 12/23/2012 11:37 AM

## 2012-12-24 LAB — COMPREHENSIVE METABOLIC PANEL
ALT: 91 U/L — ABNORMAL HIGH (ref 0–35)
AST: 167 U/L — ABNORMAL HIGH (ref 0–37)
Albumin: 1.3 g/dL — ABNORMAL LOW (ref 3.5–5.2)
CO2: 22 mEq/L (ref 19–32)
Calcium: 7.6 mg/dL — ABNORMAL LOW (ref 8.4–10.5)
Chloride: 102 mEq/L (ref 96–112)
Creatinine, Ser: 0.8 mg/dL (ref 0.50–1.10)
GFR calc non Af Amer: >90 mL/min (ref >90)
Sodium: 135 mEq/L (ref 135–145)

## 2012-12-24 LAB — CBC WITH DIFFERENTIAL/PLATELET
Basophils Absolute: 0 10*3/uL (ref 0.0–0.1)
Basophils Relative: 0 % (ref 0–1)
Eosinophils Relative: 4 % (ref 0–5)
Lymphocytes Relative: 17 % (ref 12–46)
MCHC: 35.2 g/dL (ref 30.0–36.0)
Monocytes Absolute: 0.3 10*3/uL (ref 0.1–1.0)
Neutro Abs: 2 10*3/uL (ref 1.7–7.7)
Platelets: 36 10*3/uL — ABNORMAL LOW (ref 150–400)
RDW: 13.8 % (ref 11.5–15.5)
WBC: 2.9 10*3/uL — ABNORMAL LOW (ref 4.0–10.5)

## 2012-12-24 LAB — RETICULOCYTES
RBC.: 2.04 MIL/uL — ABNORMAL LOW (ref 3.87–5.11)
Retic Count, Absolute: 61.2 10*3/uL (ref 19.0–186.0)

## 2012-12-24 NOTE — Progress Notes (Signed)
FMTS Attending Daily Note:  Renold Don MD  [phone] pager  Family Practice pager:  [phone] I have seen and examined this patient and have reviewed their chart. I have discussed this patient with the resident. I agree with the resident's findings, assessment and care plan.  Additionally:  No real change from yesterday.  Awaiting transfer to inpatient alcohol/drug detox.   Following pancytopenia, no worsening today.  Agree with retic count, which is pending.   Tobey Grim, MD 12/24/2012 1:57 PM

## 2012-12-24 NOTE — Progress Notes (Signed)
Family Medicine Teaching Service Progress Note: [phone]  Subjective: Patient reports moderate headache this AM.  Denies itching, abdominal pain. Had nausea last night. No nausea today.  Denies jitteriness and anxiety.   Objective: Vital signs in last 24 hours: Temp:  [97.6 F (36.4 C)-99.4 F (37.4 C)] 98.8 F (37.1 C) (05/18 0643) Pulse Rate:  [85-91] 91 (05/18 0643) Resp:  [18] 18 (05/18 0643) BP: (92-105)/(42-49) 103/49 mmHg (05/18 0643) SpO2:  [98 %-100 %] 100 % (05/18 0643) Weight change:  Last BM Date: 12/22/12  Intake/Output from previous day: 05/17 0701 - 05/18 0700 In: 840 [P.O.:840] Out: 300 [Urine:300] Intake/Output this shift:   General appearance: alert, cooperative and no distress Head: Normocephalic, without obvious abnormality, atraumatic Eyes: positive findings: sclera icteric Resp: clear to auscultation bilaterally Cardio: regular rate and rhythm, S1, S2 normal GI: soft, mild diffuse TTP. nondistended. BS present. Extremities: no edema.  Neurologic: Alert and oriented X 3, normal strength and tone. Normal coordination  Lab Results:  Recent Labs  12/23/12 0505 12/24/12 0520  WBC 2.3* 2.9*  HGB 7.3* 7.6*  HCT 20.7* 21.6*  PLT 33* 36*   BMET  Recent Labs  12/22/12 1500  NA 131*  K 3.4*  CL 97  CO2 26  GLUCOSE 135*  BUN 10  CREATININE 0.67  CALCIUM 8.3*    Studies/Results: Urine culture 5/16: insufficient growth.   Medications:  Scheduled: . cephALEXin  500 mg Oral Q12H  . folic acid  1 mg Oral Daily  . furosemide  80 mg Oral BID  . levETIRAcetam  750 mg Oral Q12H  . LORazepam  0-4 mg Oral Q6H   Followed by  . LORazepam  0-4 mg Oral Q12H  . metoCLOPramide  10 mg Oral TID AC & HS  . multivitamin with minerals  1 tablet Oral Daily  . nadolol  20 mg Oral QHS  . pantoprazole  40 mg Oral Daily  . potassium chloride SA  20 mEq Oral BID  . spironolactone  100 mg Oral Daily  . thiamine  100 mg Oral Daily   Or  . thiamine  100 mg  Intravenous Daily   Continuous:  AVW:[id number], HYDROcodone-acetaminophen, lactulose, LORazepam, LORazepam, ondansetron, polyethylene glycol, promethazine, sucralfate  Assessment/Plan: [name] is a 27 y.o. year old female presenting with PMH of primary sclerosis cholangitis, autoimmune hepatitis, non-alcoholic cirrhosis, seizure disorder, and pancytopenia who presents from [facility] with nausea, vomiting and recurrent abd pain, with recent cocaine abuse requesting ETOH detox.   # Nausea, vomiting, abdominal pain: Likely secondary to chronic underlying GI disease and ETOH gastritis. Acute abdominal series unremarkable. A: Improving. Patient is pain free this AM.  P:  Scheduled reglan and protonix -prn zofran  -regular diet  - Continue home Norco prn pain.   # Alcohol/drug abuse - Blood Alcohol <11 on admission. Pt now near 72 hrs from last ETOH 5/14).  PT w/ 3 visits to ED in past week requesting admission for detox/inpatient therapy for ETOH/drug abuse. Pt has repeatedly failed outpt efforts.  A: No signs of withdrawal. CIWA scores 1 and 2 since admission. Patient required 1 mg of Ativan yesterday AM at 1000. P:   Continue monitoring and CIWA protocol. -continue daily folic acid and thiamine.  -Patient evaluated by psych and does not meet criteria for inapatient  psychiatric treatment. Patient amenable to in patient rehabilitation once medically stable.  - SW consult to assist with placement.   #Pancytopenia: new this admission. The patient has had thrombocytopenia and leukopenia  off and on for the past 4 year at least, but this is the first time she has significant anemia as well. No active bleeding. She has known grade 1 distal esophageal varices on EGD done in December, 2013. 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 This has been held this admission.  The concern is could there be another reason  why she is pancytopenic like a viral infection (HIV, viral hepatitis), active bleed,  A: no active bleeding or bruising. No signs of infection, urine culture negative and keflex discontinued. ANC 1900K/uL.  P:  Trend CBC off azathioprine.  No heparin.  F/u retic count Will discuss utility of peripheral smear or bone marrow aspiration with my attending.    # Primary sclerosis cholangitis, autoimmune hepatitis, non-alcoholic cirrhosis: Metabolic derangements are at baseline for pt, and are actually improved from previous admission. Pt MELD score of >20 carries a 31mo mortality of ~20%. Itching likely from elevated bilirubin levels  - Will continue home Lasix, Lactulose, Spironolactone, and Nadalol.  - benadryl for itching   # ? UTI: UA concerning for UTI but w/o dysuria. Urine culture negative. P: Keflex D/Cd.   # Seizure disorder. Head CT negative for any current intracranial pathology on last admission. Pt at increased risk of seizure during withdrawal period.  -prn ativan for seizure.  - Continuing home Keppra (750 BID)   FEN/GI: Tolerating clears since admission.  - Regular diet  - Continue home KDUR  - continue home Protonix  - Mag, and phos level in the am    Prophylaxis: SCD  Disposition: Pending clinical improvement, Psych evaluation for inpatient detoxification. Code Status: Full code    LOS: 2 days   Alondra Sahni PGY-3 12/24/2012, 11:53 AM

## 2012-12-25 LAB — CBC
HCT: 22.8 % — ABNORMAL LOW (ref 36.0–46.0)
Hemoglobin: 8.1 g/dL — ABNORMAL LOW (ref 12.0–15.0)
RBC: 2.16 MIL/uL — ABNORMAL LOW (ref 3.87–5.11)

## 2012-12-25 NOTE — Progress Notes (Signed)
Patient ID: [name], female   DOB: 1986/06/17, 27 y.o.   MRN: [id number] Family Medicine Teaching Service Progress Note: [phone]  Subjective: Doing ok this morning. Pain well controlled on medications. Was able to tolerate breakfast. Tired.   Objective: Vital signs in last 24 hours: Temp:  [97.6 F (36.4 C)-98.6 F (37 C)] 98.3 F (36.8 C) (05/19 0615) Pulse Rate:  [79-106] 79 (05/19 0615) Resp:  [18] 18 (05/19 0615) BP: (102-124)/(54-62) 102/56 mmHg (05/19 0615) SpO2:  [99 %-100 %] 100 % (05/19 0615) Weight change:  Last BM Date: 12/22/12  Intake/Output from previous day: 05/18 0701 - 05/19 0700 In: 840 [P.O.:840] Out: -  Intake/Output this shift:   General appearance:Tired. Sedated? Head: Normocephalic, without obvious abnormality, atraumatic Eyes: positive findings: sclera icteric Resp: clear to auscultation bilaterally Cardio: regular rate and rhythm, S1, S2 normal GI: soft, very mild diffuse TTP. nondistended. BS present. Extremities: Trace edema.  Neurologic: appears tired or sedated today. Oriented  Lab Results:  Recent Labs  12/24/12 0520 12/25/12 0605  WBC 2.9* 4.1  HGB 7.6* 8.1*  HCT 21.6* 22.8*  PLT 36* 38*   BMET  Recent Labs  12/22/12 1500 12/24/12 1012  NA 131* 135  K 3.4* 3.5  CL 97 102  CO2 26 22  GLUCOSE 135* 105*  BUN 10 8  CREATININE 0.67 0.80  CALCIUM 8.3* 7.6*    Studies/Results: Urine culture 5/16: insufficient growth.   Medications:  Scheduled: . folic acid  1 mg Oral Daily  . furosemide  80 mg Oral BID  . levETIRAcetam  750 mg Oral Q12H  . LORazepam  0-4 mg Oral Q12H  . metoCLOPramide  10 mg Oral TID AC & HS  . multivitamin with minerals  1 tablet Oral Daily  . nadolol  20 mg Oral QHS  . pantoprazole  40 mg Oral Daily  . potassium chloride SA  20 mEq Oral BID  . spironolactone  100 mg Oral Daily  . thiamine  100 mg Oral Daily   Continuous:  JYN:[id number], HYDROcodone-acetaminophen, lactulose, LORazepam,  LORazepam, ondansetron, polyethylene glycol, promethazine, sucralfate  Assessment/Plan: [name] is a 27 y.o. year old female presenting with PMH of primary sclerosis cholangitis, autoimmune hepatitis, non-alcoholic cirrhosis, seizure disorder, and pancytopenia who presents from [facility] with nausea, vomiting and recurrent abd pain, with recent cocaine abuse requesting ETOH detox.   Nausea, vomiting, abdominal pain: Likely secondary to chronic underlying GI disease and ETOH gastritis. Acute abdominal series unremarkable. - Improving. Patient is pain free this AM again - Scheduled reglan and protonix; prn zofran  - regular diet --> tolerating - Continue home Norco prn pain.   Alcohol/drug abuse - Blood Alcohol <11 on admission. Pt now near 72 hrs from last ETOH 5/14).  PT w/ 3 visits to ED in past week requesting admission for detox/inpatient therapy for ETOH/drug abuse. Pt has repeatedly failed outpt efforts.  -  No signs of withdrawal. CIWA scores 1 and 2 since admission. Patient did not require ativan over the last 24 hours - Continue monitoring and CIWA protocol. - continue daily folic acid and thiamine.  - Patient evaluated by psych and does not meet criteria for inapatient  psychiatric treatment. Patient amenable to in patient rehabilitation once medically stable.  - SW consult to assist with placement in ETOH detox placement  Pancytopenia: new this admission. The patient has had thrombocytopenia and leukopenia off and on for the past 4 year at least, but this is the first time she has significant  anemia as well. No active bleeding. She has known grade 1 distal esophageal varices on EGD done in December, 2013. 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 This has been held this admission.   - no active bleeding or bruising. No signs of infection, urine culture negative and keflex discontinued. ANC 1900K/uL.   - Trend CBC off azathioprine --> improving today; GI feels she is ok with this mediation off during her treatment outpatient for alcohol abuse. She can restart after therapy is over.  - No heparin.   Primary sclerosis cholangitis, autoimmune hepatitis, non-alcoholic cirrhosis: Metabolic derangements are at baseline for pt, and are actually improved from previous admission. Pt MELD score of >20 carries a 54mo mortality of ~20%. Itching likely from elevated bilirubin levels  - Will continue home Lasix, Lactulose, Spironolactone, and Nadalol.  - benadryl for itching   Seizure disorder. Head CT negative for any current intracranial pathology on last admission. Pt at increased risk of seizure during withdrawal period.  - prn ativan for seizure.  - Continuing home Keppra (750 BID)   FEN/GI: Tolerating clears since admission.  - Regular diet  - Continue home KDUR  - continue home Protonix    Prophylaxis: SCD  Disposition: Pending evaluation for inpatient detoxification and social work placement. Code Status: Full code    LOS: 3 days   Felix Pacini PGY-1 12/25/2012, 9:46 AM

## 2012-12-25 NOTE — Progress Notes (Signed)
FMTS Attending Daily Note:  Renold Don MD  [phone] pager  Family Practice pager:  [phone] I have seen and examined this patient and have reviewed their chart. I have discussed this patient with the resident. I agree with the resident's findings, assessment and care plan.  Additionally:  Awaiting social work consult for placement.  I have placed this order today -- unclear if they had been consulted before now?  Tobey Grim, MD 12/25/2012 4:07 PM

## 2012-12-26 ENCOUNTER — Other Ambulatory Visit: Payer: Self-pay | Admitting: Family Medicine

## 2012-12-26 ENCOUNTER — Ambulatory Visit: Payer: Medicare Other | Admitting: Gastroenterology

## 2012-12-26 DIAGNOSIS — D61818 Other pancytopenia: Secondary | ICD-10-CM | POA: Diagnosis not present

## 2012-12-26 DIAGNOSIS — D649 Anemia, unspecified: Secondary | ICD-10-CM

## 2012-12-26 DIAGNOSIS — R109 Unspecified abdominal pain: Secondary | ICD-10-CM | POA: Diagnosis present

## 2012-12-26 DIAGNOSIS — F1010 Alcohol abuse, uncomplicated: Secondary | ICD-10-CM | POA: Diagnosis present

## 2012-12-26 MED ORDER — THIAMINE HCL 100 MG PO TABS: 100.0000 mg | ORAL_TABLET | Freq: Every day | ORAL | Status: DC

## 2012-12-26 MED ORDER — FOLIC ACID 1 MG PO TABS: 1.0000 mg | ORAL_TABLET | Freq: Every day | ORAL | Status: DC

## 2012-12-26 MED ORDER — ADULT MULTIVITAMIN W/MINERALS CH: 1.0000 | ORAL_TABLET | Freq: Every day | ORAL | Status: DC

## 2012-12-26 MED ORDER — METOCLOPRAMIDE HCL 10 MG PO TABS: 10.0000 mg | ORAL_TABLET | Freq: Three times a day (TID) | ORAL | Status: DC

## 2012-12-26 NOTE — Clinical Social Work Psych Assess (Signed)
Clinical Social Work Department CLINICAL SOCIAL WORK PSYCHIATRY SERVICE LINE ASSESSMENT 12/26/2012  Patient:  [name]  Account:  [id number]  Admit Date:  12/22/2012  Clinical Social Worker:  Read Drivers  Date/Time:  12/26/2012 09:15 AM Referred by:  Physician  Date referred:  12/26/2012 Reason for Referral  Substance Abuse   Presenting Symptoms/Problems (In the person's/family's own words):   Pt states she is requesting detox from cocaine   Abuse/Neglect/Trauma History (check all that apply)  Denies history   Abuse/Neglect/Trauma Comments:   denies   Psychiatric History (check all that apply)  Outpatient treatment   Psychiatric medications:  Ativan was d/c   Current Mental Health Hospitalizations/Previous Mental Health History:   Pt reports having outpatient psychiatrist   Current provider:   pt couldn't recall   Place and Date:   n/a   Current Medications:   Scheduled Meds:      . folic acid  1 mg Oral Daily  . furosemide  80 mg Oral BID  . levETIRAcetam  750 mg Oral Q12H  . LORazepam  0-4 mg Oral Q12H  . metoCLOPramide  10 mg Oral TID AC & HS  . multivitamin with minerals  1 tablet Oral Daily  . nadolol  20 mg Oral QHS  . pantoprazole  40 mg Oral Daily  . potassium chloride SA  20 mEq Oral BID  . spironolactone  100 mg Oral Daily  . thiamine  100 mg Oral Daily        Continuous Infusions:      PRN Meds:.diphenhydrAMINE, HYDROcodone-acetaminophen, lactulose, ondansetron, polyethylene glycol, promethazine, sucralfate       Previous Impatient Admission/Date/Reason:   none reported or noted   Emotional Health / Current Symptoms    Suicide/Self Harm  None reported   Suicide attempt in the past:   none reported or noted in the chart   Other harmful behavior:   none reported or noted in the chart   Psychotic/Dissociative Symptoms  None reported   Other Psychotic/Dissociative Symptoms:   none reported or noted in chart     Attention/Behavioral Symptoms  Within Normal Limits   Other Attention / Behavioral Symptoms:   none reported or noted in chart    Cognitive Impairment  Orientation - Place  Orientation - Self  Orientation - Situation  Orientation - Time  Poor Judgement  Poor/Impaired Decision-Making   Other Cognitive Impairment:   none reported or noted on the chart    Mood and Adjustment  Flat    Stress, Anxiety, Trauma, Any Recent Loss/Stressor  None reported   Anxiety (frequency):   none reported or noted in chart   Phobia (specify):   none reported or noted in chart   Compulsive behavior (specify):   none reported or noted in chart   Obsessive behavior (specify):   none reported or noted in chart   Other:   none reported or noted in chart   Substance Abuse/Use  Current substance use  History of substance use  Substance abuse treatment needed   SBIRT completed (please refer for detailed history):  N  Self-reported substance use:   upon admission  UDS was cancelled   Urinary Drug Screen Completed:  N Alcohol level:   BAL 50 upon admission UDS was cancelled no results to report    Environmental/Housing/Living Arrangement  With Family Member   Who is in the home:   Silvano Bilis   Emergency contact:  [name] [phone]   Financial  Medicare  Medicaid  Patient's Strengths and Goals (patient's own words):   Pt is compliant with medical advice and recommendations. Pt has grandmother in home who is supportive of pt seeking tx.  Pt has insight into the seriousness of her medical condision and is seeking help with SA tx.   Clinical Social Worker's Interpretive Summary:   Psych CSW assessed pt at bedside.  Pt was alert and oriented x4.  Pt denies SI/HI. Pt denies AVHD.  Pt denies hx of MH tx.  Pt reports seeing a psychiatrist though she could not recall who.  Pt reports that she is a daily cocaine user.  Denies IV drug use.  Pt reports that she also drinks  daily.  The amounts vary depending on availability.  Pt reports that she is either constantly drunk or high on cocaine.  UDS was cancelled so no results to report/confirm.  Pt was given outpatient, and both long-term and short-term inpatient resources for SA/ETOH abuse.  Psych CSW reviewed the process of admission into these units with pt. Pt acknowledged understanding and was able to verbalize the d/c plan back to this psych CSW.  Pt was agreeable to f/u at home re: tx.  Psych CSW made MD, unit CSW and RN aware.   Disposition:  Outpatient referral made/needed  Vickii Penna, LCSWA [phone]  Clinical Social Work

## 2012-12-26 NOTE — Progress Notes (Signed)
Physician Discharge Summary   Patient ID:  [name]  MRN: [id number]  DOB: 01/22/1986 Age: 27 y.o.  Admit date: 12/22/2012  Discharge date: 12/26/2012  Admitting Physician: Jeffrey H Walden, MD  PCP: KONKOL,JILL, MD   Consultants: psych   Discharge Diagnosis:  Principal Problem:  Abdominal pain, other specified site  Active Problems:  THROMBOCYTOPENIA  HEPATIC CIRRHOSIS, NONALCOHOLIC  Seizure disorder  Nausea and vomiting in adult  Autoimmune hepatitis  Sclerosing cholangitis  Other pancytopenia  Alcohol abuse   Hospital Course  [name] is a 27 y.o. year old female presenting with PMH of primary sclerosis cholangitis, autoimmune hepatitis, non-alcoholic cirrhosis, seizure disorder, and pancytopenia who presents from [facility] with nausea, vomiting and recurrent abd pain, with recent cocaine abuse requesting ETOH detox.   Nausea, vomiting, abdominal pain: patient presented with worsening N/V/ In the ED she received zofran and IVF and her N/V was relieved. Acute abdomen series revealed gasless nonspecific small bowel and no free air. She was treated with reglan, protonix, and prn zofran. Additionally she was continued on her home dose of norco prn for abdominal pain. Her nausea and abdominal pain was well controlled while in the hospital and she was discharged with reglan, zofran, and continuation of her norco. She was tolerating regular diet at time of discharge.   Alcohol/drug abuse - Blood Alcohol <11 on admission. Last ETOH 5/14. PT w/ 3 visits to ED in past week requesting admission for detox/inpatient therapy for ETOH/drug abuse. Pt has repeatedly failed outpt efforts. Patient did not show any signs of withdrawal during admission and had CIWA scores from 0-3. She received 3 mg total of ativan and was monitored on CIWA. She was placed on thiamine and folate. Patient evaluated by psych and does not meet criteria for inpatient psychiatric treatment. SW consulted for help with placement  in inpatient rehab for alcohol use. No beds were available and the patient was given resources and contact information for rehab by the CSW and advised that it would be her responsibility to follow-up on this after discharge.   Pancytopenia: new this admission. The patient has had thrombocytopenia and leukopenia off and on for the past 4 years at least, but this is the first time she has significant anemia as well. No active bleeding. She has known grade 1 distal esophageal varices on EGD done in December, 2013. 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 This was held following dose on 5/17. CBC was trended off azathioprine and all cell lines improved prior to discharge. Our team spoke to [name] and they felt she was being off this medication while she attempts treatment for alcohol abuse. Advised that she could restart this medication up to a month after stopping it. She was scheduled for a f/u CBC one week after stopping this medication.   Primary sclerosis cholangitis, autoimmune hepatitis, non-alcoholic cirrhosis: patient with AST 222, ALT 140, alk phos 819, and Tbili 11 on admission. At time of discharge AST 167, ALT 91, Alk phos 551 and Tbili 8.4. These all appear to be at the patients baseline. Pt MELD score of >20 carries a 3mo mortality of ~20%. Itching likely from elevated bilirubin levels. Continued home Lasix, Lactulose, Spironolactone, and Nadalol. Benadryl for itching   Seizure disorder. Head CT negative for any current intracranial pathology on last admission. Pt at increased risk of seizure during withdrawal period. Prn ativan for seizure. Continued home Keppra (  750 BID)   Problem List  1. Nausea  2. Vomiting  3. Abd pain  4. Alcohol abuse  5. Alcohol withdrawal  6. Pancytopenia  7. Primary sclerosing cholangitis  8. Autoimmune hepatitis  9. Non-alcoholic cirrhosis  10.  Seizure disorder   Procedures/Imaging:  Dg Chest 1 View  11/26/2012 *RADIOLOGY REPORT* Clinical Data: Coffee-ground emesis. Esophageal varices. Cirrhosis. CHEST - 1 VIEW Comparison: 04/07/2011 Findings: Heart is upper limits normal in size. Lungs are clear. No effusions or acute bony abnormality. IMPRESSION: No acute cardiopulmonary disease. Original Report Authenticated By: Kevin Dover, M.D.   Labs   CBC   Recent Labs  Lab  12/23/12 0505  12/24/12 0520  12/25/12 0605   WBC  2.3*  2.9*  4.1   HGB  7.3*  7.6*  8.1*   HCT  20.7*  21.6*  22.8*   PLT  33*  36*  38*    BMET   Recent Labs  Lab  12/20/12 1641  12/22/12 1500  12/24/12 1012   NA  132*  131*  135   K  4.0  3.4*  3.5   CL  97  97  102   CO2  28  26  22   BUN  11  10  8   CREATININE  0.86  0.67  0.80   CALCIUM  8.5  8.3*  7.6*   PROT  6.3  5.8*  4.6*   BILITOT  11.0*  12.0*  8.4*   ALKPHOS  819*  716*  551*   ALT  140*  99*  91*   AST  222*  138*  167*   GLUCOSE  119*  135*  105*    Results for orders placed during the hospital encounter of 12/22/12 (from the past 72 hour(s))   CBC WITH DIFFERENTIAL Status: Abnormal    Collection Time    12/24/12 5:20 AM   Result  Value  Range    WBC  2.9 (*)  4.0 - 10.5 K/uL    RBC  2.05 (*)  3.87 - 5.11 MIL/uL    Hemoglobin  7.6 (*)  12.0 - 15.0 g/dL    HCT  21.6 (*)  36.0 - 46.0 %    MCV  105.4 (*)  78.0 - 100.0 fL    MCH  37.1 (*)  26.0 - 34.0 pg    MCHC  35.2  30.0 - 36.0 g/dL    RDW  13.8  11.5 - 15.5 %    Platelets  36 (*)  150 - 400 K/uL    Comment:  CONSISTENT WITH PREVIOUS RESULT    Neutrophils Relative %  68  43 - 77 %    Neutro Abs  2.0  1.7 - 7.7 K/uL    Lymphocytes Relative  17  12 - 46 %    Lymphs Abs  0.5 (*)  0.7 - 4.0 K/uL    Monocytes Relative  11  3 - 12 %    Monocytes Absolute  0.3  0.1 - 1.0 K/uL    Eosinophils Relative  4  0 - 5 %    Eosinophils Absolute  0.1  0.0 - 0.7 K/uL    Basophils Relative  0  0 - 1 %    Basophils Absolute  0.0  0.0 -  0.1 K/uL   RETICULOCYTES Status: Abnormal    Collection Time    12/24/12 5:20 AM   Result  Value  Range    Retic   Ct Pct  3.0  0.4 - 3.1 %    RBC.  2.04 (*)  3.87 - 5.11 MIL/uL    Retic Count, Manual  61.2  19.0 - 186.0 K/uL   COMPREHENSIVE METABOLIC PANEL Status: Abnormal    Collection Time    12/24/12 10:12 AM   Result  Value  Range    Sodium  135  135 - 145 mEq/L    Potassium  3.5  3.5 - 5.1 mEq/L    Chloride  102  96 - 112 mEq/L    CO2  22  19 - 32 mEq/L    Glucose, Bld  105 (*)  70 - 99 mg/dL    BUN  8  6 - 23 mg/dL    Creatinine, Ser  0.80  0.50 - 1.10 mg/dL    Calcium  7.6 (*)  8.4 - 10.5 mg/dL    Total Protein  4.6 (*)  6.0 - 8.3 g/dL    Albumin  1.3 (*)  3.5 - 5.2 g/dL    AST  167 (*)  0 - 37 U/L    ALT  91 (*)  0 - 35 U/L    Alkaline Phosphatase  551 (*)  39 - 117 U/L    Total Bilirubin  8.4 (*)  0.3 - 1.2 mg/dL    GFR calc non Af Amer  >90  >90 mL/min    GFR calc Af Amer  >90  >90 mL/min    Comment:      The eGFR has been calculated     using the CKD EPI equation.     This calculation has not been     validated in all clinical     situations.     eGFR's persistently     <90 mL/min signify     possible Chronic Kidney Disease.   CBC Status: Abnormal    Collection Time    12/25/12 6:05 AM   Result  Value  Range    WBC  4.1  4.0 - 10.5 K/uL    RBC  2.16 (*)  3.87 - 5.11 MIL/uL    Hemoglobin  8.1 (*)  12.0 - 15.0 g/dL    HCT  22.8 (*)  36.0 - 46.0 %    MCV  105.6 (*)  78.0 - 100.0 fL    MCH  37.5 (*)  26.0 - 34.0 pg    MCHC  35.5  30.0 - 36.0 g/dL    RDW  14.4  11.5 - 15.5 %    Platelets  38 (*)  150 - 400 K/uL    Comment:  CONSISTENT WITH PREVIOUS RESULT    Patient condition at time of discharge/disposition: stable  Disposition-home   Follow up issues: 1. F/u need to restart azathioprine-was d/c'd in setting of pancytopenia, GI stated would be ok to off this for up to a month as patient receives alcohol rehab  2. F/u alcohol use and rehab-patient given  resources for rehab and advised by social work that it was up to her to arrange for rehab  3. Will need f/u CBC to ensure pancyotopenia is improving   Discharge follow up:  Follow-up Information    Follow up On 12/29/2012. (lab appointment for CBC draw at 10:00)       Follow up with KONKOL,JILL, MD On 01/03/2013. (10:30 am)    Contact information:    [address]  [place] [address]  [phone]       Future   Appointments  Provider  Department  Dept Phone    12/26/2012 11:00 AM  Robert D Kaplan, MD  [facility]  [phone]    12/29/2012 10:00 AM  [facility]  [facility]  [phone]    01/03/2013 10:30 AM  Jill N Konkol, MD  [facility]  [phone]    02/08/2013 2:45 PM  Harold D Shumate  [facility]  [phone]    02/08/2013 3:15 PM  Lisa K Thomas, NP  [facility]  [phone]      Discharge Instructions: Please refer to Patient Instructions section of EMR for full details. Patient was counseled important signs and symptoms that should prompt return to medical care, changes in medications, dietary instructions, activity restrictions, and follow up appointments.   Discharge Medications    Medication List     STOP taking these medications       azaTHIOprine 50 MG tablet    Commonly known as: IMURAN     TAKE these medications       EPINEPHrine 0.3 mg/0.3 mL Devi    Commonly known as: EPI-PEN    Inject 0.3 mg into the muscle once.    folic acid 1 MG tablet    Commonly known as: FOLVITE    Take 1 tablet (1 mg total) by mouth daily.    furosemide 40 MG tablet    Commonly known as: LASIX    Take 80 mg by mouth 2 (two) times daily.    HYDROcodone-acetaminophen 5-325 MG per tablet    Commonly known as: NORCO    Take 1-2 tablets by mouth every 6 (six) hours as needed for pain.    hydrOXYzine 25 MG tablet    Commonly known as:  ATARAX/VISTARIL    Take 1 tablet (25 mg total) by mouth every 6 (six) hours as needed for itching.    Lactulose 20 GM/30ML Soln    Take 30 mLs (20 g total) by mouth daily as needed. For constipation    levETIRAcetam 750 MG tablet    Commonly known as: KEPPRA    Take 750 mg by mouth every 12 (twelve) hours.    metoCLOPramide 10 MG tablet    Commonly known as: REGLAN    Take 1 tablet (10 mg total) by mouth 4 (four) times daily - before meals and at bedtime.    metoCLOPramide 10 MG tablet    Commonly known as: REGLAN    Take 10 mg by mouth 4 (four) times daily.    multivitamin with minerals Tabs    Take 1 tablet by mouth daily.    nadolol 20 MG tablet    Commonly known as: CORGARD    Take 20 mg by mouth at bedtime.    ondansetron 4 MG disintegrating tablet    Commonly known as: ZOFRAN-ODT    Take 4 mg by mouth every 6 (six) hours as needed. For nausea    pantoprazole 40 MG tablet    Commonly known as: PROTONIX    Take 40 mg by mouth daily.    potassium chloride SA 20 MEQ tablet    Commonly known as: K-DUR,KLOR-CON    Take 20 mEq by mouth 2 (two) times daily.    promethazine 25 MG suppository    Commonly known as: PHENERGAN    Place 25 mg rectally every 6 (six) hours as needed. For nausea    spironolactone 100 MG tablet   

## 2012-12-26 NOTE — ED Provider Notes (Signed)
Medical screening examination/treatment/procedure(s) were performed by non-physician practitioner and as supervising physician I was immediately available for consultation/collaboration.  Toy Baker, MD 12/26/12 2255

## 2012-12-26 NOTE — Progress Notes (Signed)
Patient ID: [name], female   DOB: 06-28-1986, 27 y.o.   MRN: [id number] Family Medicine Teaching Service Progress Note: [phone]  Subjective: Continues to do well. Denies nausea, vomiting, and abd pain. Doing ok with regular diet. Denies tremor or palpitations.   Objective: Vital signs in last 24 hours: Temp:  [98 F (36.7 C)-99.5 F (37.5 C)] 98 F (36.7 C) (05/20 0607) Pulse Rate:  [84-90] 84 (05/20 0607) Resp:  [16-18] 18 (05/20 0607) BP: (102-113)/(46-56) 102/46 mmHg (05/20 0607) SpO2:  [100 %] 100 % (05/20 0607) Weight:  [161 lb 8 oz (73.256 kg)-162 lb (73.483 kg)] 162 lb (73.483 kg) (05/20 1478) Weight change:  Last BM Date: 12/22/12  Intake/Output from previous day: 05/19 0701 - 05/20 0700 In: 420 [P.O.:420] Out: -  Intake/Output this shift:   General appearance: NAD Head: Normocephalic, without obvious abnormality, atraumatic Resp: clear to auscultation bilaterally Cardio: regular rate and rhythm, S1, S2 normal GI: soft, very mild diffuse TTP. nondistended. BS present. Extremities: Trace edema.  Neurologic: alert, no focal deficits  Lab Results:  Recent Labs  12/24/12 0520 12/25/12 0605  WBC 2.9* 4.1  HGB 7.6* 8.1*  HCT 21.6* 22.8*  PLT 36* 38*   BMET  Recent Labs  12/24/12 1012  NA 135  K 3.5  CL 102  CO2 22  GLUCOSE 105*  BUN 8  CREATININE 0.80  CALCIUM 7.6*    Studies/Results: Urine culture 5/16: insufficient growth.   Medications:  Scheduled: . folic acid  1 mg Oral Daily  . furosemide  80 mg Oral BID  . levETIRAcetam  750 mg Oral Q12H  . LORazepam  0-4 mg Oral Q12H  . metoCLOPramide  10 mg Oral TID AC & HS  . multivitamin with minerals  1 tablet Oral Daily  . nadolol  20 mg Oral QHS  . pantoprazole  40 mg Oral Daily  . potassium chloride SA  20 mEq Oral BID  . spironolactone  100 mg Oral Daily  . thiamine  100 mg Oral Daily   Continuous:  GNF:AOZHYQMVHQIONGE, HYDROcodone-acetaminophen, lactulose, ondansetron,  polyethylene glycol, promethazine, sucralfate  Assessment/Plan: [name] is a 27 y.o. year old female presenting with PMH of primary sclerosis cholangitis, autoimmune hepatitis, non-alcoholic cirrhosis, seizure disorder, and pancytopenia who presents from [facility] with nausea, vomiting and recurrent abd pain, with recent cocaine abuse requesting ETOH detox.   Nausea, vomiting, abdominal pain: Likely secondary to chronic underlying GI disease and ETOH gastritis. Acute abdominal series unremarkable. - Improved. Patient is pain free this AM again - Continue scheduled reglan and protonix; prn zofran  - regular diet --> tolerating - Continue home Norco prn pain.   Alcohol/drug abuse - Blood Alcohol <11 on admission. Pt now near 72 hrs from last ETOH 5/14).  PT w/ 3 visits to ED in past week requesting admission for detox/inpatient therapy for ETOH/drug abuse. Pt has repeatedly failed outpt efforts.  - No signs of withdrawal. CIWA scores 0-3 since admission. Patient did not require ativan over the last 24 hours - Continue monitoring and CIWA protocol. - continue daily folic acid and thiamine.  - Patient evaluated by psych and does not meet criteria for inpatient  psychiatric treatment. Patient amenable to in patient rehabilitation once medically stable.  - SW consult to assist with placement in ETOH detox placement-will touch base regarding placement  Pancytopenia: new this admission. The patient has had thrombocytopenia and leukopenia off and on for the past 4 year at least, but this is the first time  she has significant anemia as well. No active bleeding. She has known grade 1 distal esophageal varices on EGD done in December, 2013. 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 This has been held this admission.   - no active bleeding or bruising. No signs of infection, urine culture negative and keflex discontinued.  ANC 1900K/uL.  - Trend CBC off azathioprine --> continues to improve; GI feels she is ok with this mediation off during her treatment outpatient for alcohol abuse. She can restart after therapy is over. Will need f/u CBC one week after stopping azathioprine. - No heparin.   Primary sclerosis cholangitis, autoimmune hepatitis, non-alcoholic cirrhosis: Metabolic derangements are at baseline for pt, and are actually improved from previous admission. Pt MELD score of >20 carries a 30mo mortality of ~20%. Itching likely from elevated bilirubin levels  - Will continue home Lasix, Lactulose, Spironolactone, and Nadalol.  - benadryl for itching   Seizure disorder. Head CT negative for any current intracranial pathology on last admission. Pt at increased risk of seizure during withdrawal period.  - prn ativan for seizure.  - Continuing home Keppra (750 BID)   FEN/GI: Tolerating clears since admission.  - Regular diet  - Continue home KDUR  - continue home Protonix    Prophylaxis: SCD  Disposition: Pending placement for inpatient alcohol rehab Code Status: Full code    LOS: 4 days   Marikay Alar PGY-1 12/26/2012, 7:57 AM

## 2012-12-26 NOTE — Clinical Social Work Psych Note (Signed)
Psych consult needed for detox/SA tx.  Detox window has expired.  Psych CSW contacted both [facility] and [facility].  Neither of whom have beds available today.  Pt was given SA/tx resources for inpatient short term, long term and outpatient.  Psych CSW reviewed these resources and the process for each with the pt.  Psych CSW informed pt that once medically stable she would be d/c and would have the responsibility to f/u with the SA tx resources on her own (i.e. Wait lists, bed statuses etc.)  Pt acknowledged understanding.  Psych CSW informed unit CSW, MD and RN.  Psych CSW signing off.  Vickii Penna, LCSWA [phone]  Clinical Social Work

## 2012-12-26 NOTE — Progress Notes (Signed)
FMTS Attending Daily Note:  Renold Don MD  [phone] pager  Family Practice pager:  [phone] I have seen and examined this patient and have reviewed their chart. I have discussed this patient with the resident. I agree with the resident's findings, assessment and care plan.  Additionally:  Patient does not have any available beds for inpatient rehabilitation.  Outpatient resources have been provided.  She is medically stable for DC, thus will DC home today.    Tobey Grim, MD 12/26/2012 3:29 PM

## 2012-12-27 NOTE — Telephone Encounter (Signed)
Patient no showed for her appointment yesterday.Terri KitchenMarland KitchenMarland Rojas

## 2012-12-29 ENCOUNTER — Other Ambulatory Visit: Payer: Medicare Other

## 2012-12-31 ENCOUNTER — Emergency Department (HOSPITAL_COMMUNITY)
Admission: EM | Admit: 2012-12-31 | Discharge: 2012-12-31 | Disposition: A | Discharge status: Home / Self Care | Payer: Medicare Other | Attending: Emergency Medicine | Admitting: Emergency Medicine

## 2012-12-31 ENCOUNTER — Encounter (HOSPITAL_COMMUNITY): Payer: Self-pay | Admitting: *Deleted

## 2012-12-31 ENCOUNTER — Emergency Department (HOSPITAL_COMMUNITY): Payer: Medicare Other

## 2012-12-31 DIAGNOSIS — Z87891 Personal history of nicotine dependence: Secondary | ICD-10-CM | POA: Insufficient documentation

## 2012-12-31 DIAGNOSIS — R10819 Abdominal tenderness, unspecified site: Secondary | ICD-10-CM | POA: Insufficient documentation

## 2012-12-31 DIAGNOSIS — Z9889 Other specified postprocedural states: Secondary | ICD-10-CM | POA: Insufficient documentation

## 2012-12-31 DIAGNOSIS — Z79899 Other long term (current) drug therapy: Secondary | ICD-10-CM | POA: Insufficient documentation

## 2012-12-31 DIAGNOSIS — Z8669 Personal history of other diseases of the nervous system and sense organs: Secondary | ICD-10-CM | POA: Insufficient documentation

## 2012-12-31 DIAGNOSIS — D696 Thrombocytopenia, unspecified: Secondary | ICD-10-CM | POA: Insufficient documentation

## 2012-12-31 DIAGNOSIS — R000 Tachycardia, unspecified: Secondary | ICD-10-CM | POA: Insufficient documentation

## 2012-12-31 DIAGNOSIS — Z8719 Personal history of other diseases of the digestive system: Secondary | ICD-10-CM | POA: Insufficient documentation

## 2012-12-31 DIAGNOSIS — R52 Pain, unspecified: Secondary | ICD-10-CM | POA: Insufficient documentation

## 2012-12-31 DIAGNOSIS — R109 Unspecified abdominal pain: Secondary | ICD-10-CM

## 2012-12-31 DIAGNOSIS — G40909 Epilepsy, unspecified, not intractable, without status epilepticus: Secondary | ICD-10-CM | POA: Insufficient documentation

## 2012-12-31 LAB — URINALYSIS, ROUTINE W REFLEX MICROSCOPIC
Glucose, UA: NEGATIVE mg/dL
Ketones, ur: NEGATIVE mg/dL
Protein, ur: 30 mg/dL — AB
Urobilinogen, UA: 1 mg/dL (ref 0.0–1.0)

## 2012-12-31 LAB — CBC WITH DIFFERENTIAL/PLATELET
Band Neutrophils: 0 % (ref 0–10)
Basophils Absolute: 0 10*3/uL (ref 0.0–0.1)
Basophils Relative: 0 % (ref 0–1)
Eosinophils Absolute: 0 10*3/uL (ref 0.0–0.7)
Eosinophils Relative: 1 % (ref 0–5)
HCT: 25.7 % — ABNORMAL LOW (ref 36.0–46.0)
Hemoglobin: 9 g/dL — ABNORMAL LOW (ref 12.0–15.0)
Lymphocytes Relative: 11 % — ABNORMAL LOW (ref 12–46)
Lymphs Abs: 0.4 10*3/uL — ABNORMAL LOW (ref 0.7–4.0)
MCHC: 35 g/dL (ref 30.0–36.0)
Monocytes Absolute: 0.3 10*3/uL (ref 0.1–1.0)
Neutro Abs: 2.8 10*3/uL (ref 1.7–7.7)
Neutrophils Relative %: 79 % — ABNORMAL HIGH (ref 43–77)
Promyelocytes Absolute: 0 %
RBC: 2.46 MIL/uL — ABNORMAL LOW (ref 3.87–5.11)

## 2012-12-31 LAB — COMPREHENSIVE METABOLIC PANEL
AST: 165 U/L — ABNORMAL HIGH (ref 0–37)
Albumin: 2 g/dL — ABNORMAL LOW (ref 3.5–5.2)
BUN: 9 mg/dL (ref 6–23)
Calcium: 8.8 mg/dL (ref 8.4–10.5)
Chloride: 101 mEq/L (ref 96–112)
Creatinine, Ser: 0.55 mg/dL (ref 0.50–1.10)
Total Protein: 6.4 g/dL (ref 6.0–8.3)

## 2012-12-31 LAB — URINE MICROSCOPIC-ADD ON

## 2012-12-31 LAB — ETHANOL: Alcohol, Ethyl (B): <11 mg/dL (ref 0–11)

## 2012-12-31 LAB — PROTIME-INR: Prothrombin Time: 17.6 seconds — ABNORMAL HIGH (ref 11.6–15.2)

## 2012-12-31 LAB — RAPID URINE DRUG SCREEN, HOSP PERFORMED
Amphetamines: NOT DETECTED
Opiates: NOT DETECTED
Tetrahydrocannabinol: NOT DETECTED

## 2012-12-31 LAB — APTT: aPTT: 41 seconds — ABNORMAL HIGH (ref 24–37)

## 2012-12-31 LAB — LIPASE, BLOOD: Lipase: 37 U/L (ref 11–59)

## 2012-12-31 MED ORDER — HYDROMORPHONE HCL PF 1 MG/ML IJ SOLN
1.0000 mg | Freq: Once | INTRAMUSCULAR | Status: AC
  Administered 2012-12-31: 1 mg via INTRAVENOUS
  Filled 2012-12-31: qty 1

## 2012-12-31 MED ORDER — LORAZEPAM 2 MG/ML IJ SOLN
1.0000 mg | Freq: Once | INTRAMUSCULAR | Status: AC
  Administered 2012-12-31: 1 mg via INTRAVENOUS
  Filled 2012-12-31: qty 1

## 2012-12-31 MED ORDER — SODIUM CHLORIDE 0.9 % IV BOLUS (SEPSIS)
1000.0000 mL | Freq: Once | INTRAVENOUS | Status: AC
  Administered 2012-12-31: 1000 mL via INTRAVENOUS

## 2012-12-31 MED ORDER — SODIUM CHLORIDE 0.9 % IV SOLN
INTRAVENOUS | Status: DC
  Administered 2012-12-31: 17:00:00 via INTRAVENOUS

## 2012-12-31 MED ORDER — ONDANSETRON HCL 4 MG/2ML IJ SOLN
4.0000 mg | Freq: Once | INTRAMUSCULAR | Status: AC
  Administered 2012-12-31: 4 mg via INTRAVENOUS
  Filled 2012-12-31: qty 2

## 2012-12-31 NOTE — ED Notes (Signed)
Patient transported to X-ray 

## 2012-12-31 NOTE — BH Assessment (Signed)
[facility] Assessment Progress Note    Dr. Freida Busman asked ACT to speak with pt.  Met with pt.  Pt was d/c from hospital on 12-26-12 for medical needs.  Pt relapsed consuming alcohol.  Last drink was 5-24.  Pt does not meet criteria for detox for 4 day binge based on reported consumption.    Provided pt info for rehab at [facility]  Also provided resources to [facility], [facility] and [facility].  Pt may follow up with resources as needed.  Pt did not report any SI HI or AVH related issues.  No assessment was conducted since referral info was all that was requested of ACT by MD.

## 2012-12-31 NOTE — ED Notes (Signed)
Pt c/o generalized body aches/pains since early this morning.  Pt states vomiting started this morning as well.  Pt also c/o SOB and chest pain.  Pt stated pain is worse with movement.

## 2012-12-31 NOTE — ED Provider Notes (Addendum)
History     CSN: [id number]  Arrival date & time 12/31/12  1306   First MD Initiated Contact with Patient 12/31/12 1503      Chief Complaint  Patient presents with  . Generalized Body Aches  . Emesis    (Consider location/radiation/quality/duration/timing/severity/associated sxs/prior treatment) Patient is a 27 y.o. female presenting with vomiting. The history is provided by the patient.  Emesis  patient here complaining of epigastric pain characterized as sharp and radiating to her chest. States symptoms have been since this morning associated with vomiting. No fever or chills. No black or bloody stools. Denies any anginal type pain. Symptoms have been gradually getting worse and nothing makes it worse. Denies any prior history of same. No treatment used prior to arrival. Denies any vaginal bleeding or discharge. Denies any hematuria or dysuria  Past Medical History  Diagnosis Date  . Substance abuse April 2013    Cocaine plus THC usage - delisted from liver transplant list.   . Esophageal varices     distal esophageal grade 1 varices, portal gastropathy on EGDs in 01/2010 and 07/2012:  no banding or other intervention undertaken on EGDs from 2009 - 07/2012.    [name] Cirrhosis of liver   . Autoimmune hepatitis     Confirmed via liver biopsy 2002  . PSC (primary sclerosing cholangitis)   . Seizures     onset in 2009 associated with vent requiring resp failure.   . Thrombocytopenia     associated with cirrhosis.  followed by Dr Alcide Evener  . Intracranial hemorrhage 2009    in association with severe thrombocytopenia    Past Surgical History  Procedure Laterality Date  . Esophagogastroduodenoscopy  07/18/2012    Procedure: ESOPHAGOGASTRODUODENOSCOPY (EGD);  Surgeon: Louis Meckel, MD;  Location: [facility];  Service: Endoscopy;  Laterality: N/A;  . Gastric varices banding  07/18/2012    Procedure: GASTRIC VARICES BANDING;  Surgeon: Louis Meckel, MD;  Location: [facility];   Service: Endoscopy;  Laterality: N/A;  . Ercp  01/2010    Sclerosing Cholangitis with no dominant stricures     Family History  Problem Relation Age of Onset  . Cancer Neg Hx     History  Substance Use Topics  . Smoking status: Former Smoker    Types: Cigarettes    Quit date: 08/09/2005  . Smokeless tobacco: Never Used  . Alcohol Use: No    OB History   Grav Para Term Preterm Abortions TAB SAB Ect Mult Living                  Review of Systems  Gastrointestinal: Positive for vomiting.  All other systems reviewed and are negative.    Allergies  Ibuprofen  Home Medications   Current Outpatient Rx  Name  Route  Sig  Dispense  Refill  . EPINEPHrine (EPI-PEN) 0.3 mg/0.3 mL DEVI   Intramuscular   Inject 0.3 mg into the muscle once.         . folic acid (FOLVITE) 1 MG tablet   Oral   Take 1 tablet (1 mg total) by mouth daily.   60 tablet   1   . furosemide (LASIX) 40 MG tablet   Oral   Take 80 mg by mouth 2 (two) times daily.          [name] HYDROcodone-acetaminophen (NORCO) 5-325 MG per tablet   Oral   Take 1-2 tablets by mouth every 6 (six) hours as needed for pain.  30 tablet   0   . hydrOXYzine (ATARAX/VISTARIL) 25 MG tablet   Oral   Take 1 tablet (25 mg total) by mouth every 6 (six) hours as needed for itching.   21 tablet   0   . Lactulose 20 GM/30ML SOLN   Oral   Take 30 mLs (20 g total) by mouth daily as needed. For constipation   500 mL   0   . levETIRAcetam (KEPPRA) 750 MG tablet   Oral   Take 750 mg by mouth every 12 (twelve) hours.         . metoCLOPramide (REGLAN) 10 MG tablet   Oral   Take 10 mg by mouth 4 (four) times daily.         . metoCLOPramide (REGLAN) 10 MG tablet   Oral   Take 1 tablet (10 mg total) by mouth 4 (four) times daily -  before meals and at bedtime.   60 tablet   0   . Multiple Vitamin (MULTIVITAMIN WITH MINERALS) TABS   Oral   Take 1 tablet by mouth daily.   120 tablet   3   . nadolol (CORGARD) 20  MG tablet   Oral   Take 20 mg by mouth at bedtime.          . ondansetron (ZOFRAN-ODT) 4 MG disintegrating tablet   Oral   Take 4 mg by mouth every 6 (six) hours as needed. For nausea         . pantoprazole (PROTONIX) 40 MG tablet   Oral   Take 40 mg by mouth daily.          . potassium chloride SA (K-DUR,KLOR-CON) 20 MEQ tablet   Oral   Take 20 mEq by mouth 2 (two) times daily.         . promethazine (PHENERGAN) 25 MG suppository   Rectal   Place 25 mg rectally every 6 (six) hours as needed. For nausea         . spironolactone (ALDACTONE) 100 MG tablet   Oral   Take 1 tablet (100 mg total) by mouth daily.   60 tablet   0   . sucralfate (CARAFATE) 1 GM/10ML suspension   Oral   Take 1 g by mouth 2 (two) times daily as needed (for stomach pain).         [name] thiamine 100 MG tablet   Oral   Take 1 tablet (100 mg total) by mouth daily.   120 tablet   3     BP 151/89  Pulse 115  Temp(Src) 98.5 F (36.9 C)  Resp 20  SpO2 97%  Physical Exam  Nursing note and vitals reviewed. Constitutional: She is oriented to person, place, and time. She appears well-developed and well-nourished.  Non-toxic appearance. No distress.  HENT:  Head: Normocephalic and atraumatic.  Eyes: Conjunctivae, EOM and lids are normal. Pupils are equal, round, and reactive to light. Scleral icterus is present.  Neck: Normal range of motion. Neck supple. No tracheal deviation present. No mass present.  Cardiovascular: Regular rhythm and normal heart sounds.  Tachycardia present.  Exam reveals no gallop.   No murmur heard. Pulmonary/Chest: Effort normal and breath sounds normal. No stridor. No respiratory distress. She has no decreased breath sounds. She has no wheezes. She has no rhonchi. She has no rales.  Abdominal: Soft. Normal appearance and bowel sounds are normal. She exhibits no distension. There is tenderness in the epigastric area. There is guarding. There  is no rebound and no CVA  tenderness.  Musculoskeletal: Normal range of motion. She exhibits no edema and no tenderness.  Neurological: She is alert and oriented to person, place, and time. She has normal strength. No cranial nerve deficit or sensory deficit. GCS eye subscore is 4. GCS verbal subscore is 5. GCS motor subscore is 6.  Skin: Skin is warm and dry. No abrasion and no rash noted.  Psychiatric: She has a normal mood and affect. Her speech is normal and behavior is normal.    ED Course  Procedures (including critical care time)  Labs Reviewed  CBC WITH DIFFERENTIAL  COMPREHENSIVE METABOLIC PANEL  LIPASE, BLOOD  APTT  PROTIME-INR  URINALYSIS, ROUTINE W REFLEX MICROSCOPIC   No results found.   No diagnosis found.    MDM  Pt given iv fluids and pain meds--uds positive for cocaine--labs reviewed and values are chronic--pt to f/u with fpc--feels better after tx  No evidence of surgical abdomen        Toy Baker, MD 12/31/12 1741  Toy Baker, MD 12/31/12 [phone]

## 2013-01-02 ENCOUNTER — Other Ambulatory Visit: Payer: Self-pay | Admitting: Family Medicine

## 2013-01-02 NOTE — Progress Notes (Signed)
Family Medicine Teaching Service  Discharge Note : Attending Jeff Eddrick Dilone MD Pager [phone] Inpatient Team Pager:  [phone]  I have reviewed this patient and the patient's chart and have discussed discharge planning with the resident at the time of discharge. I agree with the discharge plan as above.    

## 2013-01-03 ENCOUNTER — Inpatient Hospital Stay: Payer: Medicare Other | Admitting: Family Medicine

## 2013-01-03 NOTE — Discharge Summary (Signed)
Family Medicine Teaching Service  Discharge Note : Attending Jeff Lorren Rossetti MD Pager [phone] Inpatient Team Pager:  [phone]  I have reviewed this patient and the patient's chart and have discussed discharge planning with the resident at the time of discharge. I agree with the discharge plan as above.    

## 2013-01-03 NOTE — Discharge Summary (Signed)
Physician Discharge Summary   Patient ID:  [name]  MRN: [id number]  DOB: 1986-03-17 Age: 27 y.o.  Admit date: 12/22/2012  Discharge date: 12/26/2012  Admitting Physician: Tobey Grim, MD  PCP: Lloyd Huger, MD   Consultants: psych   Discharge Diagnosis:  Principal Problem:  Abdominal pain, other specified site  Active Problems:  THROMBOCYTOPENIA  HEPATIC CIRRHOSIS, NONALCOHOLIC  Seizure disorder  Nausea and vomiting in adult  Autoimmune hepatitis  Sclerosing cholangitis  Other pancytopenia  Alcohol abuse   Hospital Course  [name] is a 27 y.o. year old female presenting with PMH of primary sclerosis cholangitis, autoimmune hepatitis, non-alcoholic cirrhosis, seizure disorder, and pancytopenia who presents from [facility] with nausea, vomiting and recurrent abd pain, with recent cocaine abuse requesting ETOH detox.   Nausea, vomiting, abdominal pain: patient presented with worsening N/V/ In the ED she received zofran and IVF and her N/V was relieved. Acute abdomen series revealed gasless nonspecific small bowel and no free air. She was treated with reglan, protonix, and prn zofran. Additionally she was continued on her home dose of norco prn for abdominal pain. Her nausea and abdominal pain was well controlled while in the hospital and she was discharged with reglan, zofran, and continuation of her norco. She was tolerating regular diet at time of discharge.   Alcohol/drug abuse - Blood Alcohol <11 on admission. Last ETOH 5/14. PT w/ 3 visits to ED in past week requesting admission for detox/inpatient therapy for ETOH/drug abuse. Pt has repeatedly failed outpt efforts. Patient did not show any signs of withdrawal during admission and had CIWA scores from 0-3. She received 3 mg total of ativan and was monitored on CIWA. She was placed on thiamine and folate. Patient evaluated by psych and does not meet criteria for inpatient psychiatric treatment. SW consulted for help with placement  in inpatient rehab for alcohol use. No beds were available and the patient was given resources and contact information for rehab by the CSW and advised that it would be her responsibility to follow-up on this after discharge.   Pancytopenia: new this admission. The patient has had thrombocytopenia and leukopenia off and on for the past 4 years at least, but this is the first time she has significant anemia as well. No active bleeding. She has known grade 1 distal esophageal varices on EGD done in December, 2013. 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 This was held following dose on 5/17. CBC was trended off azathioprine and all cell lines improved prior to discharge. Our team spoke to [name] and they felt she was being off this medication while she attempts treatment for alcohol abuse. Advised that she could restart this medication up to a month after stopping it. She was scheduled for a f/u CBC one week after stopping this medication.   Primary sclerosis cholangitis, autoimmune hepatitis, non-alcoholic cirrhosis: patient with AST 222, ALT 140, alk phos 819, and Tbili 11 on admission. At time of discharge AST 167, ALT 91, Alk phos 551 and Tbili 8.4. These all appear to be at the patients baseline. Pt MELD score of >20 carries a 69mo mortality of ~20%. Itching likely from elevated bilirubin levels. Continued home Lasix, Lactulose, Spironolactone, and Nadalol. Benadryl for itching   Seizure disorder. Head CT negative for any current intracranial pathology on last admission. Pt at increased risk of seizure during withdrawal period. Prn ativan for seizure. Continued home Keppra (  750 BID)   Problem List  1. Nausea  2. Vomiting  3. Abd pain  4. Alcohol abuse  5. Alcohol withdrawal  6. Pancytopenia  7. Primary sclerosing cholangitis  8. Autoimmune hepatitis  9. Non-alcoholic cirrhosis  10.  Seizure disorder   Procedures/Imaging:  Dg Chest 1 View  11/26/2012 *RADIOLOGY REPORT* Clinical Data: Coffee-ground emesis. Esophageal varices. Cirrhosis. CHEST - 1 VIEW Comparison: 04/07/2011 Findings: Heart is upper limits normal in size. Lungs are clear. No effusions or acute bony abnormality. IMPRESSION: No acute cardiopulmonary disease. Original Report Authenticated By: Charlett Nose, M.D.   Labs   CBC   Recent Labs  Lab  12/23/12 0505  12/24/12 0520  12/25/12 0605   WBC  2.3*  2.9*  4.1   HGB  7.3*  7.6*  8.1*   HCT  20.7*  21.6*  22.8*   PLT  33*  36*  38*    BMET   Recent Labs  Lab  12/20/12 1641  12/22/12 1500  12/24/12 1012   NA  132*  131*  135   K  4.0  3.4*  3.5   CL  97  97  102   CO2  28  26  22    BUN  11  10  8    CREATININE  0.86  0.67  0.80   CALCIUM  8.5  8.3*  7.6*   PROT  6.3  5.8*  4.6*   BILITOT  11.0*  12.0*  8.4*   ALKPHOS  819*  716*  551*   ALT  140*  99*  91*   AST  222*  138*  167*   GLUCOSE  119*  135*  105*    Results for orders placed during the hospital encounter of 12/22/12 (from the past 72 hour(s))   CBC WITH DIFFERENTIAL Status: Abnormal    Collection Time    12/24/12 5:20 AM   Result  Value  Range    WBC  2.9 (*)  4.0 - 10.5 K/uL    RBC  2.05 (*)  3.87 - 5.11 MIL/uL    Hemoglobin  7.6 (*)  12.0 - 15.0 g/dL    HCT  16.1 (*)  09.6 - 46.0 %    MCV  105.4 (*)  78.0 - 100.0 fL    MCH  37.1 (*)  26.0 - 34.0 pg    MCHC  35.2  30.0 - 36.0 g/dL    RDW  04.5  40.9 - 81.1 %    Platelets  36 (*)  150 - 400 K/uL    Comment:  CONSISTENT WITH PREVIOUS RESULT    Neutrophils Relative %  68  43 - 77 %    Neutro Abs  2.0  1.7 - 7.7 K/uL    Lymphocytes Relative  17  12 - 46 %    Lymphs Abs  0.5 (*)  0.7 - 4.0 K/uL    Monocytes Relative  11  3 - 12 %    Monocytes Absolute  0.3  0.1 - 1.0 K/uL    Eosinophils Relative  4  0 - 5 %    Eosinophils Absolute  0.1  0.0 - 0.7 K/uL    Basophils Relative  0  0 - 1 %    Basophils Absolute  0.0  0.0 -  0.1 K/uL   RETICULOCYTES Status: Abnormal    Collection Time    12/24/12 5:20 AM   Result  Value  Range    Retic  Ct Pct  3.0  0.4 - 3.1 %    RBC.  2.04 (*)  3.87 - 5.11 MIL/uL    Retic Count, Manual  61.2  19.0 - 186.0 K/uL   COMPREHENSIVE METABOLIC PANEL Status: Abnormal    Collection Time    12/24/12 10:12 AM   Result  Value  Range    Sodium  135  135 - 145 mEq/L    Potassium  3.5  3.5 - 5.1 mEq/L    Chloride  102  96 - 112 mEq/L    CO2  22  19 - 32 mEq/L    Glucose, Bld  105 (*)  70 - 99 mg/dL    BUN  8  6 - 23 mg/dL    Creatinine, Ser  9.56  0.50 - 1.10 mg/dL    Calcium  7.6 (*)  8.4 - 10.5 mg/dL    Total Protein  4.6 (*)  6.0 - 8.3 g/dL    Albumin  1.3 (*)  3.5 - 5.2 g/dL    AST  387 (*)  0 - 37 U/L    ALT  91 (*)  0 - 35 U/L    Alkaline Phosphatase  551 (*)  39 - 117 U/L    Total Bilirubin  8.4 (*)  0.3 - 1.2 mg/dL    GFR calc non Af Amer  >90  >90 mL/min    GFR calc Af Amer  >90  >90 mL/min    Comment:      The eGFR has been calculated     using the CKD EPI equation.     This calculation has not been     validated in all clinical     situations.     eGFR's persistently     <90 mL/min signify     possible Chronic Kidney Disease.   CBC Status: Abnormal    Collection Time    12/25/12 6:05 AM   Result  Value  Range    WBC  4.1  4.0 - 10.5 K/uL    RBC  2.16 (*)  3.87 - 5.11 MIL/uL    Hemoglobin  8.1 (*)  12.0 - 15.0 g/dL    HCT  56.4 (*)  33.2 - 46.0 %    MCV  105.6 (*)  78.0 - 100.0 fL    MCH  37.5 (*)  26.0 - 34.0 pg    MCHC  35.5  30.0 - 36.0 g/dL    RDW  95.1  88.4 - 16.6 %    Platelets  38 (*)  150 - 400 K/uL    Comment:  CONSISTENT WITH PREVIOUS RESULT    Patient condition at time of discharge/disposition: stable  Disposition-home   Follow up issues: 1. F/u need to restart azathioprine-was d/c'd in setting of pancytopenia, GI stated would be ok to off this for up to a month as patient receives alcohol rehab  2. F/u alcohol use and rehab-patient given  resources for rehab and advised by social work that it was up to her to arrange for rehab  3. Will need f/u CBC to ensure pancyotopenia is improving   Discharge follow up:  Follow-up Information    Follow up On 12/29/2012. (lab appointment for CBC draw at 10:00)       Follow up with Lloyd Huger, MD On 01/03/2013. (10:30 am)    Contact information:    [address]  [place] [address]  [phone]       Future  Appointments  Provider  Department  Dept Phone    12/26/2012 11:00 AM  Louis Meckel, MD  [facility]  [phone]    12/29/2012 10:00 AM  [facility]  [facility]  [phone]    01/03/2013 10:30 AM  Durwin Reges, MD  [facility]  [phone]    02/08/2013 2:45 PM  Delcie Roch  [facility]  [phone]    02/08/2013 3:15 PM  Rana Snare, NP  [facility] MEDICAL ONCOL[facility]  [phone]      Discharge Instructions: Please refer to Patient Instructions section of EMR for full details. Patient was counseled important signs and symptoms that should prompt return to medical care, changes in medications, dietary instructions, activity restrictions, and follow up appointments.   Discharge Medications    Medication List     STOP taking these medications       azaTHIOprine 50 MG tablet    Commonly known as: IMURAN     TAKE these medications       EPINEPHrine 0.3 mg/0.3 mL Devi    Commonly known as: EPI-PEN    Inject 0.3 mg into the muscle once.    folic acid 1 MG tablet    Commonly known as: FOLVITE    Take 1 tablet (1 mg total) by mouth daily.    furosemide 40 MG tablet    Commonly known as: LASIX    Take 80 mg by mouth 2 (two) times daily.    HYDROcodone-acetaminophen 5-325 MG per tablet    Commonly known as: NORCO    Take 1-2 tablets by mouth every 6 (six) hours as needed for pain.    hydrOXYzine 25 MG tablet    Commonly known as:  ATARAX/VISTARIL    Take 1 tablet (25 mg total) by mouth every 6 (six) hours as needed for itching.    Lactulose 20 GM/30ML Soln    Take 30 mLs (20 g total) by mouth daily as needed. For constipation    levETIRAcetam 750 MG tablet    Commonly known as: KEPPRA    Take 750 mg by mouth every 12 (twelve) hours.    metoCLOPramide 10 MG tablet    Commonly known as: REGLAN    Take 1 tablet (10 mg total) by mouth 4 (four) times daily - before meals and at bedtime.    metoCLOPramide 10 MG tablet    Commonly known as: REGLAN    Take 10 mg by mouth 4 (four) times daily.    multivitamin with minerals Tabs    Take 1 tablet by mouth daily.    nadolol 20 MG tablet    Commonly known as: CORGARD    Take 20 mg by mouth at bedtime.    ondansetron 4 MG disintegrating tablet    Commonly known as: ZOFRAN-ODT    Take 4 mg by mouth every 6 (six) hours as needed. For nausea    pantoprazole 40 MG tablet    Commonly known as: PROTONIX    Take 40 mg by mouth daily.    potassium chloride SA 20 MEQ tablet    Commonly known as: K-DUR,KLOR-CON    Take 20 mEq by mouth 2 (two) times daily.    promethazine 25 MG suppository    Commonly known as: PHENERGAN    Place 25 mg rectally every 6 (six) hours as needed. For nausea    spironolactone 100 MG tablet  Commonly known as: ALDACTONE    Take 1 tablet (100 mg total) by mouth daily.    sucralfate 1 GM/10ML suspension    Commonly known as: CARAFATE    Take 1 g by mouth 2 (two) times daily as needed (for stomach pain).    thiamine 100 MG tablet    Take 1 tablet (100 mg total) by mouth daily.      Marikay Alar, MD of [facility]  12/28/2012 4:53 PM

## 2013-01-05 ENCOUNTER — Inpatient Hospital Stay: Payer: Medicare Other | Admitting: Family Medicine

## 2013-01-05 ENCOUNTER — Other Ambulatory Visit: Payer: Medicare Other

## 2013-01-10 ENCOUNTER — Other Ambulatory Visit: Payer: Medicare Other

## 2013-01-10 ENCOUNTER — Inpatient Hospital Stay: Payer: Medicare Other | Admitting: Family Medicine

## 2013-01-25 ENCOUNTER — Encounter (HOSPITAL_COMMUNITY): Payer: Self-pay | Admitting: Emergency Medicine

## 2013-01-25 ENCOUNTER — Emergency Department (HOSPITAL_COMMUNITY)
Admission: EM | Admit: 2013-01-25 | Discharge: 2013-01-25 | Disposition: A | Discharge status: Home / Self Care | Payer: Medicare Other | Attending: Emergency Medicine | Admitting: Emergency Medicine

## 2013-01-25 DIAGNOSIS — R197 Diarrhea, unspecified: Secondary | ICD-10-CM | POA: Insufficient documentation

## 2013-01-25 DIAGNOSIS — D696 Thrombocytopenia, unspecified: Secondary | ICD-10-CM | POA: Insufficient documentation

## 2013-01-25 DIAGNOSIS — G40909 Epilepsy, unspecified, not intractable, without status epilepticus: Secondary | ICD-10-CM | POA: Insufficient documentation

## 2013-01-25 DIAGNOSIS — Z79899 Other long term (current) drug therapy: Secondary | ICD-10-CM | POA: Insufficient documentation

## 2013-01-25 DIAGNOSIS — R112 Nausea with vomiting, unspecified: Secondary | ICD-10-CM | POA: Insufficient documentation

## 2013-01-25 DIAGNOSIS — K8309 Other cholangitis: Secondary | ICD-10-CM | POA: Insufficient documentation

## 2013-01-25 DIAGNOSIS — R109 Unspecified abdominal pain: Secondary | ICD-10-CM

## 2013-01-25 DIAGNOSIS — K7460 Unspecified cirrhosis of liver: Secondary | ICD-10-CM | POA: Insufficient documentation

## 2013-01-25 DIAGNOSIS — K754 Autoimmune hepatitis: Secondary | ICD-10-CM | POA: Insufficient documentation

## 2013-01-25 DIAGNOSIS — I8500 Esophageal varices without bleeding: Secondary | ICD-10-CM | POA: Insufficient documentation

## 2013-01-25 DIAGNOSIS — K7290 Hepatic failure, unspecified without coma: Secondary | ICD-10-CM

## 2013-01-25 DIAGNOSIS — F1910 Other psychoactive substance abuse, uncomplicated: Secondary | ICD-10-CM | POA: Insufficient documentation

## 2013-01-25 DIAGNOSIS — N390 Urinary tract infection, site not specified: Secondary | ICD-10-CM | POA: Insufficient documentation

## 2013-01-25 DIAGNOSIS — Z8679 Personal history of other diseases of the circulatory system: Secondary | ICD-10-CM | POA: Insufficient documentation

## 2013-01-25 DIAGNOSIS — Z87891 Personal history of nicotine dependence: Secondary | ICD-10-CM | POA: Insufficient documentation

## 2013-01-25 DIAGNOSIS — Z3202 Encounter for pregnancy test, result negative: Secondary | ICD-10-CM | POA: Insufficient documentation

## 2013-01-25 LAB — COMPREHENSIVE METABOLIC PANEL
AST: 256 U/L — ABNORMAL HIGH (ref 0–37)
Albumin: 1.9 g/dL — ABNORMAL LOW (ref 3.5–5.2)
BUN: 6 mg/dL (ref 6–23)
Calcium: 7.8 mg/dL — ABNORMAL LOW (ref 8.4–10.5)
Creatinine, Ser: 0.55 mg/dL (ref 0.50–1.10)
Total Protein: 6 g/dL (ref 6.0–8.3)

## 2013-01-25 LAB — CBC WITH DIFFERENTIAL/PLATELET
Basophils Absolute: 0 10*3/uL (ref 0.0–0.1)
Lymphs Abs: 0.4 10*3/uL — ABNORMAL LOW (ref 0.7–4.0)
MCH: 35.5 pg — ABNORMAL HIGH (ref 26.0–34.0)
MCV: 101.7 fL — ABNORMAL HIGH (ref 78.0–100.0)
Monocytes Absolute: 0.3 10*3/uL (ref 0.1–1.0)
Neutrophils Relative %: 81 % — ABNORMAL HIGH (ref 43–77)
Platelets: 23 10*3/uL — CL (ref 150–400)
RBC: 2.93 MIL/uL — ABNORMAL LOW (ref 3.87–5.11)
RDW: 13.1 % (ref 11.5–15.5)
WBC: 3.8 10*3/uL — ABNORMAL LOW (ref 4.0–10.5)

## 2013-01-25 LAB — PREGNANCY, URINE: Preg Test, Ur: NEGATIVE

## 2013-01-25 LAB — MAGNESIUM: Magnesium: 1.8 mg/dL (ref 1.5–2.5)

## 2013-01-25 LAB — URINALYSIS, ROUTINE W REFLEX MICROSCOPIC
Glucose, UA: NEGATIVE mg/dL
Ketones, ur: 15 mg/dL — AB
Ketones, ur: NEGATIVE mg/dL
Nitrite: POSITIVE — AB
Specific Gravity, Urine: 1.034 — ABNORMAL HIGH (ref 1.005–1.030)
pH: 7 (ref 5.0–8.0)
pH: 7.5 (ref 5.0–8.0)

## 2013-01-25 LAB — LIPASE, BLOOD: Lipase: 19 U/L (ref 11–59)

## 2013-01-25 LAB — URINE MICROSCOPIC-ADD ON

## 2013-01-25 MED ORDER — FAMOTIDINE IN NACL 20-0.9 MG/50ML-% IV SOLN
20.0000 mg | Freq: Once | INTRAVENOUS | Status: AC
  Administered 2013-01-25: 20 mg via INTRAVENOUS
  Filled 2013-01-25: qty 50

## 2013-01-25 MED ORDER — PROMETHAZINE HCL 25 MG/ML IJ SOLN
25.0000 mg | Freq: Once | INTRAMUSCULAR | Status: AC
  Administered 2013-01-25: 25 mg via INTRAVENOUS
  Filled 2013-01-25: qty 1

## 2013-01-25 MED ORDER — CEPHALEXIN 500 MG PO CAPS: 500.0000 mg | ORAL_CAPSULE | Freq: Two times a day (BID) | ORAL | Status: DC

## 2013-01-25 MED ORDER — HYDROMORPHONE HCL PF 1 MG/ML IJ SOLN
INTRAMUSCULAR | Status: AC
  Filled 2013-01-25: qty 1

## 2013-01-25 MED ORDER — HYDROMORPHONE HCL PF 1 MG/ML IJ SOLN
0.5000 mg | Freq: Once | INTRAMUSCULAR | Status: AC
  Administered 2013-01-25: 0.5 mg via INTRAVENOUS
  Filled 2013-01-25: qty 1

## 2013-01-25 MED ORDER — DEXTROSE 5 % IV SOLN
1.0000 g | Freq: Once | INTRAVENOUS | Status: AC
  Administered 2013-01-25: 1 g via INTRAVENOUS
  Filled 2013-01-25: qty 10

## 2013-01-25 MED ORDER — PROMETHAZINE HCL 25 MG PO TABS: 25.0000 mg | ORAL_TABLET | Freq: Four times a day (QID) | ORAL | Status: DC | PRN

## 2013-01-25 MED ORDER — ONDANSETRON HCL 4 MG/2ML IJ SOLN: 4.0000 mg | Freq: Once | INTRAMUSCULAR | Status: DC

## 2013-01-25 MED ORDER — SODIUM CHLORIDE 0.9 % IV SOLN
Freq: Once | INTRAVENOUS | Status: AC
  Administered 2013-01-25: 15:00:00 via INTRAVENOUS

## 2013-01-25 MED ORDER — SODIUM CHLORIDE 0.9 % IV BOLUS (SEPSIS)
1000.0000 mL | Freq: Once | INTRAVENOUS | Status: AC
  Administered 2013-01-25: 1000 mL via INTRAVENOUS

## 2013-01-25 MED ORDER — OXYCODONE-ACETAMINOPHEN 5-325 MG PO TABS: 1.0000 | ORAL_TABLET | ORAL | Status: DC | PRN

## 2013-01-25 MED ORDER — ONDANSETRON HCL 4 MG/2ML IJ SOLN
INTRAMUSCULAR | Status: AC
  Administered 2013-01-25: 4 mg
  Filled 2013-01-25: qty 2

## 2013-01-25 MED ORDER — HYDROMORPHONE HCL PF 1 MG/ML IJ SOLN
1.0000 mg | Freq: Once | INTRAMUSCULAR | Status: AC
  Administered 2013-01-25: 1 mg via INTRAVENOUS

## 2013-01-25 NOTE — ED Notes (Signed)
Pt here for c/o vomiting and diarrhea, started this am

## 2013-01-25 NOTE — ED Provider Notes (Signed)
History     CSN: [id number]  Arrival date & time 01/25/13  1230   First MD Initiated Contact with Patient 01/25/13 1250      Chief Complaint  Patient presents with  . Emesis  . Diarrhea    (Consider location/radiation/quality/duration/timing/severity/associated sxs/prior treatment) HPI Comments: Pt comes in with cc of abd pain, nausea, emesis. Pt has hx of PSC, Cirrhosis. States that she started feeling sick 2 days ago. Pt has abd pain - diffuse - from the midchest to the umbilicus, non radiating, intermittent, dull pain. There is associated nausea - with emesis x 10 times today. Pt also has had 2 loose BM today. Emesis is bilious, but non bloody. Pt denies any fevers. Admits to cocaine use - last use was 1 week ago, and admits to decreased alcohol intake.  Patient is a 27 y.o. female presenting with vomiting and diarrhea. The history is provided by the patient and medical records.  Emesis Associated symptoms: abdominal pain and diarrhea   Associated symptoms: no chills   Diarrhea Associated symptoms: abdominal pain and vomiting   Associated symptoms: no chills and no fever     Past Medical History  Diagnosis Date  . Substance abuse April 2013    Cocaine plus THC usage - delisted from liver transplant list.   . Esophageal varices     distal esophageal grade 1 varices, portal gastropathy on EGDs in 01/2010 and 07/2012:  no banding or other intervention undertaken on EGDs from 2009 - 07/2012.    [name] Cirrhosis of liver   . Autoimmune hepatitis     Confirmed via liver biopsy 2002  . PSC (primary sclerosing cholangitis)   . Seizures     onset in 2009 associated with vent requiring resp failure.   . Thrombocytopenia     associated with cirrhosis.  followed by Dr Alcide Evener  . Intracranial hemorrhage 2009    in association with severe thrombocytopenia    Past Surgical History  Procedure Laterality Date  . Esophagogastroduodenoscopy  07/18/2012    Procedure:  ESOPHAGOGASTRODUODENOSCOPY (EGD);  Surgeon: Louis Meckel, MD;  Location: [facility];  Service: Endoscopy;  Laterality: N/A;  . Gastric varices banding  07/18/2012    Procedure: GASTRIC VARICES BANDING;  Surgeon: Louis Meckel, MD;  Location: [facility];  Service: Endoscopy;  Laterality: N/A;  . Ercp  01/2010    Sclerosing Cholangitis with no dominant stricures     Family History  Problem Relation Age of Onset  . Cancer Neg Hx     History  Substance Use Topics  . Smoking status: Former Smoker    Types: Cigarettes    Quit date: 08/09/2005  . Smokeless tobacco: Never Used  . Alcohol Use: No    OB History   Grav Para Term Preterm Abortions TAB SAB Ect Mult Living                  Review of Systems  Constitutional: Negative for fever, chills and activity change.  HENT: Negative for facial swelling and neck pain.   Respiratory: Negative for cough, shortness of breath and wheezing.   Cardiovascular: Negative for chest pain.  Gastrointestinal: Positive for nausea, vomiting, abdominal pain and diarrhea. Negative for constipation, blood in stool and abdominal distention.  Genitourinary: Negative for hematuria and difficulty urinating.  Skin: Negative for color change.  Neurological: Negative for speech difficulty.  Hematological: Does not bruise/bleed easily.  Psychiatric/Behavioral: Negative for confusion.    Allergies  Ibuprofen  Home Medications  Current Outpatient Rx  Name  Route  Sig  Dispense  Refill  . EPINEPHrine (EPI-PEN) 0.3 mg/0.3 mL DEVI   Intramuscular   Inject 0.3 mg into the muscle once.         . folic acid (FOLVITE) 1 MG tablet   Oral   Take 1 tablet (1 mg total) by mouth daily.   60 tablet   1   . furosemide (LASIX) 40 MG tablet   Oral   Take 80 mg by mouth 2 (two) times daily.          [name] HYDROcodone-acetaminophen (NORCO) 5-325 MG per tablet   Oral   Take 1-2 tablets by mouth every 6 (six) hours as needed for pain.   30 tablet    0   . hydrOXYzine (ATARAX/VISTARIL) 25 MG tablet   Oral   Take 1 tablet (25 mg total) by mouth every 6 (six) hours as needed for itching.   21 tablet   0   . Lactulose 20 GM/30ML SOLN   Oral   Take 30 mLs (20 g total) by mouth daily as needed. For constipation   500 mL   0   . levETIRAcetam (KEPPRA) 750 MG tablet   Oral   Take 750 mg by mouth every 12 (twelve) hours.         . metoCLOPramide (REGLAN) 10 MG tablet   Oral   Take 10 mg by mouth 4 (four) times daily as needed (nausea).          . Multiple Vitamin (MULTIVITAMIN WITH MINERALS) TABS   Oral   Take 1 tablet by mouth daily.   120 tablet   3   . nadolol (CORGARD) 20 MG tablet   Oral   Take 20 mg by mouth at bedtime.          . ondansetron (ZOFRAN-ODT) 4 MG disintegrating tablet   Oral   Take 4 mg by mouth every 6 (six) hours as needed. For nausea         . pantoprazole (PROTONIX) 40 MG tablet   Oral   Take 40 mg by mouth daily.          . potassium chloride SA (K-DUR,KLOR-CON) 20 MEQ tablet   Oral   Take 20 mEq by mouth 2 (two) times daily.         . promethazine (PHENERGAN) 25 MG suppository   Rectal   Place 25 mg rectally every 6 (six) hours as needed. For nausea         . spironolactone (ALDACTONE) 100 MG tablet      take 1 tablet by mouth once daily   60 tablet   0   . sucralfate (CARAFATE) 1 GM/10ML suspension   Oral   Take 1 g by mouth 2 (two) times daily as needed (for stomach pain).         [name] thiamine 100 MG tablet   Oral   Take 1 tablet (100 mg total) by mouth daily.   120 tablet   3     BP 153/70  Pulse 100  Temp(Src) 99 F (37.2 C) (Oral)  Resp 20  SpO2 100%  Physical Exam  Nursing note and vitals reviewed. Constitutional: She is oriented to person, place, and time. She appears well-developed and well-nourished.  HENT:  Head: Normocephalic and atraumatic.  Eyes: EOM are normal. Pupils are equal, round, and reactive to light. Scleral icterus is present.   Neck: Neck supple. No JVD present.  Cardiovascular: Normal rate, regular rhythm and normal heart sounds.   No murmur heard. Pulmonary/Chest: Effort normal. No respiratory distress.  Abdominal: Soft. She exhibits no distension. There is no tenderness. There is no rebound and no guarding.  Mild discomfort only no cva tenderness  Neurological: She is alert and oriented to person, place, and time.  Skin: Skin is warm and dry.    ED Course  Procedures (including critical care time)  Labs Reviewed  CBC WITH DIFFERENTIAL - Abnormal; Notable for the following:    WBC 3.8 (*)    RBC 2.93 (*)    Hemoglobin 10.4 (*)    HCT 29.8 (*)    MCV 101.7 (*)    MCH 35.5 (*)    Platelets 23 (*)    Neutrophils Relative % 81 (*)    Lymphocytes Relative 11 (*)    Lymphs Abs 0.4 (*)    All other components within normal limits  COMPREHENSIVE METABOLIC PANEL - Abnormal; Notable for the following:    Sodium 130 (*)    Potassium 3.0 (*)    Glucose, Bld 113 (*)    Calcium 7.8 (*)    Albumin 1.9 (*)    AST 256 (*)    ALT 107 (*)    Alkaline Phosphatase 910 (*)    Total Bilirubin 10.6 (*)    All other components within normal limits  URINALYSIS, ROUTINE W REFLEX MICROSCOPIC - Abnormal; Notable for the following:    Color, Urine AMBER (*)    APPearance CLOUDY (*)    Specific Gravity, Urine 1.034 (*)    Hgb urine dipstick MODERATE (*)    Bilirubin Urine LARGE (*)    Ketones, ur 15 (*)    Protein, ur 100 (*)    Urobilinogen, UA 2.0 (*)    Nitrite POSITIVE (*)    Leukocytes, UA SMALL (*)    All other components within normal limits  URINE MICROSCOPIC-ADD ON - Abnormal; Notable for the following:    Squamous Epithelial / LPF MANY (*)    Bacteria, UA FEW (*)    All other components within normal limits  URINALYSIS, ROUTINE W REFLEX MICROSCOPIC - Abnormal; Notable for the following:    Color, Urine ORANGE (*)    APPearance TURBID (*)    Hgb urine dipstick MODERATE (*)    Bilirubin Urine LARGE (*)     Protein, ur 100 (*)    Nitrite POSITIVE (*)    Leukocytes, UA SMALL (*)    All other components within normal limits  URINE MICROSCOPIC-ADD ON - Abnormal; Notable for the following:    Bacteria, UA FEW (*)    Casts HYALINE CASTS (*)    All other components within normal limits  URINE CULTURE  LIPASE, BLOOD  PREGNANCY, URINE  MAGNESIUM   No results found.   No diagnosis found.    MDM  Pt comes in with cc of abd pain. ESLD with cirrhosis. Abd pain - but no fevers, pain is vague, no large ascites, and no hx of SBP or recurrent fluid aspiration. Pt has jaundice - but it's baseline. Will get basic labs, and reassess.  5:24 PM Pt's UA is + for infection. GI labs show abnormality - baseline, or slightly worse. PO challenge passed. Pain in control. Pt has GI f/u with [facility] in July. I advised her to see the GI doctor in [place] earlier - which she states she will follow up on. Has thrombocytopenia - but no active bleed. Will discharge after a  dose of ceftriaxone.  Derwood Kaplan, MD 01/25/13 1726

## 2013-01-26 LAB — URINE CULTURE

## 2013-02-01 ENCOUNTER — Telehealth: Payer: Self-pay | Admitting: Oncology

## 2013-02-01 NOTE — Telephone Encounter (Signed)
Called pt and r/s appt to 7/10 from 7/3 due to ML PAL, lab and MD

## 2013-02-04 ENCOUNTER — Other Ambulatory Visit: Payer: Self-pay | Admitting: Family Medicine

## 2013-02-07 ENCOUNTER — Telehealth: Payer: Self-pay | Admitting: Family Medicine

## 2013-02-07 NOTE — Telephone Encounter (Signed)
Patient would like to speak to the nurse about a vaginal problems.  She didn't want to get any more specific other than to say that it had to do with pubic hair.

## 2013-02-07 NOTE — Telephone Encounter (Signed)
Pt reports white on the ends of her pubic hair -similar to the time that she had "crabs" - pt denies itching, pain or discharge. Unable to come in tomorrow and wishes to wait until MOnday when she can be seen by a female. Pt encouraged to seek tx sooner at [facility] if desired.pt verbalized understanding. Agostino Haste, RN-BSN

## 2013-02-08 ENCOUNTER — Encounter (HOSPITAL_COMMUNITY): Payer: Self-pay | Admitting: Emergency Medicine

## 2013-02-08 ENCOUNTER — Ambulatory Visit: Payer: Medicare Other | Admitting: Nurse Practitioner

## 2013-02-08 ENCOUNTER — Telehealth: Payer: Self-pay | Admitting: *Deleted

## 2013-02-08 ENCOUNTER — Emergency Department (HOSPITAL_COMMUNITY): Payer: Medicare Other

## 2013-02-08 ENCOUNTER — Emergency Department (HOSPITAL_COMMUNITY)
Admission: EM | Admit: 2013-02-08 | Discharge: 2013-02-09 | Disposition: A | Discharge status: Home / Self Care | Payer: Medicare Other | Attending: Emergency Medicine | Admitting: Emergency Medicine

## 2013-02-08 ENCOUNTER — Other Ambulatory Visit: Payer: Medicare Other | Admitting: Lab

## 2013-02-08 DIAGNOSIS — Z79899 Other long term (current) drug therapy: Secondary | ICD-10-CM | POA: Insufficient documentation

## 2013-02-08 DIAGNOSIS — Z8719 Personal history of other diseases of the digestive system: Secondary | ICD-10-CM | POA: Insufficient documentation

## 2013-02-08 DIAGNOSIS — Z8679 Personal history of other diseases of the circulatory system: Secondary | ICD-10-CM | POA: Insufficient documentation

## 2013-02-08 DIAGNOSIS — Z87891 Personal history of nicotine dependence: Secondary | ICD-10-CM | POA: Insufficient documentation

## 2013-02-08 DIAGNOSIS — R600 Localized edema: Secondary | ICD-10-CM

## 2013-02-08 DIAGNOSIS — Z3202 Encounter for pregnancy test, result negative: Secondary | ICD-10-CM | POA: Insufficient documentation

## 2013-02-08 DIAGNOSIS — M7989 Other specified soft tissue disorders: Secondary | ICD-10-CM | POA: Insufficient documentation

## 2013-02-08 DIAGNOSIS — G40909 Epilepsy, unspecified, not intractable, without status epilepticus: Secondary | ICD-10-CM | POA: Insufficient documentation

## 2013-02-08 DIAGNOSIS — D696 Thrombocytopenia, unspecified: Secondary | ICD-10-CM | POA: Insufficient documentation

## 2013-02-08 LAB — COMPREHENSIVE METABOLIC PANEL
ALT: 97 U/L — ABNORMAL HIGH (ref 0–35)
AST: 239 U/L — ABNORMAL HIGH (ref 0–37)
Albumin: 1.7 g/dL — ABNORMAL LOW (ref 3.5–5.2)
Alkaline Phosphatase: 814 U/L — ABNORMAL HIGH (ref 39–117)
BUN: 5 mg/dL — ABNORMAL LOW (ref 6–23)
CO2: 27 mEq/L (ref 19–32)
Calcium: 7.6 mg/dL — ABNORMAL LOW (ref 8.4–10.5)
Chloride: 99 mEq/L (ref 96–112)
Creatinine, Ser: 0.68 mg/dL (ref 0.50–1.10)
GFR calc Af Amer: >90 mL/min (ref >90)
GFR calc non Af Amer: >90 mL/min (ref >90)
Glucose, Bld: 112 mg/dL — ABNORMAL HIGH (ref 70–99)
Potassium: 3.1 mEq/L — ABNORMAL LOW (ref 3.5–5.1)
Sodium: 133 mEq/L — ABNORMAL LOW (ref 135–145)
Total Bilirubin: 9.6 mg/dL — ABNORMAL HIGH (ref 0.3–1.2)
Total Protein: 5.8 g/dL — ABNORMAL LOW (ref 6.0–8.3)

## 2013-02-08 LAB — CBC WITH DIFFERENTIAL/PLATELET
Basophils Absolute: 0 10*3/uL (ref 0.0–0.1)
Basophils Relative: 1 % (ref 0–1)
Eosinophils Absolute: 0.1 10*3/uL (ref 0.0–0.7)
Eosinophils Relative: 3 % (ref 0–5)
HCT: 28.2 % — ABNORMAL LOW (ref 36.0–46.0)
Hemoglobin: 9.6 g/dL — ABNORMAL LOW (ref 12.0–15.0)
Lymphocytes Relative: 20 % (ref 12–46)
Lymphs Abs: 0.6 10*3/uL — ABNORMAL LOW (ref 0.7–4.0)
MCH: 34.5 pg — ABNORMAL HIGH (ref 26.0–34.0)
MCHC: 34 g/dL (ref 30.0–36.0)
MCV: 101.4 fL — ABNORMAL HIGH (ref 78.0–100.0)
Monocytes Absolute: 0.2 10*3/uL (ref 0.1–1.0)
Monocytes Relative: 8 % (ref 3–12)
Neutro Abs: 2 10*3/uL (ref 1.7–7.7)
Neutrophils Relative %: 68 % (ref 43–77)
Platelets: 23 10*3/uL — CL (ref 150–400)
RBC: 2.78 MIL/uL — ABNORMAL LOW (ref 3.87–5.11)
RDW: 13 % (ref 11.5–15.5)
WBC: 2.9 10*3/uL — ABNORMAL LOW (ref 4.0–10.5)

## 2013-02-08 LAB — URINALYSIS, ROUTINE W REFLEX MICROSCOPIC
Glucose, UA: NEGATIVE mg/dL
Ketones, ur: NEGATIVE mg/dL
Leukocytes, UA: NEGATIVE
Nitrite: NEGATIVE
Protein, ur: NEGATIVE mg/dL
Specific Gravity, Urine: 1.019 (ref 1.005–1.030)
Urobilinogen, UA: 1 mg/dL (ref 0.0–1.0)
pH: 5.5 (ref 5.0–8.0)

## 2013-02-08 LAB — URINE MICROSCOPIC-ADD ON

## 2013-02-08 LAB — PREGNANCY, URINE: Preg Test, Ur: NEGATIVE

## 2013-02-08 LAB — PRO B NATRIURETIC PEPTIDE: Pro B Natriuretic peptide (BNP): 236.3 pg/mL — ABNORMAL HIGH (ref 0–125)

## 2013-02-08 MED ORDER — SODIUM CHLORIDE 0.9 % IV BOLUS (SEPSIS): 1000.0000 mL | Freq: Once | INTRAVENOUS | Status: DC

## 2013-02-08 MED ORDER — HYDROXYZINE HCL 25 MG PO TABS
25.0000 mg | ORAL_TABLET | Freq: Once | ORAL | Status: AC
  Administered 2013-02-08: 25 mg via ORAL
  Filled 2013-02-08: qty 1

## 2013-02-08 MED ORDER — DIPHENHYDRAMINE HCL 50 MG/ML IJ SOLN
25.0000 mg | Freq: Once | INTRAMUSCULAR | Status: AC
  Administered 2013-02-08: 25 mg via INTRAVENOUS
  Filled 2013-02-08: qty 1

## 2013-02-08 MED ORDER — OXYCODONE-ACETAMINOPHEN 5-325 MG PO TABS
2.0000 | ORAL_TABLET | Freq: Once | ORAL | Status: AC
  Administered 2013-02-08: 2 via ORAL
  Filled 2013-02-08: qty 2

## 2013-02-08 MED ORDER — FUROSEMIDE 10 MG/ML IJ SOLN
40.0000 mg | Freq: Once | INTRAMUSCULAR | Status: AC
  Administered 2013-02-08: 40 mg via INTRAVENOUS
  Filled 2013-02-08: qty 4

## 2013-02-08 MED ORDER — MORPHINE SULFATE 4 MG/ML IJ SOLN
4.0000 mg | Freq: Once | INTRAMUSCULAR | Status: AC
  Administered 2013-02-08: 4 mg via INTRAVENOUS
  Filled 2013-02-08: qty 1

## 2013-02-08 NOTE — ED Notes (Signed)
Pt here for bilat leg and feet swelling pt stated that even after taking here diuretic pills no relief denies n/v some sob

## 2013-02-08 NOTE — ED Provider Notes (Signed)
History    CSN: [id number] Arrival date & time 02/08/13  1646  First MD Initiated Contact with Patient 02/08/13 1702     Chief Complaint  Patient presents with  . Leg Swelling   (Consider location/radiation/quality/duration/timing/severity/associated sxs/prior Treatment) HPI Patient presents to the emergency department with bilateral lower extremity swelling.  The patient, states, that she normally gets some swelling, but this seems worse.  The patient, states, that she took her spironolactone and Lasix, but did not notice any relief patient denies back pain,chest pain, shortness of breath, nausea, vomiting, abdominal pain, headache, blurred vision, diarrhea, dysuria or syncope.  Patient, states she did not take any other medications prior to arrival.  Patient, states, that nothing seems to make her condition, better or worse. patient, states she feels, like she has some abdominal distention. Past Medical History  Diagnosis Date  . Substance abuse April 2013    Cocaine plus THC usage - delisted from liver transplant list.   . Esophageal varices     distal esophageal grade 1 varices, portal gastropathy on EGDs in 01/2010 and 07/2012:  no banding or other intervention undertaken on EGDs from 2009 - 07/2012.    [name] Cirrhosis of liver   . Autoimmune hepatitis     Confirmed via liver biopsy 2002  . PSC (primary sclerosing cholangitis)   . Seizures     onset in 2009 associated with vent requiring resp failure.   . Thrombocytopenia     associated with cirrhosis.  followed by Dr Alcide Evener  . Intracranial hemorrhage 2009    in association with severe thrombocytopenia   Past Surgical History  Procedure Laterality Date  . Esophagogastroduodenoscopy  07/18/2012    Procedure: ESOPHAGOGASTRODUODENOSCOPY (EGD);  Surgeon: Louis Meckel, MD;  Location: [facility];  Service: Endoscopy;  Laterality: N/A;  . Gastric varices banding  07/18/2012    Procedure: GASTRIC VARICES BANDING;  Surgeon: Louis Meckel, MD;  Location: [facility];  Service: Endoscopy;  Laterality: N/A;  . Ercp  01/2010    Sclerosing Cholangitis with no dominant stricures    Family History  Problem Relation Age of Onset  . Cancer Neg Hx    History  Substance Use Topics  . Smoking status: Former Smoker    Types: Cigarettes    Quit date: 08/09/2005  . Smokeless tobacco: Never Used  . Alcohol Use: No   OB History   Grav Para Term Preterm Abortions TAB SAB Ect Mult Living                 Review of Systems All other systems negative except as documented in the HPI. All pertinent positives and negatives as reviewed in the HPI. Allergies  Ibuprofen  Home Medications   Current Outpatient Rx  Name  Route  Sig  Dispense  Refill  . CARAFATE 1 GM/10ML suspension      take 10 milliliters (2 teaspoonfuls) by mouth twice a day if needed for STOMACH PAIN   420 mL   12   . EPINEPHrine (EPI-PEN) 0.3 mg/0.3 mL DEVI   Intramuscular   Inject 0.3 mg into the muscle once.         . folic acid (FOLVITE) 1 MG tablet   Oral   Take 1 tablet (1 mg total) by mouth daily.   60 tablet   1   . furosemide (LASIX) 40 MG tablet   Oral   Take 80 mg by mouth 2 (two) times daily.          [name]  HYDROcodone-acetaminophen (NORCO) 5-325 MG per tablet   Oral   Take 1-2 tablets by mouth every 6 (six) hours as needed for pain.   30 tablet   0   . hydrOXYzine (ATARAX/VISTARIL) 25 MG tablet   Oral   Take 1 tablet (25 mg total) by mouth every 6 (six) hours as needed for itching.   21 tablet   0   . Lactulose 20 GM/30ML SOLN   Oral   Take 30 mLs (20 g total) by mouth daily as needed. For constipation   500 mL   0   . levETIRAcetam (KEPPRA) 750 MG tablet   Oral   Take 750 mg by mouth every 12 (twelve) hours.         . nadolol (CORGARD) 20 MG tablet   Oral   Take 20 mg by mouth at bedtime.          [name] oxyCODONE-acetaminophen (PERCOCET/ROXICET) 5-325 MG per tablet   Oral   Take 1 tablet by mouth every 4 (four)  hours as needed for pain.   10 tablet   0   . pantoprazole (PROTONIX) 40 MG tablet   Oral   Take 40 mg by mouth daily.          . potassium chloride SA (K-DUR,KLOR-CON) 20 MEQ tablet   Oral   Take 20 mEq by mouth 2 (two) times daily.         . promethazine (PHENERGAN) 25 MG suppository   Rectal   Place 25 mg rectally every 6 (six) hours as needed. For nausea         . promethazine (PHENERGAN) 25 MG tablet   Oral   Take 1 tablet (25 mg total) by mouth every 6 (six) hours as needed for nausea.   30 tablet   0   . spironolactone (ALDACTONE) 100 MG tablet      take 1 tablet by mouth once daily   60 tablet   0   . thiamine 100 MG tablet   Oral   Take 1 tablet (100 mg total) by mouth daily.   120 tablet   3    BP 129/61  Pulse 103  Temp(Src) 98.3 F (36.8 C) (Oral)  Resp 15  SpO2 100% Physical Exam  Nursing note and vitals reviewed. Constitutional: She is oriented to person, place, and time. She appears well-developed and well-nourished. No distress.  HENT:  Head: Normocephalic and atraumatic.  Mouth/Throat: Oropharynx is clear and moist.  Eyes: Pupils are equal, round, and reactive to light.  Neck: Normal range of motion. Neck supple.  Cardiovascular: Normal rate, regular rhythm and normal heart sounds.  Exam reveals no gallop and no friction rub.   No murmur heard. Pulmonary/Chest: Effort normal and breath sounds normal.  Abdominal: Soft. Bowel sounds are normal. She exhibits no distension. There is no tenderness. There is no CVA tenderness. No hernia.  Neurological: She is alert and oriented to person, place, and time.  Skin: Skin is warm and dry. No rash noted.    ED Course  Procedures (including critical care time) Labs Reviewed  CBC WITH DIFFERENTIAL - Abnormal; Notable for the following:    WBC 2.9 (*)    RBC 2.78 (*)    Hemoglobin 9.6 (*)    HCT 28.2 (*)    MCV 101.4 (*)    MCH 34.5 (*)    Platelets 23 (*)    Lymphs Abs 0.6 (*)    All other  components within normal  limits  URINALYSIS, ROUTINE W REFLEX MICROSCOPIC - Abnormal; Notable for the following:    Color, Urine AMBER (*)    APPearance CLOUDY (*)    Hgb urine dipstick MODERATE (*)    Bilirubin Urine LARGE (*)    All other components within normal limits  COMPREHENSIVE METABOLIC PANEL - Abnormal; Notable for the following:    Sodium 133 (*)    Potassium 3.1 (*)    Glucose, Bld 112 (*)    BUN 5 (*)    Calcium 7.6 (*)    Total Protein 5.8 (*)    Albumin 1.7 (*)    AST 239 (*)    ALT 97 (*)    Alkaline Phosphatase 814 (*)    Total Bilirubin 9.6 (*)    All other components within normal limits  PRO B NATRIURETIC PEPTIDE - Abnormal; Notable for the following:    Pro B Natriuretic peptide (BNP) 236.3 (*)    All other components within normal limits  URINE MICROSCOPIC-ADD ON - Abnormal; Notable for the following:    Casts HYALINE CASTS (*)    All other components within normal limits  PREGNANCY, URINE   Dg Chest 2 View  02/08/2013   *RADIOLOGY REPORT*  Clinical Data: Shortness of breath  CHEST - 2 VIEW  Comparison: Prior radiograph from 12/31/2012  Findings: The cardiac and mediastinal silhouettes are stable in size and contour as compared to the prior examination.  Focal eventration of the medial left diaphragm is unchanged.  The lungs are well inflated.  No airspace consolidation is identified.  There is no pleural effusion or pulmonary edema.  No pneumothorax.  No acute osseous abnormality.  IMPRESSION: No acute cardiopulmonary process.   Original Report Authenticated By: Rise Mu, M.D.   Patient is at her baseline with her testing.  The patient is advised return here as needed.  Patient is advised followup with her primary care doctor, which she has an appointment on Monday with.  We explained to the patient that at this time.  She can followup with her doctor if patient becomes very upset and is talking very loudly saying, that we're making her home, and  kicking her out explained to the patient that at this time.  She can go home.  Dr. Rhunette Croft also saw the patient and feels that she can follow up as an outpatient.  MDM  MDM Reviewed: nursing note and vitals Interpretation: labs, ECG and x-ray     Carlyle Dolly, PA-C 02/09/13 0004

## 2013-02-08 NOTE — ED Notes (Signed)
PT ABLE TO AMBULATE TO BATHROOM WITH STAND-BY ASSIST

## 2013-02-08 NOTE — ED Notes (Signed)
Pt observed ambulating to desk without assist.

## 2013-02-08 NOTE — Telephone Encounter (Signed)
Pt calling concerned that she is having bilateral leg edema to the point that her finger is leaving an indentation in her leg and her feet are numb - also states that her abdomen is distended and sounds hollow. Pt states she has an appointment on Monday and can she wait until then. Instructed to go IMMEDIATELY to the ED for further evaluation and that it could not wait with possible life threatening outcomes if she did not seek immediate attention. "Well I really didn't want to go but I guess I will" pt verbalizes understanding and states that she will go to ED. Witzke Haste, RN-BSN

## 2013-02-08 NOTE — ED Notes (Signed)
Assisted pt to restroom and gave pt some apple juice.

## 2013-02-08 NOTE — ED Notes (Signed)
Patient unable to urinate at this time,is aware we need a specimen

## 2013-02-08 NOTE — ED Notes (Signed)
Pt reports feeling itchy all over.  Ebbie Ridge, PA made aware.

## 2013-02-09 ENCOUNTER — Emergency Department (HOSPITAL_COMMUNITY): Payer: Medicare Other

## 2013-02-09 ENCOUNTER — Encounter (HOSPITAL_COMMUNITY): Payer: Self-pay | Admitting: Emergency Medicine

## 2013-02-09 ENCOUNTER — Emergency Department (HOSPITAL_COMMUNITY)
Admission: EM | Admit: 2013-02-09 | Discharge: 2013-02-09 | Disposition: A | Discharge status: Home / Self Care | Payer: Medicare Other | Source: Home / Self Care | Attending: Emergency Medicine | Admitting: Emergency Medicine

## 2013-02-09 DIAGNOSIS — Z87891 Personal history of nicotine dependence: Secondary | ICD-10-CM | POA: Insufficient documentation

## 2013-02-09 DIAGNOSIS — R609 Edema, unspecified: Secondary | ICD-10-CM

## 2013-02-09 DIAGNOSIS — D696 Thrombocytopenia, unspecified: Secondary | ICD-10-CM | POA: Insufficient documentation

## 2013-02-09 DIAGNOSIS — Z8619 Personal history of other infectious and parasitic diseases: Secondary | ICD-10-CM | POA: Insufficient documentation

## 2013-02-09 DIAGNOSIS — F1910 Other psychoactive substance abuse, uncomplicated: Secondary | ICD-10-CM | POA: Insufficient documentation

## 2013-02-09 DIAGNOSIS — Z8669 Personal history of other diseases of the nervous system and sense organs: Secondary | ICD-10-CM | POA: Insufficient documentation

## 2013-02-09 DIAGNOSIS — Z8679 Personal history of other diseases of the circulatory system: Secondary | ICD-10-CM | POA: Insufficient documentation

## 2013-02-09 DIAGNOSIS — Z8719 Personal history of other diseases of the digestive system: Secondary | ICD-10-CM | POA: Insufficient documentation

## 2013-02-09 DIAGNOSIS — Z79899 Other long term (current) drug therapy: Secondary | ICD-10-CM | POA: Insufficient documentation

## 2013-02-09 MED ORDER — POTASSIUM CHLORIDE CRYS ER 20 MEQ PO TBCR: 40.0000 meq | EXTENDED_RELEASE_TABLET | Freq: Once | ORAL | Status: DC

## 2013-02-09 MED ORDER — POTASSIUM CHLORIDE CRYS ER 20 MEQ PO TBCR: 20.0000 meq | EXTENDED_RELEASE_TABLET | Freq: Three times a day (TID) | ORAL | Status: DC

## 2013-02-09 NOTE — ED Notes (Addendum)
Patient left before tests completed.  Dr. Rosalia Hammers aware.  Medication not given also

## 2013-02-09 NOTE — ED Provider Notes (Signed)
History    CSN: [id number] Arrival date & time 02/09/13  1449  First MD Initiated Contact with Patient 02/09/13 1525     Chief Complaint  Patient presents with  . Foot Swelling   (Consider location/radiation/quality/duration/timing/severity/associated sxs/prior Treatment) HPI  Patient presents with bilateral ankle swelling since 4 days.  Swelling is bilateral and c.w. Previous peripheral edema patient has had.  NO chest pain or sob.  Patient states no etoh for 4-5 weeks.  Patient seen this am for same.  States taking spironolactone 200 today and increased lasix to 120 mg this am x 1.  States urinated twice since she's been home.  States nothing is working.  The doctors here are mean to me and aren't helping.  Denies dyspnea or chest pain.  Patient with complicated history of liver cirrhosis and etoh abuse.   Past Medical History  Diagnosis Date  . Substance abuse April 2013    Cocaine plus THC usage - delisted from liver transplant list.   . Esophageal varices     distal esophageal grade 1 varices, portal gastropathy on EGDs in 01/2010 and 07/2012:  no banding or other intervention undertaken on EGDs from 2009 - 07/2012.    [name] Cirrhosis of liver   . Autoimmune hepatitis     Confirmed via liver biopsy 2002  . PSC (primary sclerosing cholangitis)   . Seizures     onset in 2009 associated with vent requiring resp failure.   . Thrombocytopenia     associated with cirrhosis.  followed by Dr Alcide Evener  . Intracranial hemorrhage 2009    in association with severe thrombocytopenia   Past Surgical History  Procedure Laterality Date  . Esophagogastroduodenoscopy  07/18/2012    Procedure: ESOPHAGOGASTRODUODENOSCOPY (EGD);  Surgeon: Louis Meckel, MD;  Location: [facility];  Service: Endoscopy;  Laterality: N/A;  . Gastric varices banding  07/18/2012    Procedure: GASTRIC VARICES BANDING;  Surgeon: Louis Meckel, MD;  Location: [facility];  Service: Endoscopy;  Laterality: N/A;  . Ercp   01/2010    Sclerosing Cholangitis with no dominant stricures    Family History  Problem Relation Age of Onset  . Cancer Neg Hx    History  Substance Use Topics  . Smoking status: Former Smoker    Types: Cigarettes    Quit date: 08/09/2005  . Smokeless tobacco: Never Used  . Alcohol Use: No   OB History   Grav Para Term Preterm Abortions TAB SAB Ect Mult Living                 Review of Systems  All other systems reviewed and are negative.    Allergies  Ibuprofen  Home Medications   Current Outpatient Rx  Name  Route  Sig  Dispense  Refill  . folic acid (FOLVITE) 1 MG tablet   Oral   Take 1 tablet (1 mg total) by mouth daily.   60 tablet   1   . furosemide (LASIX) 40 MG tablet   Oral   Take 80 mg by mouth 2 (two) times daily.          . hydrOXYzine (ATARAX/VISTARIL) 25 MG tablet   Oral   Take 1 tablet (25 mg total) by mouth every 6 (six) hours as needed for itching.   21 tablet   0   . lactulose (CHRONULAC) 10 GM/15ML solution   Oral   Take 20 g by mouth daily as needed (for constipation).         [name]  levETIRAcetam (KEPPRA) 750 MG tablet   Oral   Take 750 mg by mouth every 12 (twelve) hours.         . nadolol (CORGARD) 20 MG tablet   Oral   Take 20 mg by mouth at bedtime.          [name] oxyCODONE-acetaminophen (PERCOCET/ROXICET) 5-325 MG per tablet   Oral   Take 1 tablet by mouth every 4 (four) hours as needed for pain.   10 tablet   0   . pantoprazole (PROTONIX) 40 MG tablet   Oral   Take 40 mg by mouth daily.          . potassium chloride SA (K-DUR,KLOR-CON) 20 MEQ tablet   Oral   Take 20 mEq by mouth 2 (two) times daily.         . promethazine (PHENERGAN) 25 MG suppository   Rectal   Place 25 mg rectally every 6 (six) hours as needed for nausea.          . promethazine (PHENERGAN) 25 MG tablet   Oral   Take 1 tablet (25 mg total) by mouth every 6 (six) hours as needed for nausea.   30 tablet   0   . spironolactone  (ALDACTONE) 100 MG tablet   Oral   Take 100 mg by mouth daily.         . sucralfate (CARAFATE) 1 GM/10ML suspension   Oral   Take 1 g by mouth 2 (two) times daily as needed (for stomach pain).         [name] thiamine 100 MG tablet   Oral   Take 1 tablet (100 mg total) by mouth daily.   120 tablet   3   . EPINEPHrine (EPI-PEN) 0.3 mg/0.3 mL DEVI   Intramuscular   Inject 0.3 mg into the muscle as needed (for allergic reaction).           BP 117/68  Pulse 95  Temp(Src) 98.4 F (36.9 C) (Oral)  Resp 20  Wt 170 lb (77.111 kg)  BMI 29.17 kg/m2  SpO2 100% Physical Exam  Nursing note and vitals reviewed. Constitutional: She is oriented to person, place, and time. She appears well-developed and well-nourished.  HENT:  Head: Normocephalic and atraumatic.  Right Ear: External ear normal.  Left Ear: External ear normal.  Mouth/Throat: Oropharynx is clear and moist.  Eyes: Conjunctivae are normal. Pupils are equal, round, and reactive to light.  Neck: Normal range of motion. Neck supple.  Cardiovascular: Normal rate, regular rhythm and normal heart sounds.   Pulmonary/Chest: Effort normal and breath sounds normal.  Abdominal: Soft. Bowel sounds are normal.  Musculoskeletal: Normal range of motion. She exhibits edema.  Bilateral lower leg edema to knee, no arm or other edema noted.  Neurological: She is alert and oriented to person, place, and time. She has normal reflexes.  Skin: Skin is warm and dry.  Psychiatric: She has a normal mood and affect. Her behavior is normal. Judgment and thought content normal.    ED Course  Procedures (including critical care time) Labs Reviewed  COMPREHENSIVE METABOLIC PANEL  CBC WITH DIFFERENTIAL  URINALYSIS, ROUTINE W REFLEX MICROSCOPIC  AMMONIA   Dg Chest 2 View  02/08/2013   *RADIOLOGY REPORT*  Clinical Data: Shortness of breath  CHEST - 2 VIEW  Comparison: Prior radiograph from 12/31/2012  Findings: The cardiac and mediastinal  silhouettes are stable in size and contour as compared to the prior examination.  Focal eventration of  the medial left diaphragm is unchanged.  The lungs are well inflated.  No airspace consolidation is identified.  There is no pleural effusion or pulmonary edema.  No pneumothorax.  No acute osseous abnormality.  IMPRESSION: No acute cardiopulmonary process.   Original Report Authenticated By: Rise Mu, M.D.   No diagnosis found. Results for orders placed during the hospital encounter of 02/08/13  CBC WITH DIFFERENTIAL      Result Value Range   WBC 2.9 (*) 4.0 - 10.5 K/uL   RBC 2.78 (*) 3.87 - 5.11 MIL/uL   Hemoglobin 9.6 (*) 12.0 - 15.0 g/dL   HCT 81.1 (*) 91.4 - 78.2 %   MCV 101.4 (*) 78.0 - 100.0 fL   MCH 34.5 (*) 26.0 - 34.0 pg   MCHC 34.0  30.0 - 36.0 g/dL   RDW 95.6  21.3 - 08.6 %   Platelets 23 (*) 150 - 400 K/uL   Neutrophils Relative % 68  43 - 77 %   Lymphocytes Relative 20  12 - 46 %   Monocytes Relative 8  3 - 12 %   Eosinophils Relative 3  0 - 5 %   Basophils Relative 1  0 - 1 %   Neutro Abs 2.0  1.7 - 7.7 K/uL   Lymphs Abs 0.6 (*) 0.7 - 4.0 K/uL   Monocytes Absolute 0.2  0.1 - 1.0 K/uL   Eosinophils Absolute 0.1  0.0 - 0.7 K/uL   Basophils Absolute 0.0  0.0 - 0.1 K/uL   Smear Review PLATELET COUNT CONFIRMED BY SMEAR    URINALYSIS, ROUTINE W REFLEX MICROSCOPIC      Result Value Range   Color, Urine AMBER (*) YELLOW   APPearance CLOUDY (*) CLEAR   Specific Gravity, Urine 1.019  1.005 - 1.030   pH 5.5  5.0 - 8.0   Glucose, UA NEGATIVE  NEGATIVE mg/dL   Hgb urine dipstick MODERATE (*) NEGATIVE   Bilirubin Urine LARGE (*) NEGATIVE   Ketones, ur NEGATIVE  NEGATIVE mg/dL   Protein, ur NEGATIVE  NEGATIVE mg/dL   Urobilinogen, UA 1.0  0.0 - 1.0 mg/dL   Nitrite NEGATIVE  NEGATIVE   Leukocytes, UA NEGATIVE  NEGATIVE  COMPREHENSIVE METABOLIC PANEL      Result Value Range   Sodium 133 (*) 135 - 145 mEq/L   Potassium 3.1 (*) 3.5 - 5.1 mEq/L   Chloride 99  96 -  112 mEq/L   CO2 27  19 - 32 mEq/L   Glucose, Bld 112 (*) 70 - 99 mg/dL   BUN 5 (*) 6 - 23 mg/dL   Creatinine, Ser 5.78  0.50 - 1.10 mg/dL   Calcium 7.6 (*) 8.4 - 10.5 mg/dL   Total Protein 5.8 (*) 6.0 - 8.3 g/dL   Albumin 1.7 (*) 3.5 - 5.2 g/dL   AST 469 (*) 0 - 37 U/L   ALT 97 (*) 0 - 35 U/L   Alkaline Phosphatase 814 (*) 39 - 117 U/L   Total Bilirubin 9.6 (*) 0.3 - 1.2 mg/dL   GFR calc non Af Amer >90  >90 mL/min   GFR calc Af Amer >90  >90 mL/min  PREGNANCY, URINE      Result Value Range   Preg Test, Ur NEGATIVE  NEGATIVE  PRO B NATRIURETIC PEPTIDE      Result Value Range   Pro B Natriuretic peptide (BNP) 236.3 (*) 0 - 125 pg/mL  URINE MICROSCOPIC-ADD ON      Result Value Range  RBC / HPF 3-6  <3 RBC/hpf   Casts HYALINE CASTS (*) NEGATIVE   Urine-Other MUCOUS PRESENT      MDM  Patient with peripheral edema with chronicity with worsening of symptoms but no evidence of chf.  Discussed with patient that symptoms will likely not resolve rapidly and needs to continue with elevation of extremities and diuretics and sobriety.  No symptoms of dvt, chf, or renal failure.  Labs done at [facility] and I do not plan on repeating.  Plan potassium and advised recheck with pmd at [facility].  1-anemia- stable from prior- with hemoglobin at 9 2- thrombocytopenia - platelelets 23,000- no s/s bleeding patient with known thrombocytopenia 3- liver failure- appears stable patient reports not drinking for several weeks.   Patient appears angry and wants symptoms to resolve immediately.  I have explained that she has chronic problems and we have no quick treatment or easy solution and advised to continue her alcohol abstinence and conservative treatments.  She does need close follow up for her cbc and electrolytes especially with increased lasix.    Hilario Quarry, MD 02/09/13 [phone]

## 2013-02-09 NOTE — ED Notes (Signed)
Pt upset and crying, stating

## 2013-02-09 NOTE — ED Notes (Addendum)
Patient was here yesterday for same symptoms of bilateral lower extremity swelling.  Patient has history of cirrhosis of the liver.  Patient reports SOB and blurring of vision.  Patient reports she has not urinated for 4-5 hours.  Patient very lethargic and acts like she is having trouble staying awake.

## 2013-02-12 ENCOUNTER — Ambulatory Visit: Payer: Medicare Other | Admitting: Family Medicine

## 2013-02-13 ENCOUNTER — Ambulatory Visit: Payer: Medicare Other | Admitting: Family Medicine

## 2013-02-14 NOTE — ED Provider Notes (Signed)
Medical screening examination/treatment/procedure(s) were performed by non-physician practitioner and as supervising physician I was immediately available for consultation/collaboration.  Derwood Kaplan, MD 02/14/13 [phone]

## 2013-02-15 ENCOUNTER — Ambulatory Visit: Payer: Medicare Other | Admitting: Nurse Practitioner

## 2013-02-15 ENCOUNTER — Other Ambulatory Visit: Payer: Medicare Other | Admitting: Lab

## 2013-02-22 ENCOUNTER — Ambulatory Visit: Payer: Medicare Other | Admitting: Family Medicine

## 2013-02-23 ENCOUNTER — Ambulatory Visit: Payer: Medicare Other | Admitting: Nurse Practitioner

## 2013-02-23 ENCOUNTER — Other Ambulatory Visit: Payer: Medicare Other | Admitting: Lab

## 2013-03-16 ENCOUNTER — Ambulatory Visit: Payer: Medicare Other | Admitting: Family Medicine

## 2013-03-22 ENCOUNTER — Ambulatory Visit (INDEPENDENT_AMBULATORY_CARE_PROVIDER_SITE_OTHER): Payer: Medicare Other | Admitting: Family Medicine

## 2013-03-22 ENCOUNTER — Other Ambulatory Visit: Payer: Self-pay | Admitting: Family Medicine

## 2013-03-22 ENCOUNTER — Encounter: Payer: Self-pay | Admitting: Family Medicine

## 2013-03-22 VITALS — BP 123/53 | HR 70 | Temp 98.9°F | Ht 64.0 in | Wt 172.0 lb

## 2013-03-22 DIAGNOSIS — I8500 Esophageal varices without bleeding: Secondary | ICD-10-CM

## 2013-03-22 DIAGNOSIS — K759 Inflammatory liver disease, unspecified: Secondary | ICD-10-CM

## 2013-03-22 LAB — COMPREHENSIVE METABOLIC PANEL
ALT: 56 U/L — ABNORMAL HIGH (ref 0–35)
AST: 110 U/L — ABNORMAL HIGH (ref 0–37)
Alkaline Phosphatase: 430 U/L — ABNORMAL HIGH (ref 39–117)
CO2: 26 mEq/L (ref 19–32)
Creat: 0.63 mg/dL (ref 0.50–1.10)
Sodium: 136 mEq/L (ref 135–145)
Total Bilirubin: 7.4 mg/dL — ABNORMAL HIGH (ref 0.3–1.2)
Total Protein: 5.9 g/dL — ABNORMAL LOW (ref 6.0–8.3)

## 2013-03-22 NOTE — Telephone Encounter (Signed)
Pt needs a refill on aldactone. She was here today and forgot to ask for a refill . JW

## 2013-03-22 NOTE — Progress Notes (Addendum)
Subjective:     Patient ID: [name], female   DOB: September 10, 1985, 27 y.o.   MRN: [id number]  HPI 27 y.o. F w/ cirrhosis presents for evaluation of rahses. Recently discharged from hospital for esophageal varicial bleeding. Pt did not require transfusions at that time. Pt has been taking 200mg  spironalactone twice daily (written for daily) and lasix 160mg  TID. Pt reports decrease uop since Tuesday. Pt is currently 20lbs above discharge weight from July.   Pt rashes presents for 2 weeks. On bilateral arms. No itching but  There has been increase in number for lesions.   Otherwise pt feeling well. Eating OK, drinking H20, juice gatorade. Eating popsicles, and salty foods.   Able to lay flat, breathing OK, increased fatigue. Pt not taking lactulose. Pt having frequent BMs 3+ times per day soft BMs occasionally black three time in the last month.    Review of Systems  Constitutional: Negative for chills, activity change (feeling tired at baseline) and appetite change.  HENT: Negative for facial swelling and neck pain.   Respiratory: Negative for apnea, chest tightness and shortness of breath.   Cardiovascular: Positive for leg swelling (Significant leg swelling). Negative for chest pain.  Gastrointestinal: Positive for diarrhea and abdominal distention (Mild distention). Negative for abdominal pain, constipation, blood in stool (occ blck stool) and anal bleeding.  Endocrine: Polydipsia: Multiple BMs a day.  Skin: Positive for rash (Patient with abrasions on bilateral arms).  Neurological: Negative for dizziness, facial asymmetry and headaches.       Objective:   Physical Exam  Nursing note and vitals reviewed. Constitutional: She is oriented to person, place, and time. No distress.  HENT:  Head: Normocephalic and atraumatic.  Eyes: Pupils are equal, round, and reactive to light. Right eye exhibits no discharge. Left eye exhibits no discharge. Scleral icterus is present.  Neck: Neck  supple.  Cardiovascular: Normal rate, regular rhythm, normal heart sounds and intact distal pulses.  Exam reveals no gallop and no friction rub.   No murmur heard. Pulmonary/Chest: Effort normal and breath sounds normal. No respiratory distress. She has no wheezes. She has no rales. She exhibits no tenderness.  Abdominal: Bowel sounds are normal. She exhibits distension (Mildly distended). She exhibits no mass. There is tenderness (Mild tenderness on the right quadrant). There is no rebound and no guarding.  Musculoskeletal: Normal range of motion. She exhibits edema (And bilateral 3+ edema to the knees laterally with good pulses).  Neurological: She is alert and oriented to person, place, and time.  Skin: Rash (Patient with punctate abrasions on bilateral arms) noted.  Psychiatric: She has a normal mood and affect. Her behavior is normal. Judgment and thought content normal.    CBC    Component Value Date/Time   WBC 2.7* 03/22/2013 1416   WBC 4.7 08/14/2012 1555   RBC 2.90* 03/22/2013 1416   RBC 2.04* 12/24/2012 0520   RBC 2.81* 08/14/2012 1555   HGB 9.6* 03/22/2013 1416   HGB 11.2* 08/14/2012 1555   HCT 27.7* 03/22/2013 1416   HCT 32.1* 08/14/2012 1555   PLT 51* 03/22/2013 1416   PLT 63* 08/14/2012 1555   MCV 95.5 03/22/2013 1416   MCV 114.2* 08/14/2012 1555   MCH 33.1 03/22/2013 1416   MCH 39.8* 08/14/2012 1555   MCHC 34.7 03/22/2013 1416   MCHC 34.8 08/14/2012 1555   RDW 16.1* 03/22/2013 1416   RDW 14.5 08/14/2012 1555   LYMPHSABS 0.6* 03/22/2013 1416   LYMPHSABS 0.6* 08/14/2012 1555   MONOABS  0.2 03/22/2013 1416   MONOABS 0.4 08/14/2012 1555   EOSABS 0.1 03/22/2013 1416   EOSABS 0.1 08/14/2012 1555   BASOSABS 0.0 03/22/2013 1416   BASOSABS 0.0 08/14/2012 1555     CMP     Component Value Date/Time   NA 136 03/22/2013 1416   NA 133* 08/07/2012 1422   K 4.1 03/22/2013 1416   K 3.9 08/07/2012 1422   CL 108 03/22/2013 1416   CL 100 08/07/2012 1422   CO2 26 03/22/2013 1416   CO2 24 08/07/2012 1422    GLUCOSE 93 03/22/2013 1416   GLUCOSE 224* 08/07/2012 1422   BUN 11 03/22/2013 1416   BUN 10.0 08/07/2012 1422   CREATININE 0.63 03/22/2013 1416   CREATININE 0.68 02/08/2013 1703   CREATININE 0.7 08/07/2012 1422   CALCIUM 7.9* 03/22/2013 1416   CALCIUM 7.9* 08/07/2012 1422   PROT 5.9* 03/22/2013 1416   PROT 5.5* 06/20/2012 1212   ALBUMIN <2.0* 03/22/2013 1416   ALBUMIN 1.5* 06/20/2012 1212   AST 110* 03/22/2013 1416   AST 239* 06/20/2012 1212   ALT 56* 03/22/2013 1416   ALT 67* 06/20/2012 1212   ALKPHOS 430* 03/22/2013 1416   ALKPHOS 1,104* 06/20/2012 1212   BILITOT 7.4* 03/22/2013 1416   BILITOT 17.47* 06/20/2012 1212   GFRNONAA >90 02/08/2013 1703   GFRAA >90 02/08/2013 1703         Assessment:     27 year old female here for complaints of rash. Patient also with hospital followup. Recently discharged for esophageal varices secondary to hepatitis.    Plan:     Decreased Urine Out put Patient now with worsening edema, not responsive patient's increasing diuretic therapy. Recommended decrease salt intake. Also will weigh in less than one week for reevaluate. -Continue current diuretic therapy as previously prescribed - eval renal function and MELD score. - decrease salt in diet - no gatorade/fastfood/premade meals.  #Abrasions: appear self inflicted from itching. Recommended vasoline or neosporin on wounds.  # Primary sclerosis cholangitis, autoimmune hepatitis, non-alcoholic cirrhosis:  Will reevaluate labs for MELD score to monitor. Pt Itching likely from elevated bilirubin levels with obvious jaundice. - Will continue home Lasix, Lactulose, Spironolactone, and Nadalol.  - referral to revisit GI as recent change in medication and stopped Azathioprine 2/2 pancytopenia. - repeat CBC today  Pt also reports sober for >71month and may be able to establish T0 for transplant if indicatd.  MELD Score 19

## 2013-03-23 LAB — CBC WITH DIFFERENTIAL/PLATELET
Basophils Absolute: 0 10*3/uL (ref 0.0–0.1)
Basophils Relative: 1 % (ref 0–1)
Eosinophils Absolute: 0.1 10*3/uL (ref 0.0–0.7)
HCT: 27.7 % — ABNORMAL LOW (ref 36.0–46.0)
Hemoglobin: 9.6 g/dL — ABNORMAL LOW (ref 12.0–15.0)
Lymphocytes Relative: 21 % (ref 12–46)
MCHC: 34.7 g/dL (ref 30.0–36.0)
MCV: 95.5 fL (ref 78.0–100.0)
Neutro Abs: 1.8 10*3/uL (ref 1.7–7.7)
WBC: 2.7 10*3/uL — ABNORMAL LOW (ref 4.0–10.5)

## 2013-03-24 ENCOUNTER — Encounter (HOSPITAL_COMMUNITY): Payer: Self-pay

## 2013-03-24 ENCOUNTER — Emergency Department (HOSPITAL_COMMUNITY): Payer: Medicare Other

## 2013-03-24 ENCOUNTER — Inpatient Hospital Stay (HOSPITAL_COMMUNITY)
Admission: EM | Admit: 2013-03-24 | Discharge: 2013-03-27 | DRG: 871 | Disposition: A | Discharge status: Home / Self Care | Payer: Medicare Other | Attending: Family Medicine | Admitting: Family Medicine

## 2013-03-24 DIAGNOSIS — A419 Sepsis, unspecified organism: Secondary | ICD-10-CM | POA: Diagnosis present

## 2013-03-24 DIAGNOSIS — Z79899 Other long term (current) drug therapy: Secondary | ICD-10-CM

## 2013-03-24 DIAGNOSIS — I498 Other specified cardiac arrhythmias: Secondary | ICD-10-CM | POA: Diagnosis present

## 2013-03-24 DIAGNOSIS — K766 Portal hypertension: Secondary | ICD-10-CM | POA: Diagnosis present

## 2013-03-24 DIAGNOSIS — E7220 Disorder of urea cycle metabolism, unspecified: Secondary | ICD-10-CM | POA: Diagnosis present

## 2013-03-24 DIAGNOSIS — R109 Unspecified abdominal pain: Secondary | ICD-10-CM

## 2013-03-24 DIAGNOSIS — Z8673 Personal history of transient ischemic attack (TIA), and cerebral infarction without residual deficits: Secondary | ICD-10-CM

## 2013-03-24 DIAGNOSIS — F1410 Cocaine abuse, uncomplicated: Secondary | ICD-10-CM | POA: Diagnosis present

## 2013-03-24 DIAGNOSIS — R011 Cardiac murmur, unspecified: Secondary | ICD-10-CM | POA: Clinically undetermined

## 2013-03-24 DIAGNOSIS — E871 Hypo-osmolality and hyponatremia: Secondary | ICD-10-CM

## 2013-03-24 DIAGNOSIS — L299 Pruritus, unspecified: Secondary | ICD-10-CM | POA: Diagnosis present

## 2013-03-24 DIAGNOSIS — K7460 Unspecified cirrhosis of liver: Secondary | ICD-10-CM

## 2013-03-24 DIAGNOSIS — G9340 Encephalopathy, unspecified: Secondary | ICD-10-CM | POA: Diagnosis present

## 2013-03-24 DIAGNOSIS — E162 Hypoglycemia, unspecified: Secondary | ICD-10-CM | POA: Diagnosis present

## 2013-03-24 DIAGNOSIS — K7030 Alcoholic cirrhosis of liver without ascites: Secondary | ICD-10-CM | POA: Diagnosis present

## 2013-03-24 DIAGNOSIS — G40909 Epilepsy, unspecified, not intractable, without status epilepticus: Secondary | ICD-10-CM | POA: Diagnosis present

## 2013-03-24 DIAGNOSIS — R680 Hypothermia, not associated with low environmental temperature: Secondary | ICD-10-CM | POA: Diagnosis present

## 2013-03-24 DIAGNOSIS — D649 Anemia, unspecified: Secondary | ICD-10-CM | POA: Diagnosis present

## 2013-03-24 DIAGNOSIS — D61818 Other pancytopenia: Secondary | ICD-10-CM

## 2013-03-24 DIAGNOSIS — Z87891 Personal history of nicotine dependence: Secondary | ICD-10-CM

## 2013-03-24 DIAGNOSIS — R569 Unspecified convulsions: Secondary | ICD-10-CM

## 2013-03-24 DIAGNOSIS — F1010 Alcohol abuse, uncomplicated: Secondary | ICD-10-CM | POA: Diagnosis present

## 2013-03-24 DIAGNOSIS — K7290 Hepatic failure, unspecified without coma: Secondary | ICD-10-CM

## 2013-03-24 DIAGNOSIS — R001 Bradycardia, unspecified: Secondary | ICD-10-CM

## 2013-03-24 DIAGNOSIS — I8500 Esophageal varices without bleeding: Secondary | ICD-10-CM

## 2013-03-24 DIAGNOSIS — D696 Thrombocytopenia, unspecified: Secondary | ICD-10-CM | POA: Diagnosis present

## 2013-03-24 DIAGNOSIS — E8809 Other disorders of plasma-protein metabolism, not elsewhere classified: Secondary | ICD-10-CM | POA: Diagnosis present

## 2013-03-24 DIAGNOSIS — K754 Autoimmune hepatitis: Secondary | ICD-10-CM | POA: Diagnosis present

## 2013-03-24 LAB — URINALYSIS, ROUTINE W REFLEX MICROSCOPIC
Bilirubin Urine: NEGATIVE
Ketones, ur: NEGATIVE mg/dL
Leukocytes, UA: NEGATIVE
Nitrite: NEGATIVE
Urobilinogen, UA: 0.2 mg/dL (ref 0.0–1.0)
pH: 7 (ref 5.0–8.0)

## 2013-03-24 LAB — COMPREHENSIVE METABOLIC PANEL
Alkaline Phosphatase: 495 U/L — ABNORMAL HIGH (ref 39–117)
BUN: 12 mg/dL (ref 6–23)
CO2: 23 mEq/L (ref 19–32)
Chloride: 103 mEq/L (ref 96–112)
Creatinine, Ser: 0.64 mg/dL (ref 0.50–1.10)
GFR calc non Af Amer: >90 mL/min (ref >90)
Glucose, Bld: 158 mg/dL — ABNORMAL HIGH (ref 70–99)
Potassium: 4.8 mEq/L (ref 3.5–5.1)
Total Bilirubin: 9 mg/dL — ABNORMAL HIGH (ref 0.3–1.2)

## 2013-03-24 LAB — POCT I-STAT, CHEM 8
Chloride: 108 mEq/L (ref 96–112)
Creatinine, Ser: 0.7 mg/dL (ref 0.50–1.10)
Glucose, Bld: 177 mg/dL — ABNORMAL HIGH (ref 70–99)
HCT: 36 % (ref 36.0–46.0)
Hemoglobin: 12.2 g/dL (ref 12.0–15.0)
Potassium: 5 mEq/L (ref 3.5–5.1)
Potassium: 5.1 mEq/L (ref 3.5–5.1)
Sodium: 139 mEq/L (ref 135–145)
TCO2: 23 mmol/L (ref 0–100)

## 2013-03-24 LAB — RAPID URINE DRUG SCREEN, HOSP PERFORMED
Barbiturates: NOT DETECTED
Tetrahydrocannabinol: NOT DETECTED

## 2013-03-24 LAB — POCT PREGNANCY, URINE: Preg Test, Ur: NEGATIVE

## 2013-03-24 LAB — CBC WITH DIFFERENTIAL/PLATELET
Basophils Absolute: 0 10*3/uL (ref 0.0–0.1)
Eosinophils Absolute: 0.1 10*3/uL (ref 0.0–0.7)
Eosinophils Relative: 1 % (ref 0–5)
Lymphocytes Relative: 9 % — ABNORMAL LOW (ref 12–46)
Lymphs Abs: 0.5 10*3/uL — ABNORMAL LOW (ref 0.7–4.0)
MCV: 96.1 fL (ref 78.0–100.0)
Neutrophils Relative %: 85 % — ABNORMAL HIGH (ref 43–77)
Platelets: 66 10*3/uL — ABNORMAL LOW (ref 150–400)
RBC: 3.57 MIL/uL — ABNORMAL LOW (ref 3.87–5.11)
RDW: 15.3 % (ref 11.5–15.5)
WBC: 5.2 10*3/uL (ref 4.0–10.5)

## 2013-03-24 LAB — POCT I-STAT 3, ART BLOOD GAS (G3+)
O2 Saturation: 100 %
TCO2: 26 mmol/L (ref 0–100)
pCO2 arterial: 41.4 mmHg (ref 35.0–45.0)

## 2013-03-24 LAB — URINE MICROSCOPIC-ADD ON

## 2013-03-24 LAB — GLUCOSE, CAPILLARY: Glucose-Capillary: 151 mg/dL — ABNORMAL HIGH (ref 70–99)

## 2013-03-24 LAB — CG4 I-STAT (LACTIC ACID): Lactic Acid, Venous: 1.39 mmol/L (ref 0.5–2.2)

## 2013-03-24 LAB — AMMONIA: Ammonia: 88 umol/L — ABNORMAL HIGH (ref 11–60)

## 2013-03-24 LAB — LACTIC ACID, PLASMA: Lactic Acid, Venous: 1.8 mmol/L (ref 0.5–2.2)

## 2013-03-24 MED ORDER — SODIUM CHLORIDE 0.9 % IV BOLUS (SEPSIS)
1000.0000 mL | Freq: Once | INTRAVENOUS | Status: AC
  Administered 2013-03-24: 1000 mL via INTRAVENOUS

## 2013-03-24 MED ORDER — LORAZEPAM 2 MG/ML IJ SOLN
INTRAMUSCULAR | Status: AC
  Administered 2013-03-24: 1 mg
  Filled 2013-03-24: qty 1

## 2013-03-24 MED ORDER — ATROPINE SULFATE 1 MG/ML IJ SOLN
INTRAMUSCULAR | Status: AC
  Filled 2013-03-24: qty 1

## 2013-03-24 NOTE — H&P (Signed)
Family Medicine Teaching [facility] Admission History and Physical Service Pager: [phone]  Patient name: [name] Medical record number: [id number] Date of birth: 10-07-1985 Age: 27 y.o. Gender: female  Primary Care Provider: Levert Feinstein, MD Consultants: none to date Code Status: full  Chief Complaint: "unresponsiveness"/LOC and seizures  Assessment and Plan: [name] is a 27 y.o. year old female presenting with loss of consciousness/EMS-witnessed seizures after being found at home confused/with diminished responsiveness. PMH significant for seizure disorder, chronic autoimmune hepatitis with resultant cirrhosis and chronic thrombocytopenia, chronic leg swelling, and hx of alcohol and cocaine abuse. Found in the ED to be bradycardic to ~60, initially afebrile but with HYPOthermia once arriving to floor (unable to read temp on Dynamap). Labs generally unremarkable though with chronic LFT elevation and thrombocytopenia, with microscopic hematuria.  #Acute encephalopathy / seizures - uncertain cause with multiple possible precipitating factors -no current seizure activity and pt is well-oriented though with some ?baseline psychomotor retardation -possible contributions include hypoglycemia, hypothermia/sepsis/infection, hyperammonemia, breakthrough seizure -no prior history of diabetes, uncertain cause of reported hypoglycemia -continue home Keppra and monitor closely -seizure precautions, regular neuro checks -CBG's q4 for now [ ]  f/u TSH, A1c [ ]  consider neuro and/or psychiatry consults  #Presumed sepsis with uncertain source - bradycardia ~60, hypothermia (temp unreadable once reaching floor) -possible contribution to seizure activity, as above -possible UTI, given hematuria on dipstick, but otherwise no LE/nitrites; no elevated WBC, normal CXR -strongly doubt meningitis given appearance, no meningitic signs, no rash -gentle hydration given liver disease and LE  swelling, hold home beta blocker -empiric ceftriaxone to start once blood and urine cultures collected (initial urine not sent for culture) -warming blanket with target temp 97-99 F [ ]  monitor temperatures closely [ ]  f/u AM labs and EKG [ ]  f/u cultures  #Autoimmune hepatitis with cirrhosis/portal hypertension - recent outpt labs with MELD score 19 within the last week -possible contribution to current condition, if hypoglycemia contributing to seizures (?poor glycogen stores) -likely contribution to chronic LE swelling and current mild hyperammonemia -continue home spironolactone, Lasix 80 mg BID, folic acid, thiamine; holding nadolol due to bradycardia [ ]  f/u daily weights, I&O's [ ]  f/u BNP, INR, AM labs [ ]  consider repeat ammonia check  #Chronic anemia - likely multifactorial given other medical conditions -Hb higher currently at 11.9 than baseline 8-10; suspect degree of dehydration/hemoconcentration [ ]  f/u repeat CBC [ ]  consider anemia panel  #Substance abuse - admitted cocaine use one week ago when confronted with positive UDS [ ]  f/u social work consult  FEN/GI: hepatic diet, fluids NS at 75 mL/h Prophylaxis: SCD's due to thrombocytopenia  Disposition: admitted to telemetry bed, attending Dr. McDiarmid, with management as above  -discharge planning pending resolution of acute issues and clinical improvment  History of Present Illness: [name] is a 27 y.o. year old female presenting with loss of consciousness/EMS-witnessed seizures after being found at home confused/with diminished responsiveness. PMH significant for seizure disorder, chronic autoimmune hepatitis with resultant cirrhosis and chronic thrombocytopenia, chronic leg swelling, and hx of alcohol and cocaine abuse. Pt and family member report that pt had a shaking episode believed to be seizure around 8 PM 8/16; family member heard her yell for help and found her on the toilet. Pt had immediately prior to  this lost continence of urine on herself in bed and was confused/apparently post-ictal. EMS was called and pt's blood sugar was found to be in the 50's, and EMS witnessed two brief seizures. Pt was  brought to the ED and found to be afebrile but bradycardic. Pt was bolused with IV fluids, and found to have elevated ammonia and microscopic hematuria, but otherwise with grossly normal findings.  Pt reports feeling cold now and is very hungry, and complains of discomfort with her foley, but generally has felt well other than "being tired a lot" (which is not new, present for "months probably," per family) prior to this episode of seizure/LOC. Denies recent fevers/chills, N/V/D, chest pain, shortness of breath, cough, change in bowel/bladder habits. No recent sick contacts. Pt has had chronic LE swelling she thinks worse recently and was seen at her PCP's office about 3 days ago; pt was stopped on azathioprine and instructed to eat less salt, reduce Gatorate consumption, etc. Pt reports compliance with her medications at home.  Review Of Systems: Per HPI. Otherwise 12 point review of systems was performed and was unremarkable.  Patient Active Problem List   Diagnosis Date Noted  . Cocaine abuse 03/24/2013  . Acute encephalopathy 03/24/2013  . Hyperammonemia 03/24/2013  . Other pancytopenia 12/26/2012  . Alcohol abuse 12/26/2012  . Abdominal  pain, other specified site 12/26/2012  . Toxic effect of ethanol 12/16/2012  . Abdominal pain, chronic, right upper quadrant 11/27/2012  . Anemia 10/02/2012  . Bilateral lower abdominal pain 07/23/2012  . Autoimmune hepatitis 06/22/2012  . Ascites 06/22/2012  . 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 Nausea and vomiting in adult 06/21/2011  . Hyponatremia 06/21/2011  . Edema leg 05/26/2011  . Seizure disorder 03/26/2011  . ENCEPHALOPATHY-HEPATIC 09/18/2009  . Portal hypertension 09/18/2009  . INTRACRANIAL HEMORRHAGE 12/05/2007  . OTHER SPECIFIED DISEASE OF  HAIR&HAIR FOLLICLES 12/05/2007  . AMENORRHEA 06/29/2007  . THROMBOCYTOPENIA 10/06/2006  . TOBACCO DEPENDENCE 10/06/2006  . ESOPHAGEAL VARICES 10/06/2006  . HEPATIC CIRRHOSIS, NONALCOHOLIC 10/06/2006  . LIVER FAILURE 10/06/2006  . PAPANICOLAOU SMEAR, ABNORMAL 10/06/2006   Past Medical History: Past Medical History  Diagnosis Date  . Substance abuse April 2013    Cocaine plus THC usage - delisted from liver transplant list.   . Esophageal varices     distal esophageal grade 1 varices, portal gastropathy on EGDs in 01/2010 and 07/2012:  no banding or other intervention undertaken on EGDs from 2009 - 07/2012.    [name] Cirrhosis of liver   . Autoimmune hepatitis     Confirmed via liver biopsy 2002  . PSC (primary sclerosing cholangitis)   . Seizures     onset in 2009 associated with vent requiring resp failure.   . Thrombocytopenia     associated with cirrhosis.  followed by Dr Alcide Evener  . Intracranial hemorrhage 2009    in association with severe thrombocytopenia   Past Surgical History: Past Surgical History  Procedure Laterality Date  . Esophagogastroduodenoscopy  07/18/2012    Procedure: ESOPHAGOGASTRODUODENOSCOPY (EGD);  Surgeon: Louis Meckel, MD;  Location: [facility];  Service: Endoscopy;  Laterality: N/A;  . Gastric varices banding  07/18/2012    Procedure: GASTRIC VARICES BANDING;  Surgeon: Louis Meckel, MD;  Location: [facility] ENDOSCOPY;  Service: Endoscopy;  Laterality: N/A;  . Ercp  01/2010    Sclerosing Cholangitis with no dominant stricures    Social History: History  Substance Use Topics  . Smoking status: Former Smoker    Types: Cigarettes    Quit date: 08/09/2005  . Smokeless tobacco: Never Used  . Alcohol Use: No   Additional social history: Admits to cocaine use ~1 week ago. Please also refer to relevant sections of EMR.  Family History: Family History  Problem Relation Age of Onset  . Cancer Neg Hx    Allergies and Medications: Allergies  Allergen  Reactions  . Ibuprofen Shortness Of Breath   No current facility-administered medications on file prior to encounter.   Current Outpatient Prescriptions on File Prior to Encounter  Medication Sig Dispense Refill  . EPINEPHrine (EPI-PEN) 0.3 mg/0.3 mL DEVI Inject 0.3 mg into the muscle as needed (for allergic reaction).       . folic acid (FOLVITE) 1 MG tablet Take 1 tablet (1 mg total) by mouth daily.  60 tablet  1  . furosemide (LASIX) 40 MG tablet Take 80 mg by mouth 2 (two) times daily.       . hydrOXYzine (ATARAX/VISTARIL) 25 MG tablet Take 1 tablet (25 mg total) by mouth every 6 (six) hours as needed for itching.  21 tablet  0  . lactulose (CHRONULAC) 10 GM/15ML solution Take 20 g by mouth daily as needed (for constipation).      [name] levETIRAcetam (KEPPRA) 750 MG tablet Take 750 mg by mouth every 12 (twelve) hours.      . nadolol (CORGARD) 20 MG tablet Take 20 mg by mouth at bedtime.       [name] oxyCODONE-acetaminophen (PERCOCET/ROXICET) 5-325 MG per tablet Take 1 tablet by mouth every 4 (four) hours as needed for pain.  10 tablet  0  . pantoprazole (PROTONIX) 40 MG tablet Take 40 mg by mouth daily.       . potassium chloride SA (K-DUR,KLOR-CON) 20 MEQ tablet Take 1 tablet (20 mEq total) by mouth 3 (three) times daily.  90 tablet  0  . promethazine (PHENERGAN) 25 MG suppository Place 25 mg rectally every 6 (six) hours as needed for nausea.       . promethazine (PHENERGAN) 25 MG tablet Take 1 tablet (25 mg total) by mouth every 6 (six) hours as needed for nausea.  30 tablet  0  . spironolactone (ALDACTONE) 100 MG tablet Take 100 mg by mouth daily.      . sucralfate (CARAFATE) 1 GM/10ML suspension Take 1 g by mouth 2 (two) times daily as needed (for stomach pain).      [name] thiamine 100 MG tablet Take 1 tablet (100 mg total) by mouth daily.  120 tablet  3    Objective: BP 105/60  Pulse 55  Temp(Src) 98.1 F (36.7 C)  Resp 12  Ht 5\' 3"  (1.6 m)  Wt 170 lb (77.111 kg)  BMI 30.12 kg/m2  SpO2  100% NOTE: INITIAL TEMP IN THE ED 98.1, UNABLE TO READ ON DYNAMAP ONCE ARRIVED TO FLOOR Exam: General: adult female does not appear in distress but is cold subjectively and objectively  Repeatedly complains of feeling cold and being uncomfortable with foley in place and being hungry HEENT: [place]/AT, MMM, sclerae mildly icteric but not injected, PERRLA Cardiovascular: clinical bradycardia (rate ~60), but rhythm regular and no murmur appreciated Respiratory: CTAB, no wheezes, normal WOB on 2L  Abdomen: soft, nontender, BS+, not frankly distended Extremities: distal pulses intact, extremities cool but appear well-perfused  LE edema noted, 3+ from feet up to just below knees bilaterally/symmetric Skin: very cool to the touch, no rashes or breakdown noted Neuro: grossly nonfocal; pt alert/oriented x4, some psychomotor retardation  EOMI, sensation intact, strength equal/intact in bilateral upper and lower extremities  Labs and Imaging: CBC BMET   Recent Labs Lab 03/24/13 2030  03/24/13 2138  WBC 5.2  --   --  HGB 11.9*  < > 10.9*  HCT 34.3*  < > 32.0*  PLT 66*  --   --   < > = values in this interval not displayed.  Recent Labs Lab 03/24/13 2030  03/24/13 2138  NA 134*  < > 140  K 4.8  < > 5.1  CL 103  < > 108  CO2 23  --   --   BUN 12  < > 12  CREATININE 0.64  < > 0.70  GLUCOSE 158*  < > 177*  CALCIUM 8.5  --   --   < > = values in this interval not displayed.   Ammonia 8/16: 88 (high) Lactic acid 1.8 ABG: pO2 235 (high), on supplemental O2; otherwise unremarkable Serum EtOH: <11  ED UA: moderate Hb, hyaline casts, otherwise unremarkable UDS: positive for cocaine Urine preg: negative  Micro:  Blood cultures: TO BE DRAWN  Urine culture: TO BE COLLECTED  CXR 8/16: no frank edema, consolidation/infiltration  Bobbye Morton, MD 03/24/2013, 10:58 PM PGY-2, [facility] FPTS Intern pager: [phone], text pages welcome

## 2013-03-24 NOTE — ED Notes (Addendum)
2025  Pt arrives to the ER from home from a sick call.  Pt was unresponsive upon arrival of EMS.  BS was 49 on scene and was given 10% dextrose and oral glucose and upon arrival to hospital BS was 194.  Pt is very lethargic upon arrival to hospital.

## 2013-03-25 ENCOUNTER — Inpatient Hospital Stay (HOSPITAL_COMMUNITY): Payer: Medicare Other

## 2013-03-25 ENCOUNTER — Encounter (HOSPITAL_COMMUNITY): Payer: Self-pay | Admitting: Emergency Medicine

## 2013-03-25 DIAGNOSIS — K7290 Hepatic failure, unspecified without coma: Secondary | ICD-10-CM

## 2013-03-25 DIAGNOSIS — K766 Portal hypertension: Secondary | ICD-10-CM

## 2013-03-25 DIAGNOSIS — A419 Sepsis, unspecified organism: Secondary | ICD-10-CM | POA: Diagnosis present

## 2013-03-25 DIAGNOSIS — I498 Other specified cardiac arrhythmias: Secondary | ICD-10-CM

## 2013-03-25 DIAGNOSIS — F1410 Cocaine abuse, uncomplicated: Secondary | ICD-10-CM

## 2013-03-25 DIAGNOSIS — E162 Hypoglycemia, unspecified: Secondary | ICD-10-CM

## 2013-03-25 DIAGNOSIS — R109 Unspecified abdominal pain: Secondary | ICD-10-CM

## 2013-03-25 DIAGNOSIS — G9340 Encephalopathy, unspecified: Secondary | ICD-10-CM

## 2013-03-25 LAB — PROTIME-INR
INR: 1.91 — ABNORMAL HIGH (ref 0.00–1.49)
Prothrombin Time: 21.3 seconds — ABNORMAL HIGH (ref 11.6–15.2)

## 2013-03-25 LAB — HEMOGLOBIN A1C: Hgb A1c MFr Bld: 4.3 % (ref <5.7)

## 2013-03-25 LAB — CBC
HCT: 25.4 % — ABNORMAL LOW (ref 36.0–46.0)
Hemoglobin: 9 g/dL — ABNORMAL LOW (ref 12.0–15.0)
MCHC: 35.4 g/dL (ref 30.0–36.0)
RBC: 2.64 MIL/uL — ABNORMAL LOW (ref 3.87–5.11)
WBC: 2.8 10*3/uL — ABNORMAL LOW (ref 4.0–10.5)

## 2013-03-25 LAB — GLUCOSE, CAPILLARY
Glucose-Capillary: 111 mg/dL — ABNORMAL HIGH (ref 70–99)
Glucose-Capillary: 116 mg/dL — ABNORMAL HIGH (ref 70–99)

## 2013-03-25 LAB — MRSA PCR SCREENING: MRSA by PCR: POSITIVE — AB

## 2013-03-25 LAB — TSH: TSH: 0.726 u[IU]/mL (ref 0.350–4.500)

## 2013-03-25 LAB — PRO B NATRIURETIC PEPTIDE: Pro B Natriuretic peptide (BNP): 376.9 pg/mL — ABNORMAL HIGH (ref 0–125)

## 2013-03-25 MED ORDER — PANTOPRAZOLE SODIUM 40 MG PO TBEC
40.0000 mg | DELAYED_RELEASE_TABLET | Freq: Every day | ORAL | Status: DC
  Administered 2013-03-25 – 2013-03-27 (×3): 40 mg via ORAL
  Filled 2013-03-25 (×3): qty 1

## 2013-03-25 MED ORDER — SODIUM CHLORIDE 0.9 % IV SOLN
INTRAVENOUS | Status: DC
  Administered 2013-03-25: 1000 mL via INTRAVENOUS

## 2013-03-25 MED ORDER — ACETAMINOPHEN 325 MG PO TABS: 650.0000 mg | ORAL_TABLET | Freq: Four times a day (QID) | ORAL | Status: DC | PRN

## 2013-03-25 MED ORDER — ACETAMINOPHEN 650 MG RE SUPP: 650.0000 mg | Freq: Four times a day (QID) | RECTAL | Status: DC | PRN

## 2013-03-25 MED ORDER — FUROSEMIDE 80 MG PO TABS
80.0000 mg | ORAL_TABLET | Freq: Two times a day (BID) | ORAL | Status: DC
  Administered 2013-03-25 – 2013-03-27 (×5): 80 mg via ORAL
  Filled 2013-03-25 (×6): qty 1

## 2013-03-25 MED ORDER — VITAMIN B-1 100 MG PO TABS
100.0000 mg | ORAL_TABLET | Freq: Every day | ORAL | Status: DC
  Administered 2013-03-25 – 2013-03-27 (×3): 100 mg via ORAL
  Filled 2013-03-25 (×3): qty 1

## 2013-03-25 MED ORDER — MUPIROCIN 2 % EX OINT: TOPICAL_OINTMENT | Freq: Two times a day (BID) | CUTANEOUS | Status: DC

## 2013-03-25 MED ORDER — LACTULOSE 10 GM/15ML PO SOLN
20.0000 g | Freq: Every day | ORAL | Status: DC
  Administered 2013-03-26 – 2013-03-27 (×2): 20 g via ORAL
  Filled 2013-03-25 (×3): qty 30

## 2013-03-25 MED ORDER — FOLIC ACID 1 MG PO TABS
1.0000 mg | ORAL_TABLET | Freq: Every day | ORAL | Status: DC
  Administered 2013-03-25 – 2013-03-27 (×3): 1 mg via ORAL
  Filled 2013-03-25 (×3): qty 1

## 2013-03-25 MED ORDER — OXYCODONE HCL 5 MG PO TABS
5.0000 mg | ORAL_TABLET | ORAL | Status: DC | PRN
  Administered 2013-03-25 – 2013-03-26 (×7): 5 mg via ORAL
  Filled 2013-03-25 (×7): qty 1

## 2013-03-25 MED ORDER — LEVETIRACETAM 750 MG PO TABS
750.0000 mg | ORAL_TABLET | Freq: Two times a day (BID) | ORAL | Status: DC
  Administered 2013-03-25 – 2013-03-27 (×6): 750 mg via ORAL
  Filled 2013-03-25 (×7): qty 1

## 2013-03-25 MED ORDER — CHLORHEXIDINE GLUCONATE CLOTH 2 % EX PADS
6.0000 | MEDICATED_PAD | Freq: Every day | CUTANEOUS | Status: DC
  Administered 2013-03-27: 6 via TOPICAL

## 2013-03-25 MED ORDER — SPIRONOLACTONE 100 MG PO TABS
100.0000 mg | ORAL_TABLET | Freq: Every day | ORAL | Status: DC
  Administered 2013-03-25 – 2013-03-27 (×3): 100 mg via ORAL
  Filled 2013-03-25 (×3): qty 1

## 2013-03-25 MED ORDER — MUPIROCIN 2 % EX OINT
1.0000 "application " | TOPICAL_OINTMENT | Freq: Two times a day (BID) | CUTANEOUS | Status: DC
  Administered 2013-03-25 – 2013-03-27 (×4): 1 via NASAL
  Filled 2013-03-25: qty 22

## 2013-03-25 MED ORDER — ONDANSETRON HCL 4 MG PO TABS: 4.0000 mg | ORAL_TABLET | Freq: Four times a day (QID) | ORAL | Status: DC | PRN

## 2013-03-25 MED ORDER — DEXTROSE 5 % IV SOLN
1.0000 g | INTRAVENOUS | Status: DC
  Administered 2013-03-25 – 2013-03-26 (×2): 1 g via INTRAVENOUS
  Filled 2013-03-25 (×3): qty 10

## 2013-03-25 MED ORDER — ONDANSETRON HCL 4 MG/2ML IJ SOLN: 4.0000 mg | Freq: Four times a day (QID) | INTRAMUSCULAR | Status: DC | PRN

## 2013-03-25 MED ORDER — DIPHENHYDRAMINE HCL 25 MG PO CAPS
25.0000 mg | ORAL_CAPSULE | ORAL | Status: DC | PRN
  Administered 2013-03-25 – 2013-03-26 (×3): 25 mg via ORAL
  Filled 2013-03-25 (×3): qty 1

## 2013-03-25 MED ORDER — SODIUM CHLORIDE 0.9 % IJ SOLN
3.0000 mL | Freq: Two times a day (BID) | INTRAMUSCULAR | Status: DC
  Administered 2013-03-25 (×3): 3 mL via INTRAVENOUS

## 2013-03-25 MED ORDER — POTASSIUM CHLORIDE CRYS ER 20 MEQ PO TBCR
20.0000 meq | EXTENDED_RELEASE_TABLET | Freq: Three times a day (TID) | ORAL | Status: DC
  Administered 2013-03-25 – 2013-03-27 (×7): 20 meq via ORAL
  Filled 2013-03-25 (×9): qty 1

## 2013-03-25 NOTE — Progress Notes (Addendum)
Called ED at 2309 for report, called back at 0006 for reason why pt still in ED. Stated pt was now on her way to unit at 0008. Pt arrived 0020. Admitting pt now. Will continue to monitor.

## 2013-03-25 NOTE — Progress Notes (Signed)
Warming blanket dc'ed at 1000 per MD order.  Will monitor temperature closely.  Sweet Jarvis, [facility]

## 2013-03-25 NOTE — ED Provider Notes (Signed)
I saw and evaluated the patient, reviewed the resident's note and I agree with the findings and plan.  Patient presented by EMS s/p seizure. Hx of seizure.  Reports of bradycardia and hypoglycemia which is new.  Patient lethargic/post-ictal but nonfocal on exam.  Hypoglycemia may be related to known liver disease.  Patient will be admitted for observation.  Shon Baton, MD 03/25/13 [id number]

## 2013-03-25 NOTE — Progress Notes (Signed)
I have seen and examined this patient. I have discussed with Dr Booth.  I agree with their findings and plans as documented in their progress note.   

## 2013-03-25 NOTE — ED Provider Notes (Signed)
CSN: [id number]     Arrival date & time 03/24/13  2024 History     First MD Initiated Contact with Patient 03/24/13 2026     Chief Complaint  Patient presents with  . Loss of Consciousness   (Consider location/radiation/quality/duration/timing/severity/associated sxs/prior Treatment) Patient is a 27 y.o. female presenting with syncope. The history is provided by the patient and the EMS personnel.  Loss of Consciousness Episode history:  Multiple Most recent episode:  Today Duration:  30 seconds Timing:  Intermittent Progression:  Unchanged Chronicity:  New Witnessed: yes   Relieved by: ativan. Worsened by:  Nothing tried Ineffective treatments:  None tried Associated symptoms: confusion, nausea and seizures   Associated symptoms: no chest pain, no diaphoresis, no dizziness, no fever, no headaches, no palpitations, no shortness of breath and no vomiting     Past Medical History  Diagnosis Date  . Substance abuse April 2013    Cocaine plus THC usage - delisted from liver transplant list.   . Esophageal varices     distal esophageal grade 1 varices, portal gastropathy on EGDs in 01/2010 and 07/2012:  no banding or other intervention undertaken on EGDs from 2009 - 07/2012.    [name] Cirrhosis of liver   . Autoimmune hepatitis     Confirmed via liver biopsy 2002  . PSC (primary sclerosing cholangitis)   . Seizures     onset in 2009 associated with vent requiring resp failure.   . Thrombocytopenia     associated with cirrhosis.  followed by Dr Alcide Evener  . Intracranial hemorrhage 2009    in association with severe thrombocytopenia   Past Surgical History  Procedure Laterality Date  . Esophagogastroduodenoscopy  07/18/2012    Procedure: ESOPHAGOGASTRODUODENOSCOPY (EGD);  Surgeon: Louis Meckel, MD;  Location: [facility];  Service: Endoscopy;  Laterality: N/A;  . Gastric varices banding  07/18/2012    Procedure: GASTRIC VARICES BANDING;  Surgeon: Louis Meckel, MD;  Location:  [facility];  Service: Endoscopy;  Laterality: N/A;  . Ercp  01/2010    Sclerosing Cholangitis with no dominant stricures    Family History  Problem Relation Age of Onset  . Cancer Neg Hx    History  Substance Use Topics  . Smoking status: Former Smoker    Types: Cigarettes    Quit date: 08/09/2005  . Smokeless tobacco: Never Used  . Alcohol Use: No   OB History   Grav Para Term Preterm Abortions TAB SAB Ect Mult Living                 Review of Systems  Constitutional: Positive for appetite change. Negative for fever, chills, diaphoresis and fatigue.  HENT: Negative for ear pain, congestion, sore throat, facial swelling, mouth sores, trouble swallowing, neck pain and neck stiffness.   Eyes: Negative.   Respiratory: Negative for apnea, cough, chest tightness, shortness of breath and wheezing.   Cardiovascular: Positive for syncope. Negative for chest pain, palpitations and leg swelling.  Gastrointestinal: Positive for nausea. Negative for vomiting, abdominal pain, diarrhea and abdominal distention.  Genitourinary: Negative for hematuria, flank pain, vaginal discharge, difficulty urinating and menstrual problem.  Musculoskeletal: Negative for back pain and gait problem.  Skin: Negative for rash and wound.  Neurological: Positive for seizures. Negative for dizziness, tremors, syncope, facial asymmetry, numbness and headaches.  Psychiatric/Behavioral: Positive for confusion.  All other systems reviewed and are negative.    Allergies  Ibuprofen  Home Medications   Current Outpatient Rx  Name  Route  Sig  Dispense  Refill  . EPINEPHrine (EPI-PEN) 0.3 mg/0.3 mL DEVI   Intramuscular   Inject 0.3 mg into the muscle as needed (for allergic reaction).          . folic acid (FOLVITE) 1 MG tablet   Oral   Take 1 tablet (1 mg total) by mouth daily.   60 tablet   1   . furosemide (LASIX) 40 MG tablet   Oral   Take 80 mg by mouth 2 (two) times daily.          .  hydrOXYzine (ATARAX/VISTARIL) 25 MG tablet   Oral   Take 1 tablet (25 mg total) by mouth every 6 (six) hours as needed for itching.   21 tablet   0   . lactulose (CHRONULAC) 10 GM/15ML solution   Oral   Take 20 g by mouth daily as needed (for constipation).         [name] levETIRAcetam (KEPPRA) 750 MG tablet   Oral   Take 750 mg by mouth every 12 (twelve) hours.         . nadolol (CORGARD) 20 MG tablet   Oral   Take 20 mg by mouth at bedtime.          [name] oxyCODONE-acetaminophen (PERCOCET/ROXICET) 5-325 MG per tablet   Oral   Take 1 tablet by mouth every 4 (four) hours as needed for pain.   10 tablet   0   . pantoprazole (PROTONIX) 40 MG tablet   Oral   Take 40 mg by mouth daily.          . potassium chloride SA (K-DUR,KLOR-CON) 20 MEQ tablet   Oral   Take 1 tablet (20 mEq total) by mouth 3 (three) times daily.   90 tablet   0   . promethazine (PHENERGAN) 25 MG suppository   Rectal   Place 25 mg rectally every 6 (six) hours as needed for nausea.          . promethazine (PHENERGAN) 25 MG tablet   Oral   Take 1 tablet (25 mg total) by mouth every 6 (six) hours as needed for nausea.   30 tablet   0   . spironolactone (ALDACTONE) 100 MG tablet   Oral   Take 100 mg by mouth daily.         . sucralfate (CARAFATE) 1 GM/10ML suspension   Oral   Take 1 g by mouth 2 (two) times daily as needed (for stomach pain).         [name] thiamine 100 MG tablet   Oral   Take 1 tablet (100 mg total) by mouth daily.   120 tablet   3    BP 103/58  Pulse 55  Temp(Src) 98.1 F (36.7 C)  Resp 10  Ht 5\' 3"  (1.6 m)  Wt 170 lb (77.111 kg)  BMI 30.12 kg/m2  SpO2 100% Physical Exam  Nursing note and vitals reviewed. Constitutional: She appears well-developed and well-nourished. She appears listless. No distress.  HENT:  Head: Normocephalic and atraumatic.  Right Ear: External ear normal.  Left Ear: External ear normal.  Nose: Nose normal.  Mouth/Throat: Oropharynx is  clear and moist. No oropharyngeal exudate.  Eyes: Conjunctivae and EOM are normal. Pupils are equal, round, and reactive to light. Right eye exhibits no discharge. Left eye exhibits no discharge. Scleral icterus is present.  Neck: Normal range of motion. Neck supple. No JVD present. No tracheal deviation present. No thyromegaly present.  Cardiovascular: Normal rate, regular rhythm, normal heart sounds and intact distal pulses.  Exam reveals no gallop and no friction rub.   No murmur heard. Pulmonary/Chest: Effort normal and breath sounds normal. No respiratory distress. She has no wheezes. She has no rales. She exhibits no tenderness.  Abdominal: Soft. Bowel sounds are normal. She exhibits no distension. There is hepatomegaly. There is no tenderness. There is no rebound and no guarding.  Musculoskeletal: Normal range of motion.  Lymphadenopathy:    She has no cervical adenopathy.  Neurological: She appears listless. No cranial nerve deficit. Coordination normal. GCS eye subscore is 4. GCS verbal subscore is 5. GCS motor subscore is 6.  Patient seems drowsy. She is able to move all of her extremities. She knows who she is and the date but does not know where she is.   Skin: Skin is warm. No rash noted. She is not diaphoretic.  Psychiatric: She has a normal mood and affect. Her behavior is normal. Judgment and thought content normal.    ED Course   Procedures (including critical care time)  Labs Reviewed  COMPREHENSIVE METABOLIC PANEL - Abnormal; Notable for the following:    Sodium 134 (*)    Glucose, Bld 158 (*)    Albumin 1.8 (*)    AST 115 (*)    ALT 62 (*)    Alkaline Phosphatase 495 (*)    Total Bilirubin 9.0 (*)    All other components within normal limits  CBC WITH DIFFERENTIAL - Abnormal; Notable for the following:    RBC 3.57 (*)    Hemoglobin 11.9 (*)    HCT 34.3 (*)    Platelets 66 (*)    Neutrophils Relative % 85 (*)    Lymphocytes Relative 9 (*)    Lymphs Abs 0.5 (*)     All other components within normal limits  URINALYSIS, ROUTINE W REFLEX MICROSCOPIC - Abnormal; Notable for the following:    Hgb urine dipstick MODERATE (*)    All other components within normal limits  URINE RAPID DRUG SCREEN (HOSP PERFORMED) - Abnormal; Notable for the following:    Cocaine POSITIVE (*)    All other components within normal limits  AMMONIA - Abnormal; Notable for the following:    Ammonia 88 (*)    All other components within normal limits  GLUCOSE, CAPILLARY - Abnormal; Notable for the following:    Glucose-Capillary 151 (*)    All other components within normal limits  URINE MICROSCOPIC-ADD ON - Abnormal; Notable for the following:    Casts HYALINE CASTS (*)    All other components within normal limits  POCT I-STAT, CHEM 8 - Abnormal; Notable for the following:    Glucose, Bld 153 (*)    All other components within normal limits  POCT I-STAT 3, BLOOD GAS (G3+) - Abnormal; Notable for the following:    pO2, Arterial 235.0 (*)    Bicarbonate 24.7 (*)    All other components within normal limits  POCT I-STAT, CHEM 8 - Abnormal; Notable for the following:    Glucose, Bld 177 (*)    Hemoglobin 10.9 (*)    HCT 32.0 (*)    All other components within normal limits  LACTIC ACID, PLASMA  ETHANOL  CG4 I-STAT (LACTIC ACID)  POCT PREGNANCY, URINE   Dg Chest Portable 1 View  03/24/2013   *RADIOLOGY REPORT*  Clinical Data: Altered mental status  PORTABLE CHEST - 1 VIEW  Comparison: February 09, 2013  Findings: There is no  edema or consolidation.  Heart size and pulmonary vascularity are normal.  No adenopathy.  No bone lesions. No pneumothorax.  IMPRESSION: No edema or consolidation.   Original Report Authenticated By: Bretta Bang, M.D.   1. Cocaine abuse   2. Hyperammonemia   3. Seizure   4. Bradycardia   5. Hypoglycemia     MDM  27 yr old F patient with autoimmune hepatitis and PSC here with seizures, hypoglycemia and bradycardia. Patient does take a beta  blocker but says she did not take more of her medication that needed. Patient does have a hx of seizures. Patient says she has been compliant with her medications. Patient has a hx of drug abuse but denies to me using any, but has cocaine positive urine. Patient seems post-ictal on first evaluation. Patient with no fever or signs fo infection or nuchal rigidity to explain the seizures today. EMS says that the patient's mother called them when she came home and found the patient not responsive to her when she was sitting on the toilet. Patient then had to 30 second seizures seen by EMS. Patient now recovering and returning to baseline in the ED after ativan. Patient arrived with HR in 40's and it improved to 60's with fluid. Patient also initially with blood sugar in 40's that has improved with glucose given by EMS. Given the prolonged post-ictal state and recurring seizures with bradycardia and hypoglycemia, will admit to hospital for observation. Patient with baseline LFT's, baseline labs. CXR is normal.  Case discussed with Dr. Rob Bunting, MD 03/25/13 2130  Sherryl Manges, MD 03/25/13 [phone]

## 2013-03-25 NOTE — Progress Notes (Signed)
Family Medicine Teaching Service Daily Progress Note Intern Pager: [phone]  Patient name: [name] Medical record number: [id number] Date of birth: 1986-04-23 Age: 27 y.o. Gender: female  Primary Care Provider: Levert Feinstein, MD Consultants: none Code Status: Full  Pt Overview and Major Events to Date:  27 year old F with autoimmune hepatitis and seizure disorder presenting with seizures and found to be hypoglycemic and hypothermic.   Assessment and Plan:  #Acute encephalopathy / seizures - uncertain cause with multiple possible precipitating factors including hypoglycemia, medication non-compliance, infection, opiate withdrawal.  Elevated ammonia should be seizure protective. -no seizures since admission  -continue home Keppra and monitor closely  -seizure precautions, regular neuro checks  -CBG's q4 for now  [ ]  f/u TSH, A1c, keppra level [ ]  consider neuro and/or psychiatry consults   #Hypoglycemia - unknown cause.  Possibly related to poor PO intake.  No history of diabetes or insulin use. - monitor CBGs for now  #Hypothermia - likely related to hypoglycemia.  DDx includes sepsis/infection and thyroid dysfunction. - f/u TSH - f/u blood and urine cultures - f/u ultrasound to evaluate for ascities/SBP - continue empiric ceftriaxone until cultures negative x24 hours - temperature has been stable overnight - will remove continuous rectal thermometer and check q1hr x3  #Autoimmune hepatitis with cirrhosis/portal hypertension - recent outpt labs with MELD score 19 within the last week  - possible contribution to current condition, if hypoglycemia contributing to seizures (?poor glycogen stores)  - likely contribution to chronic LE swelling and current mild hyperammonemia  - continue home spironolactone, Lasix 80 mg BID, folic acid, thiamine; holding nadolol due to bradycardia  - consider rifaximin for hyperammonemia is she does not tolerate lactulose - consider  cholestyramine for itching if sarna lotion not enough [ ]  f/u daily weights, I&O's  [ ]  f/u BNP, INR, AM labs  [ ]  consider repeat ammonia check   #Chronic anemia - likely multifactorial given other medical conditions  - Hgb back to base line of around 9-10 after fluids  [ ]  consider anemia panel   #Substance abuse - admitted cocaine use one week ago when confronted with positive UDS  [ ]  f/u social work consult   FEN/GI: hepatic diet, fluids NS at 75 mL/h  Prophylaxis: SCD's due to thrombocytopenia  Disposition: discharge pending clinical improvement  Subjective: Reports being very tired this morning.  Not very cooperative with exam.  Reports feeling cold and diffuse body aches.  Objective: Temp:  [98 F (36.7 C)-98.1 F (36.7 C)] 98 F (36.7 C) (08/17 0600) Pulse Rate:  [53-98] 98 (08/17 0600) Resp:  [10-22] 16 (08/17 0600) BP: (102-126)/(50-73) 126/68 mmHg (08/17 0600) SpO2:  [100 %] 100 % (08/17 0600) Weight:  [170 lb (77.111 kg)-172 lb (78.019 kg)] 172 lb (78.019 kg) (08/17 0507) Physical Exam: General: sleeping but rouses to voice, NAD HEENT: sclera mildly icteric, MMM Cardiovascular: RRR, no murmurs Respiratory: CTAB no wheezes or rales Abdomen: +BS, soft, NT, no fluid wave appreciated Extremities: 2+ edema to knees; SCDs in place  Laboratory:  Recent Labs Lab 03/22/13 1416 03/24/13 2030 03/24/13 2039 03/24/13 2138 03/25/13 0525  WBC 2.7* 5.2  --   --  2.8*  HGB 9.6* 11.9* 12.2 10.9* 9.0*  HCT 27.7* 34.3* 36.0 32.0* 25.4*  PLT 51* 66*  --   --  39*    Recent Labs Lab 03/22/13 1416 03/24/13 2030 03/24/13 2039 03/24/13 2138  NA 136 134* 139 140  K 4.1 4.8 5.0 5.1  CL  108 103 106 108  CO2 26 23  --   --   BUN 11 12 12 12   CREATININE 0.63 0.64 0.70 0.70  CALCIUM 7.9* 8.5  --   --   PROT 5.9* 7.2  --   --   BILITOT 7.4* 9.0*  --   --   ALKPHOS 430* 495*  --   --   ALT 56* 62*  --   --   AST 110* 115*  --   --   GLUCOSE 93 158* 153* 177*   CBG  (last 3)   Recent Labs  03/25/13 0114 03/25/13 0408 03/25/13 0742  GLUCAP 125* 245* 111*   INR: 1.91  Pro BNP: 376.9 Ammonia 88 Lactic Acid: 1.8  Blood Cx: pending Urine Cx: pending  Imaging/Diagnostic Tests: CXR: negative  Phebe Colla, MD 03/25/2013, 8:13 AM PGY-3, [facility] FPTS Intern pager: [phone]

## 2013-03-25 NOTE — Progress Notes (Signed)
Pt's grandmother was bedside when I arrived. When asked if she needed anything she declined. Pt's g'mother said she did not know anything and had not spoken w/dr or nurse. Ckd w/sec; nurse was w/trauma. Told g'mother nurse would speak w/her as soon as he returns. Offer further assistance; she said no. Pt's g'mother was pleasant and pt. Did not disturb pt as she was resting. Marjory Lies Chaplain  03/24/13 1610  Clinical Encounter Type  Visited With Family

## 2013-03-25 NOTE — H&P (Signed)
I have seen and examined this patient. I have discussed with Dr Casper Harrison.  I agree with their findings and plans as documented in their admission note.  Acute Issues 1. Seizures, generalized, recurrent, witnessed - Pre-existing Seizure disorder on Keppra. - History of Intracranial hemorrhage from Low platelets. - Likely precipitant is patient's Hypoglycemic condition found on initial evaluation in patient's home by EMS - unknown adherence to Keppra regiment.  - - Other possible contributions from Keppra regiment non-adherence, infection (hypothermia), Opiate withdrawal (patient's UDS did not detect Opiates- Patient is prescribed Percocet as home medication)  2. Hypothermia - Likely precipitant was patient's hypoglycemia - Other possible causes: Infection, hypothyroidism, adrenal insufficiency.   3. Hypoglycemia - Likely contributing causes of poor per oral intake, poor glycogen stores from liver cirrhosis, cocaine-derivative abuse,  - Other possible causes: possible infection/sepsis, alcohol abuse, adrenal insufficiency  4. Peripheral Edema - Weight well above baseline ~150 to 155 lbs. - Pitting edema of legs - No overt Heart failure  5. Autoimmune Liver Cirrosis/PSC - Hyperbilirubinemia, hypoalbuminemia - Portal Hypertension with Portal Vein dilation and shunting with Renal vein.  - Low grade hepatic encephalopathy with psychomotor slowing. No asterixis.   Recommendations Agree with TSH, A1C Limited abdominal US to look for ascites to see if Spontaneous Bacterial Peritonitis is a possibility. Sodium restriction < 2 gram diet Avoid NSAIDS  Monitor use of opiates closely as well as her response Loop diuresis with oral Spironolacte in a 1 mg:4 mg ratio with close monitoring of Renal function to avoid Hepato-renal syndrome. Goal weight around 155 if patient can tolerate it.  If patient unwilling to take Lactulose for hepatic encephalopathy, can use Rifaximin 550 mg ORAL TWICE  DAILY. If itching from Cholestatic Pruritus remains problematic, consider use of cholestyramine 4 gram twice a day to start and titrate to effect or maximal dose.   Watch for drug-drug interactions or interference with other drug abdorptions.   Sarna menthol topical cream/lotion is also helpful for cholestatic pruritus.  Case Manger/Social Work Librarian, academic for Substance Abuse resources in community.  Stop Rocephin once cultures negative at 24 hours.  Will check Keppra level, not so much to guide therapy but more to see if patient is taking her Keppra

## 2013-03-25 NOTE — Progress Notes (Signed)
CRITICAL VALUE ALERT  Critical value received:  MRSA +  Date of notification:  03/25/13  Time of notification:  0438  Critical value read back: yes  Nurse who received alert:  Prestina Raigoza [place], RN  MD notified (1st page):  Dr. Casper Harrison  Time of first page:  [phone]  MD notified (2nd page):  Time of second page:  Responding MD:  Dr. Casper Harrison  Time MD responded:  [phone]

## 2013-03-26 ENCOUNTER — Other Ambulatory Visit: Payer: Self-pay | Admitting: *Deleted

## 2013-03-26 DIAGNOSIS — E871 Hypo-osmolality and hyponatremia: Secondary | ICD-10-CM

## 2013-03-26 DIAGNOSIS — D61818 Other pancytopenia: Secondary | ICD-10-CM

## 2013-03-26 DIAGNOSIS — R569 Unspecified convulsions: Secondary | ICD-10-CM

## 2013-03-26 DIAGNOSIS — I8500 Esophageal varices without bleeding: Secondary | ICD-10-CM

## 2013-03-26 LAB — GLUCOSE, CAPILLARY
Glucose-Capillary: 78 mg/dL (ref 70–99)
Glucose-Capillary: 108 mg/dL — ABNORMAL HIGH (ref 70–99)
Glucose-Capillary: 216 mg/dL — ABNORMAL HIGH (ref 70–99)

## 2013-03-26 LAB — URINE CULTURE: Colony Count: 60000

## 2013-03-26 MED ORDER — CHOLESTYRAMINE 4 G PO PACK
4.0000 g | PACK | Freq: Two times a day (BID) | ORAL | Status: DC
  Filled 2013-03-26 (×5): qty 1

## 2013-03-26 MED ORDER — CAMPHOR-MENTHOL 0.5-0.5 % EX LOTN
TOPICAL_LOTION | CUTANEOUS | Status: DC | PRN
  Filled 2013-03-26 (×2): qty 222

## 2013-03-26 MED ORDER — HYDROXYZINE HCL 25 MG PO TABS
50.0000 mg | ORAL_TABLET | Freq: Four times a day (QID) | ORAL | Status: DC | PRN
  Administered 2013-03-26 (×4): 50 mg via ORAL
  Filled 2013-03-26 (×4): qty 2
  Filled 2013-03-26: qty 1

## 2013-03-26 NOTE — Progress Notes (Signed)
Interim Progress note  Subjective: Called by nurse for patient complaining of itching and blurry near-sighted vision. Itching has been on-going problem, last dose of hydroxyzine did not help. Has not gotten benadryl since earlier this morning. She noticed the blurry vision around 5pm when she was trying to look at her cell phone. Can see the TV from the bed fine. Doesn't have any spots in her vision. Has not been sleeping well due to itching.  Objective: BP 139/92  Pulse 85  Temp(Src) 97 F (36.1 C) (Oral)  Resp 18  Ht 5\' 3"  (1.6 m)  Wt 171 lb 15.3 oz (78 kg)  BMI 30.47 kg/m2  SpO2 99%  CBG at 16:45 was 108  General: Patient ambulating in room, NAD HEENT: sclera icteric, PERRL, EOMI Cardiac: RRR normal S1/S2 Resp: CTAB with normal effort Neuro: CN: 2-4, 6-12 grossly intact. CN5: Does localize light touch to forehead on right and lower face on left. Strength grossly 4/5 upper and lower extremities bilaterally.  A/P: Itching: give her dose of benadryl now, if this does not help we can try zyrtec later this evening Vision: non-specific changes, likely secondary to fatigue. Neuro grossly normal. Re-evaluate in the morning

## 2013-03-26 NOTE — Progress Notes (Signed)
FMTS Attending Admission Note: Terri Mcbane,MD I  have seen and examined this patient, reviewed their chart. I have discussed this patient with the resident. I agree with the resident's findings, assessment and care plan.  

## 2013-03-26 NOTE — Clinical Social Work Note (Signed)
CSW received a consult for substance abuse. According to RN, pt denied any information regarding substance abuse treatment.   CSW signing off on pt. Please re-consult if any new concerns arise.  Darlyn Chamber, MSW, LCSWA Clinical Social Work [phone]

## 2013-03-26 NOTE — Progress Notes (Signed)
Family Medicine Teaching Service PCP Social Visit  Stopped by pt's room to introduce myself as her new PCP. She endorses worsening itching. I offered to pass this message along to the inpatient team caring for her so that they can address it.   Advised patient that I have received refill requests for her spironolactone since her admission to the hospital, and that I would prefer to have the inpatient team give her refills on her medicines upon discharge in the event that medicines and/or dosages are different when she leaves. Patient understands and will ask the FPTS team for needed refills prior to discharge.  Appreciate inpatient FTPS team's excellent care of my patient. Please page me if I can be of assistance.  Levert Feinstein, MD Family Medicine PGY-2

## 2013-03-26 NOTE — Progress Notes (Signed)
Family Medicine Teaching Service Daily Progress Note Intern Pager: [phone]  Patient name: [name] Medical record number: [id number] Date of birth: 1985/12/20 Age: 27 y.o. Gender: female  Primary Care Provider: Levert Feinstein, MD Consultants: none  Code Status: full code  Pt Overview and Major Events to Date: 27 year old F with autoimmune hepatitis and seizure disorder presenting with seizures and found to be hypoglycemic and hypothermic.  03/24/13: CXR, no edema or consolidation; Abdominal U/S- no ascites or acute findings: transaminitis AST>ALT, and total bili of 9.0  03/25/13: EKG- NSR with 1 PVC   Assessment and Plan: 28 year old female presents with loss of consciousness/EMS-witnessed seizures after being found at home by family members confused and decreased responsiveness. Patient presented with hypoglycemia, hypothermia, bradycardia, and EMS-witnessed seizures. PMH of seizure disorder, chronic auotimmune hepatitis, cirrhosis, chronic thrombocytopenia, chronic leg edema, history of alcohol and cocaine abuse.  #Acute encephalopathy / seizures, improved - uncertain cause with multiple possible precipitating factors including hypoglycemia, medication non-compliance, infection, opiate withdrawal. Elevated ammonia should be seizure protective.  -no seizures since admission  -continue home Keppra and monitor closely  -seizure precautions, regular neuro checks  -CBG's q4 for now  - HbA1c was 4.3, TSH was 0.726  #Neck Pain, NOS: negative Brudzinski and Kernig signs, tender to palpation on left cervical paraspinal muscles. Monitor clinically.   #Hypoglycemia, improved - unknown cause. Possibly related to poor PO intake. No history of diabetes or insulin use.  - monitor CBGs for now   #Hypothermia, improved - likely related to hypoglycemia. DDx includes sepsis/infection and thyroid dysfunction.  - thyroid stimulating hormone 0.726 - blood and urine cultures: pending day 1  growth - ultrasound to evaluate for ascities/SBP- negative, no ascites, no acute findings.   - continue empiric ceftriaxone until cultures negative x24 hours  - temperature has been stable overnight - continuous rectal thermometer removed.   #Autoimmune hepatitis with cirrhosis/portal hypertension - recent outpt labs with MELD score 19 within the last week  - likely contribution to chronic LE swelling and current mild hyperammonemia  - continue home spironolactone, Lasix 80 mg BID, folic acid, thiamine; holding nadolol due to bradycardia  - consider rifaximin for hyperammonemia is she does not tolerate lactulose  - consider cholestyramine for itching if sarna lotion not enough. Hydroxyzine has not been helping.  [ ]  f/u daily weights, I&O's   [ ]  consider repeat ammonia check   #Chronic anemia - likely multifactorial given other medical conditions  - Hgb back to base line of around 9-10 after fluids   #Substance abuse - admitted cocaine use one week ago when confronted with positive UDS  [ ]  f/u social work consult   FEN/GI: hepatic diet, fluids NS at 75 mL/h  Prophylaxis: SCD's due to thrombocytopenia   Disposition: discharge pending clinical improvement   Subjective: Reports having neck pain this morning which she has had for a few days, and also notes that her left leg is swelling and is uncomfortable. She also complains of diffuse itching and her home hydroxyzine and Benadryl has not helped. She denies any headaches or dizziness, chest pain or SOB. Also denies abdominal pain diarrhea or constipation.   Objective: Temp:  [97.1 F (36.2 C)-98.6 F (37 C)] 98.2 F (36.8 C) (08/18 0934) Pulse Rate:  [83-98] 85 (08/18 0934) Resp:  [18-22] 18 (08/18 0934) BP: (115-146)/(53-82) 133/82 mmHg (08/18 0934) SpO2:  [94 %-100 %] 100 % (08/18 0934) Weight:  [171 lb 15.3 oz (78 kg)] 171 lb 15.3  oz (78 kg) (08/18 0500) Physical Exam: General: NAD, AAOx4 HEENT: sclerae icteric, PERRLA,  EOMI, mild tenderness to palpation left cervical paraspinal muscles.  Cardiovascular: RRR, no m/r/g Respiratory: CTAB, no wheezes rales ronchi Abdomen: NBS, mild tenderness to deep palpation, negative murphy's sign, but positive for pain in RUQ>other quadrants Extremities: no cyanosis, clubbing, 2+pitting edema in left LE up to knee, pain in right calf to palpation > left calf, but negative Homans sign b/l. Peripheral pulses 2+ throughout Neuro: strength 5/5 throughout  Laboratory:  Recent Labs Lab 03/22/13 1416 03/24/13 2030 03/24/13 2039 03/24/13 2138 03/25/13 0525  WBC 2.7* 5.2  --   --  2.8*  HGB 9.6* 11.9* 12.2 10.9* 9.0*  HCT 27.7* 34.3* 36.0 32.0* 25.4*  PLT 51* 66*  --   --  39*    Recent Labs Lab 03/22/13 1416 03/24/13 2030 03/24/13 2039 03/24/13 2138  NA 136 134* 139 140  K 4.1 4.8 5.0 5.1  CL 108 103 106 108  CO2 26 23  --   --   BUN 11 12 12 12   CREATININE 0.63 0.64 0.70 0.70  CALCIUM 7.9* 8.5  --   --   PROT 5.9* 7.2  --   --   BILITOT 7.4* 9.0*  --   --   ALKPHOS 430* 495*  --   --   ALT 56* 62*  --   --   AST 110* 115*  --   --   GLUCOSE 93 158* 153* 177*    TSH: 0.726  Imaging/Diagnostic Tests:  8.16.14 PORTABLE CHEST - 1 VIEW  Comparison: February 09, 2013  Findings: There is no edema or consolidation. Heart size and  pulmonary vascularity are normal. No adenopathy. No bone lesions.  No pneumothorax.  IMPRESSION:  No edema or consolidation.  Original Report Authenticated By: Bretta Bang, M.D.   8.17.14 ABDOMEN ULTRASOUND LIMITED  COMPARISON: None.  FINDINGS:  Limited abdominal ultrasound survey of all 4 quadrants was performed  to evaluate for ascites. No ascites is visualized.  :  No ascites visualized.  Electronically Signed  By: Myles Rosenthal  On: 03/25/2013 14:29   Gretta Began, Med Student 03/26/2013, 12:10 PM MS-4, [facility] FPTS Intern pager: [phone], text pages welcome  FPTS Upper Level Addendum  S: Pt  seen at bedside. Feeling some better, but with neck pain and complaining of itching. Otherwise not much different. Not feeling as cold. No further seizures.  O: I have reviewed the patient's current medications, labs/imaging, and diagnostic tests. BP 126/50  Pulse 75  Temp(Src) 97.5 F (36.4 C) (Oral)  Resp 18  Ht 5\' 3"  (1.6 m)  Wt 171 lb 15.3 oz (78 kg)  BMI 30.47 kg/m2  SpO2 100% Gen: adult female, some psychomotor retardation ?at baseline HEENT: sclerae icteric, at baseline Cardio: RRR, no murmur appreciated Pulm: CTAB, no wheezes Abd: soft, mild vague tenderness to very deep palpation on the right, BS+ Ext: LE swelling remains, 2+, ?some improved with diuresis  A/P: 27yo female presented initially hypoglycemic/hypothermic after witnessed seizure. Generally improving, but still with LE swelling, pruritis, some vague neck pain.  #Acute encephalopathy/seizures - no further activity; likely hypoglycemia-induced -hypoglycemia improved, A1c 4.3, no hx of diabetes -continue Keppra, CBG checks  #Hypothermia - improved, uncertain etiology, possibly infectious -[place] /17 with no acute findings to suggest frank ascites or SBP -continue CTX pending urine cultures with no growth  #Autoimmune hepatitis - likely contribution to the above as well as contribution to LE swelling -  also likely contributing to pruritis (CMP with total bili 9 on admission) -continue spironolactone (will need Rx at d/c), Lasix 80 mg BID for now -likely restart nadolol at discharge (initially held for bradycardia; pulse improved to 70's+) -consider rifaximin 550 BID if pt does not tolerate or refuses lactulose -ordered for cholestryramine to help with itching; anticipate difficulty with compliance -f/u daily weights, I&O, target dry weight ~155 if tolerates diuresis  #Chronic anemia - Hb stable  #Substance abuse - advised cessation; pt refused cessation materials/information, CSW signed off  #FEN/GI - hepatic diet,  saline lock IV #Prophylaxis - SCD's given chronic thrombocytopenia  Dispo: management as above, likely discharge tomorrow pending improvement and either discontinuing abx or transition to PO  I have independently interviewed and examined this patient in conjunction with Mr. Blondell Reveal, whose note is documented above with any additions/clarifications/edits in orange. The above addendum reflects my independent interview, exam, and assessment/plan. Please see also attending notes.   Bobbye Morton, MD  PGY-2, [facility]  03/26/2013, 3:02 PM FPTS Service Pager: [phone] (text pages welcome through AMION, password "[email or web address]")

## 2013-03-27 ENCOUNTER — Encounter: Payer: Self-pay | Admitting: *Deleted

## 2013-03-27 DIAGNOSIS — K7460 Unspecified cirrhosis of liver: Secondary | ICD-10-CM

## 2013-03-27 DIAGNOSIS — I517 Cardiomegaly: Secondary | ICD-10-CM

## 2013-03-27 DIAGNOSIS — R011 Cardiac murmur, unspecified: Secondary | ICD-10-CM | POA: Clinically undetermined

## 2013-03-27 DIAGNOSIS — G40909 Epilepsy, unspecified, not intractable, without status epilepticus: Secondary | ICD-10-CM

## 2013-03-27 DIAGNOSIS — K754 Autoimmune hepatitis: Secondary | ICD-10-CM

## 2013-03-27 LAB — CBC WITH DIFFERENTIAL/PLATELET
Eosinophils Absolute: 0.1 10*3/uL (ref 0.0–0.7)
Eosinophils Relative: 2 % (ref 0–5)
Hemoglobin: 9.2 g/dL — ABNORMAL LOW (ref 12.0–15.0)
Lymphs Abs: 1 10*3/uL (ref 0.7–4.0)
MCH: 33.6 pg (ref 26.0–34.0)
MCV: 97.4 fL (ref 78.0–100.0)
Monocytes Relative: 9 % (ref 3–12)
RBC: 2.74 MIL/uL — ABNORMAL LOW (ref 3.87–5.11)

## 2013-03-27 LAB — LEVETIRACETAM LEVEL: Levetiracetam Lvl: 29.2 ug/mL (ref 5.0–30.0)

## 2013-03-27 LAB — GLUCOSE, CAPILLARY
Glucose-Capillary: 123 mg/dL — ABNORMAL HIGH (ref 70–99)
Glucose-Capillary: 92 mg/dL (ref 70–99)

## 2013-03-27 LAB — COMPREHENSIVE METABOLIC PANEL
BUN: 11 mg/dL (ref 6–23)
Calcium: 8.2 mg/dL — ABNORMAL LOW (ref 8.4–10.5)
Creatinine, Ser: 0.76 mg/dL (ref 0.50–1.10)
GFR calc Af Amer: >90 mL/min (ref >90)
Glucose, Bld: 84 mg/dL (ref 70–99)
Total Protein: 5.4 g/dL — ABNORMAL LOW (ref 6.0–8.3)

## 2013-03-27 MED ORDER — CAMPHOR-MENTHOL 0.5-0.5 % EX LOTN: TOPICAL_LOTION | CUTANEOUS | Status: DC | PRN

## 2013-03-27 MED ORDER — FUROSEMIDE 40 MG PO TABS: 40.0000 mg | ORAL_TABLET | Freq: Every day | ORAL | Status: DC

## 2013-03-27 MED ORDER — PNEUMOCOCCAL VAC POLYVALENT 25 MCG/0.5ML IJ INJ
0.5000 mL | INJECTION | Freq: Once | INTRAMUSCULAR | Status: DC
  Filled 2013-03-27: qty 0.5

## 2013-03-27 MED ORDER — SPIRONOLACTONE 100 MG PO TABS: 100.0000 mg | ORAL_TABLET | Freq: Every day | ORAL | Status: DC

## 2013-03-27 MED ORDER — PNEUMOCOCCAL VAC POLYVALENT 25 MCG/0.5ML IJ INJ: 0.5000 mL | INJECTION | INTRAMUSCULAR | Status: DC

## 2013-03-27 NOTE — Progress Notes (Signed)
Education completed, discharge orders received. Follow up appointments and medications reviewed with patient. Discharged home via wheelchair with family.

## 2013-03-27 NOTE — Discharge Summary (Signed)
Family Medicine Teaching [facility] Discharge Summary  Patient name: [name] Medical record number: [id number] Date of birth: Mar 04, 1986 Age: 27 y.o. Gender: female Date of Admission: 03/24/2013  Date of Discharge: 03/27/13 Admitting Physician: Terri Roach McDiarmid, MD  Primary Care Provider: Levert Feinstein, MD Consultants: none  Indication for Hospitalization: possible loss of consciousness with EMS-witnessed seizure, hypoglycemia, and hypothermia.   Discharge Diagnoses/Problem List:  Acute Encephalopathy secondary to seizures, resolved Presumed sepsis, resolved Hypoglycemia, resolved Hypothermia, resolved Autoimmune hepatitis with cirrhosis/portal hypertension Chronic anemia, stable at baseline Substance abuse- hx of alcohol and cocaine use New heart murmur (appreciated on discharge day)  Disposition: discharge home   Discharge Condition: stable  Brief Hospital Course: [name] is a 27 year old presented with a possible loss of consciousness/EMS-witnessed seizures after being found at home confused/with diminished responsiveness. PMH significant for seizure disorder, chronic autoimmune hepatitis with resultant cirrhosis and chronic thrombocytopenia, chronic leg swelling, and hx of alcohol and cocaine abuse (cocaine positive UDS on admission). Found in the ED to be bradycardic to ~60, initially afebrile, but hypothermic once arriving to floor.  # Acute Encephalopathy/seizures, resolved - felt to be secondary to seizures/post-ictal state with possible contribution of infection/sepsis (see below) - uncertain etiology of seizure, though believe secondary to hypoglycemia (see below) - no seizures during hospital stay, with mental status apparently at baseline by time of arrival from ED to floor - continued home Keppra dosing; Keppra level 8/17 suggests compliance as an outpt - TSH was measured on 8.18.14 to be 0.726, A1C was 4.3.   # Hypoglycemia, resolved: uncertain  ediology; possibly secondary to poor PO intake, ?chronic low glycogen stores in chronic liver disease - normalized to 80s-90s by 8/19 (high of 177 at presentation after D50 for CBG in 50's according to EMS)  # Hypothermia/bradycardia, resolved: uncertain etiology, though possibly infectious concerning for sepsis - temp 98 in the ED; unreadable by arrival to floor with pt complaining of feeling cold - pt did have some non-specific neck pain but no frank meningitic signs, and neck pain resolved on its own - improved with warming blanket on night of admission and s/p ceftriaxone empirically x3 doses - UA on admission suggestive of UTI, though cultures to date are negative - no acute findings on [place] 8/17 to suggest SBP in chronic liver disease  # Autoimmune hepatitis with cirrhosis/portal hypertension: hyperammonemia at presentation (88, mildly elevated) -continued home medications, spironolactone; held nadolol for bradycardia in the ED -hyperammonemia treated with home dose of lactulose (pt uses at home "PRN for constipation") -LE edema noted on presentation, improved with home spironolactone plus Lasix 80 mg BID for 3 days -discharged with Lasix for 5 days to manage acute swelling; nadolol resumed at discharge -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  - transaminits present at admission, improved but not resolved completely by time of discharge - bilirubin trending down from 9.0 on presentation to discharge day at 7.2.  # Chronic anemia: Hb 11.9 on presentation down to 9.2 by discharge; baseline is 9-10 - likely secondary to severity of chronic disease, poor nutrition habits, etc; no obvious bleeding noted     # Substance abuse:- hx of alcohol and cocaine use with UDS  positive for cocaine on admission - pt admitted cocaine use one week prior to presentation  but refused discussion with social worker on 8/18 - advised cessation of substance use (especially EtOH) given severity of liver disease - would benefit from continued outpt counseling  # Systolic heart murmur - Not appreciate on admission, noted on discharge day 8/19 -ECHO generally unremarkable though with increased flow rates that could explain. -no chest pain or frank new anemia, pt otherwise asymptomatic  Issues for Follow Up:  1. Seizure disorder - strongly recommend counseling on continued Keppra compliance -please evaluate for any further signs/symptoms of incompletely treated infection and follow up blood cultures  2. Autoimmune hepatitis - strongly recommended cessation of alcohol and cocaine use -would recommend re-referral to GI for evaluation/management of cirrhosis (pt will need PCP referral) -Rx given for spironolactone at previous dose and Lasix x5 days -would monitor weight for return toward previous baseline (~160-170 this admission, baseline ?150-155) -could consider cholestyramine for chronic pruritis but expect difficulty with compliance/timing of dosing, etc  3. Chronic anemia - would recommend routine surveillance as an outpt, based on any new/worse symptoms  4. Heart murmur - noted on discharge day, as above; echo unremarkable, would follow clinically for now  5. Substance abuse - would advise continued cessation counseling, as above, and/or discussion of community resources    Significant Procedures:  2D Echocardiogram 8/19 Study Conclusions - Left ventricle: The cavity size was normal. Wall thickness was normal. Systolic function was normal. The estimated ejection fraction was in the range of 60% to 65%. Wall motion was normal; there were no regional wall motion abnormalities. E-A fusion otherwise limits evaluation of LV diastolic function. - Left atrium: The atrium  was mildly dilated. Impressions: - Generalized increased flow velocities, consistent with cardiac output, may be responsible for systolic murmurs.  Significant Labs and Imaging:   Recent Labs Lab 03/24/13 2030  03/24/13 2138 03/25/13 0525 03/27/13 0400  WBC 5.2  --   --  2.8* 3.6*  HGB 11.9*  < > 10.9* 9.0* 9.2*  HCT 34.3*  < > 32.0* 25.4* 26.7*  PLT 66*  --   --  39* 31*  < > = values in this interval not displayed.  Recent Labs Lab 03/22/13 1416 03/24/13 2030 03/24/13 2039 03/24/13 2138 03/27/13 0400  NA 136 134* 139 140 134*  K 4.1 4.8 5.0 5.1 4.2  CL 108 103 106 108 99  CO2 26 23  --   --  30  GLUCOSE 93 158* 153* 177* 84  BUN 11 12 12 12 11   CREATININE 0.63 0.64 0.70 0.70 0.76  CALCIUM 7.9* 8.5  --   --  8.2*  ALKPHOS 430* 495*  --   --  397*  AST 110* 115*  --   --  178*  ALT 56* 62*  --   --  74*  ALBUMIN <2.0* 1.8*  --   --  1.5*    Recent Labs Lab 03/22/13 1416 03/25/13 0525  INR 1.59* 1.91*   Lactic acid 8/16: 1.39 Ammonia 8/16 (presentation): 88 Pro-BNP 8/17: 376.9 TSH 8/17: 0.726  Keppra level 8/17: 29.2  Urine 8/16: UA significant for moderate Hb UDS 8/16: positive for cocaine EtOH 8/16: <11  Micro:  Urine culture 8/17: 60k CFU/mL mixed morphotypes, none predominant  Blood cultures 8/17: no growth to date  Abdominal ultrasound 8/17: No ascites or findings suggestive of SBP noted  CXR 8/16 (1-view): No edema or consolidation  Outstanding Results: blood cultures 8/17 final report  Discharge Medications:    Medication List  acetaminophen 500 MG tablet  Commonly known as:  TYLENOL  Take 1,500-2,000 mg by mouth 2 (two) times daily as needed for pain. Normal dose 3 tablets, sometimes takes 4 tablets at night     camphor-menthol lotion  Commonly known as:  SARNA  Apply topically as needed for itching.     EPINEPHrine 0.3 mg/0.3 mL Devi  Commonly known as:  EPI-PEN  Inject 0.3 mg into the muscle as needed (for allergic  reaction).     furosemide 40 MG tablet  Commonly known as:  LASIX  Take 1 tablet (40 mg total) by mouth daily. For five days starting 8/20     hydrOXYzine 25 MG tablet  Commonly known as:  ATARAX/VISTARIL  Take 50 mg by mouth daily as needed for itching.     lactulose 10 GM/15ML solution  Commonly known as:  CHRONULAC  Take 20 g by mouth daily as needed (for constipation).     levETIRAcetam 750 MG tablet  Commonly known as:  KEPPRA  Take 750 mg by mouth every 12 (twelve) hours.     nadolol 20 MG tablet  Commonly known as:  CORGARD  Take 20 mg by mouth at bedtime.     potassium chloride SA 20 MEQ tablet  Commonly known as:  K-DUR,KLOR-CON  Take 20 mEq by mouth 2 (two) times daily as needed (if hands lock up).     PRILOSEC 20 MG capsule  Generic drug:  omeprazole  Take 20 mg by mouth daily. 6am     spironolactone 100 MG tablet  Commonly known as:  ALDACTONE  Take 1 tablet (100 mg total) by mouth daily. May take an additional tablet in the evening for excessive fluid buildup.       Discharge Instructions: Please refer to Patient Instructions section of EMR for full details.  Patient was counseled important signs and symptoms that should prompt return to medical care, changes in medications, dietary instructions, activity restrictions, and follow up appointments.   Follow-Up Appointments: Follow-up Information   Follow up with Terri Feinstein, MD On 03/29/2013. (Appointment at 2:30 PM)    Specialty:  Family Medicine   Contact information:   [address] [place] [address] [phone]       Follow up with [facility]. (Talk to Dr. Pollie Meyer about being seen at the gastroenterologist's office)    Specialty:  Gastroenterology   Contact information:   [address] [place] [address] [phone]     Gretta Began, Med Rojas 03/27/2013, 12:23 PM MS4, [facility]  FPTS Upper Level Resident  Addendum  I independently interviewed and examined this patient. Her discharge planning as outlined above was discussed and arranged by the FPTS team as a whole, including attending physician Dr. Lum Babe. I have discussed the above with Mr. Bonnita Levan and agree with his documentation as above. The above reflects his original note with my edits for correction/additions/clarification/emphasis.  Bobbye Morton, MD PGY-2, [facility] 03/27/2013, 8:56 PM

## 2013-03-27 NOTE — Progress Notes (Signed)
Family Medicine Teaching Service Daily Progress Note Intern Pager: [phone]  Patient name: [name] Medical record number: [id number] Date of birth: [birth year] Age: 27 y.o. Gender: female  Primary Care Provider: Levert Feinstein, MD Consultants: none Code Status: full code  Pt Overview and Major Events to Date: 27 year old F with autoimmune hepatitis and seizure disorder presenting with seizures and found to be hypoglycemic and hypothermic.  03/24/13: CXR, no edema or consolidation; Abdominal U/S- no ascites or acute findings: transaminitis AST>ALT, and total bili of 9.0   03/25/13: EKG- NSR with 1 PVC  Assessment and Plan:  27 year old female presents with loss of consciousness/EMS-witnessed seizures after being found at home by family members confused and decreased responsiveness. Patient presented with hypoglycemia, hypothermia, bradycardia, and EMS-witnessed seizures. PMH of seizure disorder, chronic auotimmune hepatitis, cirrhosis, chronic thrombocytopenia, chronic leg edema, history of alcohol and cocaine abuse.   #Acute encephalopathy / seizures, resolved - uncertain cause with multiple possible precipitating factors including hypoglycemia, medication non-compliance, infection, opiate withdrawal. -no seizures since admission  -continue home Keppra and monitor closely  -seizure precautions, regular neuro checks     #Neck Pain, NOS- improved: Patient described sleeping flat which seems to have helped her neck pain.   #Hypoglycemia, resolved - unknown cause. Possibly related to poor PO intake. No history of diabetes or insulin use.  - monitor CBGs for now   #Hypothermia, resolved - possibly related to hypoglycemia/sepsis/infection and thyroid dysfunction.  - blood and urine cultures: day 2: urine multiple bacterial morphotypes present, none predominant - discontinue empiric ceftriaxone due to insignificant urine cultures; pt completed 3 days abx - temperature has been  stable  #Autoimmune hepatitis with cirrhosis/portal hypertension, stable at baseline - recent outpt labs with MELD score 19 within the last week  - likely contribution to chronic LE, which is improving, swelling and mild hyperammonemia on admission - continue home spironolactone, Lasix 80 mg BID while admitted, folic acid, thiamine - holding nadolol due to bradycardia on admission; bradycardia improved, likely restart nadolol on discharge - pruritis has improved this morning, total bilirubin decreased, continue benadryl and hydroxyzine PRN itching - suggested cholestyramine 8/18, but pt refused and believe she would be poorly compliant as an outpt, so likely will not Rx at discharge  #Chronic anemia, stable at baseline - likely multifactorial given other medical conditions. Hb at 9.2 this morning  #Substance abuse - admitted cocaine use one week ago when confronted with positive UDS, as well as alcohol use. Refused to work with Child psychotherapist about drug/alcohol problem.   #Cardiac Murmur- 2/6 pansystolic blowing murmur heard best at left sternal boarder without radiation to carotids. Upon reviewing note history, no record found of previous murmur. May suggest to patient that she seek outpatient evaluation.    FEN/GI: hepatic diet, KVO fluids Prophylaxis: SCD's due to thrombocytopenia   Disposition: discharge pending clinical improvement; possibly d/c home today with close f/u  Subjective: [name] states that she is not sleeping well and is tired. Her vision is still blurry, which started yesterday, but her story about how it started is slightly inconsistent, not remembering at first whether she was looking at the television or her cell phone. She says that her itching has been getting better this morning. She also says that the swelling in her left leg is better but still hurts when people push on it.  Denies headache, dizziness, chest pain, shortness of breath, abdominal pain, nausea, vomiting,  dysuria, hematuria.  Objective: Temp:  Regin.Schirmer F (  36.1 C)-98.6 F (37 C)] 98.6 F (37 C) (08/19 0606) Pulse Rate:  [73-90] 73 (08/19 0606) Resp:  [18-19] 19 (08/19 0606) BP: (117-139)/(50-92) 122/53 mmHg (08/19 0606) SpO2:  [99 %-100 %] 100 % (08/19 0606) Weight:  [165 lb 2 oz (74.9 kg)] 165 lb 2 oz (74.9 kg) (08/19 0606) Physical Exam: General: NAD, laying comfortably in bed.  HEENT: sclera mildly jaundiced (improved from yesterday), EOMI Cardiovascular: RRR, 2/6 pansystolic murmur heard best at left sternal boarder at 5th ICS. No rubs or gallops.  Respiratory: CTAB, no wheezes, rales, ronchi Abdomen: NBS, NT, ND Extremities: no cyanosis, clubbing, 1+ pitting edema on left lower extremity up to mid shin. Peripheral pulses 2+ throughout.   Laboratory:  Recent Labs Lab 03/24/13 2030  03/24/13 2138 03/25/13 0525 03/27/13 0400  WBC 5.2  --   --  2.8* 3.6*  HGB 11.9*  < > 10.9* 9.0* 9.2*  HCT 34.3*  < > 32.0* 25.4* 26.7*  PLT 66*  --   --  39* 31*  < > = values in this interval not displayed.  Recent Labs Lab 03/22/13 1416  03/24/13 2030 03/24/13 2039 03/24/13 2138 03/27/13 0400  NA 136  --  134* 139 140 134*  K 4.1  --  4.8 5.0 5.1 4.2  CL 108  --  103 106 108 99  CO2 26  --  23  --   --  30  BUN 11  --  12 12 12 11   CREATININE 0.63  < > 0.64 0.70 0.70 0.76  CALCIUM 7.9*  --  8.5  --   --  8.2*  PROT 5.9*  --  7.2  --   --  5.4*  BILITOT 7.4*  --  9.0*  --   --  7.1*  ALKPHOS 430*  --  495*  --   --  397*  ALT 56*  --  62*  --   --  74*  AST 110*  --  115*  --   --  178*  GLUCOSE 93  --  158* 153* 177* 84  < > = values in this interval not displayed.    Imaging/Diagnostic Tests: No new imaging in last 24 hours.   Gretta Began, Med Student 03/27/2013, 8:59 AM MSIV, [employer] [facility] FPTS Intern pager: [phone], text pages welcome  FPTS Upper Level Addendum  S: Some blurry vision overnight, vaguely described. Pt slept poorly. No further seizures.  Generally is feeling some better, just very tired. Leg swelling is getting some better.  O: I have reviewed the patient's current medications, labs/imaging, and diagnostic tests. BP 122/53  Pulse 73  Temp(Src) 98.6 F (37 C) (Oral)  Resp 19  Ht 5\' 3"  (1.6 m)  Wt 165 lb 2 oz (74.9 kg)  BMI 29.26 kg/m2  SpO2 100% Gen: adult female, some psychomotor retardation ?at baseline  HEENT: scleral icterus improving Cardio: RRR, soft blowing systolic murmur noted, ?not noted previously Pulm: CTAB, no wheezes  Abd: soft, nontender, BS+  Ext: LE swelling remains but improved, 1-2+  A/P: 27yo female presented initially hypoglycemic/hypothermic after witnessed seizure. Generally improving, but still with LE swelling, pruritis. Poor sleep last night.   #Acute encephalopathy/seizures - no further activity; likely hypoglycemia-induced -hypoglycemia improved, A1c 4.3, no hx of diabetes -continue Keppra, CBG checks  #Hypothermia - improved, uncertain etiology, possibly infectious -[place] /17 with no acute findings to suggest frank ascites or SBP -d/c ceftriaxone (completed 3 days)  #Autoimmune hepatitis - likely contribution  to the above as well as contribution to LE swelling -also likely contributing to pruritis (CMP with total bili 9 on admission) -continue spironolactone (will need Rx at d/c), Lasix 80 mg BID for now; consider short course of Lasix PO at d/c -likely restart nadolol at discharge (initially held for bradycardia; pulse improved to 70's+) -consider rifaximin 550 BID if pt does not tolerate or refuses lactulose -ordered for cholestryramine to help with itching; pt refused and anticipate difficulty with out pt compliance, so will d/c -f/u daily weights, I&O, target dry weight outpatient ~155 if tolerates diuresis  #Chronic anemia - Hb stable  #Substance abuse - advised cessation; pt refused cessation materials/information, CSW signed off  #Systolic ejection murmur - soft, ?new on exam;  could consider echocardiogram vs outpt monitoring  #FEN/GI - hepatic diet, saline lock IV #Prophylaxis - SCD's given chronic thrombocytopenia  #Dispo - management as above; likely discharge home, today with close follow-up I have independently interviewed and examined this patient in conjunction with Mr. Blondell Reveal, whose note is documented above with any additions/clarifications/edits in orange. The above addendum reflects my independent interview, exam, and assessment/plan. Please see also attending notes.   Bobbye Morton, MD  PGY-2, [facility]  03/27/2013, 9:49 AM FPTS Service Pager: [phone] (text pages welcome through AMION, password "[email or web address]")

## 2013-03-27 NOTE — Progress Notes (Signed)
  Echocardiogram 2D Echocardiogram has been performed.  [name] 03/27/2013, 2:41 PM

## 2013-03-27 NOTE — Progress Notes (Signed)
FMTS Attending Admission Note: Kehinde Eniola,MD I  have seen and examined this patient, reviewed their chart. I have discussed this patient with the resident. I agree with the resident's findings, assessment and care plan.  Patient feels better today,was itching a lot last night but feeling better with hydroxyzine prn. She denies neck or back pain,no abdominal pain,N/V or change in bowel habit.   Exam: Gen: Awake and alert,not in distress,comfortable in bed. Resp: Air entry equal b/l. Heart: Grade 2 pulmonic area systolic murmur.  Abdomen: Soft,NT/ND,BS normal. Ext: No edema.  A/P;  Seizure disorder: No new episode since admission,doing well on Keppra.                              Seizure and fall precaution in place.  Autoimmune hepatitis: No acute change in symptoms.                              Plan to set up GI follow up upon discharge.                              Continue home medication.  New Murmurs: Patient asymptomatic.                         Might be related to her anemia, R/O valvular disorder.                         To obtain baseline ECHO to assess valvular disorder.

## 2013-03-27 NOTE — Telephone Encounter (Signed)
This encounter was created in error - please disregard.

## 2013-03-28 NOTE — Discharge Summary (Signed)
FMTS Attending Admission Note: Kehinde Eniola,MD I  have seen and examined this patient, reviewed their chart. I have discussed this patient with the resident. I agree with the resident's findings, assessment and care plan.  

## 2013-03-29 ENCOUNTER — Inpatient Hospital Stay: Payer: Medicare Other | Admitting: Family Medicine

## 2013-03-31 LAB — CULTURE, BLOOD (ROUTINE X 2)
Culture: NO GROWTH
Culture: NO GROWTH

## 2013-04-03 ENCOUNTER — Telehealth: Payer: Self-pay | Admitting: Oncology

## 2013-04-03 NOTE — Telephone Encounter (Signed)
Pt called back and gave her appt for December MD only per pt rqst

## 2013-04-03 NOTE — Telephone Encounter (Signed)
Called pt on cell and home number to retun call ,left message

## 2013-04-11 ENCOUNTER — Other Ambulatory Visit: Payer: Self-pay | Admitting: Family Medicine

## 2013-04-12 ENCOUNTER — Other Ambulatory Visit: Payer: Self-pay | Admitting: *Deleted

## 2013-04-12 ENCOUNTER — Telehealth: Payer: Self-pay | Admitting: *Deleted

## 2013-04-12 MED ORDER — FUROSEMIDE 40 MG PO TABS: 40.0000 mg | ORAL_TABLET | Freq: Every day | ORAL | Status: DC

## 2013-04-12 NOTE — Telephone Encounter (Signed)
Fax from pharmacy states that pt has been having headaches and is concerned about taking acetaminophen -given her liver disease please advise. Hugill Haste, RN-BSN

## 2013-04-12 NOTE — Telephone Encounter (Signed)
I agree that patient should not be taking tylenol given her marked elevation in LFT's. Please call patient and pharmacy to let them know. Thanks! Latrelle Dodrill, MD

## 2013-04-19 ENCOUNTER — Ambulatory Visit: Payer: Medicare Other | Admitting: Family Medicine

## 2013-05-02 ENCOUNTER — Ambulatory Visit: Payer: Medicare Other | Admitting: Family Medicine

## 2013-05-16 ENCOUNTER — Encounter: Payer: Self-pay | Admitting: Family Medicine

## 2013-05-16 ENCOUNTER — Ambulatory Visit (INDEPENDENT_AMBULATORY_CARE_PROVIDER_SITE_OTHER): Payer: Medicare Other | Admitting: Family Medicine

## 2013-05-16 VITALS — BP 127/73 | HR 84 | Temp 98.0°F | Ht 63.0 in | Wt 176.0 lb

## 2013-05-16 DIAGNOSIS — Z23 Encounter for immunization: Secondary | ICD-10-CM

## 2013-05-16 DIAGNOSIS — F1410 Cocaine abuse, uncomplicated: Secondary | ICD-10-CM

## 2013-05-16 DIAGNOSIS — F1010 Alcohol abuse, uncomplicated: Secondary | ICD-10-CM

## 2013-05-16 MED ORDER — OLOPATADINE HCL 0.1 % OP SOLN: 1.0000 [drp] | Freq: Two times a day (BID) | OPHTHALMIC | Status: DC

## 2013-05-16 NOTE — Patient Instructions (Signed)
It was great to see you again today!  I sent in a medicine for your eyes. Let's try those. Come back in 2-3 weeks to see if it is helping.  Try warm or cool salt water gargles for your throat. Follow up later this week if still not feeling better.  Be well, Dr. Pollie Meyer

## 2013-05-16 NOTE — Progress Notes (Signed)
Patient ID: [name], female   DOB: [birth year], 27 y.o.   MRN: [id number]  HPI:  This is my first time seeing [name] in the clinic. She became my primary patient in July when her previous PCP graduated the program. She has been admitted to our FPTS and I've seen her socially in the hospital, but not yet in the clinic until today. She has no showed to several appointments since July.  Alcohol and cocaine abuse: She states that she went to [facility] in July to rehab from alcohol, but was only able to stay for two days because she began vomiting blood. Then she went to [facility] and had variceal ligation done. She has not consumed alcohol since that time. She has, however, still been doing cocaine about once every two weeks. She thinks she is ready to gradually stop using cocaine.   Sore throat: has had a sore throat since last night. Has also had some headache. Her eyes have been burning for several weeks. Denies abdominal pain. She requests a refill of hydrocodone.  ROS: See HPI  PMFSH: Updated current medications in epic.  Has hx of autoimmune hepatitis/sclerosing cholangitis, has f/u scheduled at [facility] next month.  PHYSICAL EXAM: BP 127/73  Pulse 84  Temp(Src) 98 F (36.7 C) (Oral)  Ht 5\' 3"  (1.6 m)  Wt 176 lb (79.833 kg)  BMI 31.18 kg/m2 Gen: NAD, pleasant, cooperative HEENT: no oropharyngeal exudates. Some enlarged papillae on posterior aspect of tongue. No cervical lymphadenopathy. Scleral icterus is present. Eyes mildly injected. No perilimbal redness. EOMI. Heart: RRR Lungs: CTAB Abdomen: soft, nontender to palpation Neuro: grossly nonfocal, speech intact  ASSESSMENT/PLAN:  # Sore throat: no signs of acute infection, possibly related to seasonal allergies. Has been present for less than 24 hours. No abnormalities on exam. - recommend warm or cool salt water gargles for throat  # Eyes burning: possibly allergic as well. Will rx patanol eye drops and have pt f/u in 2-3 weeks  to see if any improvement.  See problem based charting for additional assessment/plan.  FOLLOW UP: F/u in 2-3 weeks to discuss burning eyes  [place] J. Pollie Meyer, MD [facility]

## 2013-05-19 NOTE — Assessment & Plan Note (Signed)
Discussed with [name] today the risks of cocaine use, especially given her complicated medical history. I did tell her that I will certainly not prescribe any controlled substance for any patient who is using cocaine (she had requested refill of hydrocodone). Encouraged her efforts to cut back and eventually quit cocaine.

## 2013-05-19 NOTE — Assessment & Plan Note (Signed)
Currently not using alcohol. Encouraged continued abstinence from alcohol, especially given her liver disease.

## 2013-05-22 ENCOUNTER — Ambulatory Visit: Payer: Medicare Other | Admitting: Family Medicine

## 2013-05-23 ENCOUNTER — Telehealth: Payer: Self-pay | Admitting: *Deleted

## 2013-05-23 DIAGNOSIS — K7460 Unspecified cirrhosis of liver: Secondary | ICD-10-CM

## 2013-05-23 NOTE — Telephone Encounter (Signed)
PATIENT STATED SHE JUST HAD EGD DONE WITH [facility] IN [place]  HER LIVER DOC DONE IT SHE SAID  I CANT FIND THE REPORT DO YOU KNOW WHERE TO LOOK. THEY SAY IT SHOULD BE IN EPIC OR DOCS HAVE A WAY TO LOOK AT IT  SHE HAD RECALL FOR EGD WITH [facility] FROM [place] FOR DEC

## 2013-05-24 NOTE — Telephone Encounter (Signed)
If her last endoscopy was in July, 2015 she should have repeat exam in December

## 2013-05-30 NOTE — Telephone Encounter (Signed)
L/m FOR PT TO RETURN MY CALL

## 2013-06-06 ENCOUNTER — Ambulatory Visit: Payer: Medicare Other | Admitting: Family Medicine

## 2013-06-11 ENCOUNTER — Other Ambulatory Visit: Payer: Self-pay | Admitting: Family Medicine

## 2013-06-11 MED ORDER — SPIRONOLACTONE 100 MG PO TABS: 100.0000 mg | ORAL_TABLET | Freq: Every day | ORAL | Status: DC

## 2013-06-13 ENCOUNTER — Telehealth: Payer: Self-pay | Admitting: Family Medicine

## 2013-06-13 ENCOUNTER — Other Ambulatory Visit: Payer: Self-pay | Admitting: Family Medicine

## 2013-06-13 MED ORDER — FUROSEMIDE 40 MG PO TABS: 40.0000 mg | ORAL_TABLET | Freq: Every day | ORAL | Status: DC

## 2013-06-13 NOTE — Telephone Encounter (Signed)
To [facility] red team - please call pt and let her know I have refilled one month's worth of her medicines but she needs to schedule an appointment to follow up on her chronic medical problems. Thanks!   Terri Dodrill, MD

## 2013-06-14 ENCOUNTER — Ambulatory Visit (INDEPENDENT_AMBULATORY_CARE_PROVIDER_SITE_OTHER): Payer: Medicare Other | Admitting: Family Medicine

## 2013-06-14 ENCOUNTER — Encounter: Payer: Self-pay | Admitting: Family Medicine

## 2013-06-14 VITALS — BP 132/75 | HR 89 | Temp 98.6°F | Ht 60.0 in | Wt 167.7 lb

## 2013-06-14 DIAGNOSIS — N898 Other specified noninflammatory disorders of vagina: Secondary | ICD-10-CM

## 2013-06-14 DIAGNOSIS — K140 Glossitis: Secondary | ICD-10-CM

## 2013-06-14 LAB — POCT WET PREP (WET MOUNT): WBC, Wet Prep HPF POC: <5

## 2013-06-14 MED ORDER — FLUCONAZOLE 150 MG PO TABS: 150.0000 mg | ORAL_TABLET | Freq: Once | ORAL | Status: DC

## 2013-06-14 MED ORDER — METRONIDAZOLE 250 MG PO TABS: 250.0000 mg | ORAL_TABLET | Freq: Two times a day (BID) | ORAL | Status: DC

## 2013-06-14 MED ORDER — TRIAMCINOLONE ACETONIDE 0.1 % MT PSTE: 1.0000 "application " | PASTE | Freq: Two times a day (BID) | OROMUCOSAL | Status: DC

## 2013-06-14 MED ORDER — METRONIDAZOLE 500 MG PO TABS: 500.0000 mg | ORAL_TABLET | Freq: Two times a day (BID) | ORAL | Status: DC

## 2013-06-14 NOTE — Telephone Encounter (Signed)
Spoke with patient and informed her of below 

## 2013-06-14 NOTE — Patient Instructions (Signed)
You have an overgrowth of bacteria in your vagina called bacterial vaginosis. I am prescribing a medication for this. In addition I sent in a paste that you can use for your tongue sore. If it is still painful you can also use an over-the-counter anesthetic spreay like chloraseptic that you spray on to numb that part of your tongue.  Thank you,  Dr. Richarda Blade

## 2013-06-14 NOTE — Assessment & Plan Note (Addendum)
Discharge, some associated itching and movement, not sexually active - wet prep showed clue cells and yeast - metronidazole and diflucan given, instructed to start eating yogurt after completing antibiotic course - unscented soap, no douching

## 2013-06-14 NOTE — Progress Notes (Signed)
  Subjective:    Patient ID: [name], female    DOB: 1985-12-29, 27 y.o.   MRN: [id number]  HPI 27yo female presents with painful sore on tongue for the past 2 weeks. It is a constant burning pain that is worse with certain foods and rinsing with peroxide. Nothing makes it better. It's located on the right side at the base of the tongue and the pain does not radiate.  She is also complaining of vaginal discharge. It is clear but smelly, not fishy. It is not a large volume but does stain her underwear slightly yellowish. She is having some itching associated with it but no dysuria. She is not sexually active. She has had BV in the past but never had an STI.   Review of Systems  Constitutional: Negative for fever and fatigue.  HENT: Positive for mouth sores. Negative for sore throat.   Cardiovascular: Positive for leg swelling.  Genitourinary: Positive for vaginal discharge. Negative for dysuria, frequency, vaginal pain and pelvic pain.  All other systems reviewed and are negative.       Objective:   Physical Exam  Nursing note and vitals reviewed. Constitutional: She is oriented to person, place, and time. She appears well-developed and well-nourished. No distress.  HENT:  Head: Normocephalic and atraumatic.  Right Ear: External ear normal.  Left Ear: External ear normal.  Nose: Nose normal.  Eyes: Conjunctivae are normal. Scleral icterus is present.  Abdominal: Soft. She exhibits no distension.  Genitourinary: Pelvic exam was performed with patient supine. No labial fusion. There is no rash, lesion or injury on the right labia. There is no rash, lesion or injury on the left labia. No erythema, tenderness or bleeding around the vagina. No foreign body around the vagina. No signs of injury around the vagina. Vaginal discharge found.  Musculoskeletal: She exhibits edema.  Neurological: She is alert and oriented to person, place, and time.  Skin: Skin is warm and dry. No rash noted.  She is not diaphoretic.  Psychiatric: She has a normal mood and affect. Her behavior is normal.          Assessment & Plan:

## 2013-06-14 NOTE — Assessment & Plan Note (Signed)
apthous ulcer, 2 weeks duration - rx triamcinolone in orabase until healed - can also use otc chloraseptic-type spray for symptom relief

## 2013-06-18 ENCOUNTER — Ambulatory Visit: Payer: Medicare Other | Admitting: Family Medicine

## 2013-06-21 ENCOUNTER — Encounter: Payer: Self-pay | Admitting: Gastroenterology

## 2013-07-07 ENCOUNTER — Emergency Department (HOSPITAL_COMMUNITY): Payer: Medicare Other

## 2013-07-07 ENCOUNTER — Emergency Department (HOSPITAL_COMMUNITY)
Admission: EM | Admit: 2013-07-07 | Discharge: 2013-07-07 | Payer: Medicare Other | Attending: Emergency Medicine | Admitting: Emergency Medicine

## 2013-07-07 ENCOUNTER — Encounter (HOSPITAL_COMMUNITY): Payer: Self-pay | Admitting: Emergency Medicine

## 2013-07-07 DIAGNOSIS — Z87891 Personal history of nicotine dependence: Secondary | ICD-10-CM | POA: Insufficient documentation

## 2013-07-07 DIAGNOSIS — Z9884 Bariatric surgery status: Secondary | ICD-10-CM | POA: Insufficient documentation

## 2013-07-07 DIAGNOSIS — F29 Unspecified psychosis not due to a substance or known physiological condition: Secondary | ICD-10-CM | POA: Insufficient documentation

## 2013-07-07 DIAGNOSIS — Z79899 Other long term (current) drug therapy: Secondary | ICD-10-CM | POA: Insufficient documentation

## 2013-07-07 DIAGNOSIS — E876 Hypokalemia: Secondary | ICD-10-CM | POA: Insufficient documentation

## 2013-07-07 DIAGNOSIS — Z9889 Other specified postprocedural states: Secondary | ICD-10-CM | POA: Insufficient documentation

## 2013-07-07 DIAGNOSIS — R51 Headache: Secondary | ICD-10-CM | POA: Insufficient documentation

## 2013-07-07 DIAGNOSIS — R079 Chest pain, unspecified: Secondary | ICD-10-CM | POA: Insufficient documentation

## 2013-07-07 DIAGNOSIS — D696 Thrombocytopenia, unspecified: Secondary | ICD-10-CM

## 2013-07-07 DIAGNOSIS — F1410 Cocaine abuse, uncomplicated: Secondary | ICD-10-CM

## 2013-07-07 DIAGNOSIS — R17 Unspecified jaundice: Secondary | ICD-10-CM | POA: Insufficient documentation

## 2013-07-07 DIAGNOSIS — R5381 Other malaise: Secondary | ICD-10-CM | POA: Insufficient documentation

## 2013-07-07 DIAGNOSIS — L299 Pruritus, unspecified: Secondary | ICD-10-CM | POA: Insufficient documentation

## 2013-07-07 DIAGNOSIS — E871 Hypo-osmolality and hyponatremia: Secondary | ICD-10-CM

## 2013-07-07 DIAGNOSIS — Z792 Long term (current) use of antibiotics: Secondary | ICD-10-CM | POA: Insufficient documentation

## 2013-07-07 DIAGNOSIS — R748 Abnormal levels of other serum enzymes: Secondary | ICD-10-CM | POA: Insufficient documentation

## 2013-07-07 DIAGNOSIS — R112 Nausea with vomiting, unspecified: Secondary | ICD-10-CM | POA: Insufficient documentation

## 2013-07-07 DIAGNOSIS — R630 Anorexia: Secondary | ICD-10-CM | POA: Insufficient documentation

## 2013-07-07 DIAGNOSIS — R443 Hallucinations, unspecified: Secondary | ICD-10-CM | POA: Insufficient documentation

## 2013-07-07 DIAGNOSIS — Z8719 Personal history of other diseases of the digestive system: Secondary | ICD-10-CM | POA: Insufficient documentation

## 2013-07-07 LAB — URINALYSIS, ROUTINE W REFLEX MICROSCOPIC
Glucose, UA: NEGATIVE mg/dL
Ketones, ur: NEGATIVE mg/dL
Leukocytes, UA: NEGATIVE
Nitrite: NEGATIVE
Specific Gravity, Urine: 1.018 (ref 1.005–1.030)
Urobilinogen, UA: 0.2 mg/dL (ref 0.0–1.0)
pH: 7 (ref 5.0–8.0)

## 2013-07-07 LAB — CBC WITH DIFFERENTIAL/PLATELET
Basophils Relative: 0 % (ref 0–1)
Eosinophils Absolute: 0.1 10*3/uL (ref 0.0–0.7)
HCT: 27.5 % — ABNORMAL LOW (ref 36.0–46.0)
Hemoglobin: 9.9 g/dL — ABNORMAL LOW (ref 12.0–15.0)
Lymphs Abs: 1.1 10*3/uL (ref 0.7–4.0)
MCH: 33.8 pg (ref 26.0–34.0)
MCHC: 36 g/dL (ref 30.0–36.0)
Monocytes Absolute: 0.5 10*3/uL (ref 0.1–1.0)
Monocytes Relative: 12 % (ref 3–12)
Neutrophils Relative %: 64 % (ref 43–77)
RBC: 2.93 MIL/uL — ABNORMAL LOW (ref 3.87–5.11)
RDW: 15.6 % — ABNORMAL HIGH (ref 11.5–15.5)

## 2013-07-07 LAB — URINE MICROSCOPIC-ADD ON

## 2013-07-07 LAB — LIPASE, BLOOD: Lipase: 38 U/L (ref 11–59)

## 2013-07-07 LAB — TROPONIN I: Troponin I: <0.3 ng/mL (ref <0.30)

## 2013-07-07 LAB — RAPID URINE DRUG SCREEN, HOSP PERFORMED
Benzodiazepines: NOT DETECTED
Opiates: POSITIVE — AB
Tetrahydrocannabinol: NOT DETECTED

## 2013-07-07 LAB — PREGNANCY, URINE: Preg Test, Ur: NEGATIVE

## 2013-07-07 LAB — COMPREHENSIVE METABOLIC PANEL
ALT: 110 U/L — ABNORMAL HIGH (ref 0–35)
AST: 305 U/L — ABNORMAL HIGH (ref 0–37)
Alkaline Phosphatase: 566 U/L — ABNORMAL HIGH (ref 39–117)
CO2: 25 mEq/L (ref 19–32)
Calcium: 8.3 mg/dL — ABNORMAL LOW (ref 8.4–10.5)
GFR calc non Af Amer: 71 mL/min — ABNORMAL LOW (ref >90)
Potassium: 3.3 mEq/L — ABNORMAL LOW (ref 3.5–5.1)
Sodium: 128 mEq/L — ABNORMAL LOW (ref 135–145)
Total Bilirubin: 15.2 mg/dL — ABNORMAL HIGH (ref 0.3–1.2)
Total Protein: 6.1 g/dL (ref 6.0–8.3)

## 2013-07-07 LAB — AMMONIA: Ammonia: 23 umol/L (ref 11–60)

## 2013-07-07 LAB — PROTIME-INR: INR: 2.03 — ABNORMAL HIGH (ref 0.00–1.49)

## 2013-07-07 MED ORDER — HYDROCODONE-ACETAMINOPHEN 5-325 MG PO TABS
1.0000 | ORAL_TABLET | Freq: Once | ORAL | Status: AC
  Administered 2013-07-07: 1 via ORAL
  Filled 2013-07-07: qty 1

## 2013-07-07 MED ORDER — HYDROXYZINE HCL 25 MG PO TABS
25.0000 mg | ORAL_TABLET | Freq: Once | ORAL | Status: AC
  Administered 2013-07-07: 25 mg via ORAL
  Filled 2013-07-07: qty 1

## 2013-07-07 NOTE — ED Provider Notes (Signed)
  Face-to-face evaluation   History: She complains of vomiting, pruritus, and chest wall pain. Symptoms ongoing for 3 days. She is also concerned about increased sleepiness for several weeks. She admits to using cocaine, but says she uses it less, now than previously  Physical exam: Heart regular rate and rhythm, with 2/6 systolic murmur. Lungs clear to auscultation. Chest mild, diffuse, anterior tenderness, without crepitation or deformity.  Medical screening examination/treatment/procedure(s) were conducted as a shared visit with non-physician practitioner(s) and myself.  I personally evaluated the patient during the encounter  Flint Melter, MD 07/12/13 [phone]

## 2013-07-07 NOTE — ED Notes (Signed)
Pt w/ hx of cirrhosis.  States she was given abx for a bile duct infection.  States that she has been throwing up and is more jaundiced.  Also states that she has been having spasms in her arms.

## 2013-07-07 NOTE — ED Notes (Signed)
Patient has known liver failure, is jaundices. Having hand and abdominal cramping

## 2013-07-07 NOTE — ED Provider Notes (Signed)
CSN: [id number]     Arrival date & time 07/07/13  1114 History   First MD Initiated Contact with Patient 07/07/13 1118     Chief Complaint  Patient presents with  . Chest Pain  . Jaundice    HPI  [name] is a 27 y.o. female with a PMH of substance abuse, esophageal varices, liver cirrhosis, autoimmune hepatitis, primary sclerosing cholangitis, seizures, thrombocytopenia, and intracranial hemorrhage who presents to the ED for evaluation of chest pain and jaundice.  History was provided by the patient.  Patient has multiple complaints. Patient states she has not been feeling well since Wednesday (07/04/13).  She complains of fatigue, generalized weakness, cramping in her hands bilaterally, itching throughout, headache, loss of her hair, confusion, and chest pain. She states that she had multiple episodes of vomiting last night. She states that it started out with food-like substance and later changed to a green bile-like substance. She states she had a few episodes of blood-tinged emesis but denies gross hematemesis. She states that she developed focal right sided chest pain after her episodes of vomiting. She has continued to have chest pain. No shortness of breath, wheezing, difficulty breathing, or cough. She also has been having hallucinations. Her grandmother is present and states that the patient has been talking in her sleep. She is currently on lactulose however is noncompliant with her medications. She took a hydrocodone last night which improved her symptoms enough for her to fall to sleep. She denies any alcohol or drug use. She has been in contact with her GI specialist Dr. Piedad Climes at [facility]. She saw her 2 weeks ago for some upper abdominal pain. She was placed on an antibiotic (Levaquin?) For 10 days which she completed. She also has been in contact with her primary care provider who diagnosed her with a yeast infection and bacterial vaginosis. She was prescribed Diflucan and Flagyl which  she completed.  Her vaginal discharge has resolved. She denies any abdominal pain, dysuria, or hematuria currently. No vaginal bleeding. She has had 4 bowel movements with her last bowel movement this morning. She had an MRI of her abdomen last Friday however does not know the results.  Patient otherwise denies any fevers, rhinorrhea, sore throat, focal weakness, or loss of sensation.     Past Medical History  Diagnosis Date  . Substance abuse April 2013    Cocaine plus THC usage - delisted from liver transplant list.   . Esophageal varices     distal esophageal grade 1 varices, portal gastropathy on EGDs in 01/2010 and 07/2012:  no banding or other intervention undertaken on EGDs from 2009 - 07/2012.    [name] Cirrhosis of liver   . Autoimmune hepatitis     Confirmed via liver biopsy 2002  . PSC (primary sclerosing cholangitis)   . Seizures     onset in 2009 associated with vent requiring resp failure.   . Thrombocytopenia     associated with cirrhosis.  followed by Dr Alcide Evener  . Intracranial hemorrhage 2009    in association with severe thrombocytopenia   Past Surgical History  Procedure Laterality Date  . Esophagogastroduodenoscopy  07/18/2012    Procedure: ESOPHAGOGASTRODUODENOSCOPY (EGD);  Surgeon: Louis Meckel, MD;  Location: [facility];  Service: Endoscopy;  Laterality: N/A;  . Gastric varices banding  07/18/2012    Procedure: GASTRIC VARICES BANDING;  Surgeon: Louis Meckel, MD;  Location: [facility];  Service: Endoscopy;  Laterality: N/A;  . Ercp  01/2010  Sclerosing Cholangitis with no dominant stricures    Family History  Problem Relation Age of Onset  . Cancer Neg Hx    History  Substance Use Topics  . Smoking status: Former Smoker    Types: Cigarettes    Quit date: 08/09/2005  . Smokeless tobacco: Never Used  . Alcohol Use: No   OB History   Grav Para Term Preterm Abortions TAB SAB Ect Mult Living                 Review of Systems  Constitutional:  Positive for activity change, appetite change and fatigue. Negative for fever, chills and diaphoresis.  HENT: Negative for congestion, ear pain, rhinorrhea and sore throat.   Eyes: Negative for photophobia and visual disturbance.  Respiratory: Negative for cough, shortness of breath and wheezing.   Cardiovascular: Positive for chest pain. Negative for palpitations and leg swelling.  Gastrointestinal: Positive for nausea and vomiting. Negative for abdominal pain, diarrhea, constipation, blood in stool and rectal pain.  Genitourinary: Negative for dysuria, hematuria, decreased urine volume, vaginal bleeding, vaginal discharge, difficulty urinating and vaginal pain.  Musculoskeletal: Negative for back pain and myalgias.  Skin: Positive for color change (jaundice). Negative for wound.  Neurological: Positive for weakness (generalized) and headaches. Negative for dizziness, syncope and light-headedness.  Psychiatric/Behavioral: Positive for hallucinations and confusion.    Allergies  Ibuprofen  Home Medications   Current Outpatient Rx  Name  Route  Sig  Dispense  Refill  . acetaminophen (TYLENOL) 500 MG tablet   Oral   Take 1,500-2,000 mg by mouth 2 (two) times daily as needed for pain. Normal dose 3 tablets, sometimes takes 4 tablets at night         . acidophilus (RISAQUAD) CAPS capsule   Oral   Take 2 capsules by mouth daily.         . fluconazole (DIFLUCAN) 150 MG tablet   Oral   Take 1 tablet (150 mg total) by mouth once.   1 tablet   0   . furosemide (LASIX) 40 MG tablet   Oral   Take 1 tablet (40 mg total) by mouth daily.   30 tablet   1   . HYDROcodone-acetaminophen (NORCO/VICODIN) 5-325 MG per tablet   Oral   Take 1 tablet by mouth every 6 (six) hours as needed for moderate pain.         . hydrOXYzine (ATARAX/VISTARIL) 25 MG tablet   Oral   Take 50 mg by mouth daily as needed for itching.         . lactulose (CHRONULAC) 10 GM/15ML solution   Oral   Take  20 g by mouth daily as needed (for constipation).         [name] levETIRAcetam (KEPPRA) 750 MG tablet   Oral   Take 750 mg by mouth every 12 (twelve) hours.         . metroNIDAZOLE (FLAGYL) 250 MG tablet   Oral   Take 1 tablet (250 mg total) by mouth 2 (two) times daily.   14 tablet   0   . nadolol (CORGARD) 20 MG tablet   Oral   Take 20 mg by mouth at bedtime.          [name] olopatadine (PATANOL) 0.1 % ophthalmic solution   Both Eyes   Place 1 drop into both eyes 2 (two) times daily.   5 mL   1   . omeprazole (PRILOSEC) 20 MG capsule   Oral  Take 20 mg by mouth daily. 6am         . spironolactone (ALDACTONE) 100 MG tablet   Oral   Take 1 tablet (100 mg total) by mouth daily.   30 tablet   1   . triamcinolone (KENALOG) 0.1 % paste   Mouth/Throat   Use as directed 1 application in the mouth or throat 2 (two) times daily.   5 g   0   . EPINEPHrine (EPI-PEN) 0.3 mg/0.3 mL DEVI   Intramuscular   Inject 0.3 mg into the muscle as needed (for allergic reaction).           BP 140/61  Pulse 94  Temp(Src) 98.3 F (36.8 C) (Oral)  Resp 14  Filed Vitals:   07/07/13 1129 07/07/13 1415 07/07/13 1500 07/07/13 1714  BP: 140/61  105/47 125/56  Pulse: 94   73  Temp: 98.3 F (36.8 C)  98.6 F (37 C) 98.5 F (36.9 C)  TempSrc: Oral  Oral Oral  Resp: 14 16 14 14   SpO2:   100% 100%     Physical Exam  Constitutional: She is oriented to person, place, and time. She appears well-developed and well-nourished. No distress.  HENT:  Head: Normocephalic and atraumatic.  Right Ear: External ear normal.  Left Ear: External ear normal.  Nose: Nose normal.  Eyes: Right eye exhibits no discharge. Left eye exhibits no discharge. Scleral icterus is present.  Neck: Normal range of motion. Neck supple.  Cardiovascular: Normal rate, regular rhythm and intact distal pulses.  Exam reveals no gallop and no friction rub.   Murmur heard. Pulmonary/Chest: Effort normal and breath sounds  normal. No respiratory distress. She has no wheezes. She has no rales. She exhibits tenderness.    Abdominal: Soft. Bowel sounds are normal. She exhibits no distension and no mass. There is no tenderness. There is no rebound and no guarding.  Musculoskeletal: Normal range of motion. She exhibits no edema and no tenderness.  Patient able to ambulate without difficulty or ataxia.  No asterisk bilaterally  Neurological: She is alert and oriented to person, place, and time.  GCS 15. No focal neurological deficits. CN 2-12 intact.   Skin: Skin is warm and dry. No rash noted. She is not diaphoretic.  Jaundice.  Patient itching her arms, back, legs, and chest throughout exam.  No visible rash    ED Course  Procedures (including critical care time) Labs Review Labs Reviewed - No data to display Imaging Review No results found.  EKG Interpretation    Date/Time:  Saturday July 07 2013 11:29:10 EST Ventricular Rate:  85 PR Interval:  140 QRS Duration: 106 QT Interval:  366 QTC Calculation: 435 R Axis:   51 Text Interpretation:  Sinus rhythm Nonspecific T abnormalities, lateral leads since last tracing no significant change Confirmed by Effie Shy  MD, ELLIOTT ([id number]) on 07/07/2013 12:47:31 PM           EXAM INFO: [id number] UN 06/29/13  MRI ABDOMEN W WO CONTRAST MRCP  EXAM: Magnetic resonance imaging, abdomen without contrast material, followed by with contrast material and further sequences. Additional MRCP sequences obtained.  DICTATED: 06/29/13 16:42:24  INTERPRETATION LOCATION:  [facility]  CLINICAL INDICATION: 27 year old F.  571.5 - Cirrhosis of liver without mention of alcohol, CIRRHOSIS LIVER, PSC/AIH overlap - bump in Tbili and pain - eval for dominant stricture.  COMPARISON: MRI abdomen from 07/26/11.  TECHNIQUE: Multiplanar, multisequence imaging of the abdomen with and without intravenous gadolinium. Additional MRCP  sequences obtained.  FINDINGS:  The lung bases  are clear.  The liver has a markedly shrunken/cirrhotic appearance with diffuse severe fibrosis and central regeneration. No enhancing or washout lesions are identified to suggest hepatocellular carcinoma.  The SMV is markedly enlarged and extends into markedly enlarged gastrohepatic/perisplenic varices which drain into the left splenic vein. Multiple smaller caliber esophageal varices noted. The main portal vein is diminutive, unchanged. No portal vein  thrombus identified.  The gallbladder wall is within normal limits with small volume pericholecystic fluid noted. No gallstones or sludge identified. The common bile duct is slightly prominent measuring 7 mm in diameter, unchanged. There is mild irregularity of the common  hepatic duct (8:12) with no gross intrahepatic biliary ductal dilatation.  The spleen is enlarged measuring 16.0 cm, previously 19.1 cm.  The right adrenal gland is within normal limits. The left adrenal gland is poorly visualized given the large caliber upper abdominal varices.  The pancreas is within normal limits.  The kidneys are unremarkable.  No free fluid identified within the abdomen. Moderate to severe diffuse anasarca has increased since the prior exam.  The visualized bowel is unremarkable without evidence of obstruction.  No abdominal lymphadenopathy noted.  The visualized osseous structures are unremarkable.  IMPRESSION:  -- No significant biliary ductal dilatation.  -- Cirrhosis with severe fibrosis and unchanged sequelae of portal hypertension given large caliber upper abdominal varices and splenomegaly.  -- Small volume pericholecystic free fluid is like secondary to underlying liver disease as there is no gallbladder wall thickening or cholelithiasis.   Results for orders placed during the hospital encounter of 07/07/13  TROPONIN I      Result Value Range   Troponin I <0.30  <0.30 ng/mL  COMPREHENSIVE METABOLIC PANEL      Result Value Range    Sodium 128 (*) 135 - 145 mEq/L   Potassium 3.3 (*) 3.5 - 5.1 mEq/L   Chloride 95 (*) 96 - 112 mEq/L   CO2 25  19 - 32 mEq/L   Glucose, Bld 77  70 - 99 mg/dL   BUN 24 (*) 6 - 23 mg/dL   Creatinine, Ser 1.61  0.50 - 1.10 mg/dL   Calcium 8.3 (*) 8.4 - 10.5 mg/dL   Total Protein 6.1  6.0 - 8.3 g/dL   Albumin 1.5 (*) 3.5 - 5.2 g/dL   AST 096 (*) 0 - 37 U/L   ALT 110 (*) 0 - 35 U/L   Alkaline Phosphatase 566 (*) 39 - 117 U/L   Total Bilirubin 15.2 (*) 0.3 - 1.2 mg/dL   GFR calc non Af Amer 71 (*) >90 mL/min   GFR calc Af Amer 82 (*) >90 mL/min  LIPASE, BLOOD      Result Value Range   Lipase 38  11 - 59 U/L  PROTIME-INR      Result Value Range   Prothrombin Time 22.3 (*) 11.6 - 15.2 seconds   INR 2.03 (*) 0.00 - 1.49  AMMONIA      Result Value Range   Ammonia 23  11 - 60 umol/L  URINALYSIS, ROUTINE W REFLEX MICROSCOPIC      Result Value Range   Color, Urine AMBER (*) YELLOW   APPearance CLOUDY (*) CLEAR   Specific Gravity, Urine 1.018  1.005 - 1.030   pH 7.0  5.0 - 8.0   Glucose, UA NEGATIVE  NEGATIVE mg/dL   Hgb urine dipstick SMALL (*) NEGATIVE   Bilirubin Urine LARGE (*) NEGATIVE   Ketones, ur  NEGATIVE  NEGATIVE mg/dL   Protein, ur NEGATIVE  NEGATIVE mg/dL   Urobilinogen, UA 0.2  0.0 - 1.0 mg/dL   Nitrite NEGATIVE  NEGATIVE   Leukocytes, UA NEGATIVE  NEGATIVE  PREGNANCY, URINE      Result Value Range   Preg Test, Ur NEGATIVE  NEGATIVE  URINE RAPID DRUG SCREEN (HOSP PERFORMED)      Result Value Range   Opiates POSITIVE (*) NONE DETECTED   Cocaine POSITIVE (*) NONE DETECTED   Benzodiazepines NONE DETECTED  NONE DETECTED   Amphetamines NONE DETECTED  NONE DETECTED   Tetrahydrocannabinol NONE DETECTED  NONE DETECTED   Barbiturates NONE DETECTED  NONE DETECTED  CBC WITH DIFFERENTIAL      Result Value Range   WBC 4.7  4.0 - 10.5 K/uL   RBC 2.93 (*) 3.87 - 5.11 MIL/uL   Hemoglobin 9.9 (*) 12.0 - 15.0 g/dL   HCT 29.5 (*) 62.1 - 30.8 %   MCV 93.9  78.0 - 100.0 fL   MCH  33.8  26.0 - 34.0 pg   MCHC 36.0  30.0 - 36.0 g/dL   RDW 65.7 (*) 84.6 - 96.2 %   Platelets 30 (*) 150 - 400 K/uL   Neutrophils Relative % 64  43 - 77 %   Neutro Abs 3.0  1.7 - 7.7 K/uL   Lymphocytes Relative 23  12 - 46 %   Lymphs Abs 1.1  0.7 - 4.0 K/uL   Monocytes Relative 12  3 - 12 %   Monocytes Absolute 0.5  0.1 - 1.0 K/uL   Eosinophils Relative 2  0 - 5 %   Eosinophils Absolute 0.1  0.0 - 0.7 K/uL   Basophils Relative 0  0 - 1 %   Basophils Absolute 0.0  0.0 - 0.1 K/uL  URINE MICROSCOPIC-ADD ON      Result Value Range   Squamous Epithelial / LPF MANY (*) RARE   WBC, UA 0-2  <3 WBC/hpf   RBC / HPF 3-6  <3 RBC/hpf   Bacteria, UA MANY (*) RARE   Casts HYALINE CASTS (*) NEGATIVE   Urine-Other MUCOUS PRESENT    BILIRUBIN, DIRECT      Result Value Range   Bilirubin, Direct 9.9 (*) 0.0 - 0.3 mg/dL    MDM   [name] is a 27 y.o. female with a PMH of substance abuse, esophageal varices, liver cirrhosis, autoimmune hepatitis, primary sclerosing cholangitis, seizures, thrombocytopenia, and intracranial hemorrhage who presents to the ED for evaluation of chest pain and jaundice.    Previous labs on 06/22/13 Total bilirubin = 8.6  Direct Bilirubin = 6.6  Platelet = 57  AST = 215  ALT = 183 Sodium = 134    Rechecks  2:14 PM = Itching improved.  Complaining of chest pain.   3:30 PM = Patient upset we are "not doing anything for her pain." States Hydrocodone worked only for "a little bit."  She states "they usually give me IV pain medications."  Patient asking for something to eat and drink.     Consults  4:00 PM = Spoke with Dr. Fatima Blank with hepatology at [facility] who recommends transfer to the ED and he will consult.   4:45 PM = Spoke with ED physician Dr. Massie Maroon who accepts transfer.      Patient evaluated in the ED for multiple complaints.  She complained of nausea and vomiting, but did not have any episodes throughout her ED visit.  Abdominal exam benign.  She  was found to have elevated liver enzymes and bilirubin from her previous levels two weeks ago.  A hepatologist specialist at [facility] was consulted who recommended transfer for further care and management.  Patient transferred.  Patient also complained of chest pain which was reproducible and focal.  This is possibly due to musculoskeletal pain from retching/vomiting.  EKG, chest x-ray, and troponin negative.  Patient also complained of confusion and hallucinations however was alert and oriented.  Her ammonia was not elevated and she had no neurological deficits on exam.  She denied any cocaine use but was found to have a positive drug screen for cocaine.  She also complained of itching which did not improve with hydroxyzine.  She was also found to have hyponatremia.  Patient stable throughout her ED visit.  Transferred to [facility] ED with GI consult.  Patient in agreement with transfer and plan.    Final impressions: 1. Elevated bilirubin   2. Chest pain   3. Pruritus   4. Hyponatremia   5. Elevated liver enzymes   6. Cocaine abuse   7. Thrombocytopenia       Luiz Iron PA-C   This patient was discussed with Dr. Arloa Koh, PA-C 07/08/13 2220

## 2013-07-09 ENCOUNTER — Ambulatory Visit: Payer: Medicare Other | Admitting: Family Medicine

## 2013-07-09 LAB — URINE CULTURE: Colony Count: NO GROWTH

## 2013-07-19 ENCOUNTER — Inpatient Hospital Stay (HOSPITAL_COMMUNITY)
Admission: EM | Admit: 2013-07-19 | Discharge: 2013-07-26 | DRG: 444 | Disposition: A | Discharge status: Home / Self Care | Payer: Medicare Other | Attending: Family Medicine | Admitting: Family Medicine

## 2013-07-19 ENCOUNTER — Emergency Department (HOSPITAL_COMMUNITY)
Admission: EM | Admit: 2013-07-19 | Discharge: 2013-07-19 | Discharge status: Against Medical Advice | Payer: Medicare Other | Source: Home / Self Care

## 2013-07-19 DIAGNOSIS — G9389 Other specified disorders of brain: Secondary | ICD-10-CM | POA: Diagnosis present

## 2013-07-19 DIAGNOSIS — K7682 Hepatic encephalopathy: Secondary | ICD-10-CM | POA: Diagnosis present

## 2013-07-19 DIAGNOSIS — R109 Unspecified abdominal pain: Secondary | ICD-10-CM

## 2013-07-19 DIAGNOSIS — L299 Pruritus, unspecified: Secondary | ICD-10-CM

## 2013-07-19 DIAGNOSIS — K7460 Unspecified cirrhosis of liver: Secondary | ICD-10-CM

## 2013-07-19 DIAGNOSIS — I8500 Esophageal varices without bleeding: Secondary | ICD-10-CM | POA: Diagnosis present

## 2013-07-19 DIAGNOSIS — K769 Liver disease, unspecified: Secondary | ICD-10-CM | POA: Diagnosis present

## 2013-07-19 DIAGNOSIS — Z515 Encounter for palliative care: Secondary | ICD-10-CM

## 2013-07-19 DIAGNOSIS — K766 Portal hypertension: Secondary | ICD-10-CM

## 2013-07-19 DIAGNOSIS — Z87891 Personal history of nicotine dependence: Secondary | ICD-10-CM | POA: Insufficient documentation

## 2013-07-19 DIAGNOSIS — K8309 Other cholangitis: Principal | ICD-10-CM

## 2013-07-19 DIAGNOSIS — R4182 Altered mental status, unspecified: Secondary | ICD-10-CM

## 2013-07-19 DIAGNOSIS — R188 Other ascites: Secondary | ICD-10-CM | POA: Diagnosis present

## 2013-07-19 DIAGNOSIS — R112 Nausea with vomiting, unspecified: Secondary | ICD-10-CM

## 2013-07-19 DIAGNOSIS — K754 Autoimmune hepatitis: Secondary | ICD-10-CM

## 2013-07-19 DIAGNOSIS — G9340 Encephalopathy, unspecified: Secondary | ICD-10-CM

## 2013-07-19 DIAGNOSIS — F1410 Cocaine abuse, uncomplicated: Secondary | ICD-10-CM

## 2013-07-19 DIAGNOSIS — D6859 Other primary thrombophilia: Secondary | ICD-10-CM

## 2013-07-19 DIAGNOSIS — G40909 Epilepsy, unspecified, not intractable, without status epilepticus: Secondary | ICD-10-CM | POA: Diagnosis present

## 2013-07-19 DIAGNOSIS — R5381 Other malaise: Secondary | ICD-10-CM | POA: Insufficient documentation

## 2013-07-19 DIAGNOSIS — I8510 Secondary esophageal varices without bleeding: Secondary | ICD-10-CM | POA: Diagnosis present

## 2013-07-19 DIAGNOSIS — R7401 Elevation of levels of liver transaminase levels: Secondary | ICD-10-CM

## 2013-07-19 DIAGNOSIS — K7290 Hepatic failure, unspecified without coma: Secondary | ICD-10-CM | POA: Diagnosis present

## 2013-07-19 DIAGNOSIS — D696 Thrombocytopenia, unspecified: Secondary | ICD-10-CM

## 2013-07-19 DIAGNOSIS — D61818 Other pancytopenia: Secondary | ICD-10-CM

## 2013-07-19 DIAGNOSIS — N390 Urinary tract infection, site not specified: Secondary | ICD-10-CM | POA: Diagnosis present

## 2013-07-19 DIAGNOSIS — Z79899 Other long term (current) drug therapy: Secondary | ICD-10-CM

## 2013-07-19 DIAGNOSIS — R1110 Vomiting, unspecified: Secondary | ICD-10-CM | POA: Insufficient documentation

## 2013-07-19 DIAGNOSIS — G8929 Other chronic pain: Secondary | ICD-10-CM

## 2013-07-19 DIAGNOSIS — K8301 Primary sclerosing cholangitis: Secondary | ICD-10-CM

## 2013-07-19 LAB — CBC WITH DIFFERENTIAL/PLATELET
HCT: 25.8 % — ABNORMAL LOW (ref 36.0–46.0)
Hemoglobin: 9.3 g/dL — ABNORMAL LOW (ref 12.0–15.0)
Lymphocytes Relative: 10 % — ABNORMAL LOW (ref 12–46)
Lymphs Abs: 0.5 10*3/uL — ABNORMAL LOW (ref 0.7–4.0)
Monocytes Absolute: 0.2 10*3/uL (ref 0.1–1.0)
Monocytes Relative: 4 % (ref 3–12)
Neutro Abs: 4.5 10*3/uL (ref 1.7–7.7)
RBC: 2.71 MIL/uL — ABNORMAL LOW (ref 3.87–5.11)
WBC: 5.3 10*3/uL (ref 4.0–10.5)

## 2013-07-19 LAB — APTT: aPTT: 47 seconds — ABNORMAL HIGH (ref 24–37)

## 2013-07-19 LAB — PROTIME-INR: INR: 1.87 — ABNORMAL HIGH (ref 0.00–1.49)

## 2013-07-19 MED ORDER — ONDANSETRON HCL 4 MG/2ML IJ SOLN
4.0000 mg | Freq: Once | INTRAMUSCULAR | Status: AC
  Administered 2013-07-20: 4 mg via INTRAVENOUS
  Filled 2013-07-19: qty 2

## 2013-07-19 MED ORDER — SODIUM CHLORIDE 0.9 % IV BOLUS (SEPSIS)
1000.0000 mL | Freq: Once | INTRAVENOUS | Status: AC
  Administered 2013-07-20: 1000 mL via INTRAVENOUS

## 2013-07-19 MED ORDER — MORPHINE SULFATE 4 MG/ML IJ SOLN
4.0000 mg | Freq: Once | INTRAMUSCULAR | Status: AC
  Administered 2013-07-20: 4 mg via INTRAVENOUS
  Filled 2013-07-19: qty 1

## 2013-07-19 NOTE — ED Notes (Addendum)
Reports per EMS. Pt was seen at [facility] over the weekend for her liver disease and was told that her liver levels were "off". EMS called out because the pt was having tremors. EMS reports "She was having tremors to the point that she couldn't finish a sentence, and it looked like she was having a seizure". Pt is also complaining of abdominal pain. Pt received 2.5 mg of Versed and 4 mg of Zofran via EMS.

## 2013-07-19 NOTE — ED Notes (Signed)
Pt called to triage x3 with no response. 

## 2013-07-19 NOTE — ED Provider Notes (Signed)
CSN: [id number]     Arrival date & time 07/19/13  2148 History   First MD Initiated Contact with Patient 07/19/13 2216     Chief Complaint  Patient presents with  . Tremors  . Nausea  . Emesis  . Abdominal Pain   (Consider location/radiation/quality/duration/timing/severity/associated sxs/prior Treatment) HPI [name] is a 27 y.o. female who presented to the emergency department with AMS, vomiting, and tremors.  Started two days ago.  Slowly worsening.  Increasingly fatigued.  No fevers.  Moderate in severity.  Accompanied by grandmother who provides much of history.  Vomiting NBNB.  Still having diarrhea with lactulose treatments.  Mother also reports that patient seems to be slower than average.  Patient sometimes disoriented and not knowing where she is at or what is going on.  Grandmother reports that she just seems to be going downhill.  Past Medical History  Diagnosis Date  . Substance abuse April 2013    Cocaine plus THC usage - delisted from liver transplant list.   . Esophageal varices     distal esophageal grade 1 varices, portal gastropathy on EGDs in 01/2010 and 07/2012:  no banding or other intervention undertaken on EGDs from 2009 - 07/2012.    [name] Cirrhosis of liver   . Autoimmune hepatitis     Confirmed via liver biopsy 2002  . PSC (primary sclerosing cholangitis)   . Seizures     onset in 2009 associated with vent requiring resp failure.   . Thrombocytopenia     associated with cirrhosis.  followed by Dr Alcide Evener  . Intracranial hemorrhage 2009    in association with severe thrombocytopenia   Past Surgical History  Procedure Laterality Date  . Esophagogastroduodenoscopy  07/18/2012    Procedure: ESOPHAGOGASTRODUODENOSCOPY (EGD);  Surgeon: Louis Meckel, MD;  Location: [facility];  Service: Endoscopy;  Laterality: N/A;  . Gastric varices banding  07/18/2012    Procedure: GASTRIC VARICES BANDING;  Surgeon: Louis Meckel, MD;  Location: [facility];   Service: Endoscopy;  Laterality: N/A;  . Ercp  01/2010    Sclerosing Cholangitis with no dominant stricures    Family History  Problem Relation Age of Onset  . Cancer Neg Hx    History  Substance Use Topics  . Smoking status: Former Smoker    Types: Cigarettes    Quit date: 08/09/2005  . Smokeless tobacco: Never Used  . Alcohol Use: No   OB History   Grav Para Term Preterm Abortions TAB SAB Ect Mult Living                 Review of Systems  Unable to perform ROS: Mental status change    Allergies  Ibuprofen  Home Medications   Current Outpatient Rx  Name  Route  Sig  Dispense  Refill  . acetaminophen (TYLENOL) 500 MG tablet   Oral   Take 1,500-2,000 mg by mouth 2 (two) times daily as needed for pain. Normal dose 3 tablets, sometimes takes 4 tablets at night         . acidophilus (RISAQUAD) CAPS capsule   Oral   Take 2 capsules by mouth daily.         [name] EPINEPHrine (EPI-PEN) 0.3 mg/0.3 mL DEVI   Intramuscular   Inject 0.3 mg into the muscle as needed (for allergic reaction).          . fluconazole (DIFLUCAN) 150 MG tablet   Oral   Take 1 tablet (150 mg total) by mouth  once.   1 tablet   0   . furosemide (LASIX) 40 MG tablet   Oral   Take 1 tablet (40 mg total) by mouth daily.   30 tablet   1   . HYDROcodone-acetaminophen (NORCO/VICODIN) 5-325 MG per tablet   Oral   Take 1 tablet by mouth every 6 (six) hours as needed for moderate pain.         . hydrOXYzine (ATARAX/VISTARIL) 25 MG tablet   Oral   Take 50 mg by mouth daily as needed for itching.         . lactulose (CHRONULAC) 10 GM/15ML solution   Oral   Take 20 g by mouth daily as needed (for constipation).         [name] levETIRAcetam (KEPPRA) 750 MG tablet   Oral   Take 750 mg by mouth every 12 (twelve) hours.         . nadolol (CORGARD) 20 MG tablet   Oral   Take 20 mg by mouth at bedtime.          [name] olopatadine (PATANOL) 0.1 % ophthalmic solution   Both Eyes   Place 1 drop  into both eyes 2 (two) times daily.   5 mL   1   . omeprazole (PRILOSEC) 20 MG capsule   Oral   Take 20 mg by mouth daily. 6am         . spironolactone (ALDACTONE) 100 MG tablet   Oral   Take 1 tablet (100 mg total) by mouth daily.   30 tablet   1   . triamcinolone (KENALOG) 0.1 % paste   Mouth/Throat   Use as directed 1 application in the mouth or throat 2 (two) times daily.   5 g   0    BP 120/55  Temp(Src) 98.4 F (36.9 C) (Oral)  Resp 17  SpO2 96% Physical Exam  Nursing note and vitals reviewed. Constitutional: She appears well-developed and well-nourished. No distress.  HENT:  Head: Normocephalic and atraumatic.  Right Ear: External ear normal.  Left Ear: External ear normal.  Nose: Nose normal.  Mouth/Throat: Oropharynx is clear and moist. No oropharyngeal exudate.  Eyes: EOM are normal. Pupils are equal, round, and reactive to light.  Neck: Normal range of motion. Neck supple. No tracheal deviation present.  Cardiovascular: Normal rate.   Pulmonary/Chest: Effort normal and breath sounds normal. No stridor. No respiratory distress. She has no wheezes. She has no rales.  Abdominal: Soft. She exhibits no distension. There is no tenderness. There is no rebound.  Musculoskeletal: Normal range of motion.  Neurological: She is alert. She has normal strength. No cranial nerve deficit. GCS eye subscore is 4. GCS verbal subscore is 4. GCS motor subscore is 6.  Asterixis present Oriented to person only. Exam somewhat motivational.  Skin: Skin is warm and dry. She is not diaphoretic.    ED Course  Procedures (including critical care time) Labs Review Labs Reviewed  CBC WITH DIFFERENTIAL - Abnormal; Notable for the following:    RBC 2.71 (*)    Hemoglobin 9.3 (*)    HCT 25.8 (*)    MCH 34.3 (*)    RDW 16.7 (*)    Platelets 32 (*)    Neutrophils Relative % 85 (*)    Lymphocytes Relative 10 (*)    Lymphs Abs 0.5 (*)    All other components within normal limits   COMPREHENSIVE METABOLIC PANEL - Abnormal; Notable for the following:  Sodium 134 (*)    Glucose, Bld 100 (*)    Calcium 8.1 (*)    Total Protein 5.9 (*)    Albumin 1.7 (*)    AST 211 (*)    ALT 123 (*)    Alkaline Phosphatase 543 (*)    Total Bilirubin 16.2 (*)    All other components within normal limits  APTT - Abnormal; Notable for the following:    aPTT 47 (*)    All other components within normal limits  PROTIME-INR - Abnormal; Notable for the following:    Prothrombin Time 21.0 (*)    INR 1.87 (*)    All other components within normal limits  LIPASE, BLOOD  AMMONIA  ETHANOL  URINALYSIS, ROUTINE W REFLEX MICROSCOPIC  URINE RAPID DRUG SCREEN (HOSP PERFORMED)   Imaging Review No results found.  EKG Interpretation   None       MDM   1. Altered mental status   2. Transaminitis   3. Hyperbilirubinemia   4. Encephalopathy   5. Hypercoagulopathy     [name] is a 27 y.o. female with primary sclerosing cholangitis who presents to the emergency department with worsening disease.  Only focal abnormality on exam is asterixis and disorientation.  Workup initiated indicating hyperbilirubinemia, anemia, thrombocytopenia, transaminitis, and hypercoagulability that are all at or around baseline.  However, on reassessment, patient still too altered for discharge home.  No evidence of infection at this time.  Patient will require admission for further workup and observation.  Family practice consulted for admission.  Patient admitted.    Arloa Koh, MD 07/20/13 [phone]

## 2013-07-19 NOTE — ED Notes (Signed)
I looked in waiting room x2 for patient. Registration claims she saw patient and grandmother get up. Unsure if patient left.

## 2013-07-20 ENCOUNTER — Emergency Department (HOSPITAL_COMMUNITY): Payer: Medicare Other

## 2013-07-20 ENCOUNTER — Inpatient Hospital Stay (HOSPITAL_COMMUNITY): Payer: Medicare Other

## 2013-07-20 ENCOUNTER — Ambulatory Visit: Payer: Medicare Other | Admitting: Oncology

## 2013-07-20 ENCOUNTER — Telehealth: Payer: Self-pay | Admitting: *Deleted

## 2013-07-20 DIAGNOSIS — R4182 Altered mental status, unspecified: Secondary | ICD-10-CM | POA: Diagnosis present

## 2013-07-20 DIAGNOSIS — G9340 Encephalopathy, unspecified: Secondary | ICD-10-CM

## 2013-07-20 LAB — RAPID URINE DRUG SCREEN, HOSP PERFORMED
Amphetamines: NOT DETECTED
Barbiturates: NOT DETECTED
Benzodiazepines: POSITIVE — AB
Cocaine: POSITIVE — AB
Tetrahydrocannabinol: NOT DETECTED

## 2013-07-20 LAB — COMPREHENSIVE METABOLIC PANEL
ALT: 123 U/L — ABNORMAL HIGH (ref 0–35)
AST: 211 U/L — ABNORMAL HIGH (ref 0–37)
AST: 174 U/L — ABNORMAL HIGH (ref 0–37)
Albumin: 1.7 g/dL — ABNORMAL LOW (ref 3.5–5.2)
Albumin: 1.5 g/dL — ABNORMAL LOW (ref 3.5–5.2)
Alkaline Phosphatase: 543 U/L — ABNORMAL HIGH (ref 39–117)
BUN: 15 mg/dL (ref 6–23)
CO2: 22 mEq/L (ref 19–32)
CO2: 17 mEq/L — ABNORMAL LOW (ref 19–32)
Calcium: 8 mg/dL — ABNORMAL LOW (ref 8.4–10.5)
Chloride: 105 mEq/L (ref 96–112)
Chloride: 104 mEq/L (ref 96–112)
Creatinine, Ser: 0.65 mg/dL (ref 0.50–1.10)
Creatinine, Ser: 0.85 mg/dL (ref 0.50–1.10)
GFR calc non Af Amer: >90 mL/min (ref >90)
GFR calc non Af Amer: >90 mL/min (ref >90)
Sodium: 134 mEq/L — ABNORMAL LOW (ref 135–145)
Total Bilirubin: 16.2 mg/dL — ABNORMAL HIGH (ref 0.3–1.2)
Total Bilirubin: 15.4 mg/dL — ABNORMAL HIGH (ref 0.3–1.2)
Total Protein: 5.4 g/dL — ABNORMAL LOW (ref 6.0–8.3)

## 2013-07-20 LAB — URINE MICROSCOPIC-ADD ON

## 2013-07-20 LAB — CBC
HCT: 24.5 % — ABNORMAL LOW (ref 36.0–46.0)
Hemoglobin: 8.5 g/dL — ABNORMAL LOW (ref 12.0–15.0)
MCH: 33.7 pg (ref 26.0–34.0)
MCV: 97.2 fL (ref 78.0–100.0)
Platelets: 31 10*3/uL — ABNORMAL LOW (ref 150–400)
RBC: 2.52 MIL/uL — ABNORMAL LOW (ref 3.87–5.11)
WBC: 7.8 10*3/uL (ref 4.0–10.5)

## 2013-07-20 LAB — URINALYSIS, ROUTINE W REFLEX MICROSCOPIC
Glucose, UA: NEGATIVE mg/dL
Specific Gravity, Urine: 1.027 (ref 1.005–1.030)
Urobilinogen, UA: 1 mg/dL (ref 0.0–1.0)

## 2013-07-20 LAB — LIPASE, BLOOD: Lipase: 16 U/L (ref 11–59)

## 2013-07-20 LAB — MAGNESIUM: Magnesium: 1.8 mg/dL (ref 1.5–2.5)

## 2013-07-20 LAB — AMMONIA
Ammonia: 42 umol/L (ref 11–60)
Ammonia: 54 umol/L (ref 11–60)

## 2013-07-20 MED ORDER — RISAQUAD PO CAPS
2.0000 | ORAL_CAPSULE | Freq: Every day | ORAL | Status: DC
  Administered 2013-07-20 – 2013-07-26 (×7): 2 via ORAL
  Filled 2013-07-20 (×7): qty 2

## 2013-07-20 MED ORDER — NADOLOL 20 MG PO TABS
20.0000 mg | ORAL_TABLET | Freq: Every day | ORAL | Status: DC
  Administered 2013-07-20 – 2013-07-25 (×6): 20 mg via ORAL
  Filled 2013-07-20 (×8): qty 1

## 2013-07-20 MED ORDER — FUROSEMIDE 40 MG PO TABS
40.0000 mg | ORAL_TABLET | Freq: Every day | ORAL | Status: DC
  Administered 2013-07-20 – 2013-07-24 (×5): 40 mg via ORAL
  Filled 2013-07-20 (×5): qty 1

## 2013-07-20 MED ORDER — SODIUM CHLORIDE 0.9 % IJ SOLN: 3.0000 mL | INTRAMUSCULAR | Status: DC | PRN

## 2013-07-20 MED ORDER — ONDANSETRON HCL 4 MG PO TABS
4.0000 mg | ORAL_TABLET | Freq: Four times a day (QID) | ORAL | Status: DC | PRN
  Administered 2013-07-20 – 2013-07-25 (×3): 4 mg via ORAL
  Filled 2013-07-20 (×3): qty 1

## 2013-07-20 MED ORDER — OLOPATADINE HCL 0.1 % OP SOLN
1.0000 [drp] | Freq: Two times a day (BID) | OPHTHALMIC | Status: DC
  Administered 2013-07-20 – 2013-07-26 (×11): 1 [drp] via OPHTHALMIC
  Filled 2013-07-20: qty 5

## 2013-07-20 MED ORDER — PANTOPRAZOLE SODIUM 40 MG PO TBEC
40.0000 mg | DELAYED_RELEASE_TABLET | Freq: Every day | ORAL | Status: DC
  Administered 2013-07-20 – 2013-07-26 (×6): 40 mg via ORAL
  Filled 2013-07-20 (×5): qty 1

## 2013-07-20 MED ORDER — HYDROCODONE-ACETAMINOPHEN 5-325 MG PO TABS
1.0000 | ORAL_TABLET | Freq: Four times a day (QID) | ORAL | Status: DC | PRN
  Filled 2013-07-20: qty 1

## 2013-07-20 MED ORDER — SODIUM CHLORIDE 0.9 % IJ SOLN
3.0000 mL | Freq: Two times a day (BID) | INTRAMUSCULAR | Status: DC
  Administered 2013-07-20 – 2013-07-23 (×3): 3 mL via INTRAVENOUS

## 2013-07-20 MED ORDER — LACTULOSE 10 GM/15ML PO SOLN
20.0000 g | ORAL | Status: AC
  Administered 2013-07-20: 20 g via ORAL
  Filled 2013-07-20: qty 30

## 2013-07-20 MED ORDER — DEXTROSE 5 % IV SOLN
1.0000 g | INTRAVENOUS | Status: DC
  Administered 2013-07-20 – 2013-07-22 (×3): 1 g via INTRAVENOUS
  Filled 2013-07-20 (×4): qty 10

## 2013-07-20 MED ORDER — ACETAMINOPHEN 650 MG RE SUPP: 650.0000 mg | Freq: Four times a day (QID) | RECTAL | Status: DC | PRN

## 2013-07-20 MED ORDER — TRIAMCINOLONE ACETONIDE 0.1 % MT PSTE
1.0000 "application " | PASTE | Freq: Two times a day (BID) | OROMUCOSAL | Status: DC
  Administered 2013-07-26: 1 via OROMUCOSAL
  Filled 2013-07-20 (×2): qty 5

## 2013-07-20 MED ORDER — TRAMADOL HCL 50 MG PO TABS
50.0000 mg | ORAL_TABLET | Freq: Four times a day (QID) | ORAL | Status: DC | PRN
  Administered 2013-07-20 – 2013-07-22 (×7): 50 mg via ORAL
  Filled 2013-07-20 (×8): qty 1

## 2013-07-20 MED ORDER — SODIUM CHLORIDE 0.9 % IJ SOLN
3.0000 mL | Freq: Two times a day (BID) | INTRAMUSCULAR | Status: DC
  Administered 2013-07-20 – 2013-07-25 (×9): 3 mL via INTRAVENOUS

## 2013-07-20 MED ORDER — SPIRONOLACTONE 100 MG PO TABS
100.0000 mg | ORAL_TABLET | Freq: Every day | ORAL | Status: DC
  Administered 2013-07-20 – 2013-07-26 (×7): 100 mg via ORAL
  Filled 2013-07-20 (×7): qty 1

## 2013-07-20 MED ORDER — SODIUM CHLORIDE 0.9 % IV SOLN: 250.0000 mL | INTRAVENOUS | Status: DC | PRN

## 2013-07-20 MED ORDER — MORPHINE SULFATE 2 MG/ML IJ SOLN
1.0000 mg | INTRAMUSCULAR | Status: DC | PRN
  Administered 2013-07-21: 1 mg via INTRAVENOUS
  Filled 2013-07-20: qty 1

## 2013-07-20 MED ORDER — HYDROXYZINE HCL 25 MG PO TABS
50.0000 mg | ORAL_TABLET | Freq: Every day | ORAL | Status: DC | PRN
  Administered 2013-07-20 – 2013-07-24 (×4): 50 mg via ORAL
  Filled 2013-07-20 (×4): qty 2

## 2013-07-20 MED ORDER — LACTULOSE 10 GM/15ML PO SOLN
20.0000 g | Freq: Three times a day (TID) | ORAL | Status: DC
  Administered 2013-07-20 – 2013-07-24 (×12): 20 g via ORAL
  Filled 2013-07-20 (×16): qty 30

## 2013-07-20 MED ORDER — CHLORHEXIDINE GLUCONATE CLOTH 2 % EX PADS
6.0000 | MEDICATED_PAD | Freq: Every day | CUTANEOUS | Status: DC
  Administered 2013-07-20 – 2013-07-22 (×2): 6 via TOPICAL

## 2013-07-20 MED ORDER — PROMETHAZINE HCL 25 MG/ML IJ SOLN
25.0000 mg | Freq: Four times a day (QID) | INTRAMUSCULAR | Status: DC | PRN
  Administered 2013-07-20 – 2013-07-21 (×2): 25 mg via INTRAVENOUS
  Filled 2013-07-20 (×2): qty 1

## 2013-07-20 MED ORDER — LEVETIRACETAM 750 MG PO TABS
750.0000 mg | ORAL_TABLET | Freq: Two times a day (BID) | ORAL | Status: DC
  Administered 2013-07-20 – 2013-07-26 (×13): 750 mg via ORAL
  Filled 2013-07-20 (×15): qty 1

## 2013-07-20 MED ORDER — SODIUM CHLORIDE 0.9 % IV BOLUS (SEPSIS)
1000.0000 mL | Freq: Once | INTRAVENOUS | Status: AC
  Administered 2013-07-20: 1000 mL via INTRAVENOUS

## 2013-07-20 MED ORDER — ONDANSETRON HCL 4 MG/2ML IJ SOLN
4.0000 mg | Freq: Four times a day (QID) | INTRAMUSCULAR | Status: DC | PRN
  Administered 2013-07-20 – 2013-07-25 (×11): 4 mg via INTRAVENOUS
  Filled 2013-07-20 (×13): qty 2

## 2013-07-20 MED ORDER — ACETAMINOPHEN 325 MG PO TABS
650.0000 mg | ORAL_TABLET | Freq: Four times a day (QID) | ORAL | Status: DC | PRN
  Administered 2013-07-20: 650 mg via ORAL
  Filled 2013-07-20: qty 2

## 2013-07-20 NOTE — Progress Notes (Signed)
Nutrition Consult/Brief Note  RD consulted for nutrition status.  Patient reports she's very hungry; anxious to eat lunch.  No recent unintentional weight loss reported.  Likely discharging today.  Wt Readings from Last 10 Encounters:  07/20/13 165 lb 8 oz (75.07 kg)  06/14/13 167 lb 11.2 oz (76.068 kg)  05/16/13 176 lb (79.833 kg)  03/27/13 165 lb 2 oz (74.9 kg)  03/22/13 172 lb (78.019 kg)  02/09/13 170 lb (77.111 kg)  12/26/12 162 lb (73.483 kg)  12/16/12 147 lb 14.9 oz (67.1 kg)  12/08/12 151 lb 9.6 oz (68.765 kg)  11/26/12 149 lb 9.6 oz (67.858 kg)    Body mass index is 32.32 kg/(m^2). Patient meets criteria for Obesity Class I based on current BMI.   Diet advanced to Regular 1047 this AM.  Labs and medications reviewed.   No nutrition interventions warranted at this time. If nutrition issues arise, please consult RD.   Maureen Chatters, RD, LDN Pager #: [phone] After-Hours Pager #: [phone]

## 2013-07-20 NOTE — Telephone Encounter (Signed)
Call from pt's grandmother reporting pt is in [facility] hospital. Will need to reschedule office visit. Admitted for altered mental status. Will make Dr. Truett Perna aware.

## 2013-07-20 NOTE — Progress Notes (Signed)
Update Progress Note  S: pt with worsening nausea, vomiting/retching, and abdominal pain; this has been worsening throughout the day since pt was comfortable appearing this morning; states that pain is 12/10 and points to her xyhpoid area; pain does not radiate but also has milder suprapubic pain; pt notes that she has had withdrawal from drugs before but has not been "this bad"  O: BP 135/60  Pulse 100  Temp(Src) 97.8 F (36.6 C) (Oral)  Resp 18  Ht 5' (1.524 m)  Wt 165 lb 8 oz (75.07 kg)  BMI 32.32 kg/m2  SpO2 100% Gen: chronically ill appearing, mildly distressed during interview and exam but becomes calm and starts falling asleep while I check the computer for lab results Eyes: chronic scleral icterus Mouth: dry mucous membranes CV: 3/6 SEM  Pulm: CTA-B Abd: obese but not distended and no palpable ascites; guarding with attempted palpation of epigastric area; no guarding and no tenderness of lower quadrants; hypoactive bowel sounds  Back: positive CVA tenderness on left, none on right  Skin: well healing incision sites in right groin, lower abdomen, and right neck Neuro: alert and oriented x 4, tremulousness, anxious appearing  A/P: 27 year old F with complex medical scenario including primary sclerosis cholangitis, cirrhosis, liver failure (MELD score 24), polysubstance abuse and seizure disorder who presented with alerted mental status, nausea, and vomiting. Currently, her mental status has cleared to being awake and alert, but still drifts off to sleep easily. Her pain, nausea, vomiting, and tremulousness are worse. Differential diagnosis includes pyelonephritis given appearance of U/A, drug withdrawal given chronic use of cocaine and opiates, ileus vs SBO, viral gastroenteritis. Despite the cirrhosis, there is not physical exam evidence of ascites, so the chance of SBP is lower on my differential.  - Start with NS 1L fluid bolus - Phenergan IV and Morphine 1 mg q 3 PRN for pain and  possible withdrawal - CTX 1 g day for possible pyelonephritis, f/u urine culture - KUB to eval for possible constipation, ileus/SBO - Check NH3 and Mag - Consider abd ultrasound to eval for ascites and also consider pelvic exam  Si Raider. Clinton Sawyer, MD, MBA 07/20/2013, 8:43 PM Family Medicine Resident, PGY-3 [phone] pager

## 2013-07-20 NOTE — ED Provider Notes (Signed)
Medical screening examination/treatment/procedure(s) were conducted as a shared visit with resident-physician practitioner(s) and myself.  I personally evaluated the patient during the encounter.  Pt is a 27 y.o. female with pmhx as above presenting with worsening mental status, emesis, tremors.  Pt has hx of PSC.  Pt found to have stable LFT abnormalities, but clinical exam concerning for worsening hepatic encephalopathy.  On PE. Pt severely jaundiced.  +ttp epigastrium w/o rebound or guarding.  Pt admitted to [facility].    Shanna Cisco, MD 07/20/13 [phone]

## 2013-07-20 NOTE — Progress Notes (Signed)
Received report from ED at 0320 and pt arrived on unit via stretcher bed with ED RN at [phone]. Pt alert and verbally responsive; VS taken; orientation on unite done. Pt sleeping quietly in bed with s/s of distress or discomfort voice or noted. Will continue to monitor quietly.

## 2013-07-20 NOTE — H&P (Signed)
Family Medicine Teaching [facility] Admission History and Physical Service Pager: [phone]  Patient name: [name] Medical record number: [id number] Date of birth: Oct 17, 1985 Age: 27 y.o. Gender: female  Primary Care Provider: Levert Feinstein, MD Consultants: none Code Status: full  Chief Complaint: throwing up and no energy  Assessment and Plan: [name] is a 27 y.o. female presenting with altered mental status . PMH is significant for primary sclerosing cholangitis, substance abuse, cirrhosis, seizures, thrombocytopenia.  # AMS: differential includes hepatic encephalopathy (potentially related to this given her history of PSC and encephalopathy in the past) vs intracranial lesion (unlikely given negative head CT) vs drug use (patient with history of this in the past, UDS yet to be collected, though alcohol level negative) vs infectious process (less likely given patient afebrile and without an elevated WBC). She does not appear acidotic on CMET so unlikely to be a respiratory retention issue. -admit to tele, Attending Dr Mauricio Po -will dose lactulose 20 mg to have 2-3 BMs daily -f/u UDS and UA to determine drug vs infectious process -consider addition of CXR to further evaluate infectious process -f/u mental status after dosing lacutlose -f/u repeat CBC and CMET  # Primary sclerosing cholangitis/cirrhosis: patient followed by GI at [facility] for this issue. Appears that this issue is worsening given up trend in bili and LFTs. Her MELD score is 24. -will trend her CMET -obtain complete US abd -continue home acidophilus -continue home atarax for itching -continue lasix and aldactone for diuresis and ascites -continue nadolol for esophageal varices  # Seizure disorder: continue home keppra  # Thrombocytopenia: appears stable from previously. Most likely related to liver dysfunction, active cocaine, and splenomegaly. -will continue to trend -no heparin for VTE  prophylaxis  FEN/GI: clear liquid diet, SLIV Prophylaxis: SCDs  Disposition: admit to tele, discharge pending resolution of mental status  History of Present Illness: [name] is a 27 y.o. female presenting with altered mental status. Most of the history is provided by the patients grandmother as the patient was intermittently cooperative with the interview. Notes was discharged from [facility] last Friday for a similar episode. Was doing fairly well until Wednesday night when she started to vomit and could not keep her medications down. States had been taking her lactulose since that time. Since the vomiting started she has felt as though she has had no energy. She additionally notes no changes in bowel movements. Notes a frontal headache between her eyes with blurry vision. She endorses some abdominal pain as well. Throughout the interview the patient would drift in and out of sleep. She would wake up when discussing something she wanted (ice chips) and would doze off when discussing the course of her illness.  In review of the discharge summary from [facility], AST and ALT were 296 and 116 respectively on admission with bilirubin to 13.9. These remained stable at discharge. She had a liver US that revealed slow portal venous flow worse than her previous US. She had a transcutaneous biopsy of her liver that revealed cirrhosis with cholestasis suggestive of duct obstruction.  In the ED the patient had a CBC that revealed near baseline Hgb 9.3 and platelets 32. CMET with sodium 134, potassium 4.2, chloride 105, bicarb 22, glucose 100, BUN 15, Cr 0.65, calcium 8.1, albumin 1.7, AST 211, ALT 123, alk phos 543, total bili 16.2. Lipase was normal. Ammonia 42. ETOH negative. CT head without any acute abnormalities.  Review Of Systems: Per HPI with the following additions: none Otherwise 12 point  review of systems was performed and was unremarkable.  Patient Active Problem List   Diagnosis Date Noted  . Tongue  ulceration 06/14/2013  . Vaginal discharge 06/14/2013  . Heart murmur, systolic 03/27/2013  . Sepsis 03/25/2013  . Cocaine abuse 03/24/2013  . Acute encephalopathy 03/24/2013  . Hyperammonemia 03/24/2013  . Other pancytopenia 12/26/2012  . Alcohol abuse 12/26/2012  . Abdominal  pain, other specified site 12/26/2012  . Toxic effect of ethanol 12/16/2012  . Abdominal pain, chronic, right upper quadrant 11/27/2012  . Anemia 10/02/2012  . Bilateral lower abdominal pain 07/23/2012  . Autoimmune hepatitis 06/22/2012  . Ascites 06/22/2012  . 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 Nausea and vomiting in adult 06/21/2011  . Hyponatremia 06/21/2011  . Edema leg 05/26/2011  . Seizure disorder 03/26/2011  . ENCEPHALOPATHY-HEPATIC 09/18/2009  . Portal hypertension 09/18/2009  . INTRACRANIAL HEMORRHAGE 12/05/2007  . OTHER SPECIFIED DISEASE OF HAIR&HAIR FOLLICLES 12/05/2007  . AMENORRHEA 06/29/2007  . THROMBOCYTOPENIA 10/06/2006  . TOBACCO DEPENDENCE 10/06/2006  . ESOPHAGEAL VARICES 10/06/2006  . HEPATIC CIRRHOSIS, NONALCOHOLIC 10/06/2006  . LIVER FAILURE 10/06/2006  . PAPANICOLAOU SMEAR, ABNORMAL 10/06/2006   Past Medical History: Past Medical History  Diagnosis Date  . Substance abuse April 2013    Cocaine plus THC usage - delisted from liver transplant list.   . Esophageal varices     distal esophageal grade 1 varices, portal gastropathy on EGDs in 01/2010 and 07/2012:  no banding or other intervention undertaken on EGDs from 2009 - 07/2012.    [name] Cirrhosis of liver   . Autoimmune hepatitis     Confirmed via liver biopsy 2002  . PSC (primary sclerosing cholangitis)   . Seizures     onset in 2009 associated with vent requiring resp failure.   . Thrombocytopenia     associated with cirrhosis.  followed by Dr Alcide Evener  . Intracranial hemorrhage 2009    in association with severe thrombocytopenia   Past Surgical History: Past Surgical History  Procedure Laterality Date  .  Esophagogastroduodenoscopy  07/18/2012    Procedure: ESOPHAGOGASTRODUODENOSCOPY (EGD);  Surgeon: Louis Meckel, MD;  Location: [facility];  Service: Endoscopy;  Laterality: N/A;  . Gastric varices banding  07/18/2012    Procedure: GASTRIC VARICES BANDING;  Surgeon: Louis Meckel, MD;  Location: [facility];  Service: Endoscopy;  Laterality: N/A;  . Ercp  01/2010    Sclerosing Cholangitis with no dominant stricures    Social History: History  Substance Use Topics  . Smoking status: Former Smoker    Types: Cigarettes    Quit date: 08/09/2005  . Smokeless tobacco: Never Used  . Alcohol Use: No   Additional social history: none  Please also refer to relevant sections of EMR.  Family History: Family History  Problem Relation Age of Onset  . Cancer Neg Hx    Allergies and Medications: Allergies  Allergen Reactions  . Ibuprofen Shortness Of Breath   No current facility-administered medications on file prior to encounter.   Current Outpatient Prescriptions on File Prior to Encounter  Medication Sig Dispense Refill  . acetaminophen (TYLENOL) 500 MG tablet Take 1,500-2,000 mg by mouth 2 (two) times daily as needed for pain. Normal dose 3 tablets, sometimes takes 4 tablets at night      . acidophilus (RISAQUAD) CAPS capsule Take 2 capsules by mouth daily.      [name] EPINEPHrine (EPI-PEN) 0.3 mg/0.3 mL DEVI Inject 0.3 mg into the muscle as needed (for allergic reaction).       [name]  fluconazole (DIFLUCAN) 150 MG tablet Take 1 tablet (150 mg total) by mouth once.  1 tablet  0  . furosemide (LASIX) 40 MG tablet Take 1 tablet (40 mg total) by mouth daily.  30 tablet  1  . HYDROcodone-acetaminophen (NORCO/VICODIN) 5-325 MG per tablet Take 1 tablet by mouth every 6 (six) hours as needed for moderate pain.      . hydrOXYzine (ATARAX/VISTARIL) 25 MG tablet Take 50 mg by mouth daily as needed for itching.      . lactulose (CHRONULAC) 10 GM/15ML solution Take 20 g by mouth daily as needed (for  constipation).      [name] levETIRAcetam (KEPPRA) 750 MG tablet Take 750 mg by mouth every 12 (twelve) hours.      . nadolol (CORGARD) 20 MG tablet Take 20 mg by mouth at bedtime.       [name] olopatadine (PATANOL) 0.1 % ophthalmic solution Place 1 drop into both eyes 2 (two) times daily.  5 mL  1  . omeprazole (PRILOSEC) 20 MG capsule Take 20 mg by mouth daily. 6am      . spironolactone (ALDACTONE) 100 MG tablet Take 1 tablet (100 mg total) by mouth daily.  30 tablet  1  . triamcinolone (KENALOG) 0.1 % paste Use as directed 1 application in the mouth or throat 2 (two) times daily.  5 g  0    Objective: BP 118/49  Pulse 80  Temp(Src) 98.4 F (36.9 C) (Oral)  Resp 20  SpO2 100% Exam: General: NAD, laying in bed HEENT: NCAT, dry MM, scleral ictreus noted Cardiovascular: rrr, 3/6 systolic ejection murmur noted Respiratory: CTAB anteriorly, patient refused to sit up to listen to her back Abdomen: s, tender to palpation in epigastric and RUQ regions, NT elsewhere, ND Extremities: no edema noted Skin: noticeably jaundiced Neuro: patient appears to drift in and out of sleep, notes when I discussed something she wanted to talk about she would wake up, though in discussing what had been going on recently she was not very cooperative, she is oriented to person and place, she is able to follow commands  Labs and Imaging: CBC BMET   Recent Labs Lab 07/19/13 2305  WBC 5.3  HGB 9.3*  HCT 25.8*  PLT 32*    Recent Labs Lab 07/19/13 2305  NA 134*  K 4.2  CL 105  CO2 22  BUN 15  CREATININE 0.65  GLUCOSE 100*  CALCIUM 8.1*     Results for orders placed during the hospital encounter of 07/19/13 (from the past 24 hour(s))  CBC WITH DIFFERENTIAL     Status: Abnormal   Collection Time    07/19/13 11:05 PM      Result Value Range   WBC 5.3  4.0 - 10.5 K/uL   RBC 2.71 (*) 3.87 - 5.11 MIL/uL   Hemoglobin 9.3 (*) 12.0 - 15.0 g/dL   HCT 16.1 (*) 09.6 - 04.5 %   MCV 95.2  78.0 - 100.0 fL   MCH  34.3 (*) 26.0 - 34.0 pg   MCHC 36.0  30.0 - 36.0 g/dL   RDW 40.9 (*) 81.1 - 91.4 %   Platelets 32 (*) 150 - 400 K/uL   Neutrophils Relative % 85 (*) 43 - 77 %   Neutro Abs 4.5  1.7 - 7.7 K/uL   Lymphocytes Relative 10 (*) 12 - 46 %   Lymphs Abs 0.5 (*) 0.7 - 4.0 K/uL   Monocytes Relative 4  3 - 12 %  Monocytes Absolute 0.2  0.1 - 1.0 K/uL   Eosinophils Relative 0  0 - 5 %   Eosinophils Absolute 0.0  0.0 - 0.7 K/uL   Basophils Relative 0  0 - 1 %   Basophils Absolute 0.0  0.0 - 0.1 K/uL  COMPREHENSIVE METABOLIC PANEL     Status: Abnormal   Collection Time    07/19/13 11:05 PM      Result Value Range   Sodium 134 (*) 135 - 145 mEq/L   Potassium 4.2  3.5 - 5.1 mEq/L   Chloride 105  96 - 112 mEq/L   CO2 22  19 - 32 mEq/L   Glucose, Bld 100 (*) 70 - 99 mg/dL   BUN 15  6 - 23 mg/dL   Creatinine, Ser 2.95  0.50 - 1.10 mg/dL   Calcium 8.1 (*) 8.4 - 10.5 mg/dL   Total Protein 5.9 (*) 6.0 - 8.3 g/dL   Albumin 1.7 (*) 3.5 - 5.2 g/dL   AST 621 (*) 0 - 37 U/L   ALT 123 (*) 0 - 35 U/L   Alkaline Phosphatase 543 (*) 39 - 117 U/L   Total Bilirubin 16.2 (*) 0.3 - 1.2 mg/dL   GFR calc non Af Amer >90  >90 mL/min   GFR calc Af Amer >90  >90 mL/min  LIPASE, BLOOD     Status: None   Collection Time    07/19/13 11:05 PM      Result Value Range   Lipase 16  11 - 59 U/L  AMMONIA     Status: None   Collection Time    07/19/13 11:05 PM      Result Value Range   Ammonia 42  11 - 60 umol/L  ETHANOL     Status: None   Collection Time    07/19/13 11:05 PM      Result Value Range   Alcohol, Ethyl (B) <11  0 - 11 mg/dL  APTT     Status: Abnormal   Collection Time    07/19/13 11:05 PM      Result Value Range   aPTT 47 (*) 24 - 37 seconds  PROTIME-INR     Status: Abnormal   Collection Time    07/19/13 11:05 PM      Result Value Range   Prothrombin Time 21.0 (*) 11.6 - 15.2 seconds   INR 1.87 (*) 0.00 - 1.49    Glori Luis, MD 07/20/2013, 1:38 AM PGY-2, [place] [facility] FPTS Intern pager: [phone], text pages welcome

## 2013-07-20 NOTE — Progress Notes (Signed)
I discussed with Dr Williamson.  I agree with their plans documented in their progress note for today. 

## 2013-07-20 NOTE — H&P (Signed)
FMTS Attending Admit Note Patient seen and examined by me, discussed with Dr Birdie Sons and I agree with his assessment and plan. Patient recently hospitalized at [facility] for similar presentation to this admission, when she presents with mental status changes.  This morning she is alert, conversant and easily answers my questions about orientation to place [facility]", "[place]", "4th floor"), and date (MM/DD/YYYY).  She denies pain, shortness of breath, nausea, dysuria, polyuria, chest pain or fevers/chills.  She reports being clean from drugs and alcohol since her last hospitalization at [facility] one week ago.  She reports feeling very hungry and would like a more substantive diet than the clear liquids she is getting, also would like her pain medications reinstated.  Review of her labs and comparison to previous show her thrombocytopenia, aminotransferases, hemoglobin to be at her recent baselines.  She tells me she has an appointment at the [facility] today with Dr Truett Perna to address her thrombocytopenia. A/P: Admitted for reported mental status alteration, which appears to have cleared.  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  Her head CT done on presentation does not explain her presentation.  Mildly elevated ammonia on admission.   Had been receiving lactulose for this at her previous hospitalization.  Will follow up on results of UA and UDS, which do not appear to have been collected.  Paula Compton, MD

## 2013-07-20 NOTE — Progress Notes (Addendum)
Family Medicine Teaching Service Daily Progress Note Intern Pager: [phone]  Patient name: [name] Medical record number: [id number] Date of birth: 1986-03-08 Age: 27 y.o. Gender: female  Primary Care Provider: Levert Feinstein, MD Consultants: None Code Status: Full  Pt Overview and Major Events to Date:  12/12 pt admitted overnight for altered mental status  Assessment and Plan: [name] is a 27yo F presenting with altered mental status.  PMH is significant for primary sclerosing cholangitis, substance abuse, cirrhosis, seizures, and thrombocytopenia.  Altered Mental Status: Most likely due to combination of hepatic encephalopathy and drug use.  She was recently discharged from [facility] 1 week ago with similar complaints and improved substantially with lactulose.  Also, UDS positive for cocaine at admission. Pt admits doing cocaine x  2 this week, no alcohol or other drugs.  Afebrile, although UA positive for ketones, nitrite, and leukocytes.    - dose lactulose 20mg  to have 2-3 BM daily. - UDS positive for cocaine, benzo, opiates; long discussion with patient re substance abuse  - f/u CBC and CMET - f/u urine culture  Primary sclerosing cholangitis/cirrhosis: pt followed by GI at [facility].  MELD score is 24 - trend CMET - continue home acidophilus - continue home atarax for itching - continue lasix and aldactone for diuresis and ascites - continue nadolol for esophageal varices  Seizure disorder: continue home Keppra  Thrombocytopenia: stable from recent labs - continue to trend - no heparin for VTE prophylaxis  FEN/GI: clear liquid diet, SLIV PPx: SCDs  Disposition: likely discharge today based on mental status  Subjective: Pt is tired. Pain in head. Wants to eat. Admits cocaine use x 2 this week. She is very depressed about her current health state and state of her family (grandfather dead, grandmother is advanced COPD, mother with cocaine addiction)   Objective: Temp:   [98.2 F (36.8 C)-98.4 F (36.9 C)] 98.2 F (36.8 C) (12/12 0419) Pulse Rate:  [80-97] 81 (12/12 0419) Resp:  [12-20] 16 (12/12 0419) BP: (109-121)/(48-72) 109/72 mmHg (12/12 0419) SpO2:  [96 %-100 %] 100 % (12/12 0419) Weight:  [165 lb 8 oz (75.07 kg)] 165 lb 8 oz (75.07 kg) (12/12 0419) Physical Exam: General: alert, awake, chronically ill appearing Eyes: scleral icterus Cardiovascular: RRR, 2/6 SEM Respiratory: CTA-B Abdomen: soft, mild distension, no ascites  Extremities: no edeam  Laboratory:  Recent Labs Lab 07/19/13 2305 07/20/13 0618  WBC 5.3 7.8  HGB 9.3* 8.5*  HCT 25.8* 24.5*  PLT 32* 31*    Recent Labs Lab 07/19/13 2305 07/20/13 0618  NA 134* 132*  K 4.2 3.6  CL 105 104  CO2 22 17*  BUN 15 18  CREATININE 0.65 0.85  CALCIUM 8.1* 8.0*  PROT 5.9* 5.4*  BILITOT 16.2* 15.4*  ALKPHOS 543* 481*  ALT 123* 104*  AST 211* 174*  GLUCOSE 100* 147*   12/12 CT of head without contrast FINDINGS:  There is no evidence of acute infarction, mass lesion, or intra- or  extra-axial hemorrhage on CT.  There is stable appearance to chronic encephalomalacia at the  posterior left parietal lobe, likely reflecting remote ischemic  insult.  The posterior fossa, including the cerebellum, brainstem and fourth  ventricle, is within normal limits. The third and lateral  ventricles, and basal ganglia are unremarkable in appearance. No  mass effect or midline shift is seen.  There is no evidence of fracture; visualized osseous structures are  unremarkable in appearance. The orbits are within normal limits. The  paranasal sinuses  and mastoid air cells are well-aerated. No  significant soft tissue abnormalities are seen.  IMPRESSION:  1. No acute intracranial abnormality seen on CT.  2. Stable appearance to chronic encephalomalacia at the posterior  left parietal lobe, likely reflecting remote ischemic insult.     Imaging/Diagnostic Tests:   Si Raider. Terri Sawyer, MD,  [facility] 07/20/2013, 9:47 AM  PGY-3, [facility] Family Medicine FPTS Intern pager: [phone] Amion password: [email or web address], text pages welcome

## 2013-07-20 NOTE — Discharge Summary (Addendum)
Family Medicine Teaching [facility] Discharge Summary  Patient name: [name] Medical record number: [id number] Date of birth: 22-Nov-1985 Age: 27 y.o. Gender: female Date of Admission: 07/19/2013  Date of Discharge: 07/26/2013    Admitting Physician: Barbaraann Barthel, MD  Primary Care Provider: Levert Feinstein, MD Consultants: None  Indication for Hospitalization: Altered Mental Status  Discharge Diagnoses/Problem List:  Altered mental status Primary Sclerosing Cholangitis Cirrhosis Seizure Disorder Thrombocytopenia Urinary Tract Infection  Disposition: discharge to home  Discharge Condition: stable  Brief Hospital Course:   # Altered Mental Status: Pt admitted with altered mental status.  UDS positive for cocaine at admission.  Also, recently admitted to [facility] with similar problem, thought to be secondary to hepatic encephalopathy.  Dosed Lactulose 20mg  to have 2-3 BM daily.  Patient had improvement in mental status with this regimen and once improved she began to refuse this due to not wanting to have frequent BMs.   # Primary Sclerosing Cholangitis/Cirrhosis: Initially continued her home medications. Patient developed increased pitting edema in her lower extremities so diuresis was initiated with IV lasix. She had improvement in her LE edema, though not to baseline and was discharged on PO lasix 80 mg and aldactone 200 mg. Palliative conversation was had with the patient regarding her prognosis and patient is to attend NA for 30 days upon discharge to assist in sobriety. Patient additionally developed worsened itching during hospitalization and was started on cholestyramine and loratadine for this. Pt followed by GI at [facility].  MELD score at admission is 24.  # Severe Nausea and Vomiting - Extensive work up at [facility] last week negative for reversible cause, attributed to biliary and liver disease. Patient had a gastric emptying study that revealed severe gastroparesis, though in Dr  Valorie Roosevelt discussion with the patients GI doctor at [facility] this is less useful given the patietn was on narcotics during the hospitalization. Patients symptoms improved with reglan 10 mg TID with meals.  # Seizure Disorder: Continued home Keppra  # Thrombocytopenia: Stable at baseline.  Seen by hematology in [place].  # Urinary Tract Infection: UA obtained for original work-up positive for ketones, nitrites, and leukocytes. Completed PO course of Keflex.  Discharge Exam: General: alert, awake, chronically ill appearing  Eyes: scleral icterus  Cardiovascular: RRR, 2/6 SEM  Respiratory: CTA-B  Abdomen: soft, mild distension, no ascites  Extremities: 1+ pitting edema in lower extremities  Issues for Follow Up:  1. Follow-up with regularly scheduled Hematology appt. 2. Follow-up with [facility] 3. Compliance with NA and sobriety 4. Compliance with lactulose  Significant Procedures: None  Significant Labs and Imaging:   Recent Labs Lab 07/23/13 0635 07/24/13 1305 07/25/13 0610  WBC 5.0 4.4 3.5*  HGB 8.4* 8.6* 8.1*  HCT 23.8* 24.9* 22.6*  PLT 24* 31* 29*    Recent Labs Lab 07/20/13 2129 07/21/13 0440 07/22/13 0828 07/23/13 0635 07/24/13 1010 07/25/13 0610  NA  --  132* 131* 128* 126* 127*  K  --  3.9 4.2 4.7 4.2 4.4  CL  --  102 102 100 96 98  CO2  --  22 21 19 24 22   GLUCOSE  --  109* 122* 109* 137* 94  BUN  --  15 8 8 11 12   CREATININE  --  0.72 0.71 0.59 0.74 0.72  CALCIUM  --  7.8* 7.9* 8.0* 7.9* 7.5*  MG 1.8  --   --   --   --   --   ALKPHOS  --  460*  468* 475* 488* 433*  AST  --  182* 196* 164* 134* 116*  ALT  --  105* 109* 104* 96* 79*  ALBUMIN  --  1.4* 1.4* 1.4* 1.5* 1.4*   12/12 CT Head without Contrast: FINDINGS:  There is no evidence of acute infarction, mass lesion, or intra- or extra-axial hemorrhage on CT.  There is stable appearance to chronic encephalomalacia at the posterior left parietal lobe, likely reflecting remote ischemic insult.  The  posterior fossa, including the cerebellum, brainstem and fourth ventricle, is within normal limits. The third and lateral  ventricles, and basal ganglia are unremarkable in appearance. No mass effect or midline shift is seen.  There is no evidence of fracture; visualized osseous structures are unremarkable in appearance. The orbits are within normal limits. The paranasal sinuses and mastoid air cells are well-aerated. No significant soft tissue abnormalities are seen.  IMPRESSION:  1. No acute intracranial abnormality seen on CT.  2. Stable appearance to chronic encephalomalacia at the posterior  left parietal lobe, likely reflecting remote ischemic insult.  Outstanding Results: Urine Culture  Discharge Medications:    Medication List    STOP taking these medications       HYDROcodone-acetaminophen 5-325 MG per tablet  Commonly known as:  NORCO/VICODIN      TAKE these medications       acetaminophen 500 MG tablet  Commonly known as:  TYLENOL  Take 1 tablet (500 mg total) by mouth 2 (two) times daily as needed for headache.     acidophilus Caps capsule  Take 2 capsules by mouth daily.     cholestyramine 4 G packet  Commonly known as:  QUESTRAN  Take 1 packet (4 g total) by mouth every 12 (twelve) hours.     desloratadine 5 MG tablet  Commonly known as:  CLARINEX  Take 1 tablet (5 mg total) by mouth daily.     EPINEPHrine 0.3 mg/0.3 mL Devi  Commonly known as:  EPI-PEN  Inject 0.3 mg into the muscle once as needed (severe allergic reaction).     furosemide 40 MG tablet  Commonly known as:  LASIX  Take 2 tablets (80 mg total) by mouth daily.     hydrOXYzine 25 MG tablet  Commonly known as:  ATARAX/VISTARIL  Take 50 mg by mouth daily as needed for itching.     lactulose 10 GM/15ML solution  Commonly known as:  CHRONULAC  Take 30 mLs (20 g total) by mouth 2 (two) times daily as needed (to produce 2-3 bowel movements daily).     levETIRAcetam 750 MG tablet  Commonly  known as:  KEPPRA  Take 750 mg by mouth every 12 (twelve) hours.     metoCLOPramide 10 MG tablet  Commonly known as:  REGLAN  Take 1 tablet (10 mg total) by mouth 3 (three) times daily before meals.     nadolol 20 MG tablet  Commonly known as:  CORGARD  Take 20 mg by mouth at bedtime.     spironolactone 100 MG tablet  Commonly known as:  ALDACTONE  Take 2 tablets (200 mg total) by mouth daily.        Discharge Instructions: Please refer to Patient Instructions section of EMR for full details.  Patient was counseled important signs and symptoms that should prompt return to medical care, changes in medications, dietary instructions, activity restrictions, and follow up appointments.   Follow-Up Appointments: Follow-up Information   Follow up with Levert Feinstein, MD On 07/30/2013. (at 10:45am)  Specialty:  Family Medicine   Contact information:   [address] [place] [address] [phone]       Glori Luis, MD 07/29/2013, 5:35 PM [facility]

## 2013-07-20 NOTE — Progress Notes (Signed)
Pt calls RN and report vomiting in the BR. x1 emesis seen which was mostly food and fluid intake (red from the cherry icee) pt has been drinking. Pt informs RN she want IV pain medication because she like the "feeling" effect it gives her. MD notified and Ok to continue to give pt something to eat and drink. Will continue to monitor.

## 2013-07-21 ENCOUNTER — Encounter (HOSPITAL_COMMUNITY): Payer: Self-pay

## 2013-07-21 DIAGNOSIS — K766 Portal hypertension: Secondary | ICD-10-CM

## 2013-07-21 DIAGNOSIS — D61818 Other pancytopenia: Secondary | ICD-10-CM

## 2013-07-21 DIAGNOSIS — G8929 Other chronic pain: Secondary | ICD-10-CM

## 2013-07-21 DIAGNOSIS — K8309 Other cholangitis: Principal | ICD-10-CM

## 2013-07-21 DIAGNOSIS — R112 Nausea with vomiting, unspecified: Secondary | ICD-10-CM | POA: Diagnosis present

## 2013-07-21 DIAGNOSIS — R1011 Right upper quadrant pain: Secondary | ICD-10-CM

## 2013-07-21 LAB — COMPREHENSIVE METABOLIC PANEL
ALT: 105 U/L — ABNORMAL HIGH (ref 0–35)
AST: 182 U/L — ABNORMAL HIGH (ref 0–37)
CO2: 22 mEq/L (ref 19–32)
Calcium: 7.8 mg/dL — ABNORMAL LOW (ref 8.4–10.5)
Chloride: 102 mEq/L (ref 96–112)
Creatinine, Ser: 0.72 mg/dL (ref 0.50–1.10)
GFR calc Af Amer: >90 mL/min (ref >90)
GFR calc non Af Amer: >90 mL/min (ref >90)
Glucose, Bld: 109 mg/dL — ABNORMAL HIGH (ref 70–99)
Total Bilirubin: 16.3 mg/dL — ABNORMAL HIGH (ref 0.3–1.2)
Total Protein: 5.3 g/dL — ABNORMAL LOW (ref 6.0–8.3)

## 2013-07-21 LAB — URINE CULTURE: Colony Count: 9000

## 2013-07-21 LAB — CBC
Hemoglobin: 8.2 g/dL — ABNORMAL LOW (ref 12.0–15.0)
MCH: 34.3 pg — ABNORMAL HIGH (ref 26.0–34.0)
MCV: 96.2 fL (ref 78.0–100.0)
Platelets: 26 10*3/uL — CL (ref 150–400)
RBC: 2.39 MIL/uL — ABNORMAL LOW (ref 3.87–5.11)
RDW: 16.3 % — ABNORMAL HIGH (ref 11.5–15.5)
WBC: 4.3 10*3/uL (ref 4.0–10.5)

## 2013-07-21 MED ORDER — TRAZODONE HCL 50 MG PO TABS
50.0000 mg | ORAL_TABLET | Freq: Once | ORAL | Status: AC
  Administered 2013-07-21: 50 mg via ORAL
  Filled 2013-07-21: qty 1

## 2013-07-21 MED ORDER — CHOLESTYRAMINE 4 G PO PACK
4.0000 g | PACK | Freq: Two times a day (BID) | ORAL | Status: DC
  Administered 2013-07-21 – 2013-07-26 (×10): 4 g via ORAL
  Filled 2013-07-21 (×11): qty 1

## 2013-07-21 MED ORDER — MORPHINE SULFATE 2 MG/ML IJ SOLN
2.0000 mg | INTRAMUSCULAR | Status: DC | PRN
  Administered 2013-07-21 – 2013-07-24 (×15): 2 mg via INTRAVENOUS
  Filled 2013-07-21 (×15): qty 1

## 2013-07-21 MED ORDER — DIPHENHYDRAMINE-ZINC ACETATE 2-0.1 % EX CREA
TOPICAL_CREAM | Freq: Every day | CUTANEOUS | Status: DC | PRN
  Administered 2013-07-21 – 2013-07-22 (×2): via TOPICAL
  Filled 2013-07-21: qty 28

## 2013-07-21 NOTE — Progress Notes (Signed)
I saw [name].  I discussed with FMTS team.  [name] continues to have nausea and epigastric and RUQ pain. This is a recurrent issue for the patient, often leading to a hospitalization for her autoimmune /Primary Biliary Sclerosis-related nausea/vomiting and epigastric pain.   The patient is interested in consulting with palliative care specialist to discuss home care options for control of her nausea and vomiting and abdominal pain when she has acute symptomatic flares.  She would like to avoid recurrent hospitalizations for just symptom management.   Pt has had extensive work-up of her nausea/vomiting at [facility] recently, including Hepatology consultation.  Pt's symptoms are seen as an expressions of her progressive hepatic disease processes with portal hypertension that are not reversible.  Pt is not a transplant candidate because of her ongoing substance abuse.   Would continue IV morphine and phenergan for now while awaiting Palliative care evaluation and recommendations.

## 2013-07-21 NOTE — Progress Notes (Signed)
CRITICAL VALUE ALERT  Critical value received:  Platelets 26  Date of notification:  07/21/13  Time of notification:  0557  Critical value read back:yes  Nurse who received alert:  Salvadore Oxford, RN   MD notified (1st page):  Dr. Mat Carne  Time of first page:  0602  MD notified (2nd page):  Time of second page:  Responding MD: Dr. Mat Carne  Time MD responded:  [phone]

## 2013-07-21 NOTE — Progress Notes (Signed)
Patient [name]      DOB: March 22, 1986      WUJ:[id number]   Plan for goc meeting 900 am Sunday.  [name] L. [name], MD MBA The Palliative Medicine Team at [facility] Phone: [phone] Pager: [phone]

## 2013-07-21 NOTE — Discharge Summary (Signed)
I discussed with  MS IV Pruette.  I agree with their plans documented in their progress note for today.  

## 2013-07-21 NOTE — Progress Notes (Signed)
Family Medicine Teaching Service Daily Progress Note Intern Pager: [phone]  Patient name: [name] Medical record number: [id number] Date of birth: 26-Apr-1986 Age: 27 y.o. Gender: female  Primary Care Provider: Levert Feinstein, MD Consultants: None Code Status: Full  Pt Overview and Major Events to Date:  12/12 pt admitted overnight for altered mental status, persistent N/V  Assessment and Plan: [name] is a 27yo F presenting with altered mental status.  PMH is significant for primary sclerosing cholangitis, substance abuse, cirrhosis, seizures, and thrombocytopenia.  Altered Mental Status: Resolved; Most likely due to combination of hepatic encephalopathy and drug use.  Pt admits doing cocaine x  2 this week, no alcohol or other drugs.  Afebrile, although UA positive for ketones, nitrite, and leukocytes.    - dose lactulose 20mg  to have 2-3 BM daily. - UDS positive for cocaine, benzo, opiates; long discussion with patient re substance abuse  - f/u CBC and CMET - f/u urine culture  Severe Nausea and Vomiting - Extensive work up at [facility] last week negative for reversible cause, attributed to biliary and liver disease - Cont IV morphine and phenergan PRN - Consult palliative about home strategies to prevent readmission   Primary sclerosing cholangitis/cirrhosis: pt followed by GI at [facility].  MELD score is 24 - trend CMET - continue home acidophilus - continue home atarax for itching - continue lasix and aldactone for diuresis and ascites - continue nadolol for esophageal varices   Seizure disorder: continue home Keppra  Thrombocytopenia: stable from recent labs - continue to trend - no heparin for VTE prophylaxis  FEN/GI: clear liquid diet, SLIV PPx: SCDs  Disposition: likely discharge today based on mental status  Subjective: Pain improved and eating breakfast, no bowel movement  Objective: Temp:  [97.8 F (36.6 C)-98.7 F (37.1 C)] 98.7 F (37.1 C) (12/13  0616) Pulse Rate:  [82-100] 82 (12/13 0616) Resp:  [18] 18 (12/13 0616) BP: (120-135)/(50-61) 120/50 mmHg (12/13 0616) SpO2:  [100 %] 100 % (12/13 0616) Physical Exam: General: alert, awake, chronically ill appearing Eyes: scleral icterus Cardiovascular: RRR, 2/6 SEM Respiratory: CTA-B Abdomen: soft, mild distension, no ascites, mild TTP in RUQ and epigastric area  Extremities: no edeam  Laboratory:  Recent Labs Lab 07/19/13 2305 07/20/13 0618 07/21/13 0440  WBC 5.3 7.8 4.3  HGB 9.3* 8.5* 8.2*  HCT 25.8* 24.5* 23.0*  PLT 32* 31* 26*    Recent Labs Lab 07/19/13 2305 07/20/13 0618 07/21/13 0440  NA 134* 132* 132*  K 4.2 3.6 3.9  CL 105 104 102  CO2 22 17* 22  BUN 15 18 15   CREATININE 0.65 0.85 0.72  CALCIUM 8.1* 8.0* 7.8*  PROT 5.9* 5.4* 5.3*  BILITOT 16.2* 15.4* 16.3*  ALKPHOS 543* 481* 460*  ALT 123* 104* 105*  AST 211* 174* 182*  GLUCOSE 100* 147* 109*   12/12 CT of head without contrast FINDINGS:  There is no evidence of acute infarction, mass lesion, or intra- or  extra-axial hemorrhage on CT.  There is stable appearance to chronic encephalomalacia at the  posterior left parietal lobe, likely reflecting remote ischemic  insult.  The posterior fossa, including the cerebellum, brainstem and fourth  ventricle, is within normal limits. The third and lateral  ventricles, and basal ganglia are unremarkable in appearance. No  mass effect or midline shift is seen.  There is no evidence of fracture; visualized osseous structures are  unremarkable in appearance. The orbits are within normal limits. The  paranasal sinuses and mastoid air  cells are well-aerated. No  significant soft tissue abnormalities are seen.  IMPRESSION:  1. No acute intracranial abnormality seen on CT.  2. Stable appearance to chronic encephalomalacia at the posterior  left parietal lobe, likely reflecting remote ischemic insult.     Imaging/Diagnostic Tests:   Si Raider. Clinton Sawyer,  MD, MBA 07/21/2013, 10:29 AM  PGY-3, [facility] Family Medicine FPTS Intern pager: [phone] Amion password: [email or web address], text pages welcome

## 2013-07-22 DIAGNOSIS — R1115 Cyclical vomiting syndrome unrelated to migraine: Secondary | ICD-10-CM

## 2013-07-22 LAB — CBC
HCT: 23.9 % — ABNORMAL LOW (ref 36.0–46.0)
Hemoglobin: 8.6 g/dL — ABNORMAL LOW (ref 12.0–15.0)
MCV: 96.4 fL (ref 78.0–100.0)
Platelets: 44 10*3/uL — ABNORMAL LOW (ref 150–400)
RBC: 2.48 MIL/uL — ABNORMAL LOW (ref 3.87–5.11)
WBC: 3.6 10*3/uL — ABNORMAL LOW (ref 4.0–10.5)

## 2013-07-22 LAB — COMPREHENSIVE METABOLIC PANEL
ALT: 109 U/L — ABNORMAL HIGH (ref 0–35)
Albumin: 1.4 g/dL — ABNORMAL LOW (ref 3.5–5.2)
BUN: 8 mg/dL (ref 6–23)
CO2: 21 mEq/L (ref 19–32)
Calcium: 7.9 mg/dL — ABNORMAL LOW (ref 8.4–10.5)
Creatinine, Ser: 0.71 mg/dL (ref 0.50–1.10)
GFR calc Af Amer: >90 mL/min (ref >90)
GFR calc non Af Amer: >90 mL/min (ref >90)
Potassium: 4.2 mEq/L (ref 3.5–5.1)
Sodium: 131 mEq/L — ABNORMAL LOW (ref 135–145)
Total Protein: 5.4 g/dL — ABNORMAL LOW (ref 6.0–8.3)

## 2013-07-22 MED ORDER — CAMPHOR-MENTHOL 0.5-0.5 % EX LOTN
TOPICAL_LOTION | CUTANEOUS | Status: DC | PRN
  Administered 2013-07-22 – 2013-07-23 (×2): via TOPICAL
  Filled 2013-07-22 (×2): qty 222

## 2013-07-22 NOTE — Consult Note (Signed)
Patient [name]      DOB: 05-04-1986      WUJ:[id number]     Consult Note from the Palliative Medicine Team at [facility]    Consult Requested by: Dr.  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  Patient expresses her goals as wanting to get better, finish school and go on to have a family.  She is not able to see how her continued drug use prevents this from happening.  She makes excuses for why she uses and why that should be taken into account.  We had an extensive conversation about person responsibility and use of a 12 step program to literally give her life back to her.  She wants to understand her illness but is then in denial about her role in maintaining her health.  Her grandmother was present and is aware of there polysubstance abuse.  We agreed to meet with her primary practioner in the am to further discuss options for her care.    Goals of Care: 1.  Code Status: Full Code   2. Scope of Treatment: Continue curative treatments will Work with Dr. Pollie Meyer to explore her long term options and help her set goals after speaking with her transplant team.  Agree with SW referral for counseling and rehab.  4. Disposition: Home when medically stable   3. Symptom Management:   1. Anxiety/Agitation: patient does very well with CBT 2. Pain: limited opiate use per primary service. Will talk with her primary MD about long term medications 3. Itching : resume sarna lotion . Will talk with her transplant team about other options.  4. 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 Spiritual: not explored, but offered to talk more about this        Patient Documents Completed or Given: Document Given Completed   Advanced Directives Pkt    MOST    DNR    Gone from My Sight    Hard Choices      Brief HPI: 27 yr old with known biliary sclerosis admitted with nausea and vomiting.  Found to be positive for cocaine use.  Were asked to assist with goals of care and related symptom recommendations.   ROS: itching, vomiting as soon as she puts something in her mouth,     PMH:  Past Medical History  Diagnosis Date  . Substance abuse April 2013    Cocaine plus THC usage - delisted from liver transplant list.   . Esophageal varices     distal esophageal grade 1 varices, portal gastropathy on EGDs in 01/2010 and 07/2012:  no banding or other intervention undertaken on EGDs from 2009 - 07/2012.    [name] Cirrhosis of liver   . Autoimmune hepatitis     Confirmed via liver biopsy 2002  . PSC (primary sclerosing cholangitis)   . Seizures     onset in 2009 associated with vent requiring resp failure.   . Thrombocytopenia     associated with cirrhosis.  followed by Dr Alcide Evener  . Intracranial hemorrhage 2009    in association with severe thrombocytopenia     PSH: Past Surgical History  Procedure Laterality Date  . Esophagogastroduodenoscopy  07/18/2012    Procedure: ESOPHAGOGASTRODUODENOSCOPY (EGD);  Surgeon: Molly Maduro  Rosalio Macadamia, MD;  Location: [facility];  Service: Endoscopy;  Laterality: N/A;  . Gastric varices banding  07/18/2012    Procedure: GASTRIC VARICES BANDING;  Surgeon: Louis Meckel, MD;  Location: [facility];  Service: Endoscopy;  Laterality: N/A;  . Ercp  01/2010    Sclerosing Cholangitis with no dominant stricures    I have reviewed the FH and SH and  If appropriate update it with new information. Allergies  Allergen Reactions  . Ibuprofen Shortness Of Breath   Scheduled Meds: . acidophilus  2 capsule Oral Daily  . cefTRIAXone (ROCEPHIN)  IV  1 g Intravenous Q24H  . Chlorhexidine Gluconate Cloth  6 each Topical Q0600  . cholestyramine  4 g Oral Q12H  . furosemide  40 mg Oral  Daily  . lactulose  20 g Oral TID  . levETIRAcetam  750 mg Oral Q12H  . nadolol  20 mg Oral QHS  . olopatadine  1 drop Both Eyes BID  . pantoprazole  40 mg Oral Daily  . sodium chloride  3 mL Intravenous Q12H  . sodium chloride  3 mL Intravenous Q12H  . spironolactone  100 mg Oral Daily  . triamcinolone  1 application Mouth/Throat BID   Continuous Infusions:  PRN Meds:.sodium chloride, camphor-menthol, diphenhydrAMINE-zinc acetate, hydrOXYzine, morphine injection, ondansetron (ZOFRAN) IV, ondansetron, sodium chloride, traMADol    BP 131/78  Pulse 76  Temp(Src) 97.4 F (36.3 C) (Oral)  Resp 18  Ht 5' (1.524 m)  Wt 80.377 kg (177 lb 3.2 oz)  BMI 34.61 kg/m2  SpO2 96%   PPS:50-60%   Intake/Output Summary (Last 24 hours) at 07/22/13 1007 Last data filed at 07/22/13 0600  Gross per 24 hour  Intake    770 ml  Output   1150 ml  Net   -380 ml     Physical Exam:  General: Coherent, able to express her thoughts, itching, otherwise no acute distress HEENT:  PERRL, icteric, mmm  Chest:   Decreased but clear CVS: regular rate and rhythm, S1, S2 Abdomen:obese, with ascites, diffusely tender but no guarding ar rebound Ext: hyperpigmented skin, dry with excoriation from itching Neuro:awake, alert oriented, CNII-X11 intact   Labs: CBC    Component Value Date/Time   WBC 3.6* 07/22/2013 0828   WBC 4.7 08/14/2012 1555   RBC 2.48* 07/22/2013 0828   RBC 2.04* 12/24/2012 0520   RBC 2.81* 08/14/2012 1555   HGB 8.6* 07/22/2013 0828   HGB 11.2* 08/14/2012 1555   HCT 23.9* 07/22/2013 0828   HCT 32.1* 08/14/2012 1555   PLT 44* 07/22/2013 0828   PLT 63* 08/14/2012 1555   MCV 96.4 07/22/2013 0828   MCV 114.2* 08/14/2012 1555   MCH 34.7* 07/22/2013 0828   MCH 39.8* 08/14/2012 1555   MCHC 36.0 07/22/2013 0828   MCHC 34.8 08/14/2012 1555   RDW 16.1* 07/22/2013 0828   RDW 14.5 08/14/2012 1555   LYMPHSABS 0.5* 07/19/2013 2305   LYMPHSABS 0.6* 08/14/2012 1555   MONOABS 0.2 07/19/2013 2305   MONOABS  0.4 08/14/2012 1555   EOSABS 0.0 07/19/2013 2305   EOSABS 0.1 08/14/2012 1555   BASOSABS 0.0 07/19/2013 2305   BASOSABS 0.0 08/14/2012 1555     CMP     Component Value Date/Time   NA 131* 07/22/2013 0828   NA 133* 08/07/2012 1422   K 4.2 07/22/2013 0828   K 3.9 08/07/2012 1422   CL 102 07/22/2013 0828   CL 100 08/07/2012 1422   CO2 21 07/22/2013 1610  CO2 24 08/07/2012 1422   GLUCOSE 122* 07/22/2013 0828   GLUCOSE 224* 08/07/2012 1422   BUN 8 07/22/2013 0828   BUN 10.0 08/07/2012 1422   CREATININE 0.71 07/22/2013 0828   CREATININE 0.63 03/22/2013 1416   CREATININE 0.7 08/07/2012 1422   CALCIUM 7.9* 07/22/2013 0828   CALCIUM 7.9* 08/07/2012 1422   PROT 5.4* 07/22/2013 0828   PROT 5.5* 06/20/2012 1212   ALBUMIN 1.4* 07/22/2013 0828   ALBUMIN 1.5* 06/20/2012 1212   AST 196* 07/22/2013 0828   AST 239* 06/20/2012 1212   ALT 109* 07/22/2013 0828   ALT 67* 06/20/2012 1212   ALKPHOS 468* 07/22/2013 0828   ALKPHOS 1,104* 06/20/2012 1212   BILITOT 16.1* 07/22/2013 0828   BILITOT 17.47* 06/20/2012 1212   GFRNONAA >90 07/22/2013 0828   GFRAA >90 07/22/2013 0828    Chest Xray Reviewed/Impressions: Unremarkable bowel gas pattern; no free intra-abdominal air seen.       Time In Time Out Total Time Spent with Patient Total Overall Time  900 am 950 am 50 min 50 min    Greater than 50%  of this time was spent counseling and coordinating care related to the above assessment and plan.  Zigmund Linse L. [name], MD MBA The Palliative Medicine Team at [facility] Phone: [phone] Pager: [phone]

## 2013-07-22 NOTE — Progress Notes (Signed)
Called radiology about gastric emptying study. I asked the technician if a patient needed to be NPO after midnight for procedure and they said they did not know. They said IR preforms the procedure and that they were not here today with it being a Sunday, so they could not ask them. They said to make patient NPO after midnight just in case that is needed for exam and to have Monday's nurse call IR first thing in the morning to see if patient needs to be NPO at breakfast or if they can eat. Will pass this info along.

## 2013-07-22 NOTE — Progress Notes (Signed)
I have seen and examined this patient. I have discussed with Dr Durene Cal.  I agree with their findings and plans as documented in their progress note.  Patient continues to complain of nausea and itching.   Pt agreed to gastric emptying study (had been ordered by Dr Arlyce Dice (GI) in March 2014 but pt became ill and was unable to perform the test). If delayed emptying is present then starting Reglan would be appropriate or the use of  erythromycin (promotilin effect) maybe helpful.   Recommend contact with patient's hepatologist at [facility] (see Care Everywhere) to discuss options for treating patient's cholestatic pruritus. This symptom is significantly affecting the patient's quality of life.  Options such as mirtazipine,Ursodeoxycholic acid, UV-B Phototherapy are possible palliative interventions.  I would like to get her Hepatologist's recommendations for this symptom, if possible. [name]

## 2013-07-22 NOTE — Consult Note (Signed)
Patient ZO:XWRUEAV [name]      DOB: 1985/09/14      WUJ:[id number]   Summary of Goals of care; full note to follow"  Discussed need to become involved in AA or NA, and pursue sponsor and counselor.  Agree with social worker to give information and resources for stopping drugs and alcohol.  Reinforced that the ultimate price to pay will be death in most gentle but straightforward way.  Encouraged her to make a list of the long term goals that would follow successful liver transplantation and look at it every day. Example: with a liver transplant  I will have my strength back, with a liver transplant I may not have as many dietary restrictions....etc.   With regards to symptom management: itching add sarna lotion to current regime. With regard to N and V .  Keep food journal and eat those foods that don't exacerbate.  Consider scheduled meds before meals.  Avoid drug abuse which and cause withdraw. Will speak with the patient's primary physician in the am regarding pain management.   Total time 50 min  Terri Krisher L. [name], MD MBA The Palliative Medicine Team at [facility] Phone: [phone] Pager: [phone]

## 2013-07-22 NOTE — Progress Notes (Signed)
Family Medicine Teaching Service Daily Progress Note Intern Pager: [phone]  Patient name: [name] Medical record number: [id number] Date of birth: Nov 20, 1985 Age: 27 y.o. Gender: female  Primary Care Provider: Levert Feinstein, MD Consultants: None Code Status: Full  Pt Overview and Major Events to Date:  12/12 pt admitted overnight for altered mental status, persistent N/V  Assessment and Plan: [name] is a 27yo F presenting with altered mental status.  PMH is significant for primary sclerosing cholangitis, substance abuse, cirrhosis, seizures, and thrombocytopenia.  Altered Mental Status: Resolved; Most likely due to combination of hepatic encephalopathy and drug use.  Pt admits doing cocaine x  2 this week, no alcohol or other drugs.  Afebrile, although UA positive for ketones, nitrite, and leukocytes.  Ucx-insignificant growth.  - dose lactulose 20mg  to have 2-3 BM daily (patient refused dose last night, will monitor today to ensure patient gets doses. No BM recorded yesterday) - UDS positive for cocaine, benzo, opiates; long discussion with patient re substance abuse   -will have patient meet with SW hopefully today  Primary sclerosing cholangitis/cirrhosis: pt followed by GI at [facility].  MELD score is 24 - trend CMET - continue home acidophilus - continue home atarax for itching as well as cholestyramine.   -await palliative recommendations - continue lasix and aldactone for diuresis and ascites - continue nadolol for esophageal varices  -? Plan for medication at discharge due to risk with cocaine use  Severe Nausea and Vomiting - Extensive work up at [facility] last week negative for reversible cause, attributed to biliary and liver disease - Cont IV morphine and phenergan PRN - Meeting with palliative this AM about home strategies to prevent readmission   Seizure disorder: continue home Keppra  Thrombocytopenia: stable from recent labs - continue to trend - no heparin for  VTE prophylaxis  FEN/GI: regular diet, SLIV PPx: SCDs  Disposition: possible d/c today vs. Tomorrow pending palliative recs and meeting with sw. ? If needs BM.   Subjective: Refused lactulose last night. Comfortable in bed this AM. Still with some pain.   Objective: Temp:  [97.4 F (36.3 C)-99.4 F (37.4 C)] 97.4 F (36.3 C) (12/14 0557) Pulse Rate:  [76-98] 76 (12/14 0557) Resp:  [18] 18 (12/14 0557) BP: (116-131)/(53-78) 131/78 mmHg (12/14 0557) SpO2:  [96 %-100 %] 96 % (12/14 0557) Weight:  [177 lb 3.2 oz (80.377 kg)] 177 lb 3.2 oz (80.377 kg) (12/14 0500) Physical Exam: General: alert, awake, chronically ill appearing Eyes: scleral icterus Cardiovascular: RRR, 2/6 SEM Respiratory: CTA-B Abdomen: soft, mild distension, no ascites, mild TTP in RUQ and epigastric area  Extremities: no edema  Laboratory:  Recent Labs Lab 07/20/13 0618 07/21/13 0440 07/22/13 0828  WBC 7.8 4.3 3.6*  HGB 8.5* 8.2* 8.6*  HCT 24.5* 23.0* 23.9*  PLT 31* 26* 44*    Recent Labs Lab 07/19/13 2305 07/20/13 0618 07/21/13 0440  NA 134* 132* 132*  K 4.2 3.6 3.9  CL 105 104 102  CO2 22 17* 22  BUN 15 18 15   CREATININE 0.65 0.85 0.72  CALCIUM 8.1* 8.0* 7.8*  PROT 5.9* 5.4* 5.3*  BILITOT 16.2* 15.4* 16.3*  ALKPHOS 543* 481* 460*  ALT 123* 104* 105*  AST 211* 174* 182*  GLUCOSE 100* 147* 109*   Imaging/Diagnostic Tests: 12/12 CT of head without contrast FINDINGS:  There is no evidence of acute infarction, mass lesion, or intra- or  extra-axial hemorrhage on CT.  There is stable appearance to chronic encephalomalacia at the  posterior left parietal lobe, likely reflecting remote ischemic  insult.  The posterior fossa, including the cerebellum, brainstem and fourth  ventricle, is within normal limits. The third and lateral  ventricles, and basal ganglia are unremarkable in appearance. No  mass effect or midline shift is seen.  There is no evidence of fracture; visualized osseous  structures are  unremarkable in appearance. The orbits are within normal limits. The  paranasal sinuses and mastoid air cells are well-aerated. No  significant soft tissue abnormalities are seen.  IMPRESSION:  1. No acute intracranial abnormality seen on CT.  2. Stable appearance to chronic encephalomalacia at the posterior  left parietal lobe, likely reflecting remote ischemic insult.   Aldine Contes. Marti Sleigh, MD, PGY3 07/22/2013 9:23 AM PGY-3, [facility] FPTS Intern pager: [phone] Amion password: [email or web address], text pages welcome

## 2013-07-23 ENCOUNTER — Inpatient Hospital Stay (HOSPITAL_COMMUNITY): Payer: Medicare Other

## 2013-07-23 DIAGNOSIS — D696 Thrombocytopenia, unspecified: Secondary | ICD-10-CM

## 2013-07-23 DIAGNOSIS — K754 Autoimmune hepatitis: Secondary | ICD-10-CM

## 2013-07-23 DIAGNOSIS — L299 Pruritus, unspecified: Secondary | ICD-10-CM

## 2013-07-23 LAB — CBC
HCT: 23.8 % — ABNORMAL LOW (ref 36.0–46.0)
Hemoglobin: 8.4 g/dL — ABNORMAL LOW (ref 12.0–15.0)
MCH: 34.3 pg — ABNORMAL HIGH (ref 26.0–34.0)
MCHC: 35.3 g/dL (ref 30.0–36.0)
RBC: 2.45 MIL/uL — ABNORMAL LOW (ref 3.87–5.11)
RDW: 15.9 % — ABNORMAL HIGH (ref 11.5–15.5)

## 2013-07-23 LAB — COMPREHENSIVE METABOLIC PANEL
Alkaline Phosphatase: 475 U/L — ABNORMAL HIGH (ref 39–117)
BUN: 8 mg/dL (ref 6–23)
CO2: 19 mEq/L (ref 19–32)
Calcium: 8 mg/dL — ABNORMAL LOW (ref 8.4–10.5)
GFR calc Af Amer: >90 mL/min (ref >90)
GFR calc non Af Amer: >90 mL/min (ref >90)
Glucose, Bld: 109 mg/dL — ABNORMAL HIGH (ref 70–99)
Potassium: 4.7 mEq/L (ref 3.5–5.1)
Total Protein: 5.3 g/dL — ABNORMAL LOW (ref 6.0–8.3)

## 2013-07-23 MED ORDER — METOCLOPRAMIDE HCL 10 MG PO TABS
10.0000 mg | ORAL_TABLET | Freq: Three times a day (TID) | ORAL | Status: DC
  Administered 2013-07-23 – 2013-07-26 (×9): 10 mg via ORAL
  Filled 2013-07-23 (×11): qty 1

## 2013-07-23 MED ORDER — TECHNETIUM TC 99M SULFUR COLLOID
2.0000 | Freq: Once | INTRAVENOUS | Status: AC | PRN
  Administered 2013-07-23: 2 via INTRAVENOUS

## 2013-07-23 MED ORDER — FUROSEMIDE 10 MG/ML IJ SOLN
40.0000 mg | Freq: Two times a day (BID) | INTRAMUSCULAR | Status: AC
  Administered 2013-07-23 – 2013-07-24 (×2): 40 mg via INTRAVENOUS
  Filled 2013-07-23 (×2): qty 4

## 2013-07-23 NOTE — Progress Notes (Signed)
Palliative Medicine Team Progress Note  I called and spoke at length with pt's primary hepatologist at [facility], Dr. Woodfin Ganja. Dr. Piedad Climes knows Mccayla very well, having taken care of her for many years. I also happen to be Rodina's PCP, although have only seen her in clinic one time previously.  My primary inquiry of Dr. Piedad Climes was whether Kiya would ever be considered for transplant, if she were somehow able to stop abusing cocaine and other substances. It seems to answer this question is that yes, if she were able to stop using cocaine for a designated period of sobriety, that she could be considered for transplant. In her current state of illness with a MELD score of ~20 when she is medically at her worst, she would be unlikely to receive a transplant soon even if listed. Her most recent liver biopsy did not show any evidence of alcoholic liver disease, which is fortunate as it could potentially hurt her chances for transplant in the future if she had evidence of alcoholic liver disease.  Cocaine has been the most difficult substance for Geonna to stop. She has also abused alcohol and marijuana in the past and was able to stop using these, but still struggles with frequent cocaine use. Dr. Piedad Climes has recommended counseling to her in the past, even given her a list of counselors in [place]. Cocaine use has been a chronic issue for Miana, apparently even since her early teenage years. Both of her parents are drug addicts and thus her primary source of support is her grandmother, who is a strongly positive presence in Shada's life. Dr. Piedad Climes believes that Alliana's best chance at recovery from her cocaine addiction would be a 30 day in patient program. If she were to pursue this, [facility] may be the best place for her. She also thinks it is worth exploring with pt whether she has any depression or suicidality contributing to her substance abuse.  From a medical standpoint, her PSC has  progressed. Whereas she has been fairly stable previously, she is now having manifestations of decompensated cirrhosis, with fluid/electrolyte disturbances and encephalopathy. Dr. Piedad Climes feels that pt has a lot of reserve due to her young age and lack of comorbidities, and that she will not likely have a rapid decline. We did discuss Nawaal's significantly delayed gastric emptying on her study today. After reviewing her MAR, Zhania has been getting IV morphine here in the hospital, which limits the validity of the study. Dr. Piedad Climes feels it is possible that pt could have vasculitic delayed emptying.  I also discussed symptom management options with Dr. Piedad Climes. Rifaximin is a potential option for Blayne's hepatic encephalopathy as an outpatient, but in the acute setting lactulose is the best treatment. Regarding ursodiol for pruritis, Dr. Piedad Climes advised that ursodiol is contraindicated in [facility] at high doses due to worsening of portal hypertension, but a low dose for pruritis would be acceptable.  The palliative medicine team will meet again with Merriel and her grandmother tomorrow morning, 07/24/13 at 9:00am to discuss her overall course and long term goals.  Levert Feinstein, MD Family Medicine PGY-2  Palliative Medicine Service Resident

## 2013-07-23 NOTE — Progress Notes (Signed)
CRITICAL VALUE ALERT  Critical value received:  Platelet 24  Date of notification:  07/23/2013  Time of notification:  0720  Critical value read back:yes  Nurse who received alert:  Trevor Iha, RN  MD notified (1st page): Yes  Time of first page:  [phone]  MD notified (2nd page):  Time of second page:  Responding MD:  MD on-call  Time MD responded:  [phone]

## 2013-07-23 NOTE — Progress Notes (Signed)
MD requested patient to have bladder scan, since pt is complaining of not making enough urine though on diuretics. Result of the scanner was communicated to MD. Will continue to monitor.

## 2013-07-23 NOTE — Progress Notes (Signed)
Came back from procedure stable. No new set of complaints. Will keep monitoring.

## 2013-07-23 NOTE — Progress Notes (Signed)
Spoke with pt this afternoon regarding gastric emptying study.  Explained she has severe gastroparesis with 69% food retention 2 hours after eating.  Spoke to pt about eating liquid or pureed foods.  Also, decided to try Reglan 10mg  TID before meals.    Additionally, pt complains of significant swelling in her thighs.  There is 3+ pitting edema bilaterally in her upper thighs.  Prescribed 40mg  Lasix x2 IV to help alleviate her fluid overload.   Henderson Baltimore, MS IV [facility], FPTS 07/23/2013  Si Raider. Clinton Sawyer, MD, MBA 07/23/2013, 7:35 PM Family Medicine Resident, PGY-3

## 2013-07-23 NOTE — Progress Notes (Signed)
FMTS Attending Note Patient seen and examined by me, discussed with resident team and I agree with their assessment and plan per this note.  Patient with delayed gastric emptying on study.  Plan to discuss with her hepatologist at [facility] regarding preferred management of this.  Appreciate Palliative team input into trying to set goals of care. Paula Compton, MD

## 2013-07-23 NOTE — Progress Notes (Signed)
Family Medicine Teaching Service Daily Progress Note Intern Pager: [phone]  Patient name: [name] Medical record number: [id number] Date of birth: Nov 23, 1985 Age: 27 y.o. Gender: female  Primary Care Provider: Levert Feinstein, MD Consultants: None Code Status: Full  Pt Overview and Major Events to Date:  12/12 pt admitted overnight for altered mental status, persistent N/V 12/15 Swallowing study  Assessment and Plan: [name] is a 26yo F presenting with altered mental status.  PMH is significant for primary sclerosing cholangitis, substance abuse, cirrhosis, seizures, and thrombocytopenia.  Altered Mental Status: Resolved; Most likely due to combination of hepatic encephalopathy and drug use.  Pt admits doing cocaine x  2 this week, no alcohol or other drugs.  Afebrile, although UA positive for ketones, nitrite, and leukocytes.  Ucx-insignificant growth.  - dose lactulose 20mg  to have 2-3 BM daily (patient refused dose last night, will monitor today to ensure patient gets doses. No BM recorded yesterday) - UDS positive for cocaine, benzo, opiates; long discussion with patient re substance abuse   -will have patient meet with SW hopefully today  Primary sclerosing cholangitis/cirrhosis: pt followed by GI at [facility].  MELD score is 24 - trend CMET - continue home acidophilus - continue home atarax for itching as well as cholestyramine.  - Pt reports good result from itching with Sarna lotion - continue lasix and aldactone for diuresis and ascites - continue nadolol for esophageal varices   Severe Nausea and Vomiting - Extensive work up at [facility] last week negative for reversible cause, attributed to biliary and liver disease - Cont IV morphine and phenergan PRN - Gastric emptying study today - Meeting with palliative this AM about home strategies to prevent readmission   Lower abdominal fullness: Pt began complaining of lower abdominal fullness and decreased urine output -  bladder scan to assess for retention  Seizure disorder: continue home Keppra  Thrombocytopenia: stable from recent labs - continue to trend - no heparin for VTE prophylaxis  FEN/GI: regular diet, SLIV PPx: SCDs  Disposition: possible d/c today vs. Tomorrow pending palliative recs and meeting with sw. ? If needs BM.   Subjective: Pt lying in bed, complaining of some pain in lower abdomen.  Also complains of decreased urine output.  She is complaining of dry mouth as well, which is exacerbated by the fact that she is NPO for gastric emptying study.  Otherwise no complaints.    Objective: Temp:  [97.4 F (36.3 C)-98.2 F (36.8 C)] 97.4 F (36.3 C) (12/15 0647) Pulse Rate:  [84-102] 86 (12/15 0647) Resp:  [18-20] 19 (12/15 0647) BP: (104-132)/(42-97) 104/42 mmHg (12/15 0647) SpO2:  [100 %] 100 % (12/15 0647) Weight:  [177 lb 4.8 oz (80.423 kg)] 177 lb 4.8 oz (80.423 kg) (12/15 0500) Physical Exam: General: alert, awake, chronically ill appearing Eyes: scleral icterus Cardiovascular: RRR, 2/6 SEM Respiratory: CTA-B Abdomen: soft, mild distension, no ascites, mild TTP center, lower abdomen   Extremities: no edema  Laboratory:  Recent Labs Lab 07/21/13 0440 07/22/13 0828 07/23/13 0635  WBC 4.3 3.6* 5.0  HGB 8.2* 8.6* 8.4*  HCT 23.0* 23.9* 23.8*  PLT 26* 44* 24*    Recent Labs Lab 07/21/13 0440 07/22/13 0828 07/23/13 0635  NA 132* 131* 128*  K 3.9 4.2 4.7  CL 102 102 100  CO2 22 21 19   BUN 15 8 8   CREATININE 0.72 0.71 0.59  CALCIUM 7.8* 7.9* 8.0*  PROT 5.3* 5.4* 5.3*  BILITOT 16.3* 16.1* 16.9*  ALKPHOS 460* 468* 475*  ALT 105* 109* 104*  AST 182* 196* 164*  GLUCOSE 109* 122* 109*   Imaging/Diagnostic Tests: 12/12 CT of head without contrast FINDINGS:  There is no evidence of acute infarction, mass lesion, or intra- or  extra-axial hemorrhage on CT.  There is stable appearance to chronic encephalomalacia at the  posterior left parietal lobe, likely  reflecting remote ischemic  insult.  The posterior fossa, including the cerebellum, brainstem and fourth  ventricle, is within normal limits. The third and lateral  ventricles, and basal ganglia are unremarkable in appearance. No  mass effect or midline shift is seen.  There is no evidence of fracture; visualized osseous structures are  unremarkable in appearance. The orbits are within normal limits. The  paranasal sinuses and mastoid air cells are well-aerated. No  significant soft tissue abnormalities are seen.  IMPRESSION:  1. No acute intracranial abnormality seen on CT.  2. Stable appearance to chronic encephalomalacia at the posterior  left parietal lobe, likely reflecting remote ischemic insult.   Henderson Baltimore, MS IV  07/23/2013 9:22 AM [place] [facility] FPTS Intern pager: [phone] Amion password: [email or web address], text pages welcome   Si Raider. Clinton Sawyer, MD, MBA 07/23/2013, 1:14 PM Family Medicine Resident, PGY-3

## 2013-07-24 ENCOUNTER — Other Ambulatory Visit: Payer: Self-pay | Admitting: *Deleted

## 2013-07-24 DIAGNOSIS — Z515 Encounter for palliative care: Secondary | ICD-10-CM

## 2013-07-24 DIAGNOSIS — R109 Unspecified abdominal pain: Secondary | ICD-10-CM

## 2013-07-24 DIAGNOSIS — K7460 Unspecified cirrhosis of liver: Secondary | ICD-10-CM

## 2013-07-24 DIAGNOSIS — F1410 Cocaine abuse, uncomplicated: Secondary | ICD-10-CM

## 2013-07-24 LAB — COMPREHENSIVE METABOLIC PANEL
ALT: 96 U/L — ABNORMAL HIGH (ref 0–35)
AST: 134 U/L — ABNORMAL HIGH (ref 0–37)
Albumin: 1.5 g/dL — ABNORMAL LOW (ref 3.5–5.2)
CO2: 24 mEq/L (ref 19–32)
Calcium: 7.9 mg/dL — ABNORMAL LOW (ref 8.4–10.5)
Creatinine, Ser: 0.74 mg/dL (ref 0.50–1.10)
GFR calc non Af Amer: >90 mL/min (ref >90)
Potassium: 4.2 mEq/L (ref 3.5–5.1)
Sodium: 126 mEq/L — ABNORMAL LOW (ref 135–145)

## 2013-07-24 LAB — CBC
HCT: 24.9 % — ABNORMAL LOW (ref 36.0–46.0)
Hemoglobin: 8.6 g/dL — ABNORMAL LOW (ref 12.0–15.0)
MCH: 33.9 pg (ref 26.0–34.0)
MCHC: 34.5 g/dL (ref 30.0–36.0)
MCV: 98 fL (ref 78.0–100.0)
RBC: 2.54 MIL/uL — ABNORMAL LOW (ref 3.87–5.11)
RDW: 16.3 % — ABNORMAL HIGH (ref 11.5–15.5)

## 2013-07-24 MED ORDER — MORPHINE SULFATE 2 MG/ML IJ SOLN
1.0000 mg | INTRAMUSCULAR | Status: DC | PRN
  Administered 2013-07-24 – 2013-07-25 (×3): 1 mg via INTRAVENOUS
  Filled 2013-07-24 (×4): qty 1

## 2013-07-24 MED ORDER — LACTULOSE 10 GM/15ML PO SOLN
30.0000 g | Freq: Three times a day (TID) | ORAL | Status: DC
  Administered 2013-07-24: 30 g via ORAL
  Administered 2013-07-24: 20 g via ORAL
  Administered 2013-07-25 – 2013-07-26 (×2): 30 g via ORAL
  Filled 2013-07-24 (×8): qty 45

## 2013-07-24 MED ORDER — HYDROXYZINE HCL 25 MG PO TABS: 50.0000 mg | ORAL_TABLET | Freq: Three times a day (TID) | ORAL | Status: DC | PRN

## 2013-07-24 MED ORDER — FUROSEMIDE 10 MG/ML IJ SOLN
40.0000 mg | Freq: Two times a day (BID) | INTRAMUSCULAR | Status: AC
  Administered 2013-07-24 – 2013-07-25 (×2): 40 mg via INTRAVENOUS
  Filled 2013-07-24 (×2): qty 4

## 2013-07-24 MED ORDER — LORATADINE 5 MG/5ML PO SYRP
10.0000 mg | ORAL_SOLUTION | Freq: Every day | ORAL | Status: DC
  Administered 2013-07-24 – 2013-07-26 (×3): 10 mg via ORAL
  Filled 2013-07-24 (×4): qty 10

## 2013-07-24 MED ORDER — CETIRIZINE HCL 5 MG/5ML PO SYRP: 10.0000 mg | ORAL_SOLUTION | Freq: Every day | ORAL | Status: DC

## 2013-07-24 NOTE — Progress Notes (Signed)
FMTS Attending Note Patient's care discussed with resident team and I agree with assessment and plan as per Dr Purvis Sheffield note.  Paula Compton, MD

## 2013-07-24 NOTE — Progress Notes (Signed)
Met with Amyriah with Dr. Pollie Meyer.  I agree with this excellent summary of goals for patient and the interaction that is developing between patient and Doctor.  [place] will benefit most from intensive counseling and participation in [place].  She will thrive with frequent clinic visits to reset goals.  Elsey has chronic abdomen pain which will be difficult to control as she is not a reliable candidate for outpatient medications but has intermittent pain and a large psychosocial overlay.  She has a history of seizures so the tramadol should be stopped.  She has access to hydrocodone from [facility] .  Would recommend at this time decreasing the morphine she has access to to 1 mg q 4 hours and ultimately discontinue .  Discssed with Dr. Pollie Meyer.  Limiting patient to one prescriber for narcotics will be helpful. With regard to itching this has improved with sarna in addition to her other meds see note from Dr. Pollie Meyer with other recommendations for the patient's [facility] doctors.  We explained the importance of lactulose in her care and how that works to help with headaches and itching.   Total time: 915 am -1020 am  Rihan Schueler L. [name], MD MBA The Palliative Medicine Team at [facility] Phone: [phone] Pager: [phone]

## 2013-07-24 NOTE — Progress Notes (Signed)
Palliative Medicine Team Progress Note  Dr. [name], [name], myself, and pt's grandmother [name] all met today at bedside to discuss [name]'s illness and her plans for moving forward. We had a thorough discussion of what needs to happen in order for her to do well, and what will need to take place prior to her being listed on the liver transplant list.  Globally, [name] understands on some level that if she does not stop using drugs and if her illness continues to progress, that this disease will eventually take her life. She became tearful and somewhat withdrawn when we came to this part of the conversation, but did open up some. She shared that she does not like talking about the topic and her reaction to the thought that she could die is to "not care". She voices ongoing difficulty with her mother as being a source of stress and frustration. She does seem to realize the potential role that being in counseling could play in helping her process some of these feelings. She states her understanding of the need for behavioral change and seems to be relatively motivated to change moving forward.  Specific steps Cathey identified that she is planning to take include: 1. Stopping cocaine use - she will do this by attending narcotics anonymous on a daily basis for at least 30 days. She also is getting a new phone and will thus not have all the phone numbers for her prior contacts. She understands that she would need to be cocaine free for at least 6 months before being eligible to be listed for transplant, although we had a frank discussion that this will need to be a lifelong change for her in order to stay well post-transplant and to be a good steward of the organ she would be receiving. 2. Call counselor to set up appointment - she has the phone number for a counselor whom she has seen before, and whom she liked. She is motivated to call him and schedule an appointment. 3. Begin going to the gym for light  exercise on a weekly basis - we discussed whether this is a reasonable expectation, given that her shortness of breath and edema may prevent her from being able to exercise vigorously  Regarding her symptoms, Leianne names itching and hair loss as being her primary concerning symptoms. We discussed the importance of regular adherence to her lactulose as a way of treating her itching via its binding of bile salts. She did find some relief with the sarna lotion as well. Her hair loss is likely secondary to her chronic illness. Her TSH was checked back in August and was normal, so it does not seem that thyroid dysfunction is contributing to her hair loss.   I am [name]'s primary care provider, and although I have only met her twice prior to today, am hopeful that she and I will be able to have regular follow up in order to adequately manage her symptoms, prevent hospital readmissions, and continue in these discussions. She has a follow-up appointment scheduled with me in clinic at the [facility] on Monday of next week.  Levert Feinstein, MD Family Medicine PGY-2 Palliative Medicine Service Resident

## 2013-07-24 NOTE — Progress Notes (Signed)
Called MD for patient complaint of muscle jerkiness/cramping in right arm and right foot that subsided after a few minutes. MD to see if orders need to be placed. Will cont to monitor

## 2013-07-24 NOTE — Progress Notes (Signed)
Family Medicine Teaching Service Daily Progress Note Intern Pager: [phone]  Patient name: [name] Medical record number: [id number] Date of birth: 08-Nov-1985 Age: 27 y.o. Gender: female  Primary Care Provider: Levert Feinstein, MD Consultants: None Code Status: Full  Pt Overview and Major Events to Date:  12/12 pt admitted overnight for altered mental status, persistent N/V 12/15 Gastric Emptying Study indicating severe gastroparesis  Assessment and Plan: [name] is a 27yo F presenting with altered mental status.  PMH is significant for primary sclerosing cholangitis, substance abuse, cirrhosis, seizures, and thrombocytopenia.  Altered Mental Status: Resolved; Most likely due to combination of hepatic encephalopathy and drug use.  Pt admits doing cocaine x  2 this week, no alcohol or other drugs.  Afebrile, although UA positive for ketones, nitrite, and leukocytes.  Ucx-insignificant growth.  - dose lactulose 20mg  to have 2-3 BM daily (patient refused dose last night, will monitor today to ensure patient gets doses. No BM recorded yesterday) - UDS positive for cocaine, benzo, opiates; long discussion with patient re substance abuse   -will have patient meet with SW hopefully today  Primary sclerosing cholangitis/cirrhosis: pt followed by GI at [facility].  MELD score is 24 - trend CMET - continue home acidophilus - continue home atarax for itching as well as cholestyramine.  - Pt reports good result from itching with Sarna lotion - continue lasix and aldactone for diuresis and ascites - continue nadolol for esophageal varices - increase hydroxyzine to 50mg  TID prn for itching - fluid overloaded, if Creatinine stable 40mg  Lasix IV x2   Severe Nausea and Vomiting - Extensive work up at [facility] last week negative for reversible cause, attributed to biliary and liver disease - Cont IV morphine and phenergan PRN - Gastric emptying study 12/15 demonstrates severe gastroparesis - Reglan  10mg  PO TID before meals - Meeting with palliative this AM about home strategies to prevent readmission   Lower abdominal fullness: Pt began complaining of lower abdominal fullness and decreased urine output - bladder scan to assess for retention, demonstrated no retention  Seizure disorder: continue home Keppra  Thrombocytopenia: stable from recent labs - continue to trend - no heparin for VTE prophylaxis  FEN/GI: regular diet, SLIV PPx: SCDs  Disposition: possible d/c today vs. Tomorrow pending palliative recs and meeting with sw. ?   Subjective: Pt sitting up, laughing with family.  Continues to complain of itching, pain in lower abdomen, and fluid in her upper thighs.  Was able to have some soup last night for dinner and did not have any nausea/vomiting.  Wants to continue to try the lasix today.  Lasix dose increased her UOP yesterday, but still feels she is not urinating enough.    Objective: Temp:  [97.7 F (36.5 C)-99.2 F (37.3 C)] 98.6 F (37 C) (12/16 0620) Pulse Rate:  [82-105] 92 (12/16 0620) Resp:  [18-20] 20 (12/16 0620) BP: (101-133)/(44-61) 129/47 mmHg (12/16 0620) SpO2:  [98 %-100 %] 100 % (12/16 0620) Weight:  [183 lb 3.2 oz (83.099 kg)] 183 lb 3.2 oz (83.099 kg) (12/16 0439) Physical Exam: General: alert, awake, chronically ill appearing Eyes: scleral icterus Cardiovascular: RRR, 2/6 SEM Respiratory: CTA-B Abdomen: soft, mild distension, no ascites, mild TTP center, lower abdomen   Extremities: no edema  Laboratory:  Recent Labs Lab 07/21/13 0440 07/22/13 0828 07/23/13 0635  WBC 4.3 3.6* 5.0  HGB 8.2* 8.6* 8.4*  HCT 23.0* 23.9* 23.8*  PLT 26* 44* 24*    Recent Labs Lab 07/21/13 0440 07/22/13 2956  07/23/13 0635  NA 132* 131* 128*  K 3.9 4.2 4.7  CL 102 102 100  CO2 22 21 19   BUN 15 8 8   CREATININE 0.72 0.71 0.59  CALCIUM 7.8* 7.9* 8.0*  PROT 5.3* 5.4* 5.3*  BILITOT 16.3* 16.1* 16.9*  ALKPHOS 460* 468* 475*  ALT 105* 109* 104*  AST  182* 196* 164*  GLUCOSE 109* 122* 109*   Imaging/Diagnostic Tests: 12/12 CT of head without contrast FINDINGS:  There is no evidence of acute infarction, mass lesion, or intra- or  extra-axial hemorrhage on CT.  There is stable appearance to chronic encephalomalacia at the  posterior left parietal lobe, likely reflecting remote ischemic  insult.  The posterior fossa, including the cerebellum, brainstem and fourth  ventricle, is within normal limits. The third and lateral  ventricles, and basal ganglia are unremarkable in appearance. No  mass effect or midline shift is seen.  There is no evidence of fracture; visualized osseous structures are  unremarkable in appearance. The orbits are within normal limits. The  paranasal sinuses and mastoid air cells are well-aerated. No  significant soft tissue abnormalities are seen.  IMPRESSION:  1. No acute intracranial abnormality seen on CT.  2. Stable appearance to chronic encephalomalacia at the posterior  left parietal lobe, likely reflecting remote ischemic insult.   Henderson Baltimore, MS IV  07/24/2013 8:49 AM  Family Medicine FPTS Intern pager: [phone] Amion password: [email or web address], text pages welcome  Upper Level Addendum:  I have seen and evaluated this patient along with MS4 and reviewed the above note.  BP 112/60  Pulse 84  Temp(Src) 98 F (36.7 C) (Oral)  Resp 18  Ht 5' (1.524 m)  Wt 183 lb 3.2 oz (83.099 kg)  BMI 35.78 kg/m2  SpO2 100% Gen: NAD, resting in bed CV: rrr, 3/6 SEM Pulm: CTAB Abd: soft, mild distension, no ascites, mildly tender throughout  Ext: 2+ pitting edema LE   A/P: patient no longer altered.  -will titrate lactulose up to 30 mg to get 2-3 BMs per day -f/u palliative discussion -change hydroxyzine to cetirizine due to gastroparesis -continue reglan for gastroparesis -potentially home later today vs tomorrow  Marikay Alar, MD Family Medicine PGY-2

## 2013-07-24 NOTE — Progress Notes (Signed)
CM following for DCP/ referral to [facility] (07/23/2013) for substance abuse. Abelino Derrick RN,BSN,MHA [phone]

## 2013-07-25 LAB — CBC
HCT: 22.6 % — ABNORMAL LOW (ref 36.0–46.0)
Hemoglobin: 8.1 g/dL — ABNORMAL LOW (ref 12.0–15.0)
MCH: 34.3 pg — ABNORMAL HIGH (ref 26.0–34.0)
MCHC: 35.8 g/dL (ref 30.0–36.0)
RBC: 2.36 MIL/uL — ABNORMAL LOW (ref 3.87–5.11)
WBC: 3.5 10*3/uL — ABNORMAL LOW (ref 4.0–10.5)

## 2013-07-25 LAB — COMPREHENSIVE METABOLIC PANEL
ALT: 79 U/L — ABNORMAL HIGH (ref 0–35)
Alkaline Phosphatase: 433 U/L — ABNORMAL HIGH (ref 39–117)
BUN: 12 mg/dL (ref 6–23)
CO2: 22 mEq/L (ref 19–32)
Calcium: 7.5 mg/dL — ABNORMAL LOW (ref 8.4–10.5)
Creatinine, Ser: 0.72 mg/dL (ref 0.50–1.10)
GFR calc Af Amer: >90 mL/min (ref >90)
GFR calc non Af Amer: >90 mL/min (ref >90)
Glucose, Bld: 94 mg/dL (ref 70–99)
Sodium: 127 mEq/L — ABNORMAL LOW (ref 135–145)
Total Protein: 5.1 g/dL — ABNORMAL LOW (ref 6.0–8.3)

## 2013-07-25 MED ORDER — MORPHINE SULFATE 2 MG/ML IJ SOLN
1.0000 mg | INTRAMUSCULAR | Status: DC | PRN
  Administered 2013-07-25: 1 mg via INTRAVENOUS

## 2013-07-25 MED ORDER — OXYCODONE HCL 5 MG PO TABS
5.0000 mg | ORAL_TABLET | Freq: Four times a day (QID) | ORAL | Status: DC | PRN
  Administered 2013-07-25 – 2013-07-26 (×4): 5 mg via ORAL
  Filled 2013-07-25 (×5): qty 1

## 2013-07-25 MED ORDER — FUROSEMIDE 10 MG/ML IJ SOLN
40.0000 mg | Freq: Once | INTRAMUSCULAR | Status: AC
  Administered 2013-07-25: 40 mg via INTRAVENOUS
  Filled 2013-07-25: qty 4

## 2013-07-25 MED ORDER — FUROSEMIDE 10 MG/ML IJ SOLN
80.0000 mg | Freq: Once | INTRAMUSCULAR | Status: AC
  Administered 2013-07-25: 80 mg via INTRAVENOUS
  Filled 2013-07-25: qty 8

## 2013-07-25 NOTE — Progress Notes (Addendum)
Family Medicine Teaching Service Daily Progress Note Intern Pager: [phone]  Patient name: [name] Medical record number: [id number] Date of birth: 23-Jan-1986 Age: 27 y.o. Gender: female  Primary Care Provider: Levert Feinstein, MD Consultants: None Code Status: Full  Pt Overview and Major Events to Date:  12/12 pt admitted overnight for altered mental status, persistent N/V 12/15 Gastric Emptying Study indicating severe gastroparesis  Assessment and Plan: [name] is a 27yo F presenting with altered mental status.  PMH is significant for primary sclerosing cholangitis, substance abuse, cirrhosis, seizures, and thrombocytopenia.  Altered Mental Status: Resolved; Most likely due to combination of hepatic encephalopathy and drug use.  Pt admits doing cocaine x  2 this week, no alcohol or other drugs.  Afebrile, although UA positive for ketones, nitrite, and leukocytes.  Ucx-insignificant growth.  - dose lactulose 20mg  to have 2-3 BM daily  - UDS positive for cocaine, benzo, opiates; long discussion with patient re substance abuse  - plan developed with palliative to attend NA daily for 30 days to assist in sobriety    Primary sclerosing cholangitis/cirrhosis: pt followed by GI at [facility].  MELD score is 24 - trend CMET - continue home acidophilus - continue home atarax for itching as well as cholestyramine.  - Pt reports good result from itching with Sarna lotion - continue lasix and aldactone for diuresis and ascites - continue nadolol for esophageal varices - start Loratadine 5mg  daily for itching, changed to this medication because less anticholinergic - fluid overloaded, continue 40mg  lasix IV x2  Severe Nausea and Vomiting - Extensive work up at [facility] last week negative for reversible cause, attributed to biliary and liver disease - Cont IV morphine and phenergan PRN - Gastric emptying study 12/15 demonstrates severe gastroparesis - Reglan 10mg  PO TID before meals - Met with  palliative about home strategies to prevent readmission   Lower abdominal fullness: Pt began complaining of lower abdominal fullness and decreased urine output - bladder scan to assess for retention, demonstrated no retention  Seizure disorder: continue home Keppra  Thrombocytopenia: stable from recent labs - continue to trend - no heparin for VTE prophylaxis  FEN/GI: regular diet, SLIV PPx: SCDs  Disposition: possible d/c today or tomorrow, depending on fluid status  Subjective: Pt doing well this morning.  Urinating more frequently and it is less dark.  Continuing to have bowel movements.  Also, itching seems to be well controlled.  Pt continues to endorse significant swelling in her thighs.    Objective: Temp:  [97.9 F (36.6 C)-98.2 F (36.8 C)] 97.9 F (36.6 C) (12/17 0516) Pulse Rate:  [84-97] 93 (12/17 0516) Resp:  [18] 18 (12/17 0516) BP: (108-122)/(43-60) 108/50 mmHg (12/17 0516) SpO2:  [99 %-100 %] 99 % (12/17 0516) Physical Exam: General: alert, awake, chronically ill appearing Eyes: scleral icterus Cardiovascular: RRR, 2/6 SEM Respiratory: CTA-B Abdomen: soft, mild distension, no ascites Extremities: 1+ pitting edema in lower extremities, especially prominent in upper thighs  Laboratory:  Recent Labs Lab 07/23/13 0635 07/24/13 1305 07/25/13 0610  WBC 5.0 4.4 3.5*  HGB 8.4* 8.6* 8.1*  HCT 23.8* 24.9* 22.6*  PLT 24* 31* 29*    Recent Labs Lab 07/23/13 0635 07/24/13 1010 07/25/13 0610  NA 128* 126* 127*  K 4.7 4.2 4.4  CL 100 96 98  CO2 19 24 22   BUN 8 11 12   CREATININE 0.59 0.74 0.72  CALCIUM 8.0* 7.9* 7.5*  PROT 5.3* 5.6* 5.1*  BILITOT 16.9* 17.1* 14.2*  ALKPHOS 475* 488*  433*  ALT 104* 96* 79*  AST 164* 134* 116*  GLUCOSE 109* 137* 94   Imaging/Diagnostic Tests: 12/12 CT of head without contrast FINDINGS:  There is no evidence of acute infarction, mass lesion, or intra- or  extra-axial hemorrhage on CT.  There is stable appearance  to chronic encephalomalacia at the  posterior left parietal lobe, likely reflecting remote ischemic  insult.  The posterior fossa, including the cerebellum, brainstem and fourth  ventricle, is within normal limits. The third and lateral  ventricles, and basal ganglia are unremarkable in appearance. No  mass effect or midline shift is seen.  There is no evidence of fracture; visualized osseous structures are  unremarkable in appearance. The orbits are within normal limits. The  paranasal sinuses and mastoid air cells are well-aerated. No  significant soft tissue abnormalities are seen.  IMPRESSION:  1. No acute intracranial abnormality seen on CT.  2. Stable appearance to chronic encephalomalacia at the posterior  left parietal lobe, likely reflecting remote ischemic insult.   Henderson Baltimore, MS IV  07/25/2013 9:35 AM [place] [facility] FPTS Intern pager: [phone] Amion password: [email or web address], text pages welcome  Upper Level Addendum  S: pt notes pain and nausea improved but still present, she is concerned about fluid retention on thighs and hip  O:BP 108/50  Pulse 93  Temp(Src) 97.9 F (36.6 C) (Oral)  Resp 18  Ht 5' (1.524 m)  Wt 183 lb 3.2 oz (83.099 kg)  BMI 35.78 kg/m2  SpO2 99% Gen: chronically ill appearing, jaundiced, pleasant CV: 3/6 SEM Pulm: lungs CTA-B Abd: no ascited LE: 2+ pitting edema hips and thighs  A/P: 27 year old F PSC, advanced cirrhosis, gastroparesis and cocaine abuse - Increase lasix to 0 mg IV dose this AM since still holding fluids - Would prefer at least 1 more day diuresis with IV lasix since volume overloaded compared to admission - Appreciate contributions of palliative care team  Si Raider. Clinton Sawyer, MD, MBA 07/25/2013, 9:54 AM Family Medicine Resident, PGY-3 [phone] pager

## 2013-07-25 NOTE — Progress Notes (Signed)
FMTS Attending Note Patient's care discussed with resident team, and I agree with Dr Yetta Numbers assessment and plan.  We will try to further her diuresis with IV Lasix; transitioning all other meds to orals in anticipation of discharge tomorrow morning.  May follow her diuresis further as an outpatient; if not succeeding in achieving diuresis with increased doses of furosemide, may consider switch to torsemide which has better GI absorption in the setting of gut edema. Paula Compton, MD

## 2013-07-26 ENCOUNTER — Telehealth: Payer: Self-pay | Admitting: Oncology

## 2013-07-26 MED ORDER — CHOLESTYRAMINE 4 G PO PACK: 4.0000 g | PACK | Freq: Two times a day (BID) | ORAL | Status: AC

## 2013-07-26 MED ORDER — DESLORATADINE 5 MG PO TABS: 5.0000 mg | ORAL_TABLET | Freq: Every day | ORAL | Status: DC

## 2013-07-26 MED ORDER — METOCLOPRAMIDE HCL 10 MG PO TABS: 10.0000 mg | ORAL_TABLET | Freq: Three times a day (TID) | ORAL | Status: AC

## 2013-07-26 MED ORDER — FUROSEMIDE 40 MG PO TABS: 80.0000 mg | ORAL_TABLET | Freq: Every day | ORAL | Status: DC

## 2013-07-26 MED ORDER — LACTULOSE 10 GM/15ML PO SOLN: 20.0000 g | Freq: Two times a day (BID) | ORAL | Status: AC | PRN

## 2013-07-26 MED ORDER — SPIRONOLACTONE 100 MG PO TABS: 200.0000 mg | ORAL_TABLET | Freq: Every day | ORAL | Status: DC

## 2013-07-26 MED ORDER — ACETAMINOPHEN 500 MG PO TABS: 500.0000 mg | ORAL_TABLET | Freq: Two times a day (BID) | ORAL | Status: DC | PRN

## 2013-07-26 NOTE — Telephone Encounter (Signed)
Mailed the pt her march 2015 appt calendar. The pt is currently in the hospital.

## 2013-07-26 NOTE — Progress Notes (Signed)
Patient IV removed and discharge paperwork and education completed.  Patient's grandmother will drive her home.  Lance Bosch, RN

## 2013-07-27 ENCOUNTER — Emergency Department (HOSPITAL_COMMUNITY)
Admission: EM | Admit: 2013-07-27 | Discharge: 2013-07-27 | Disposition: A | Discharge status: Home / Self Care | Payer: Medicare Other | Source: Home / Self Care | Attending: Emergency Medicine | Admitting: Emergency Medicine

## 2013-07-27 ENCOUNTER — Encounter (HOSPITAL_COMMUNITY): Payer: Self-pay | Admitting: Emergency Medicine

## 2013-07-27 ENCOUNTER — Emergency Department (HOSPITAL_COMMUNITY): Payer: Medicare Other

## 2013-07-27 DIAGNOSIS — F1910 Other psychoactive substance abuse, uncomplicated: Secondary | ICD-10-CM | POA: Insufficient documentation

## 2013-07-27 DIAGNOSIS — E8779 Other fluid overload: Secondary | ICD-10-CM | POA: Diagnosis present

## 2013-07-27 DIAGNOSIS — E871 Hypo-osmolality and hyponatremia: Secondary | ICD-10-CM

## 2013-07-27 DIAGNOSIS — D696 Thrombocytopenia, unspecified: Secondary | ICD-10-CM | POA: Insufficient documentation

## 2013-07-27 DIAGNOSIS — R197 Diarrhea, unspecified: Secondary | ICD-10-CM | POA: Insufficient documentation

## 2013-07-27 DIAGNOSIS — K8309 Other cholangitis: Secondary | ICD-10-CM | POA: Insufficient documentation

## 2013-07-27 DIAGNOSIS — Z87891 Personal history of nicotine dependence: Secondary | ICD-10-CM

## 2013-07-27 DIAGNOSIS — N390 Urinary tract infection, site not specified: Secondary | ICD-10-CM | POA: Insufficient documentation

## 2013-07-27 DIAGNOSIS — M549 Dorsalgia, unspecified: Secondary | ICD-10-CM | POA: Insufficient documentation

## 2013-07-27 DIAGNOSIS — K7290 Hepatic failure, unspecified without coma: Secondary | ICD-10-CM | POA: Diagnosis present

## 2013-07-27 DIAGNOSIS — R748 Abnormal levels of other serum enzymes: Secondary | ICD-10-CM

## 2013-07-27 DIAGNOSIS — I81 Portal vein thrombosis: Secondary | ICD-10-CM | POA: Diagnosis present

## 2013-07-27 DIAGNOSIS — F329 Major depressive disorder, single episode, unspecified: Secondary | ICD-10-CM | POA: Insufficient documentation

## 2013-07-27 DIAGNOSIS — Z79899 Other long term (current) drug therapy: Secondary | ICD-10-CM | POA: Insufficient documentation

## 2013-07-27 DIAGNOSIS — Z8679 Personal history of other diseases of the circulatory system: Secondary | ICD-10-CM | POA: Insufficient documentation

## 2013-07-27 DIAGNOSIS — R7309 Other abnormal glucose: Secondary | ICD-10-CM | POA: Diagnosis not present

## 2013-07-27 DIAGNOSIS — K754 Autoimmune hepatitis: Secondary | ICD-10-CM | POA: Insufficient documentation

## 2013-07-27 DIAGNOSIS — R112 Nausea with vomiting, unspecified: Secondary | ICD-10-CM

## 2013-07-27 DIAGNOSIS — R17 Unspecified jaundice: Secondary | ICD-10-CM | POA: Insufficient documentation

## 2013-07-27 DIAGNOSIS — K7460 Unspecified cirrhosis of liver: Secondary | ICD-10-CM | POA: Insufficient documentation

## 2013-07-27 DIAGNOSIS — E876 Hypokalemia: Secondary | ICD-10-CM | POA: Diagnosis not present

## 2013-07-27 DIAGNOSIS — R1013 Epigastric pain: Secondary | ICD-10-CM | POA: Insufficient documentation

## 2013-07-27 DIAGNOSIS — B952 Enterococcus as the cause of diseases classified elsewhere: Secondary | ICD-10-CM | POA: Diagnosis present

## 2013-07-27 DIAGNOSIS — Z8669 Personal history of other diseases of the nervous system and sense organs: Secondary | ICD-10-CM | POA: Insufficient documentation

## 2013-07-27 DIAGNOSIS — Z9189 Other specified personal risk factors, not elsewhere classified: Secondary | ICD-10-CM | POA: Insufficient documentation

## 2013-07-27 DIAGNOSIS — G40909 Epilepsy, unspecified, not intractable, without status epilepticus: Secondary | ICD-10-CM | POA: Diagnosis present

## 2013-07-27 DIAGNOSIS — F1410 Cocaine abuse, uncomplicated: Secondary | ICD-10-CM | POA: Diagnosis present

## 2013-07-27 DIAGNOSIS — Z8673 Personal history of transient ischemic attack (TIA), and cerebral infarction without residual deficits: Secondary | ICD-10-CM | POA: Insufficient documentation

## 2013-07-27 DIAGNOSIS — R1115 Cyclical vomiting syndrome unrelated to migraine: Secondary | ICD-10-CM | POA: Insufficient documentation

## 2013-07-27 DIAGNOSIS — Z888 Allergy status to other drugs, medicaments and biological substances status: Secondary | ICD-10-CM | POA: Insufficient documentation

## 2013-07-27 DIAGNOSIS — K7682 Hepatic encephalopathy: Secondary | ICD-10-CM | POA: Diagnosis present

## 2013-07-27 DIAGNOSIS — F411 Generalized anxiety disorder: Secondary | ICD-10-CM | POA: Insufficient documentation

## 2013-07-27 DIAGNOSIS — D649 Anemia, unspecified: Secondary | ICD-10-CM | POA: Insufficient documentation

## 2013-07-27 DIAGNOSIS — Z3202 Encounter for pregnancy test, result negative: Secondary | ICD-10-CM | POA: Insufficient documentation

## 2013-07-27 DIAGNOSIS — E873 Alkalosis: Secondary | ICD-10-CM | POA: Diagnosis not present

## 2013-07-27 DIAGNOSIS — F3289 Other specified depressive episodes: Secondary | ICD-10-CM | POA: Insufficient documentation

## 2013-07-27 DIAGNOSIS — R569 Unspecified convulsions: Secondary | ICD-10-CM | POA: Insufficient documentation

## 2013-07-27 DIAGNOSIS — Z8719 Personal history of other diseases of the digestive system: Secondary | ICD-10-CM | POA: Insufficient documentation

## 2013-07-27 DIAGNOSIS — R011 Cardiac murmur, unspecified: Secondary | ICD-10-CM | POA: Insufficient documentation

## 2013-07-27 LAB — COMPREHENSIVE METABOLIC PANEL
Albumin: 1.6 g/dL — ABNORMAL LOW (ref 3.5–5.2)
BUN: 16 mg/dL (ref 6–23)
Calcium: 8 mg/dL — ABNORMAL LOW (ref 8.4–10.5)
Chloride: 94 mEq/L — ABNORMAL LOW (ref 96–112)
Creatinine, Ser: 0.67 mg/dL (ref 0.50–1.10)
GFR calc Af Amer: >90 mL/min (ref >90)
Total Bilirubin: 17.3 mg/dL — ABNORMAL HIGH (ref 0.3–1.2)
Total Protein: 5.7 g/dL — ABNORMAL LOW (ref 6.0–8.3)

## 2013-07-27 LAB — URINALYSIS, ROUTINE W REFLEX MICROSCOPIC
Glucose, UA: NEGATIVE mg/dL
Ketones, ur: 15 mg/dL — AB
Nitrite: POSITIVE — AB
Protein, ur: NEGATIVE mg/dL
Specific Gravity, Urine: 1.005 (ref 1.005–1.030)
pH: 7 (ref 5.0–8.0)

## 2013-07-27 LAB — CG4 I-STAT (LACTIC ACID): Lactic Acid, Venous: 1.43 mmol/L (ref 0.5–2.2)

## 2013-07-27 LAB — CBC WITH DIFFERENTIAL/PLATELET
Basophils Absolute: 0 10*3/uL (ref 0.0–0.1)
Eosinophils Relative: 1 % (ref 0–5)
Hemoglobin: 9 g/dL — ABNORMAL LOW (ref 12.0–15.0)
Lymphocytes Relative: 13 % (ref 12–46)
Lymphs Abs: 0.5 10*3/uL — ABNORMAL LOW (ref 0.7–4.0)
MCH: 34.9 pg — ABNORMAL HIGH (ref 26.0–34.0)
MCV: 96.5 fL (ref 78.0–100.0)
Monocytes Absolute: 0.3 10*3/uL (ref 0.1–1.0)
Neutro Abs: 2.8 10*3/uL (ref 1.7–7.7)
Platelets: 54 10*3/uL — ABNORMAL LOW (ref 150–400)
RBC: 2.58 MIL/uL — ABNORMAL LOW (ref 3.87–5.11)
WBC: 3.5 10*3/uL — ABNORMAL LOW (ref 4.0–10.5)

## 2013-07-27 LAB — AMMONIA: Ammonia: 33 umol/L (ref 11–60)

## 2013-07-27 LAB — POCT I-STAT TROPONIN I: Troponin i, poc: 0 ng/mL (ref 0.00–0.08)

## 2013-07-27 MED ORDER — ONDANSETRON HCL 4 MG/2ML IJ SOLN
4.0000 mg | Freq: Once | INTRAMUSCULAR | Status: AC
  Administered 2013-07-27: 4 mg via INTRAVENOUS
  Filled 2013-07-27: qty 2

## 2013-07-27 MED ORDER — HYDROXYZINE HCL 25 MG PO TABS
25.0000 mg | ORAL_TABLET | Freq: Once | ORAL | Status: DC
  Filled 2013-07-27: qty 1

## 2013-07-27 MED ORDER — MORPHINE SULFATE 4 MG/ML IJ SOLN
4.0000 mg | Freq: Once | INTRAMUSCULAR | Status: AC
  Administered 2013-07-27: 4 mg via INTRAVENOUS
  Filled 2013-07-27: qty 1

## 2013-07-27 MED ORDER — SODIUM CHLORIDE 0.9 % IV BOLUS (SEPSIS)
1000.0000 mL | Freq: Once | INTRAVENOUS | Status: AC
  Administered 2013-07-27: 1000 mL via INTRAVENOUS

## 2013-07-27 MED ORDER — IOHEXOL 300 MG/ML  SOLN
100.0000 mL | Freq: Once | INTRAMUSCULAR | Status: AC | PRN
  Administered 2013-07-27: 100 mL via INTRAVENOUS

## 2013-07-27 MED ORDER — IOHEXOL 300 MG/ML  SOLN
25.0000 mL | Freq: Once | INTRAMUSCULAR | Status: AC | PRN
  Administered 2013-07-27: 25 mL via ORAL

## 2013-07-27 MED ORDER — IOHEXOL 300 MG/ML  SOLN: 25.0000 mL | Freq: Once | INTRAMUSCULAR | Status: DC | PRN

## 2013-07-27 MED ORDER — OXYCODONE HCL 5 MG PO TABS: 5.0000 mg | ORAL_TABLET | Freq: Four times a day (QID) | ORAL | Status: DC | PRN

## 2013-07-27 MED ORDER — HYDROMORPHONE HCL PF 1 MG/ML IJ SOLN
1.0000 mg | Freq: Once | INTRAMUSCULAR | Status: AC
  Administered 2013-07-27: 1 mg via INTRAVENOUS
  Filled 2013-07-27: qty 1

## 2013-07-27 MED ORDER — CEPHALEXIN 500 MG PO CAPS: 500.0000 mg | ORAL_CAPSULE | Freq: Four times a day (QID) | ORAL | Status: AC

## 2013-07-27 MED ORDER — ONDANSETRON 4 MG PO TBDP: ORAL_TABLET | ORAL | Status: DC

## 2013-07-27 MED ORDER — SODIUM CHLORIDE 0.9 % IV BOLUS (SEPSIS)
1000.0000 mL | Freq: Once | INTRAVENOUS | Status: DC
Start: 2013-07-27 — End: 2013-07-27

## 2013-07-27 NOTE — ED Notes (Signed)
Per [facility], pt was discharged from [facility] yesterday for abd pain. Presents tonight for epigastric pain that started back with nausea and vomiting. Can't stop shaking. Has vomited about 8 times in the last 45 minutes since the pain began. EMS gave 4 mg zofran. Pt has end stage liver disease and states her eyes are typically the only thing that is yellow and she is unsure why her skin is yellow. Took 1 OTC acetaminophen this morning for a headache.

## 2013-07-27 NOTE — ED Notes (Signed)
Resident at the bedside

## 2013-07-27 NOTE — ED Notes (Signed)
Pt states her pain is no better. MD made aware. Awaiting new orders.

## 2013-07-27 NOTE — ED Provider Notes (Signed)
CSN: [id number]     Arrival date & time 07/27/13  1515 History   First MD Initiated Contact with Patient 07/27/13 1538     Chief Complaint  Patient presents with  . Abdominal Pain   HPI  [name] is a 27 y.o. female with a PMH of substance abuse, esophageal varices, liver cirrhosis, autoimmune hepatitis, primary sclerosing cholangitis, seizures, thrombocytopenia, and intracranial hemorrhage who presents with cc of abdominal pain, nausea and vomiting. The patient was discharged from here yesterday after she was hospitalized for AMS secondary to hepatic encephalopathy. She states she had mild abdominal pain while in the hospital but upon returning home had a worsening of her pain. She has had multiple episodes of non-bloody non-bilious vomiting. She continues to take her medications as prescribed. She states her pain is located in her upper abdomen. The pain is described as sharp, 10/10, radiates to her back and worsens with movement.   Past Medical History  Diagnosis Date  . Substance abuse April 2013    Cocaine plus THC usage - delisted from liver transplant list.   . Esophageal varices     distal esophageal grade 1 varices, portal gastropathy on EGDs in 01/2010 and 07/2012:  no banding or other intervention undertaken on EGDs from 2009 - 07/2012.    [name] Cirrhosis of liver   . Autoimmune hepatitis     Confirmed via liver biopsy 2002  . PSC (primary sclerosing cholangitis)   . Seizures     onset in 2009 associated with vent requiring resp failure.   . Thrombocytopenia     associated with cirrhosis.  followed by Dr Alcide Evener  . Intracranial hemorrhage 2009    in association with severe thrombocytopenia   Past Surgical History  Procedure Laterality Date  . Esophagogastroduodenoscopy  07/18/2012    Procedure: ESOPHAGOGASTRODUODENOSCOPY (EGD);  Surgeon: Louis Meckel, MD;  Location: [facility];  Service: Endoscopy;  Laterality: N/A;  . Gastric varices banding  07/18/2012    Procedure:  GASTRIC VARICES BANDING;  Surgeon: Louis Meckel, MD;  Location: [facility];  Service: Endoscopy;  Laterality: N/A;  . Ercp  01/2010    Sclerosing Cholangitis with no dominant stricures    Family History  Problem Relation Age of Onset  . Cancer Neg Hx    History  Substance Use Topics  . Smoking status: Former Smoker    Types: Cigarettes    Quit date: 08/09/2005  . Smokeless tobacco: Never Used  . Alcohol Use: No   OB History   Grav Para Term Preterm Abortions TAB SAB Ect Mult Living                 Review of Systems  Constitutional: Negative for fever and chills.  Respiratory: Negative for cough and shortness of breath.   Cardiovascular: Negative for chest pain.  Gastrointestinal: Positive for nausea, vomiting, abdominal pain and diarrhea.  Genitourinary: Negative for dysuria and frequency.  All other systems reviewed and are negative.    Allergies  Ibuprofen  Home Medications   Current Outpatient Rx  Name  Route  Sig  Dispense  Refill  . acetaminophen (TYLENOL) 500 MG tablet   Oral   Take 1 tablet (500 mg total) by mouth 2 (two) times daily as needed for headache.   30 tablet   0   . acidophilus (RISAQUAD) CAPS capsule   Oral   Take 2 capsules by mouth daily.         . cholestyramine (QUESTRAN) 4 G  packet   Oral   Take 1 packet (4 g total) by mouth every 12 (twelve) hours.   20 each   0   . desloratadine (CLARINEX) 5 MG tablet   Oral   Take 1 tablet (5 mg total) by mouth daily.   30 tablet   1   . EPINEPHrine (EPI-PEN) 0.3 mg/0.3 mL DEVI   Intramuscular   Inject 0.3 mg into the muscle as needed (for allergic reaction).          . folic acid (FOLVITE) 1 MG tablet   Oral   Take 1 mg by mouth daily.         . furosemide (LASIX) 40 MG tablet   Oral   Take 2 tablets (80 mg total) by mouth daily.   60 tablet   1   . hydrOXYzine (ATARAX/VISTARIL) 25 MG tablet   Oral   Take 50 mg by mouth daily as needed for itching.         .  lactulose (CHRONULAC) 10 GM/15ML solution   Oral   Take 30 mLs (20 g total) by mouth 2 (two) times daily as needed (to produce 2-3 bowel movements daily).   240 mL   0   . levETIRAcetam (KEPPRA) 750 MG tablet   Oral   Take 750 mg by mouth every 12 (twelve) hours.         . metoCLOPramide (REGLAN) 10 MG tablet   Oral   Take 1 tablet (10 mg total) by mouth 3 (three) times daily before meals.   90 tablet   1   . nadolol (CORGARD) 20 MG tablet   Oral   Take 20 mg by mouth at bedtime.          [name] olopatadine (PATANOL) 0.1 % ophthalmic solution   Both Eyes   Place 1 drop into both eyes 2 (two) times daily.   5 mL   1   . pantoprazole (PROTONIX) 40 MG tablet   Oral   Take 40 mg by mouth 2 (two) times daily.         [name] spironolactone (ALDACTONE) 100 MG tablet   Oral   Take 2 tablets (200 mg total) by mouth daily.   30 tablet   1   . cephALEXin (KEFLEX) 500 MG capsule   Oral   Take 1 capsule (500 mg total) by mouth 4 (four) times daily.   20 capsule   0   . ondansetron (ZOFRAN ODT) 4 MG disintegrating tablet      4mg  ODT q4 hours prn nausea/vomit   10 tablet   0   . oxyCODONE (ROXICODONE) 5 MG immediate release tablet   Oral   Take 1 tablet (5 mg total) by mouth every 6 (six) hours as needed for severe pain.   8 tablet   0    BP 101/68  Pulse 106  Temp(Src) 98.5 F (36.9 C) (Oral)  Resp 17  Ht 5' (1.524 m)  Wt 160 lb (72.576 kg)  BMI 31.25 kg/m2  SpO2 100% Physical Exam  Nursing note and vitals reviewed. Constitutional: She is oriented to person, place, and time. She appears well-developed and well-nourished. No distress.  HENT:  Head: Normocephalic and atraumatic.  Eyes: Conjunctivae are normal. Pupils are equal, round, and reactive to light. Scleral icterus is present.  Neck: Normal range of motion. Neck supple.  Cardiovascular: Normal rate and regular rhythm.  Exam reveals no gallop and no friction rub.   Murmur heard.  Systolic murmur is present  with a grade of 2/6  Pulmonary/Chest: Effort normal and breath sounds normal.  Abdominal: Soft. She exhibits no distension. There is tenderness in the epigastric area.  Musculoskeletal: Normal range of motion. She exhibits no edema and no tenderness.  Neurological: She is alert and oriented to person, place, and time. She has normal strength and normal reflexes. No cranial nerve deficit or sensory deficit.  Skin: Skin is warm and dry.  Psychiatric: She has a normal mood and affect.   ED Course  Procedures (including critical care time) Labs Review Labs Reviewed  COMPREHENSIVE METABOLIC PANEL - Abnormal; Notable for the following:    Sodium 123 (*)    Chloride 94 (*)    Glucose, Bld 109 (*)    Calcium 8.0 (*)    Total Protein 5.7 (*)    Albumin 1.6 (*)    AST 159 (*)    ALT 88 (*)    Alkaline Phosphatase 469 (*)    Total Bilirubin 17.3 (*)    All other components within normal limits  CBC WITH DIFFERENTIAL - Abnormal; Notable for the following:    WBC 3.5 (*)    RBC 2.58 (*)    Hemoglobin 9.0 (*)    HCT 24.9 (*)    MCH 34.9 (*)    MCHC 36.1 (*)    RDW 16.6 (*)    Platelets 54 (*)    Neutrophils Relative % 78 (*)    Lymphs Abs 0.5 (*)    All other components within normal limits  URINALYSIS, ROUTINE W REFLEX MICROSCOPIC - Abnormal; Notable for the following:    Color, Urine AMBER (*)    Hgb urine dipstick LARGE (*)    Bilirubin Urine LARGE (*)    Ketones, ur 15 (*)    Nitrite POSITIVE (*)    Leukocytes, UA SMALL (*)    All other components within normal limits  URINE MICROSCOPIC-ADD ON - Abnormal; Notable for the following:    Bacteria, UA MANY (*)    All other components within normal limits  LIPASE, BLOOD  AMMONIA  CG4 I-STAT (LACTIC ACID)  POCT I-STAT TROPONIN I  POCT PREGNANCY, URINE   Imaging Review Ct Abdomen Pelvis W Contrast  07/27/2013   CLINICAL DATA:  Abdominal pain. Nausea and vomiting. History of end-stage liver disease related to autoimmune  hepatitis and primary sclerosing cholangitis.  EXAM: CT ABDOMEN AND PELVIS WITH CONTRAST  TECHNIQUE: Multidetector CT imaging of the abdomen and pelvis was performed using the standard protocol following bolus administration of intravenous contrast.  CONTRAST:  OMNIPAQUE IOHEXOL 300 MG/ML  SOLN  COMPARISON:  CT of the abdomen and pelvis 12/08/2010.  FINDINGS: Lung Bases: Large esophageal varices incompletely visualized.  Abdomen/Pelvis: The liver has a severely shrunken appearance (with the exception of a hypertrophied caudate lobe) and extremely nodular contour, compatible with advanced cirrhosis. No definite focal hepatic mass is confidently identified at this time. Although the superior mesenteric vein and splenic vein are both patent, the portal vein itself is either severely stenotic or occluded, and there is some cavernous transformation in the region of the porta hepatis. The splenic vein is massively dilated, measuring up to 20 mm in diameter. Associated with this there are numerous portosystemic collateral vessels, including large gastrosplenic varices and a large splenorenal collaterals, as well as numerous other pathways, including a small recannulized paraumbilical vein. The spleen is massively enlarged measuring up to 15.1 x 9.4 x 17.5 cm (estimated splenic volume of 1,242  ml).  The pancreas enhances normally. There is some mild haziness adjacent to the pancreas, which could suggest active inflammation. Alternatively, this may simply be related to an underlying state of mild diffuse soft tissue edema, as are all does appear to be some mild diffuse mesenteric edema and a trace volume of ascites is well. The appearance of the adrenal glands and kidneys is generally unremarkable bilaterally, although the left kidney is slightly medially deviated related to mass effect from the enlarged spleen.  Diffuse colonic wall thickening, most pronounced in the ascending colon, transverse colon and descending  colon. The sigmoid colon and rectum appear relatively normal. No pneumoperitoneum. No pathologic distention of small bowel. Uterus and ovaries are unremarkable in appearance. Urinary bladder is normal in appearance. Small collection of ascites in the low anatomic pelvis.  Musculoskeletal: There are no aggressive appearing lytic or blastic lesions noted in the visualized portions of the skeleton.  IMPRESSION: 1. Advanced cirrhosis with evidence of either severe stenosis or portal vein occlusion resulting in cavernous transformation in the porta hepatis and numerous portosystemic collaterals, as above. Associated with this, there is a massive splenomegaly, a small volume of ascites and mild diffuse mesenteric edema. 2. Subtle haziness in the peripancreatic fat may be part of this underlying state of soft tissue edema, however, clinical correlation for signs and symptoms of pancreatitis is suggested. 3. Extensive colonic wall thickening, most pronounced in the region of the ascending, transverse and descending colon. While this may simply be related to an underlying state of anasarca, clinical correlation for signs and symptoms of colitis is recommended. 4. Additional incidental findings, as above.   Electronically Signed   By: Trudie Reed M.D.   On: 07/27/2013 17:27    EKG Interpretation    Date/Time:  Friday July 27 2013 15:30:36 EST Ventricular Rate:  89 PR Interval:  139 QRS Duration: 101 QT Interval:  380 QTC Calculation: 462 R Axis:   58 Text Interpretation:  Sinus rhythm No significant change since last tracing Confirmed by ZACKOWSKI  MD, SCOTT ([id number]) on 07/27/2013 3:42:54 PM            MDM   1. Intractable nausea and vomiting   2. Hyponatremia   3. Elevated liver enzymes   4. Urinary tract infection     [name] is a 27 y.o. female with a PMH of substance abuse, esophageal varices, liver cirrhosis, autoimmune hepatitis, primary sclerosing cholangitis, seizures,  thrombocytopenia, and intracranial hemorrhage who presents with cc of abdominal pain, nausea and vomiting. Afebrile. Well appearing. Exam as above. CT without acute emergent findings. Lipase WNL. CMP c/w baseline chronic disease except for hyponatremia. Was hyponatremic during this last hospitalization as well and currently is asymptomatic from the hyponatremia. UA c/w UTI. Doubt pyelo. No vomiting here. Rehydrated with IVF. At time of discharge the patient was feeling better. Repeat exam benign. The patient was discharged with keflex, zofran and short script of oxycodone for pain. Follow up with pcp on Monday to have symptoms and sodium rechecked. Return precautions given and discussed with the patient who was in agreement with the plan.   Shanon Ace, MD 07/28/13 [phone]

## 2013-07-28 ENCOUNTER — Encounter (HOSPITAL_COMMUNITY): Payer: Self-pay | Admitting: Emergency Medicine

## 2013-07-28 ENCOUNTER — Emergency Department (HOSPITAL_COMMUNITY)
Admission: EM | Admit: 2013-07-28 | Discharge: 2013-07-29 | Disposition: A | Discharge status: Home / Self Care | Payer: Medicare Other | Source: Home / Self Care | Attending: Emergency Medicine | Admitting: Emergency Medicine

## 2013-07-28 DIAGNOSIS — K7290 Hepatic failure, unspecified without coma: Secondary | ICD-10-CM

## 2013-07-28 DIAGNOSIS — Z792 Long term (current) use of antibiotics: Secondary | ICD-10-CM | POA: Insufficient documentation

## 2013-07-28 DIAGNOSIS — G8929 Other chronic pain: Secondary | ICD-10-CM

## 2013-07-28 DIAGNOSIS — R112 Nausea with vomiting, unspecified: Secondary | ICD-10-CM

## 2013-07-28 DIAGNOSIS — Z87891 Personal history of nicotine dependence: Secondary | ICD-10-CM | POA: Insufficient documentation

## 2013-07-28 DIAGNOSIS — R000 Tachycardia, unspecified: Secondary | ICD-10-CM | POA: Insufficient documentation

## 2013-07-28 DIAGNOSIS — K769 Liver disease, unspecified: Secondary | ICD-10-CM | POA: Insufficient documentation

## 2013-07-28 DIAGNOSIS — Z79899 Other long term (current) drug therapy: Secondary | ICD-10-CM | POA: Insufficient documentation

## 2013-07-28 DIAGNOSIS — R1084 Generalized abdominal pain: Secondary | ICD-10-CM | POA: Insufficient documentation

## 2013-07-28 DIAGNOSIS — G40909 Epilepsy, unspecified, not intractable, without status epilepticus: Secondary | ICD-10-CM | POA: Insufficient documentation

## 2013-07-28 DIAGNOSIS — Z862 Personal history of diseases of the blood and blood-forming organs and certain disorders involving the immune mechanism: Secondary | ICD-10-CM | POA: Insufficient documentation

## 2013-07-28 DIAGNOSIS — E871 Hypo-osmolality and hyponatremia: Secondary | ICD-10-CM | POA: Insufficient documentation

## 2013-07-28 MED ORDER — PROMETHAZINE HCL 25 MG/ML IJ SOLN
12.5000 mg | Freq: Once | INTRAMUSCULAR | Status: AC
  Administered 2013-07-29: 12.5 mg via INTRAVENOUS
  Filled 2013-07-28: qty 1

## 2013-07-28 MED ORDER — HYDROMORPHONE HCL PF 1 MG/ML IJ SOLN: 1.0000 mg | Freq: Once | INTRAMUSCULAR | Status: DC

## 2013-07-28 NOTE — ED Provider Notes (Signed)
CSN: [id number]     Arrival date & time 07/28/13  2250 History   First MD Initiated Contact with Patient 07/28/13 2301     Chief Complaint  Patient presents with  . Abdominal Pain   (Consider location/radiation/quality/duration/timing/severity/associated sxs/prior Treatment) HPI 27 year old female presents to emergency department via EMS from home with complaint of diffuse abdominal pain, nausea and vomiting.  Patient was seen for same yesterday.  She was admitted 61 through the 18th of this month for altered mental status and abdominal pain./Non-vomiting.  Patient has significant past medical history of autoimmune hepatitis, cirrhosis of the liver, primary sclerosing cholangitis, and polysubstance abuse.  She is followed at the family practice clinic and at [facility] liver clinic.  Patient reports that she is taking the pain medication and nausea medication, but is unable to keep them down secondary to nausea and vomiting.  She's not eating.  She reports that she is bringing up bile and flecks of blood.  She has history of esophageal varices, but no prior history of banding.  No fevers no chills.  Patient reports that she is having bowel movements with her lactulose.  She reports decreased urination...  Pain is mainly epigastric and right upper quadrant, and she reports she normally has pains in these areas. Past Medical History  Diagnosis Date  . Substance abuse April 2013    Cocaine plus THC usage - delisted from liver transplant list.   . Esophageal varices     distal esophageal grade 1 varices, portal gastropathy on EGDs in 01/2010 and 07/2012:  no banding or other intervention undertaken on EGDs from 2009 - 07/2012.    [name] Cirrhosis of liver   . Autoimmune hepatitis     Confirmed via liver biopsy 2002  . PSC (primary sclerosing cholangitis)   . Seizures     onset in 2009 associated with vent requiring resp failure.   . Thrombocytopenia     associated with cirrhosis.  followed by Dr Alcide Evener  .  Intracranial hemorrhage 2009    in association with severe thrombocytopenia   Past Surgical History  Procedure Laterality Date  . Esophagogastroduodenoscopy  07/18/2012    Procedure: ESOPHAGOGASTRODUODENOSCOPY (EGD);  Surgeon: Louis Meckel, MD;  Location: [facility];  Service: Endoscopy;  Laterality: N/A;  . Gastric varices banding  07/18/2012    Procedure: GASTRIC VARICES BANDING;  Surgeon: Louis Meckel, MD;  Location: [facility];  Service: Endoscopy;  Laterality: N/A;  . Ercp  01/2010    Sclerosing Cholangitis with no dominant stricures    Family History  Problem Relation Age of Onset  . Cancer Neg Hx    History  Substance Use Topics  . Smoking status: Former Smoker    Types: Cigarettes    Quit date: 08/09/2005  . Smokeless tobacco: Never Used  . Alcohol Use: No   OB History   Grav Para Term Preterm Abortions TAB SAB Ect Mult Living                 Review of Systems  See History of Present Illness; otherwise all other systems are reviewed and negative Allergies  Ibuprofen  Home Medications   Current Outpatient Rx  Name  Route  Sig  Dispense  Refill  . acetaminophen (TYLENOL) 500 MG tablet   Oral   Take 1 tablet (500 mg total) by mouth 2 (two) times daily as needed for headache.   30 tablet   0   . acidophilus (RISAQUAD) CAPS capsule   Oral  Take 2 capsules by mouth daily.         . cephALEXin (KEFLEX) 500 MG capsule   Oral   Take 1 capsule (500 mg total) by mouth 4 (four) times daily.   20 capsule   0   . cholestyramine (QUESTRAN) 4 G packet   Oral   Take 1 packet (4 g total) by mouth every 12 (twelve) hours.   20 each   0   . desloratadine (CLARINEX) 5 MG tablet   Oral   Take 1 tablet (5 mg total) by mouth daily.   30 tablet   1   . folic acid (FOLVITE) 1 MG tablet   Oral   Take 1 mg by mouth daily.         . furosemide (LASIX) 40 MG tablet   Oral   Take 2 tablets (80 mg total) by mouth daily.   60 tablet   1   . hydrOXYzine  (ATARAX/VISTARIL) 25 MG tablet   Oral   Take 50 mg by mouth daily as needed for itching.         . lactulose (CHRONULAC) 10 GM/15ML solution   Oral   Take 30 mLs (20 g total) by mouth 2 (two) times daily as needed (to produce 2-3 bowel movements daily).   240 mL   0   . levETIRAcetam (KEPPRA) 750 MG tablet   Oral   Take 750 mg by mouth every 12 (twelve) hours.         . metoCLOPramide (REGLAN) 10 MG tablet   Oral   Take 1 tablet (10 mg total) by mouth 3 (three) times daily before meals.   90 tablet   1   . nadolol (CORGARD) 20 MG tablet   Oral   Take 20 mg by mouth at bedtime.          . ondansetron (ZOFRAN-ODT) 4 MG disintegrating tablet   Oral   Take 4 mg by mouth every 4 (four) hours as needed for nausea or vomiting.         [name] oxyCODONE (ROXICODONE) 5 MG immediate release tablet   Oral   Take 1 tablet (5 mg total) by mouth every 6 (six) hours as needed for severe pain.   8 tablet   0   . pantoprazole (PROTONIX) 40 MG tablet   Oral   Take 40 mg by mouth 2 (two) times daily.         [name] spironolactone (ALDACTONE) 100 MG tablet   Oral   Take 2 tablets (200 mg total) by mouth daily.   30 tablet   1   . EPINEPHrine (EPI-PEN) 0.3 mg/0.3 mL DEVI   Intramuscular   Inject 0.3 mg into the muscle once as needed (severe allergic reaction).           BP 128/51  Pulse 108  Temp(Src) 98.3 F (36.8 C) (Oral)  Resp 18  Ht 5' (1.524 m)  Wt 175 lb (79.379 kg)  BMI 34.18 kg/m2  SpO2 100% Physical Exam  Nursing note and vitals reviewed. Constitutional: She is oriented to person, place, and time. She appears well-developed and well-nourished. She appears distressed.  HENT:  Head: Normocephalic and atraumatic.  Right Ear: External ear normal.  Left Ear: External ear normal.  Nose: Nose normal.  Mouth/Throat: Oropharynx is clear and moist.  Eyes: Conjunctivae and EOM are normal. Pupils are equal, round, and reactive to light. Scleral icterus is present.  Neck:  Normal range of motion.  Neck supple. No JVD present. No tracheal deviation present. No thyromegaly present.  Cardiovascular: Regular rhythm, normal heart sounds and intact distal pulses.  Exam reveals no gallop and no friction rub.   No murmur heard. Tachycardia  Pulmonary/Chest: Effort normal and breath sounds normal. No stridor. No respiratory distress. She has no wheezes. She has no rales. She exhibits no tenderness.  Abdominal: Soft. Bowel sounds are normal. She exhibits no distension and no mass. There is tenderness (diffuse tenderness). There is no rebound and no guarding.  Musculoskeletal: Normal range of motion. She exhibits no edema and no tenderness.  Lymphadenopathy:    She has no cervical adenopathy.  Neurological: She is alert and oriented to person, place, and time. She exhibits normal muscle tone. Coordination normal.  Skin: Skin is warm and dry. No rash noted. No erythema. No pallor.  Psychiatric: She has a normal mood and affect. Her behavior is normal. Judgment and thought content normal.    ED Course  Procedures (including critical care time) Labs Review Labs Reviewed  COMPREHENSIVE METABOLIC PANEL  CBC  URINALYSIS, ROUTINE W REFLEX MICROSCOPIC  LIPASE, BLOOD  URINE RAPID DRUG SCREEN (HOSP PERFORMED)  ETHANOL   Imaging Review Ct Abdomen Pelvis W Contrast  07/27/2013   CLINICAL DATA:  Abdominal pain. Nausea and vomiting. History of end-stage liver disease related to autoimmune hepatitis and primary sclerosing cholangitis.  EXAM: CT ABDOMEN AND PELVIS WITH CONTRAST  TECHNIQUE: Multidetector CT imaging of the abdomen and pelvis was performed using the standard protocol following bolus administration of intravenous contrast.  CONTRAST:  OMNIPAQUE IOHEXOL 300 MG/ML  SOLN  COMPARISON:  CT of the abdomen and pelvis 12/08/2010.  FINDINGS: Lung Bases: Large esophageal varices incompletely visualized.  Abdomen/Pelvis: The liver has a severely shrunken appearance (with the  exception of a hypertrophied caudate lobe) and extremely nodular contour, compatible with advanced cirrhosis. No definite focal hepatic mass is confidently identified at this time. Although the superior mesenteric vein and splenic vein are both patent, the portal vein itself is either severely stenotic or occluded, and there is some cavernous transformation in the region of the porta hepatis. The splenic vein is massively dilated, measuring up to 20 mm in diameter. Associated with this there are numerous portosystemic collateral vessels, including large gastrosplenic varices and a large splenorenal collaterals, as well as numerous other pathways, including a small recannulized paraumbilical vein. The spleen is massively enlarged measuring up to 15.1 x 9.4 x 17.5 cm (estimated splenic volume of 1,242 ml).  The pancreas enhances normally. There is some mild haziness adjacent to the pancreas, which could suggest active inflammation. Alternatively, this may simply be related to an underlying state of mild diffuse soft tissue edema, as are all does appear to be some mild diffuse mesenteric edema and a trace volume of ascites is well. The appearance of the adrenal glands and kidneys is generally unremarkable bilaterally, although the left kidney is slightly medially deviated related to mass effect from the enlarged spleen.  Diffuse colonic wall thickening, most pronounced in the ascending colon, transverse colon and descending colon. The sigmoid colon and rectum appear relatively normal. No pneumoperitoneum. No pathologic distention of small bowel. Uterus and ovaries are unremarkable in appearance. Urinary bladder is normal in appearance. Small collection of ascites in the low anatomic pelvis.  Musculoskeletal: There are no aggressive appearing lytic or blastic lesions noted in the visualized portions of the skeleton.  IMPRESSION: 1. Advanced cirrhosis with evidence of either severe stenosis or portal vein occlusion  resulting in cavernous transformation in the porta hepatis and numerous portosystemic collaterals, as above. Associated with this, there is a massive splenomegaly, a small volume of ascites and mild diffuse mesenteric edema. 2. Subtle haziness in the peripancreatic fat may be part of this underlying state of soft tissue edema, however, clinical correlation for signs and symptoms of pancreatitis is suggested. 3. Extensive colonic wall thickening, most pronounced in the region of the ascending, transverse and descending colon. While this may simply be related to an underlying state of anasarca, clinical correlation for signs and symptoms of colitis is recommended. 4. Additional incidental findings, as above.   Electronically Signed   By: Trudie Reed M.D.   On: 07/27/2013 17:27    EKG Interpretation   None       MDM   1. Nausea and vomiting   2. Chronic abdominal pain   3. Hyponatremia   4. Liver failure    27 year old female with nausea and vomiting.  History of liver failure, acute on chronic abdominal pain.  Patient with CT scan done on the 19th showing advanced cirrhosis, with some colonic wall thickening, colitis, versus anasarca.  She's not significantly tender in these areas, mainly in right upper quadrant, and just under the xiphoid.  She's not had fevers.  We'll recheck labs.  We'll give pain and nausea medicines, IV fluids and reassess.  8:03 AM Late entry.  Patient has had increase in her total bilirubin.  She is tolerating by mouth fluids.  Discussed with medicine on-call.  Patient has followup with them tomorrow.  She shows no signs of hepatic encephalopathy.  She is safe for discharge home.  At this time.   Olivia Mackie, MD 07/29/13 [phone]

## 2013-07-28 NOTE — ED Notes (Signed)
Pt is A&O x3, c/o pain in abdomen 10/10.  Pt has a jaundice appearance most notably in the sclera of her eyes. +nausea, +emesis x 12 today per pt.

## 2013-07-28 NOTE — ED Notes (Signed)
Bed: WA08 Expected date: 07/28/13 Expected time: 10:43 PM Means of arrival: Ambulance Comments: abd pain

## 2013-07-28 NOTE — ED Notes (Signed)
Called EMS d/t abdominal pain N/V.  Was seen at [facility] for the same yesterday.

## 2013-07-29 LAB — CBC
MCHC: 36.6 g/dL — ABNORMAL HIGH (ref 30.0–36.0)
MCV: 95.6 fL (ref 78.0–100.0)
Platelets: 58 10*3/uL — ABNORMAL LOW (ref 150–400)
RBC: 2.49 MIL/uL — ABNORMAL LOW (ref 3.87–5.11)
RDW: 16.2 % — ABNORMAL HIGH (ref 11.5–15.5)
WBC: 4.3 10*3/uL (ref 4.0–10.5)

## 2013-07-29 LAB — COMPREHENSIVE METABOLIC PANEL
ALT: 91 U/L — ABNORMAL HIGH (ref 0–35)
Alkaline Phosphatase: 424 U/L — ABNORMAL HIGH (ref 39–117)
BUN: 18 mg/dL (ref 6–23)
CO2: 21 mEq/L (ref 19–32)
Calcium: 8.2 mg/dL — ABNORMAL LOW (ref 8.4–10.5)
Chloride: 97 mEq/L (ref 96–112)
Creatinine, Ser: 0.72 mg/dL (ref 0.50–1.10)
GFR calc Af Amer: >90 mL/min (ref >90)
GFR calc non Af Amer: >90 mL/min (ref >90)
Glucose, Bld: 94 mg/dL (ref 70–99)
Potassium: 4.5 mEq/L (ref 3.5–5.1)
Sodium: 125 mEq/L — ABNORMAL LOW (ref 135–145)
Total Protein: 5.6 g/dL — ABNORMAL LOW (ref 6.0–8.3)

## 2013-07-29 LAB — RAPID URINE DRUG SCREEN, HOSP PERFORMED
Amphetamines: NOT DETECTED
Benzodiazepines: NOT DETECTED
Cocaine: POSITIVE — AB
Tetrahydrocannabinol: NOT DETECTED

## 2013-07-29 LAB — URINALYSIS, ROUTINE W REFLEX MICROSCOPIC
Glucose, UA: NEGATIVE mg/dL
Ketones, ur: 15 mg/dL — AB
Nitrite: POSITIVE — AB
Protein, ur: NEGATIVE mg/dL
Specific Gravity, Urine: 1.031 — ABNORMAL HIGH (ref 1.005–1.030)
pH: 6 (ref 5.0–8.0)

## 2013-07-29 LAB — LIPASE, BLOOD: Lipase: 18 U/L (ref 11–59)

## 2013-07-29 LAB — ETHANOL: Alcohol, Ethyl (B): <11 mg/dL (ref 0–11)

## 2013-07-29 LAB — AMMONIA: Ammonia: 53 umol/L (ref 11–60)

## 2013-07-29 LAB — PROTIME-INR
INR: 2.09 — ABNORMAL HIGH (ref 0.00–1.49)
Prothrombin Time: 22.8 seconds — ABNORMAL HIGH (ref 11.6–15.2)

## 2013-07-29 MED ORDER — PROMETHAZINE HCL 25 MG PO TABS: 12.5000 mg | ORAL_TABLET | Freq: Four times a day (QID) | ORAL | Status: AC | PRN

## 2013-07-29 MED ORDER — LACTULOSE 10 GM/15ML PO SOLN
30.0000 g | Freq: Once | ORAL | Status: AC
  Administered 2013-07-29: 30 g via ORAL
  Filled 2013-07-29: qty 45

## 2013-07-29 MED ORDER — PROMETHAZINE HCL 25 MG RE SUPP: 12.5000 mg | Freq: Four times a day (QID) | RECTAL | Status: AC | PRN

## 2013-07-29 MED ORDER — SODIUM CHLORIDE 0.9 % IV BOLUS (SEPSIS)
1000.0000 mL | Freq: Once | INTRAVENOUS | Status: AC
  Administered 2013-07-29: 1000 mL via INTRAVENOUS

## 2013-07-29 NOTE — ED Notes (Signed)
Pt unable to void at this time. 

## 2013-07-30 ENCOUNTER — Other Ambulatory Visit: Payer: Self-pay

## 2013-07-30 ENCOUNTER — Inpatient Hospital Stay (HOSPITAL_COMMUNITY)
Admission: AD | Admit: 2013-07-30 | Discharge: 2013-08-10 | DRG: 444 | Disposition: A | Discharge status: Home / Self Care | Payer: Medicare Other | Source: Ambulatory Visit | Attending: Family Medicine | Admitting: Family Medicine

## 2013-07-30 ENCOUNTER — Encounter: Payer: Self-pay | Admitting: Family Medicine

## 2013-07-30 ENCOUNTER — Encounter (HOSPITAL_COMMUNITY): Payer: Self-pay | Admitting: General Practice

## 2013-07-30 ENCOUNTER — Inpatient Hospital Stay: Payer: Medicare Other | Admitting: Family Medicine

## 2013-07-30 ENCOUNTER — Ambulatory Visit (INDEPENDENT_AMBULATORY_CARE_PROVIDER_SITE_OTHER): Payer: Medicare Other | Admitting: Family Medicine

## 2013-07-30 ENCOUNTER — Inpatient Hospital Stay (HOSPITAL_COMMUNITY): Payer: Medicare Other

## 2013-07-30 VITALS — BP 125/69 | HR 117 | Temp 100.8°F | Ht 63.0 in | Wt 181.0 lb

## 2013-07-30 DIAGNOSIS — R112 Nausea with vomiting, unspecified: Secondary | ICD-10-CM

## 2013-07-30 DIAGNOSIS — R109 Unspecified abdominal pain: Secondary | ICD-10-CM

## 2013-07-30 DIAGNOSIS — I629 Nontraumatic intracranial hemorrhage, unspecified: Secondary | ICD-10-CM

## 2013-07-30 DIAGNOSIS — D61818 Other pancytopenia: Secondary | ICD-10-CM

## 2013-07-30 DIAGNOSIS — K7460 Unspecified cirrhosis of liver: Secondary | ICD-10-CM

## 2013-07-30 DIAGNOSIS — F17200 Nicotine dependence, unspecified, uncomplicated: Secondary | ICD-10-CM

## 2013-07-30 DIAGNOSIS — K7290 Hepatic failure, unspecified without coma: Secondary | ICD-10-CM

## 2013-07-30 DIAGNOSIS — G9340 Encephalopathy, unspecified: Secondary | ICD-10-CM

## 2013-07-30 DIAGNOSIS — D649 Anemia, unspecified: Secondary | ICD-10-CM

## 2013-07-30 DIAGNOSIS — L738 Other specified follicular disorders: Secondary | ICD-10-CM

## 2013-07-30 DIAGNOSIS — R4182 Altered mental status, unspecified: Secondary | ICD-10-CM

## 2013-07-30 DIAGNOSIS — A419 Sepsis, unspecified organism: Secondary | ICD-10-CM

## 2013-07-30 DIAGNOSIS — R188 Other ascites: Secondary | ICD-10-CM

## 2013-07-30 DIAGNOSIS — K769 Liver disease, unspecified: Secondary | ICD-10-CM

## 2013-07-30 DIAGNOSIS — R8789 Other abnormal findings in specimens from female genital organs: Secondary | ICD-10-CM

## 2013-07-30 DIAGNOSIS — N912 Amenorrhea, unspecified: Secondary | ICD-10-CM

## 2013-07-30 DIAGNOSIS — K754 Autoimmune hepatitis: Secondary | ICD-10-CM

## 2013-07-30 DIAGNOSIS — D696 Thrombocytopenia, unspecified: Secondary | ICD-10-CM

## 2013-07-30 DIAGNOSIS — E871 Hypo-osmolality and hyponatremia: Secondary | ICD-10-CM

## 2013-07-30 DIAGNOSIS — R600 Localized edema: Secondary | ICD-10-CM

## 2013-07-30 DIAGNOSIS — K8301 Primary sclerosing cholangitis: Secondary | ICD-10-CM | POA: Diagnosis present

## 2013-07-30 DIAGNOSIS — G8929 Other chronic pain: Secondary | ICD-10-CM

## 2013-07-30 DIAGNOSIS — G40909 Epilepsy, unspecified, not intractable, without status epilepticus: Secondary | ICD-10-CM

## 2013-07-30 DIAGNOSIS — K140 Glossitis: Secondary | ICD-10-CM

## 2013-07-30 DIAGNOSIS — R011 Cardiac murmur, unspecified: Secondary | ICD-10-CM

## 2013-07-30 DIAGNOSIS — K766 Portal hypertension: Secondary | ICD-10-CM

## 2013-07-30 DIAGNOSIS — E7220 Disorder of urea cycle metabolism, unspecified: Secondary | ICD-10-CM

## 2013-07-30 DIAGNOSIS — K7210 Chronic hepatic failure without coma: Secondary | ICD-10-CM

## 2013-07-30 DIAGNOSIS — K8309 Other cholangitis: Secondary | ICD-10-CM

## 2013-07-30 DIAGNOSIS — F1410 Cocaine abuse, uncomplicated: Secondary | ICD-10-CM

## 2013-07-30 DIAGNOSIS — R1031 Right lower quadrant pain: Secondary | ICD-10-CM

## 2013-07-30 DIAGNOSIS — I81 Portal vein thrombosis: Secondary | ICD-10-CM

## 2013-07-30 DIAGNOSIS — N898 Other specified noninflammatory disorders of vagina: Secondary | ICD-10-CM

## 2013-07-30 DIAGNOSIS — F1010 Alcohol abuse, uncomplicated: Secondary | ICD-10-CM

## 2013-07-30 DIAGNOSIS — I8500 Esophageal varices without bleeding: Secondary | ICD-10-CM

## 2013-07-30 HISTORY — DX: Anemia, unspecified: D64.9

## 2013-07-30 HISTORY — DX: Headache: R51

## 2013-07-30 HISTORY — DX: Cardiac murmur, unspecified: R01.1

## 2013-07-30 HISTORY — DX: Depression, unspecified: F32.A

## 2013-07-30 HISTORY — DX: Primary sclerosing cholangitis: K83.01

## 2013-07-30 HISTORY — DX: Major depressive disorder, single episode, unspecified: F32.9

## 2013-07-30 HISTORY — DX: Cerebral infarction, unspecified: I63.9

## 2013-07-30 HISTORY — DX: Personal history of other medical treatment: Z92.89

## 2013-07-30 HISTORY — DX: Anxiety disorder, unspecified: F41.9

## 2013-07-30 LAB — INFLUENZA PANEL BY PCR (TYPE A & B)
H1N1 flu by pcr: NOT DETECTED
Influenza A By PCR: NEGATIVE
Influenza B By PCR: NEGATIVE

## 2013-07-30 LAB — RAPID URINE DRUG SCREEN, HOSP PERFORMED
Amphetamines: NOT DETECTED
Barbiturates: NOT DETECTED
Benzodiazepines: NOT DETECTED

## 2013-07-30 LAB — URINE MICROSCOPIC-ADD ON

## 2013-07-30 LAB — URINALYSIS, ROUTINE W REFLEX MICROSCOPIC
Glucose, UA: NEGATIVE mg/dL
Ketones, ur: NEGATIVE mg/dL
Leukocytes, UA: NEGATIVE
Nitrite: NEGATIVE
Specific Gravity, Urine: 1.009 (ref 1.005–1.030)
pH: 7 (ref 5.0–8.0)

## 2013-07-30 LAB — COMPREHENSIVE METABOLIC PANEL
ALT: 91 U/L — ABNORMAL HIGH (ref 0–35)
Albumin: 1.5 g/dL — ABNORMAL LOW (ref 3.5–5.2)
Alkaline Phosphatase: 381 U/L — ABNORMAL HIGH (ref 39–117)
BUN: 13 mg/dL (ref 6–23)
Chloride: 95 mEq/L — ABNORMAL LOW (ref 96–112)
GFR calc non Af Amer: 85 mL/min — ABNORMAL LOW (ref >90)
Glucose, Bld: 106 mg/dL — ABNORMAL HIGH (ref 70–99)
Potassium: 3.8 mEq/L (ref 3.5–5.1)
Sodium: 125 mEq/L — ABNORMAL LOW (ref 135–145)
Total Bilirubin: 18.5 mg/dL (ref 0.3–1.2)
Total Protein: 5.2 g/dL — ABNORMAL LOW (ref 6.0–8.3)

## 2013-07-30 LAB — URINE CULTURE: Colony Count: 3000

## 2013-07-30 LAB — CBC WITH DIFFERENTIAL/PLATELET
Basophils Absolute: 0 K/uL (ref 0.0–0.1)
Basophils Relative: 0 % (ref 0–1)
Eosinophils Absolute: 0 K/uL (ref 0.0–0.7)
Eosinophils Relative: 1 % (ref 0–5)
HCT: 22.2 % — ABNORMAL LOW (ref 36.0–46.0)
Hemoglobin: 8 g/dL — ABNORMAL LOW (ref 12.0–15.0)
Lymphocytes Relative: 11 % — ABNORMAL LOW (ref 12–46)
Lymphs Abs: 0.5 K/uL — ABNORMAL LOW (ref 0.7–4.0)
MCH: 34.6 pg — ABNORMAL HIGH (ref 26.0–34.0)
MCHC: 36 g/dL (ref 30.0–36.0)
MCV: 96.1 fL (ref 78.0–100.0)
Monocytes Absolute: 0.5 K/uL (ref 0.1–1.0)
Monocytes Relative: 12 % (ref 3–12)
Neutro Abs: 3.2 K/uL (ref 1.7–7.7)
Neutrophils Relative %: 76 % (ref 43–77)
Platelets: 47 K/uL — ABNORMAL LOW (ref 150–400)
RBC: 2.31 MIL/uL — ABNORMAL LOW (ref 3.87–5.11)
RDW: 15.9 % — ABNORMAL HIGH (ref 11.5–15.5)
WBC: 4.2 K/uL (ref 4.0–10.5)

## 2013-07-30 LAB — PROTIME-INR
INR: 2.12 — ABNORMAL HIGH (ref 0.00–1.49)
Prothrombin Time: 23.1 seconds — ABNORMAL HIGH (ref 11.6–15.2)

## 2013-07-30 LAB — MRSA PCR SCREENING: MRSA by PCR: NEGATIVE

## 2013-07-30 LAB — PREGNANCY, URINE: Preg Test, Ur: NEGATIVE

## 2013-07-30 MED ORDER — PANTOPRAZOLE SODIUM 40 MG PO TBEC
40.0000 mg | DELAYED_RELEASE_TABLET | Freq: Two times a day (BID) | ORAL | Status: DC
  Administered 2013-07-30 – 2013-08-10 (×23): 40 mg via ORAL
  Filled 2013-07-30 (×23): qty 1

## 2013-07-30 MED ORDER — FUROSEMIDE 10 MG/ML IJ SOLN
80.0000 mg | Freq: Two times a day (BID) | INTRAMUSCULAR | Status: AC
  Administered 2013-07-30 (×2): 80 mg via INTRAVENOUS
  Filled 2013-07-30 (×3): qty 8

## 2013-07-30 MED ORDER — DEXTROSE 5 % IV SOLN
1.0000 g | INTRAVENOUS | Status: DC
  Administered 2013-07-30 – 2013-08-01 (×3): 1 g via INTRAVENOUS
  Filled 2013-07-30 (×4): qty 10

## 2013-07-30 MED ORDER — PIPERACILLIN-TAZOBACTAM 3.375 G IVPB
3.3750 g | Freq: Three times a day (TID) | INTRAVENOUS | Status: DC
  Administered 2013-07-30 – 2013-07-31 (×2): 3.375 g via INTRAVENOUS
  Filled 2013-07-30 (×5): qty 50

## 2013-07-30 MED ORDER — SODIUM CHLORIDE 0.9 % IJ SOLN: 3.0000 mL | INTRAMUSCULAR | Status: DC | PRN

## 2013-07-30 MED ORDER — METOCLOPRAMIDE HCL 10 MG PO TABS
10.0000 mg | ORAL_TABLET | Freq: Three times a day (TID) | ORAL | Status: DC
  Administered 2013-07-30 – 2013-08-10 (×32): 10 mg via ORAL
  Filled 2013-07-30 (×37): qty 1

## 2013-07-30 MED ORDER — ONDANSETRON HCL 4 MG PO TABS: 4.0000 mg | ORAL_TABLET | Freq: Four times a day (QID) | ORAL | Status: DC | PRN

## 2013-07-30 MED ORDER — HYDROXYZINE HCL 25 MG PO TABS
50.0000 mg | ORAL_TABLET | Freq: Every day | ORAL | Status: DC | PRN
  Administered 2013-07-31 – 2013-08-03 (×4): 50 mg via ORAL
  Filled 2013-07-30 (×5): qty 2

## 2013-07-30 MED ORDER — PIPERACILLIN-TAZOBACTAM 3.375 G IVPB 30 MIN
3.3750 g | Freq: Four times a day (QID) | INTRAVENOUS | Status: DC
  Administered 2013-07-30: 3.375 g via INTRAVENOUS
  Filled 2013-07-30 (×4): qty 50

## 2013-07-30 MED ORDER — LACTULOSE 10 GM/15ML PO SOLN
20.0000 g | Freq: Three times a day (TID) | ORAL | Status: DC
  Administered 2013-07-30 – 2013-08-04 (×17): 20 g via ORAL
  Filled 2013-07-30 (×21): qty 30

## 2013-07-30 MED ORDER — SODIUM CHLORIDE 0.9 % IV SOLN: 250.0000 mL | INTRAVENOUS | Status: DC | PRN

## 2013-07-30 MED ORDER — LEVETIRACETAM 750 MG PO TABS
750.0000 mg | ORAL_TABLET | Freq: Two times a day (BID) | ORAL | Status: DC
  Administered 2013-07-30 – 2013-08-10 (×23): 750 mg via ORAL
  Filled 2013-07-30 (×25): qty 1

## 2013-07-30 MED ORDER — SODIUM CHLORIDE 0.9 % IJ SOLN
3.0000 mL | Freq: Two times a day (BID) | INTRAMUSCULAR | Status: DC
  Administered 2013-07-30 – 2013-08-10 (×14): 3 mL via INTRAVENOUS

## 2013-07-30 MED ORDER — SPIRONOLACTONE 100 MG PO TABS
200.0000 mg | ORAL_TABLET | Freq: Every day | ORAL | Status: DC
  Filled 2013-07-30: qty 2

## 2013-07-30 MED ORDER — FUROSEMIDE 80 MG PO TABS
80.0000 mg | ORAL_TABLET | Freq: Every day | ORAL | Status: DC
  Filled 2013-07-30: qty 1

## 2013-07-30 MED ORDER — LACTULOSE 10 GM/15ML PO SOLN
20.0000 g | Freq: Two times a day (BID) | ORAL | Status: DC
  Filled 2013-07-30 (×2): qty 30

## 2013-07-30 MED ORDER — NADOLOL 20 MG PO TABS
20.0000 mg | ORAL_TABLET | Freq: Every day | ORAL | Status: DC
  Administered 2013-07-30 – 2013-08-09 (×11): 20 mg via ORAL
  Filled 2013-07-30 (×13): qty 1

## 2013-07-30 MED ORDER — RISAQUAD PO CAPS
2.0000 | ORAL_CAPSULE | Freq: Every day | ORAL | Status: DC
  Administered 2013-07-30 – 2013-08-10 (×12): 2 via ORAL
  Filled 2013-07-30 (×12): qty 2

## 2013-07-30 MED ORDER — ONDANSETRON HCL 4 MG/2ML IJ SOLN
4.0000 mg | Freq: Four times a day (QID) | INTRAMUSCULAR | Status: DC | PRN
  Administered 2013-07-30 (×2): 4 mg via INTRAVENOUS
  Filled 2013-07-30 (×2): qty 2

## 2013-07-30 MED ORDER — CAMPHOR-MENTHOL 0.5-0.5 % EX LOTN
TOPICAL_LOTION | CUTANEOUS | Status: DC | PRN
  Filled 2013-07-30: qty 222

## 2013-07-30 MED ORDER — MORPHINE SULFATE 2 MG/ML IJ SOLN
2.0000 mg | INTRAMUSCULAR | Status: DC | PRN
  Administered 2013-07-30 – 2013-08-02 (×15): 2 mg via INTRAVENOUS
  Filled 2013-07-30 (×16): qty 1

## 2013-07-30 MED ORDER — SODIUM CHLORIDE 0.9 % IJ SOLN
3.0000 mL | Freq: Two times a day (BID) | INTRAMUSCULAR | Status: DC
  Administered 2013-07-30 – 2013-08-09 (×12): 3 mL via INTRAVENOUS

## 2013-07-30 MED ORDER — FOLIC ACID 1 MG PO TABS
1.0000 mg | ORAL_TABLET | Freq: Every day | ORAL | Status: DC
  Administered 2013-07-30 – 2013-08-10 (×12): 1 mg via ORAL
  Filled 2013-07-30 (×12): qty 1

## 2013-07-30 MED ORDER — CHOLESTYRAMINE 4 G PO PACK
4.0000 g | PACK | Freq: Two times a day (BID) | ORAL | Status: DC
  Administered 2013-07-30 – 2013-08-10 (×23): 4 g via ORAL
  Filled 2013-07-30 (×25): qty 1

## 2013-07-30 MED ORDER — TRAMADOL HCL 50 MG PO TABS
50.0000 mg | ORAL_TABLET | Freq: Two times a day (BID) | ORAL | Status: DC | PRN
  Administered 2013-07-30 – 2013-08-02 (×5): 50 mg via ORAL
  Filled 2013-07-30 (×7): qty 1

## 2013-07-30 NOTE — H&P (Signed)
Family Medicine Teaching [facility] Admission History and Physical Service Pager: [phone]  Patient name: [name] Medical record number: [id number] Date of birth: 10-04-1985 Age: 27 y.o. Gender: female  Primary Care Provider: Levert Feinstein, MD Consultants: none Code Status: full  Chief Complaint: abdominal pain, fever  Assessment and Plan: [name] is a 27 y.o. female presenting with abdominal pain, nausea, fever. PMH is significant for primary sclerosing cholangitis (followed by Dr. Piedad Climes at [facility]), autoimmune hepatitis, seizure disorder, thrombocytopenia.  # Abdominal pain, vomiting, fever: likely related to pt's chronic primary sclerosing cholangitis. She does have a UTI for which the ER gave her keflex, which she has not been able to tolerate this by mouth. She is presently febrile and is potentially at risk for spontaneous bacterial peritonitis. Will proceed with imaging and repeat urine studies, in addition to standard blood work. - admit to telemetry - check CBC, CMET, ammonia, urinalysis, urine pregnancy test, urine culture, blood cx x2 - CT abd/pelvis with contrast - zofran 4mg  q4h prn, also continue home reglan - ceftriaxone per pharmacy for UTI tx, broaden if clinically worsens - avoid tylenol in setting of severe liver disease - likely will need to consult GI, but will await results of CT scan and labwork  # Seizure disorder: continue home keppra, avoid tramadol (has been on pt's medication list as recently as last hospitalization)  # Primary sclerosing cholangitis:  -checking studies as above -continue lactulose at home dose, titrate up for 2-3 bowel movements per day -continue home spironolactone, lasix, cholestyramine  # Shaking spells: observed in clinic. more consistent with rigors than seizures. - continue to monitor - seizure precautions  # Drug use: cocaine is drug of choice - check UDS - continue to encourage abstinence - thoroughly  discussed with pt during last admission the importance of remaining substance free in order to be listed for transplant  FEN/GI: SLIV, clear liquid diet Prophylaxis: SCD's (platelets < 100 on CBC yesterday so no heparin)  Disposition: admit to telemetry, will need to be able to tolerate PO prior to discharge  History of Present Illness: [name] is a 27 y.o. female presenting to clinic for hospital follow up.  She reports that she has gone to the ER daily for the last two days but has been discharged home each time. Complains of headache, abdominal pain, fever, and chills. Her grandmother accompanies her and mentions that her eyes are rolling back in her head. She has been complaint with her lactulose but has had decreased stool output. Last stool was early this morning. She is having trouble with urination as well. She states she never felt better when she was discharged from the hospital last week. She has vomited at home but has been able to keep some clear liquids down.  Review Of Systems: Per HPI otherwise review of systems was performed and was unremarkable.  Patient Active Problem List   Diagnosis Date Noted  . Intractable nausea and vomiting 07/21/2013  . Altered mental status 07/20/2013  . Tongue ulceration 06/14/2013  . Vaginal discharge 06/14/2013  . Heart murmur, systolic 03/27/2013  . Sepsis 03/25/2013  . Cocaine abuse 03/24/2013  . Acute encephalopathy 03/24/2013  . Hyperammonemia 03/24/2013  . Other pancytopenia 12/26/2012  . Alcohol abuse 12/26/2012  . Abdominal  pain, other specified site 12/26/2012  . Toxic effect of ethanol 12/16/2012  . Abdominal pain, chronic, right upper quadrant 11/27/2012  . Anemia 10/02/2012  . Bilateral lower abdominal pain 07/23/2012  . Autoimmune hepatitis 06/22/2012  .  Ascites 06/22/2012  . 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 Nausea and vomiting in adult 06/21/2011  . Hyponatremia 06/21/2011  . Edema leg 05/26/2011  . Seizure  disorder 03/26/2011  . ENCEPHALOPATHY-HEPATIC 09/18/2009  . Portal hypertension 09/18/2009  . INTRACRANIAL HEMORRHAGE 12/05/2007  . OTHER SPECIFIED DISEASE OF HAIR&HAIR FOLLICLES 12/05/2007  . AMENORRHEA 06/29/2007  . THROMBOCYTOPENIA 10/06/2006  . TOBACCO DEPENDENCE 10/06/2006  . ESOPHAGEAL VARICES 10/06/2006  . HEPATIC CIRRHOSIS, NONALCOHOLIC 10/06/2006  . LIVER FAILURE 10/06/2006  . PAPANICOLAOU SMEAR, ABNORMAL 10/06/2006   Past Medical History: Past Medical History  Diagnosis Date  . Substance abuse April 2013    Cocaine plus THC usage - delisted from liver transplant list.   . Esophageal varices     distal esophageal grade 1 varices, portal gastropathy on EGDs in 01/2010 and 07/2012:  no banding or other intervention undertaken on EGDs from 2009 - 07/2012.    [name] Cirrhosis of liver   . Autoimmune hepatitis     Confirmed via liver biopsy 2002  . PSC (primary sclerosing cholangitis)   . Seizures     onset in 2009 associated with vent requiring resp failure.   . Thrombocytopenia     associated with cirrhosis.  followed by Dr Alcide Evener  . Intracranial hemorrhage 2009    in association with severe thrombocytopenia   Past Surgical History: Past Surgical History  Procedure Laterality Date  . Esophagogastroduodenoscopy  07/18/2012    Procedure: ESOPHAGOGASTRODUODENOSCOPY (EGD);  Surgeon: Louis Meckel, MD;  Location: [facility];  Service: Endoscopy;  Laterality: N/A;  . Gastric varices banding  07/18/2012    Procedure: GASTRIC VARICES BANDING;  Surgeon: Louis Meckel, MD;  Location: [facility];  Service: Endoscopy;  Laterality: N/A;  . Ercp  01/2010    Sclerosing Cholangitis with no dominant stricures    Social History: History  Substance Use Topics  . Smoking status: Former Smoker    Types: Cigarettes    Quit date: 08/09/2005  . Smokeless tobacco: Never Used  . Alcohol Use: No   Additional social history: hx of cocaine abuse but states has not used since discharge  from hospital last week  Please also refer to relevant sections of EMR.  Family History: Family History  Problem Relation Age of Onset  . Cancer Neg Hx    Allergies and Medications: Allergies  Allergen Reactions  . Ibuprofen Shortness Of Breath   No current facility-administered medications on file prior to encounter.   Current Outpatient Prescriptions on File Prior to Encounter  Medication Sig Dispense Refill  . acetaminophen (TYLENOL) 500 MG tablet Take 1 tablet (500 mg total) by mouth 2 (two) times daily as needed for headache.  30 tablet  0  . acidophilus (RISAQUAD) CAPS capsule Take 2 capsules by mouth daily.      . cephALEXin (KEFLEX) 500 MG capsule Take 1 capsule (500 mg total) by mouth 4 (four) times daily.  20 capsule  0  . cholestyramine (QUESTRAN) 4 G packet Take 1 packet (4 g total) by mouth every 12 (twelve) hours.  20 each  0  . desloratadine (CLARINEX) 5 MG tablet Take 1 tablet (5 mg total) by mouth daily.  30 tablet  1  . EPINEPHrine (EPI-PEN) 0.3 mg/0.3 mL DEVI Inject 0.3 mg into the muscle once as needed (severe allergic reaction).       . folic acid (FOLVITE) 1 MG tablet Take 1 mg by mouth daily.      . furosemide (LASIX) 40 MG tablet Take 2  tablets (80 mg total) by mouth daily.  60 tablet  1  . hydrOXYzine (ATARAX/VISTARIL) 25 MG tablet Take 50 mg by mouth daily as needed for itching.      . lactulose (CHRONULAC) 10 GM/15ML solution Take 30 mLs (20 g total) by mouth 2 (two) times daily as needed (to produce 2-3 bowel movements daily).  240 mL  0  . levETIRAcetam (KEPPRA) 750 MG tablet Take 750 mg by mouth every 12 (twelve) hours.      . metoCLOPramide (REGLAN) 10 MG tablet Take 1 tablet (10 mg total) by mouth 3 (three) times daily before meals.  90 tablet  1  . nadolol (CORGARD) 20 MG tablet Take 20 mg by mouth at bedtime.       . ondansetron (ZOFRAN-ODT) 4 MG disintegrating tablet Take 4 mg by mouth every 4 (four) hours as needed for nausea or vomiting.      [name]  oxyCODONE (ROXICODONE) 5 MG immediate release tablet Take 1 tablet (5 mg total) by mouth every 6 (six) hours as needed for severe pain.  8 tablet  0  . pantoprazole (PROTONIX) 40 MG tablet Take 40 mg by mouth 2 (two) times daily.      . promethazine (PHENERGAN) 25 MG suppository Place 0.5 suppositories (12.5 mg total) rectally every 6 (six) hours as needed for nausea or vomiting.  12 each  0  . promethazine (PHENERGAN) 25 MG tablet Take 0.5 tablets (12.5 mg total) by mouth every 6 (six) hours as needed for nausea.  30 tablet  0  . spironolactone (ALDACTONE) 100 MG tablet Take 2 tablets (200 mg total) by mouth daily.  30 tablet  1    Objective: T 100.8 Oral  BP 125/69  P 117  Exam: General: mild distress, frequent shaking/rigors HEENT: prominent scleral icterus, PERRL, MMM Cardiovascular: tachycardic, regular rhythm Respiratory: CTAB Abdomen: RUQ fullness present, TTP in RUQ and LLQ Extremities: 1+ edema bilat lower extremities Skin: no acute rashes noted Neuro: grossly nonfocal, speech intact, oriented to person, place, and month but thinks year is 2012.  Labs and Imaging: No labs yet  Latrelle Dodrill, MD 07/30/2013, 11:09 AM PGY-2, [facility] Family Medicine FPTS Intern pager: [phone], text pages welcome

## 2013-07-30 NOTE — Progress Notes (Signed)
ANTIBIOTIC CONSULT NOTE - INITIAL  Pharmacy Consult for ceftriaxone Indication: UTI  Allergies  Allergen Reactions  . Ibuprofen Shortness Of Breath   Vital Signs: Temp: 99 F (37.2 C) (12/22 1215) Temp src: Oral (12/22 1215) BP: 140/70 mmHg (12/22 1215) Pulse Rate: 90 (12/22 1215) Intake/Output from previous day:   Intake/Output from this shift:    Labs:  Recent Labs  07/27/13 1545 07/29/13 0035  WBC 3.5* 4.3  HGB 9.0* 8.7*  PLT 54* 58*  CREATININE 0.67 0.72   The CrCl is unknown because both a height and weight (above a minimum accepted value) are required for this calculation. No results found for this basename: Rolm Gala, VANCORANDOM, GENTTROUGH, GENTPEAK, GENTRANDOM, TOBRATROUGH, TOBRAPEAK, TOBRARND, AMIKACINPEAK, AMIKACINTROU, AMIKACIN,  in the last 72 hours   Microbiology: Recent Results (from the past 720 hour(s))  URINE CULTURE     Status: None   Collection Time    07/07/13  4:12 PM      Result Value Range Status   Specimen Description URINE, CLEAN CATCH   Final   Special Requests NONE   Final   Culture  Setup Time     Final   Value: 07/08/2013 04:14     Performed at [facility] Count     Final   Value: NO GROWTH     Performed at [facility]   Culture     Final   Value: NO GROWTH     Performed at [facility]   Report Status 07/09/2013 FINAL   Final  URINE CULTURE     Status: None   Collection Time    07/20/13  6:58 AM      Result Value Range Status   Specimen Description URINE, CLEAN CATCH   Final   Special Requests NONE   Final   Culture  Setup Time     Final   Value: 07/20/2013 14:01     Performed at [facility] Count     Final   Value: 9,000 COLONIES/ML     Performed at [facility]   Culture     Final   Value: INSIGNIFICANT GROWTH     Performed at [facility]   Report Status 07/21/2013 FINAL   Final  MRSA PCR SCREENING     Status: None   Collection Time     07/22/13  6:34 AM      Result Value Range Status   MRSA by PCR NEGATIVE  NEGATIVE Final   Comment:            The GeneXpert MRSA Assay (FDA     approved for NASAL specimens     only), is one component of a     comprehensive MRSA colonization     surveillance program. It is not     intended to diagnose MRSA     infection nor to guide or     monitor treatment for     MRSA infections.    Medical History: Past Medical History  Diagnosis Date  . Substance abuse April 2013    Cocaine plus THC usage - delisted from liver transplant list.   . Esophageal varices     distal esophageal grade 1 varices, portal gastropathy on EGDs in 01/2010 and 07/2012:  no banding or other intervention undertaken on EGDs from 2009 - 07/2012.    [name] Cirrhosis of liver   . Autoimmune hepatitis     Confirmed  via liver biopsy 2002  . PSC (primary sclerosing cholangitis)   . Seizures     onset in 2009 associated with vent requiring resp failure.   . Thrombocytopenia     associated with cirrhosis.  followed by Dr Alcide Evener  . Intracranial hemorrhage 2009    in association with severe thrombocytopenia     Assessment: Patient is a 27 y.o F with c/o abdominal pain and fever.  She was on Keflex PTA for UTI.  To start Rocephin inpatient for UTI.  Plan:  1) Rocephin 1gm IV q24h 2) Pharmacy will sign off as no renal adjustment is needed for rocephin.  Eilish Mcdaniel P 07/30/2013,12:56 PM

## 2013-07-30 NOTE — Progress Notes (Addendum)
Pt requesting advanced diet. Denies nausea or vomiting. No abd pain at this time. Has had multiple BM since admission. Afebrile since admission. Pt also complaining of bilateral headache without vision changes, phonophobia or photophobia.  - Will advance diet as tolerated - Tramadol for headache  Si Raider. Clinton Sawyer, MD, MBA 07/30/2013, 5:34 PM Family Medicine Resident, PGY-3 [phone] pager

## 2013-07-30 NOTE — Progress Notes (Signed)
CRITICAL VALUE ALERT  Critical value received: bilirubin 18.5  Date of notification:  12/22  Time of notification:  1404  Critical value read back:yes  Nurse who received alert:  Morrie Sheldon   MD notified (1st page):    Time of first page:  1404  MD notified (2nd page):  Time of second page:  Responding MD:   Time MD responded:  [phone]

## 2013-07-30 NOTE — Progress Notes (Signed)
Patient ID: [name], female   DOB: 1985-10-15, 27 y.o.   MRN: [id number]  Patient was seen in clinic and directly admitted to the hospital. Please see separate H&P for details.  Latrelle Dodrill, MD

## 2013-07-30 NOTE — H&P (Signed)
FMTS Attending Admit Note Patient seen and examined by me in conjunction with Dr Pollie Meyer, I agree with has assessment and plan with following additions. Patient known to me from most recent admission; she has documented fever with rigors in the clinic today, as well as worsening of her icteric sclerae today.  Concern for sepsis from ascending cholangitis, with other possible sources being urinary tract and SBP.  Results of recent CT abd/pelvis from 12/19 noted as well.  I agree with re-imaging of abdomen, to consider [place] with doppler flow of portal vein (noted to be stenotic or occluded) with splenomegaly (small volume ascites noted at that time).  Would involve GI in the event she should require ERCP; empiric antibiotic coverage for cholangitis (would add Zosyn to the Ceftriaxone already ordered) and follow blood cultures.  She seems to be maintaining her BPs at this time but low threshold for transfer to higher level of care. To follow up on CBC/platelets. Paula Compton, MD

## 2013-07-30 NOTE — Progress Notes (Signed)
Interim Progress Note:  S: pt seen and complaining of nausea, vomiting, back and head pain; no bowel movements in 2 days and decrease urine output  O: BP 140/70  Pulse 90  Temp(Src) 99 F (37.2 C) (Oral)  Resp 18  SpO2 100% Gen: chronically ill appearing, non distressed HEENT: norma ROM of neck, no nuchal rigidity, scleral icterus at baselines, no sinus tenderness CV: rrr, 3/6 SM at RUSB Lungs: CTA-B Abd: small fluid wave, mild distention, non tender Hip: 1+ pitting edema Extermities: no edema  A/P: 65 year F with cirrhosis 2/2 autoimmune hepatitis, PSC who presents with worsening nausea, vomiting, and fever.   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 Clinton Sawyer, MD, MBA 07/30/2013, 1:58 PM Family Medicine Resident, PGY-3 [phone] pager

## 2013-07-31 DIAGNOSIS — D649 Anemia, unspecified: Secondary | ICD-10-CM

## 2013-07-31 DIAGNOSIS — K754 Autoimmune hepatitis: Secondary | ICD-10-CM

## 2013-07-31 DIAGNOSIS — F1410 Cocaine abuse, uncomplicated: Secondary | ICD-10-CM

## 2013-07-31 LAB — URINE CULTURE: Culture: NO GROWTH

## 2013-07-31 LAB — COMPREHENSIVE METABOLIC PANEL
ALT: 83 U/L — ABNORMAL HIGH (ref 0–35)
AST: 140 U/L — ABNORMAL HIGH (ref 0–37)
Albumin: 1.4 g/dL — ABNORMAL LOW (ref 3.5–5.2)
Alkaline Phosphatase: 358 U/L — ABNORMAL HIGH (ref 39–117)
BUN: 10 mg/dL (ref 6–23)
Calcium: 7.4 mg/dL — ABNORMAL LOW (ref 8.4–10.5)
Creatinine, Ser: 0.92 mg/dL (ref 0.50–1.10)
Potassium: 2.9 mEq/L — ABNORMAL LOW (ref 3.5–5.1)
Sodium: 128 mEq/L — ABNORMAL LOW (ref 135–145)
Total Protein: 5.1 g/dL — ABNORMAL LOW (ref 6.0–8.3)

## 2013-07-31 LAB — CBC
MCH: 34.7 pg — ABNORMAL HIGH (ref 26.0–34.0)
MCHC: 35.8 g/dL (ref 30.0–36.0)
Platelets: 44 10*3/uL — ABNORMAL LOW (ref 150–400)
RDW: 15.9 % — ABNORMAL HIGH (ref 11.5–15.5)

## 2013-07-31 MED ORDER — POTASSIUM CHLORIDE 10 MEQ/100ML IV SOLN
10.0000 meq | INTRAVENOUS | Status: AC
  Administered 2013-07-31 – 2013-08-01 (×4): 10 meq via INTRAVENOUS
  Filled 2013-07-31 (×5): qty 100

## 2013-07-31 MED ORDER — METHYLPREDNISOLONE SODIUM SUCC 125 MG IJ SOLR
60.0000 mg | Freq: Four times a day (QID) | INTRAMUSCULAR | Status: DC
  Administered 2013-07-31: 60 mg via INTRAVENOUS
  Filled 2013-07-31 (×5): qty 0.96

## 2013-07-31 MED ORDER — METHYLPREDNISOLONE SODIUM SUCC 125 MG IJ SOLR
60.0000 mg | Freq: Four times a day (QID) | INTRAMUSCULAR | Status: DC
  Administered 2013-07-31 – 2013-08-03 (×10): 60 mg via INTRAVENOUS
  Administered 2013-08-03: 10:00:00 via INTRAVENOUS
  Administered 2013-08-03 – 2013-08-04 (×4): 60 mg via INTRAVENOUS
  Filled 2013-07-31 (×6): qty 0.96
  Filled 2013-07-31: qty 2
  Filled 2013-07-31 (×11): qty 0.96

## 2013-07-31 NOTE — Progress Notes (Signed)
Family Medicine Teaching Service Daily Progress Note Intern Pager: [phone]  Patient name: [name] Medical record number: [id number] Date of birth: April 09, 1986 Age: 27 y.o. Gender: female  Primary Care Provider: Levert Feinstein, MD Consultants: None Code Status: Full  Pt Overview and Major Events to Date:   Assessment and Plan: [name] is a 27 y.o. female presenting with abdominal pain, nausea, fever. PMH is significant for primary sclerosing cholangitis (followed by Dr. Piedad Climes at [facility]), autoimmune hepatitis, seizure disorder, thrombocytopenia.   # Abdominal pain, vomiting, fever: likely related to pt's chronic primary sclerosing cholangitis. She does have a UTI for which the ER gave her keflex, which she has not been able to tolerate this by mouth.  Currently afebrile. No evidence of SBP, will d/c zosyn at this time and cont rocephin. Questionable portal vein occlusion on CT?  -hold on angiogram at this time for w/up  -reglan, zofran, morphine 2mg  q4 - ceftriaxone per pharmacy for UTI tx, broaden if clinically worsens  - Bcx pending - avoid tylenol in setting of severe liver disease  - will start gen diet -consideration of GI consultation vs palliation - steroids for symptomatic relief   # Seizure disorder/shaking spells: continue home keppra, avoid tramadol (has been on pt's medication list as recently as last hospitalization) Shaking observed in clinic. more consistent with rigors than seizures. No evidence of further episodes noted - continue to monitor  - seizure precautions   # Primary sclerosing cholangitis: tbili 18.9; difficult to r/o ascending cholangitis based on labs given chronic dx. No acute abdomen at this time and no fevers. -continue lactulose at home dose, titrate up for 2-3 bowel movements per day  -continue home spironolactone, lasix, cholestyramine   # Volume overload: lungs CTAB, notable edema of hips and buttocks. Net neg 1.6L yesterday  -s/p  lasix 80mg  IV X2 -will cont to follow fluid status, lasix prn   # Drug use: cocaine is drug of choice, but pt attesting to recently deciding to stop; UDS unfortunately + for cocaine and opiates - continue to encourage abstinence  - thoroughly discussed with pt during last admission the importance of remaining substance free in order to be listed for transplant   FEN/GI: SLIV, clear liquid diet  Prophylaxis: SCD's (platelets 44)  Disposition: pending improved clinical status and transition over to PO abx  Subjective: Hungry and wanting to eat, "its the only thing that I live for"  Objective: Temp:  [98.1 F (36.7 C)-100.8 F (38.2 C)] 98.1 F (36.7 C) (12/23 0421) Pulse Rate:  [74-117] 74 (12/23 0421) Resp:  [18] 18 (12/23 0421) BP: (102-140)/(62-74) 102/65 mmHg (12/23 0421) SpO2:  [97 %-100 %] 97 % (12/23 0421) Weight:  [181 lb (82.101 kg)] 181 lb (82.101 kg) (12/22 2112)  Intake/Output Summary (Last 24 hours) at 07/31/13 0944 Last data filed at 07/31/13 0700  Gross per 24 hour  Intake   1850 ml  Output   3525 ml  Net  -1675 ml    Physical Exam: General: Lying in bed, NAD, scleral icterus and sig jaundice Cardiovascular: RRR, no mrg Respiratory: CTAB, no wheezing Abdomen: soft, obese, nt/nd no palpable fluid wave Extremities: [facility]  Laboratory:  Recent Labs Lab 07/29/13 0035 07/30/13 1255 07/31/13 0515  WBC 4.3 4.2 3.1*  HGB 8.7* 8.0* 7.6*  HCT 23.8* 22.2* 21.2*  PLT 58* 47* 44*    Recent Labs Lab 07/29/13 0035 07/30/13 1255 07/31/13 0515  NA 125* 125* 128*  K 4.5 3.8 2.9*  CL 97  95* 96  CO2 21 19 24   BUN 18 13 10   CREATININE 0.72 0.92 0.92  CALCIUM 8.2* 7.6* 7.4*  PROT 5.6* 5.2* 5.1*  BILITOT 19.3* 18.5* 18.9*  ALKPHOS 424* 381* 358*  ALT 91* 91* 83*  AST 154* 167* 140*  GLUCOSE 94 106* 125*   UA large bili, sml hemoglobin Ucx pending Bcx pending  Imaging/Diagnostic Tests: From previous admission 07/27/13 IMPRESSION:  1. Advanced cirrhosis  with evidence of either severe stenosis or  portal vein occlusion resulting in cavernous transformation in the  porta hepatis and numerous portosystemic collaterals, as above.  Associated with this, there is a massive splenomegaly, a small  volume of ascites and mild diffuse mesenteric edema.  2. Subtle haziness in the peripancreatic fat may be part of this  underlying state of soft tissue edema, however, clinical correlation  for signs and symptoms of pancreatitis is suggested.  3. Extensive colonic wall thickening, most pronounced in the region  of the ascending, transverse and descending colon. While this may  simply be related to an underlying state of anasarca, clinical  correlation for signs and symptoms of colitis is recommended.  4. Additional incidental findings, as above.   07/30/13 US abdomen IMPRESSION:  There is a small fluid collection in the right lower quadrant of the  abdomen. No large volume of ascites is demonstrated.   Anselm Lis, MD 07/31/2013, 9:33 AM PGY-1, [facility] Family Medicine FPTS Intern pager: [phone], text pages welcome

## 2013-07-31 NOTE — Progress Notes (Signed)
FMTS Attending Daily Note: Terri Levy MD [phone] pager office [phone] I  have seen and examined this patient, reviewed their chart. I have discussed this patient with the resident. I agree with the resident's findings, assessment and care plan. We reviewed her extensively at rounds today. When i first saw her yesterday in clinic with Dr. Pollie Meyer I was concerned for infectious etiology. After review, I am more inclined to think her fever and general malaise are a result of her primarily sclerosing cholangitis. There is no specific treatment for this. We are going to try a steroid burst to see if it will help her symptomatically. Await final culture results tomorrow, if negative we can d/c the antibiotic and likely d/c home.

## 2013-08-01 ENCOUNTER — Inpatient Hospital Stay (HOSPITAL_COMMUNITY): Payer: Medicare Other

## 2013-08-01 DIAGNOSIS — K7460 Unspecified cirrhosis of liver: Secondary | ICD-10-CM

## 2013-08-01 DIAGNOSIS — G8929 Other chronic pain: Secondary | ICD-10-CM

## 2013-08-01 DIAGNOSIS — R1011 Right upper quadrant pain: Secondary | ICD-10-CM

## 2013-08-01 DIAGNOSIS — K766 Portal hypertension: Secondary | ICD-10-CM

## 2013-08-01 LAB — CBC
HCT: 23.4 % — ABNORMAL LOW (ref 36.0–46.0)
Hemoglobin: 8.3 g/dL — ABNORMAL LOW (ref 12.0–15.0)
MCH: 34.7 pg — ABNORMAL HIGH (ref 26.0–34.0)
MCHC: 35.5 g/dL (ref 30.0–36.0)

## 2013-08-01 LAB — COMPREHENSIVE METABOLIC PANEL
ALT: 96 U/L — ABNORMAL HIGH (ref 0–35)
AST: 128 U/L — ABNORMAL HIGH (ref 0–37)
CO2: 19 mEq/L (ref 19–32)
Calcium: 7.5 mg/dL — ABNORMAL LOW (ref 8.4–10.5)
GFR calc non Af Amer: 69 mL/min — ABNORMAL LOW (ref >90)
Sodium: 127 mEq/L — ABNORMAL LOW (ref 135–145)
Total Protein: 5.5 g/dL — ABNORMAL LOW (ref 6.0–8.3)

## 2013-08-01 MED ORDER — SPIRONOLACTONE 100 MG PO TABS
100.0000 mg | ORAL_TABLET | Freq: Every day | ORAL | Status: DC
  Administered 2013-08-01 – 2013-08-03 (×3): 100 mg via ORAL
  Filled 2013-08-01 (×4): qty 1

## 2013-08-01 MED ORDER — FUROSEMIDE 40 MG PO TABS
40.0000 mg | ORAL_TABLET | Freq: Every day | ORAL | Status: DC
  Administered 2013-08-01: 40 mg via ORAL
  Filled 2013-08-01 (×2): qty 1

## 2013-08-01 MED ORDER — MORPHINE SULFATE (CONCENTRATE) 10 MG /0.5 ML PO SOLN
10.0000 mg | Freq: Three times a day (TID) | ORAL | Status: DC | PRN
  Administered 2013-08-02 – 2013-08-03 (×4): 10 mg via ORAL
  Filled 2013-08-01 (×4): qty 0.5

## 2013-08-01 MED ORDER — IOHEXOL 350 MG/ML SOLN: 100.0000 mL | Freq: Once | INTRAVENOUS | Status: AC | PRN

## 2013-08-01 NOTE — Progress Notes (Signed)
I have seen and examined this patient. I have discussed with Dr Gwenlyn Saran.  I agree with their findings and plans as documented in their progress note. Pt complaining of pain in right upper abdominal with movement.  Area feels hard to her.  Eating well.  No nausea. Wants to restart her diuretics.  Patient on systemic corticosteroids for possible autoimmune hepatitis-related fever.  Given the question of portal vein thrombosis on ultrasouns, will get CTA abdomen to look for confirmation of PVT.  Anticoagulation would be a two-edged sword for patient with esophageal and other dilated portacaval varices.  Would patient need antibiotic coverage if there is a confirmed PVT?

## 2013-08-01 NOTE — Progress Notes (Signed)
Family Medicine Teaching Service Daily Progress Note Intern Pager: [phone]  Patient name: [name] Medical record number: [id number] Date of birth: March 16, 1986 Age: 27 y.o. Gender: female  Primary Care Provider: Levert Feinstein, MD Consultants: None Code Status: Full  Pt Overview and Major Events to Date:   Assessment and Plan: [name] is a 27 y.o. female presenting with abdominal pain, nausea, fever. PMH is significant for primary sclerosing cholangitis (followed by Dr. Piedad Climes at [facility]), autoimmune hepatitis, seizure disorder, thrombocytopenia.   # Abdominal pain, vomiting, fever: likely related to pt's chronic primary sclerosing cholangitis. She was treated for a presumed UTI with keflex, which she did not tolerate by mouth. Was on zosyn which was then switched to CTX given no evidence of SBP.    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  - reglan, zofran, morphine 2mg  q4 - UCx showing no growth to date. Will discontinue ceftriaxone - Bcx from 07/30/13 showing no growth to date - avoid tylenol in setting of severe liver disease  - regular diet - started on solumedrol on 07/31/13 for symptomatic relief   # Seizure disorder/shaking spells: continue home keppra, avoid tramadol (has been on pt's medication list as recently as last hospitalization) Shaking observed in clinic. more consistent with rigors than seizures. No evidence of further episodes noted - continue to monitor  - seizure precautions   # Primary sclerosing cholangitis: tbili 18.9; difficult to r/o ascending cholangitis based on labs given chronic dx. No acute abdomen at this time and no fevers. -continue lactulose at home dose, titrate up for 2-3 bowel movements per day  -continue home cholestyramine. Currently not getting spironolactone and lasix.   # Volume overload: lungs CTAB, notable  edema of hips and buttocks.   -s/p lasix 80mg  IV X2 on 07/30/13. Appears fluid overloaded on exam.  - f/u BMP today for K and consider starting back lasix and spironolactone  # Drug use: cocaine is drug of choice, but pt attesting to recently deciding to stop; UDS unfortunately + for cocaine and opiates - continue to encourage abstinence  - thoroughly discussed with pt during last admission the importance of remaining substance free in order to be listed for transplant   FEN/GI: SLIV, regular diet Prophylaxis: SCD's (platelets 44)  Disposition: pending improved clinical status and transition over to PO abx  Subjective: having some pain in her right side where there is a hard knot. Otherwise, eating and drinking without nausea or vomiting.   Objective: Temp:  [97.4 F (36.3 C)-98 F (36.7 C)] 98 F (36.7 C) (12/24 6295) Pulse Rate:  [71-88] 88 (12/24 0614) Resp:  [18-20] 18 (12/24 0614) BP: (106-126)/(54-73) 117/69 mmHg (12/24 0614) SpO2:  [100 %] 100 % (12/24 0614) Weight:  [181 lb (82.101 kg)] 181 lb (82.101 kg) (12/23 2131)  Intake/Output Summary (Last 24 hours) at 08/01/13 0941 Last data filed at 08/01/13 2841  Gross per 24 hour  Intake   2063 ml  Output    540 ml  Net   1523 ml    Physical Exam: General: Lying in bed, NAD, scleral icterus and sig jaundice Cardiovascular: RRR, 3/6 systolic murmur best heard at left sternal border appreciated Respiratory: CTAB, no wheezing Abdomen: obese, tender along the right quadrant with indurated area, otherwise soft Extremities: 3+ pitting edema up to thigh  Laboratory:  Recent Labs Lab 07/29/13 0035 07/30/13 1255 07/31/13 0515  WBC 4.3 4.2 3.1*  HGB 8.7* 8.0* 7.6*  HCT 23.8* 22.2* 21.2*  PLT 58* 47* 44*    Recent Labs Lab 07/29/13 0035 07/30/13 1255 07/31/13 0515  NA 125* 125* 128*  K 4.5 3.8 2.9*  CL 97 95* 96  CO2 21 19 24   BUN 18 13 10   CREATININE 0.72 0.92 0.92  CALCIUM 8.2* 7.6* 7.4*  PROT 5.6* 5.2* 5.1*   BILITOT 19.3* 18.5* 18.9*  ALKPHOS 424* 381* 358*  ALT 91* 91* 83*  AST 154* 167* 140*  GLUCOSE 94 106* 125*   UA large bili, sml hemoglobin Ucx: no growth Bcx negative to date  Imaging/Diagnostic Tests: From previous admission 07/27/13 IMPRESSION:  1. Advanced cirrhosis with evidence of either severe stenosis or  portal vein occlusion resulting in cavernous transformation in the  porta hepatis and numerous portosystemic collaterals, as above.  Associated with this, there is a massive splenomegaly, a small  volume of ascites and mild diffuse mesenteric edema.  2. Subtle haziness in the peripancreatic fat may be part of this  underlying state of soft tissue edema, however, clinical correlation  for signs and symptoms of pancreatitis is suggested.  3. Extensive colonic wall thickening, most pronounced in the region  of the ascending, transverse and descending colon. While this may  simply be related to an underlying state of anasarca, clinical  correlation for signs and symptoms of colitis is recommended.  4. Additional incidental findings, as above.   07/30/13 US abdomen IMPRESSION:  There is a small fluid collection in the right lower quadrant of the  abdomen. No large volume of ascites is demonstrated.   Lonia Skinner, MD 08/01/2013, 9:41 AM PGY-3, [place] [facility] FPTS Intern pager: [phone], text pages welcome

## 2013-08-02 DIAGNOSIS — I81 Portal vein thrombosis: Secondary | ICD-10-CM

## 2013-08-02 MED ORDER — FUROSEMIDE 10 MG/ML IJ SOLN
80.0000 mg | Freq: Two times a day (BID) | INTRAMUSCULAR | Status: DC
  Filled 2013-08-02: qty 8

## 2013-08-02 MED ORDER — FUROSEMIDE 10 MG/ML IJ SOLN
80.0000 mg | Freq: Two times a day (BID) | INTRAMUSCULAR | Status: AC
  Administered 2013-08-02 – 2013-08-03 (×2): 80 mg via INTRAVENOUS
  Filled 2013-08-02 (×2): qty 8

## 2013-08-02 NOTE — Progress Notes (Signed)
FMTS Attending Note Patient seen and examined by me, discussed with resident team and I agree with Dr Nyra Capes note for today.  Patient continues to complain of abdominal edema, RUQ pain which is somewhat better than previous. Given her portal vein occlusion on previous CT imaging, plan to review with IR to see if intervention is warranted on their part.  Plan to communicate with her [facility] hepatology team regarding her condition for their recommendations.  Paula Compton, MD

## 2013-08-02 NOTE — Progress Notes (Signed)
Family Medicine Teaching Service Daily Progress Note Intern Pager: [phone]  Patient name: [name] Medical record number: [id number] Date of birth: Feb 25, 1986 Age: 27 y.o. Gender: female  Primary Care Provider: Levert Feinstein, MD Consultants: None Code Status: Full  Pt Overview and Major Events to Date:   Assessment and Plan: [name] is a 27 y.o. female presenting with abdominal pain, nausea, fever. PMH is significant for primary sclerosing cholangitis (followed by Dr. Piedad Climes at [facility]), autoimmune hepatitis, seizure disorder, thrombocytopenia.   # Abdominal pain, vomiting, fever: likely related to pt's chronic primary sclerosing cholangitis. She was treated for a presumed UTI with keflex, which she did not tolerate by mouth. Was on zosyn which was then switched to CTX given no evidence of SBP. Cultures have all been neg so stopped CTX 12/25. CT abdomen showing Questionable portal vein occlusion however radiology feels that CTA will not provide [place] with any additional information. MRI MRCP at [facility] in November 30th demonstrated a "diminutive protal vein but no evidence of thrombosis" - will continue to try to contact her hepatologist at [facility] to see if they recommend any further imaging or evaluation.  - reglan, zofran, morphine 2mg  q4 - added oral morphine concentrate yesterday (pt has not used yet) - Bcx from 07/30/13 showing no growth to date - avoid tylenol in setting of severe liver disease  - regular diet - started on solumedrol on 07/31/13 for symptomatic relief  - will touch base with IR to document whether there is any indication for intervention  # Seizure disorder/shaking spells: continue home keppra, avoid tramadol (has been on pt's medication list as recently as last hospitalization) Shaking observed in clinic. more consistent with rigors than seizures. No evidence of further episodes noted - continue to monitor  - seizure precautions   # Primary  sclerosing cholangitis: tbili up to 20.2; difficult to r/o ascending cholangitis based on labs given chronic dx. However no acute abdomen at this time and no fevers. -continue lactulose at home dose, titrate up for 2-3 bowel movements per day  -continue home cholestyramine. Restarted spironolactone will increase lasix to BID IV dosing. -monitor thrombocytopenia   # Volume overload: lungs CTAB, notable edema of hips and buttocks.   -s/p lasix 80mg  IV X2 on 07/30/13. Continues to appears fluid overloaded on exam, abdomen full this morning - f/u BMP today for K much improved to 4.1 -will schedule another 80BID IV lasix  # Drug use: cocaine is drug of choice, but pt attesting to recently deciding to stop; UDS unfortunately + for cocaine and opiates - continue to encourage abstinence  - thoroughly discussed with pt during last admission the importance of remaining substance free in order to be listed for transplant   FEN/GI: SLIV, regular diet Prophylaxis: SCD's (platelets 40)  Disposition: pending further w/up of PVT and improvement of abdominal pain  Subjective: Still endorsing right sided pain (not associated with eating or drinking), abdomen feels full, would like to know if she has a clot or not  Objective: Temp:  [97.7 F (36.5 C)-98.4 F (36.9 C)] 98.3 F (36.8 C) (12/25 0559) Pulse Rate:  [77-108] 82 (12/25 0559) Resp:  [18-19] 18 (12/25 0559) BP: (108-132)/(62-74) 108/65 mmHg (12/25 0559) SpO2:  [99 %-100 %] 100 % (12/25 0559) Weight:  [185 lb (83.915 kg)] 185 lb (83.915 kg) (12/24 2218)  Intake/Output Summary (Last 24 hours) at 08/02/13 0721 Last data filed at 08/02/13 0600  Gross per 24 hour  Intake   1263 ml  Output      0 ml  Net   1263 ml    Physical Exam: General: Lying in bed, sleeping on her side, NAD, scleral icterus and sig jaundice Cardiovascular: RRR, 3/6 systolic murmur best heard at left sternal border appreciated Respiratory: CTAB, no wheezing Abdomen:  obese, tender along the right quadrant with indurated area, otherwise soft, markedly distended  Extremities: 3+ pitting edema up to thigh Skin: bruising by IV site   Laboratory:  Recent Labs Lab 07/30/13 1255 07/31/13 0515 08/01/13 1115  WBC 4.2 3.1* 4.3  HGB 8.0* 7.6* 8.3*  HCT 22.2* 21.2* 23.4*  PLT 47* 44* 40*    Recent Labs Lab 07/30/13 1255 07/31/13 0515 08/01/13 1115  NA 125* 128* 127*  K 3.8 2.9* 4.1  CL 95* 96 99  CO2 19 24 19   BUN 13 10 14   CREATININE 0.92 0.92 1.10  CALCIUM 7.6* 7.4* 7.5*  PROT 5.2* 5.1* 5.5*  BILITOT 18.5* 18.9* 20.2*  ALKPHOS 381* 358* 403*  ALT 91* 83* 96*  AST 167* 140* 128*  GLUCOSE 106* 125* 260*   UA large bili, sml hemoglobin Ucx: no growth Bcx negative to date  Imaging/Diagnostic Tests: From previous admission 07/27/13 IMPRESSION:  1. Advanced cirrhosis with evidence of either severe stenosis or  portal vein occlusion resulting in cavernous transformation in the  porta hepatis and numerous portosystemic collaterals, as above.  Associated with this, there is a massive splenomegaly, a small  volume of ascites and mild diffuse mesenteric edema.  2. Subtle haziness in the peripancreatic fat may be part of this  underlying state of soft tissue edema, however, clinical correlation  for signs and symptoms of pancreatitis is suggested.  3. Extensive colonic wall thickening, most pronounced in the region  of the ascending, transverse and descending colon. While this may  simply be related to an underlying state of anasarca, clinical  correlation for signs and symptoms of colitis is recommended.  4. Additional incidental findings, as above.   07/30/13 US abdomen IMPRESSION:  There is a small fluid collection in the right lower quadrant of the  abdomen. No large volume of ascites is demonstrated.   Anselm Lis, MD 08/02/2013, 7:21 AM PGY-1, [facility] FPTS Intern pager: [phone], text pages welcome

## 2013-08-03 DIAGNOSIS — R609 Edema, unspecified: Secondary | ICD-10-CM

## 2013-08-03 LAB — CBC
HCT: 25 % — ABNORMAL LOW (ref 36.0–46.0)
Hemoglobin: 8.9 g/dL — ABNORMAL LOW (ref 12.0–15.0)
MCH: 35 pg — ABNORMAL HIGH (ref 26.0–34.0)
Platelets: 44 10*3/uL — ABNORMAL LOW (ref 150–400)
RBC: 2.54 MIL/uL — ABNORMAL LOW (ref 3.87–5.11)
WBC: 6 10*3/uL (ref 4.0–10.5)

## 2013-08-03 LAB — COMPREHENSIVE METABOLIC PANEL
AST: 89 U/L — ABNORMAL HIGH (ref 0–37)
BUN: 21 mg/dL (ref 6–23)
CO2: 21 mEq/L (ref 19–32)
Calcium: 7.9 mg/dL — ABNORMAL LOW (ref 8.4–10.5)
Creatinine, Ser: 0.81 mg/dL (ref 0.50–1.10)
GFR calc Af Amer: >90 mL/min (ref >90)
GFR calc non Af Amer: >90 mL/min (ref >90)
Total Bilirubin: 16.6 mg/dL — ABNORMAL HIGH (ref 0.3–1.2)
Total Protein: 5.6 g/dL — ABNORMAL LOW (ref 6.0–8.3)

## 2013-08-03 MED ORDER — FUROSEMIDE 10 MG/ML IJ SOLN
80.0000 mg | Freq: Two times a day (BID) | INTRAMUSCULAR | Status: AC
  Administered 2013-08-03: 80 mg via INTRAVENOUS

## 2013-08-03 MED ORDER — MORPHINE SULFATE 2 MG/ML IJ SOLN: 1.0000 mg | INTRAMUSCULAR | Status: DC | PRN

## 2013-08-03 NOTE — Progress Notes (Signed)
CSW received consult for substance use. CSW went by patients room. Patient currently asleep and has visitor by bedside. CSW will try again later or pass on to the weekend social worker who will follow up with patient.  Maree Krabbe, MSW, Theresia Majors [phone]

## 2013-08-03 NOTE — Progress Notes (Signed)
FMTS Attending Note  I personally saw and evaluated the patient. The plan of care was discussed with the resident team. I agree with the assessment and plan as documented by the resident.   RUQ pain improved today, still requiring intermittent IV Dilauded, tolerating full diet without nausea or vomiting, No ascites/fluid wave appreciated on exam today, RUQ pain present  1. Primary sclerosing cholangitis with acute nausea/vomitis/RUQ pain - improving, transition to PO pain medications, contact Hepatic physicians at [facility] for further recommendations on treatment 2. Portal Vein thrombosis - contact IR for evaluation of benefit of thrombolysis 3. Other chronic medical conditions stable, agree with plan as documented.   Disposition: pending [facility] hepatic and IR input as noted above, If no further recommendations by specialists will attempt to wean pain meds to home regimen and discharge if continues to tolerate diet without nausea/vomiting.  Donnella Sham MD

## 2013-08-03 NOTE — Progress Notes (Signed)
Family Medicine Teaching Service Daily Progress Note Intern Pager: [phone]  Patient name: [name] Medical record number: [id number] Date of birth: February 03, 1986 Age: 27 y.o. Gender: female  Primary Care Provider: Levert Feinstein, MD Consultants: None Code Status: Full  Pt Overview and Major Events to Date:   Assessment and Plan: [name]  is a 27 y.o. female presenting with abdominal pain, nausea, fever. PMH is significant for primary sclerosing cholangitis (followed by Dr. Piedad Climes at [facility]), autoimmune hepatitis, seizure disorder, thrombocytopenia.   # Abdominal pain, vomiting, fever: likely related to pt's chronic primary sclerosing cholangitis. She was treated for a presumed UTI with keflex, which she did not tolerate by mouth. Was on zosyn which was then switched to CTX given no evidence of SBP. Cultures have all been neg so stopped CTX 12/25. CT abdomen showing questionable portal vein occlusion however radiology feels that CTA will not provide [place] with any additional information. MRI MRCP at [facility] in November 30th demonstrated a "diminutive portal vein but no evidence of thrombosis" - will discuss with interventional radiology to evaluate options and try to contact her hepatologist at [facility] to see if they recommend any further imaging or evaluation.  - reglan, zofran, morphine 2mg  q4, oral morphine solution- attempt to transition to PO pain meds - Bcx from 07/30/13 showing no growth to date - avoid tylenol in setting of severe liver disease  - regular diet - started on solumedrol on 07/31/13 for symptomatic relief   # Seizure disorder/shaking spells: continue home keppra, avoid tramadol (has been on pt's medication list as recently as last hospitalization) Shaking observed in clinic. more consistent with rigors than seizures. No evidence of further episodes noted - continue to monitor  - seizure precautions   # Primary sclerosing cholangitis: tbili up to 20.2;  difficult to r/o ascending cholangitis based on labs given chronic dx. However no acute abdomen at this time and no fevers. -continue lactulose at home dose, titrate up for 2-3 bowel movements per day  -continue home cholestyramine. Restarted spironolactone will increase lasix to BID IV dosing. -monitor thrombocytopenia   # Volume overload: lungs CTAB, notable edema of hips and buttocks.   - Continues to appears fluid overloaded on exam, abdomen full this morning - f/u CMP today for K - 80 mg IV LAsix X 2 today, Labs and exam to guide tomorrow's treatment.   # Drug use: cocaine is drug of choice, but pt attesting to recently deciding to stop; UDS unfortunately + for cocaine and opiates - continue to encourage abstinence  - thoroug discussion with pt during last admission the importance of remaining substance free in order to be listed for transplant   FEN/GI: SLIV, regular diet Prophylaxis: SCD's (platelets 40)  Disposition: pending further w/up of PVT and improvement of abdominal pain  Subjective: Still endorsing right sided pain which is unchanged to worse, also states she feels drowsy.   Objective: Temp:  [97.5 F (36.4 C)-98 F (36.7 C)] 97.8 F (36.6 C) (12/26 1000) Pulse Rate:  [81-100] 100 (12/26 1000) Resp:  [18-20] 18 (12/26 1000) BP: (110-136)/(56-73) 124/56 mmHg (12/26 1000) SpO2:  [99 %-100 %] 100 % (12/26 0435)  Intake/Output Summary (Last 24 hours) at 08/03/13 1044 Last data filed at 08/03/13 0216  Gross per 24 hour  Intake    462 ml  Output      0 ml  Net    462 ml    Physical Exam: General: Lying in bed, sleeping on her side, NAD,  scleral icterus and sig jaundice Cardiovascular: RRR, 3/6 systolic murmur best heard at left sternal border appreciated Respiratory: CTAB, no wheezing Abdomen: obese, tender along the right quadrant with indurated area, otherwise soft, markedly distended  Extremities: 3+ pitting edema up to thigh  Laboratory:  Recent Labs Lab  07/30/13 1255 07/31/13 0515 08/01/13 1115  WBC 4.2 3.1* 4.3  HGB 8.0* 7.6* 8.3*  HCT 22.2* 21.2* 23.4*  PLT 47* 44* 40*    Recent Labs Lab 07/30/13 1255 07/31/13 0515 08/01/13 1115  NA 125* 128* 127*  K 3.8 2.9* 4.1  CL 95* 96 99  CO2 19 24 19   BUN 13 10 14   CREATININE 0.92 0.92 1.10  CALCIUM 7.6* 7.4* 7.5*  PROT 5.2* 5.1* 5.5*  BILITOT 18.5* 18.9* 20.2*  ALKPHOS 381* 358* 403*  ALT 91* 83* 96*  AST 167* 140* 128*  GLUCOSE 106* 125* 260*   UA large bili, sml hemoglobin Ucx: no growth Bcx negative to date  Imaging/Diagnostic Tests: From previous admission 07/27/13 IMPRESSION:  1. Advanced cirrhosis with evidence of either severe stenosis or  portal vein occlusion resulting in cavernous transformation in the  porta hepatis and numerous portosystemic collaterals, as above.  Associated with this, there is a massive splenomegaly, a small  volume of ascites and mild diffuse mesenteric edema.  2. Subtle haziness in the peripancreatic fat may be part of this  underlying state of soft tissue edema, however, clinical correlation  for signs and symptoms of pancreatitis is suggested.  3. Extensive colonic wall thickening, most pronounced in the region  of the ascending, transverse and descending colon. While this may  simply be related to an underlying state of anasarca, clinical  correlation for signs and symptoms of colitis is recommended.  4. Additional incidental findings, as above.   07/30/13 US abdomen IMPRESSION:  There is a small fluid collection in the right lower quadrant of the  abdomen. No large volume of ascites is demonstrated.   [name], MD 08/03/2013, 10:44 AM PGY-2, [place] [facility] FPTS Intern pager: [phone], text pages welcome

## 2013-08-04 DIAGNOSIS — K7290 Hepatic failure, unspecified without coma: Secondary | ICD-10-CM

## 2013-08-04 LAB — COMPREHENSIVE METABOLIC PANEL
ALT: 95 U/L — ABNORMAL HIGH (ref 0–35)
AST: 79 U/L — ABNORMAL HIGH (ref 0–37)
Alkaline Phosphatase: 340 U/L — ABNORMAL HIGH (ref 39–117)
CO2: 21 mEq/L (ref 19–32)
Chloride: 103 mEq/L (ref 96–112)
GFR calc Af Amer: >90 mL/min (ref >90)
GFR calc non Af Amer: >90 mL/min (ref >90)
Glucose, Bld: 252 mg/dL — ABNORMAL HIGH (ref 70–99)
Potassium: 3.4 mEq/L — ABNORMAL LOW (ref 3.5–5.1)
Sodium: 134 mEq/L — ABNORMAL LOW (ref 135–145)

## 2013-08-04 LAB — CBC
MCH: 34.9 pg — ABNORMAL HIGH (ref 26.0–34.0)
MCHC: 35.2 g/dL (ref 30.0–36.0)
MCV: 99.2 fL (ref 78.0–100.0)
RDW: 16.5 % — ABNORMAL HIGH (ref 11.5–15.5)

## 2013-08-04 LAB — AMMONIA: Ammonia: 63 umol/L — ABNORMAL HIGH (ref 11–60)

## 2013-08-04 MED ORDER — SODIUM CHLORIDE 0.9 % IV SOLN
INTRAVENOUS | Status: AC
  Administered 2013-08-04: 12:00:00 via INTRAVENOUS
  Filled 2013-08-04: qty 500

## 2013-08-04 MED ORDER — POTASSIUM CHLORIDE CRYS ER 20 MEQ PO TBCR: 20.0000 meq | EXTENDED_RELEASE_TABLET | Freq: Once | ORAL | Status: DC

## 2013-08-04 MED ORDER — FUROSEMIDE 80 MG PO TABS
80.0000 mg | ORAL_TABLET | Freq: Every day | ORAL | Status: DC
  Filled 2013-08-04: qty 1

## 2013-08-04 MED ORDER — MORPHINE SULFATE (CONCENTRATE) 10 MG /0.5 ML PO SOLN: 5.0000 mg | Freq: Three times a day (TID) | ORAL | Status: DC | PRN

## 2013-08-04 MED ORDER — SPIRONOLACTONE 100 MG PO TABS
200.0000 mg | ORAL_TABLET | Freq: Every day | ORAL | Status: DC
  Filled 2013-08-04: qty 2

## 2013-08-04 MED ORDER — ONDANSETRON 4 MG PO TBDP
4.0000 mg | ORAL_TABLET | Freq: Four times a day (QID) | ORAL | Status: DC | PRN
  Administered 2013-08-05 – 2013-08-06 (×2): 4 mg via ORAL
  Filled 2013-08-04 (×4): qty 1

## 2013-08-04 MED ORDER — PREDNISONE 20 MG PO TABS
40.0000 mg | ORAL_TABLET | Freq: Every day | ORAL | Status: DC
  Administered 2013-08-05 – 2013-08-10 (×6): 40 mg via ORAL
  Filled 2013-08-04 (×9): qty 2

## 2013-08-04 NOTE — Progress Notes (Signed)
08/04/13 nsg Per Ultrasound tech the [place] art/Ven abd pelv doppler procedure patient needs to be npo for 6-8 hours. They will do procedure tomorrow. MD on call notified via text.

## 2013-08-04 NOTE — Progress Notes (Signed)
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 Clinton Sawyer, MD, MBA 08/04/2013, 9:01 PM Family Medicine Resident, PGY-3 [phone] pager

## 2013-08-04 NOTE — Progress Notes (Signed)
Family Medicine Teaching Service Daily Progress Note Intern Pager: [phone]  Patient name: [name] Medical record number: [id number] Date of birth: May 28, 1986 Age: 27 y.o. Gender: female  Primary Care Provider: Levert Feinstein, MD Consultants: None Code Status: Full  Pt Overview and Major Events to Date:  12/27: Acute mental status change, asterixis   Assessment and Plan: [name] is a 27 y.o. female presenting with abdominal pain, nausea, fever. PMH is significant for primary sclerosing cholangitis (followed by Dr. Piedad Climes at [facility]), autoimmune hepatitis, seizure disorder, thrombocytopenia.   # Abdominal pain, vomiting, fever: likely related to pt's chronic primary sclerosing cholangitis. She was treated for a presumed UTI with keflex, which she did not tolerate by mouth. Was on zosyn which was then switched to CTX given no evidence of SBP. Cultures have all been neg so stopped CTX 12/25. CT abdomen showing questionable portal vein occlusion however review with Interventional radiology and they feel it is patent. An MRI MRCP at [facility] in November 30th demonstrated a "diminutive portal vein but no evidence of thrombosis" - Acute change in mental status with asterixis.  - Possible causes include but not limited to - elevated ammonia, over diuresis, portal vein thrombosis, infection, narcotics  - abd doppler, UA, Urine culture, blood culture, hold narcotics, hold diuretics, check ammonia, dc hydroxizine - Reglan, Zofran - Bcx from 07/30/13 showing no growth to date - avoid tylenol in setting of severe liver disease  - regular diet - started on solumedrol on 07/31/13 for symptomatic relief, will transition to PO prednisone and plan for long taper.   # Seizure disorder/shaking spells: continue home keppra, avoid tramadol (has been on pt's medication list as recently as last hospitalization) Shaking observed in clinic. more consistent with rigors than seizures. No evidence of  further episodes noted - continue to monitor  - seizure precautions   # Primary sclerosing cholangitis: tbili up to 20.2 but now improving; difficult to r/o ascending cholangitis based on labs given chronic dx. However no acute abdomen at this time and no fevers. - continue lactulose at home dose - continue home cholestyramine.  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   Subjective: Pain much improved and now tolerating PO food and fluid easily.  Pain medications causing her to be drowsy.   On re-examination with attending she is drowsy, arousable, but unable to participate in a conversation. Adjustments to her treatment as above.   Objective: Temp:  [97.8 F (36.6 C)-98.7 F (37.1 C)] 98.7 F (37.1 C) (12/27 0500) Pulse Rate:  [84-100] 94 (12/27 0500) Resp:  [18] 18 (12/27 0500) BP: (108-124)/(56-60) 108/58 mmHg (12/27 0500) SpO2:  [94 %-100 %] 94 % (12/27 0500)  Intake/Output Summary (Last 24 hours) at 08/04/13 0858 Last data filed at 08/04/13 0500  Gross per 24 hour  Intake    840 ml  Output   2900 ml  Net  -2060 ml  Physical Exam: General: Lying in bed, sleeping on her side, NAD, scleral icterus and sig jaundice Cardiovascular: RRR, 3/6 systolic murmur best heard at left sternal border appreciated Respiratory: CTAB, no wheezing Abdomen: obese, tender along the  right quadrant with indurated area, otherwise soft, markedly distended  Extremities: 3+ pitting edema up to thigh Neuro- drowsy, arousable, unable to repeat information explained to her, + asterixis  Laboratory:  Recent Labs Lab 08/01/13 1115 08/03/13 1153 08/04/13 0705  WBC 4.3 6.0 4.1  HGB 8.3* 8.9* 8.8*  HCT 23.4* 25.0* 25.0*  PLT 40* 44* 36*    Recent Labs Lab 07/31/13 0515 08/01/13 1115 08/03/13 1153  NA 128* 127* 130*  K 2.9* 4.1 3.9  CL 96 99 102  CO2 24 19 21   BUN 10 14 21   CREATININE 0.92 1.10 0.81  CALCIUM 7.4* 7.5* 7.9*  PROT 5.1* 5.5* 5.6*  BILITOT 18.9* 20.2* 16.6*  ALKPHOS 358* 403* 362*  ALT 83* 96* 102*  AST 140* 128* 89*  GLUCOSE 125* 260* 292*    Recent Labs Lab 07/29/13 0035 07/29/13 0200 07/30/13 1255 07/31/13 0515 08/01/13 1115 08/03/13 1153  AST 154*  --  167* 140* 128* 89*  ALT 91*  --  91* 83* 96* 102*  ALKPHOS 424*  --  381* 358* 403* 362*  BILITOT 19.3*  --  18.5* 18.9* 20.2* 16.6*  PROT 5.6*  --  5.2* 5.1* 5.5* 5.6*  ALBUMIN 1.6*  --  1.5* 1.4* 1.5* 1.5*  INR  --  2.09* 2.12*  --   --   --      UA large bili, sml hemoglobin Ucx: no growth Bcx negative to date  Imaging/Diagnostic Tests: From previous admission 07/27/13 IMPRESSION:  1. Advanced cirrhosis with evidence of either severe stenosis or  portal vein occlusion resulting in cavernous transformation in the  porta hepatis and numerous portosystemic collaterals, as above.  Associated with this, there is a massive splenomegaly, a small  volume of ascites and mild diffuse mesenteric edema.  2. Subtle haziness in the peripancreatic fat may be part of this  underlying state of soft tissue edema, however, clinical correlation  for signs and symptoms of pancreatitis is suggested.  3. Extensive colonic wall thickening, most pronounced in the region  of the ascending, transverse and descending colon. While this may  simply be related to an underlying state of anasarca,  clinical  correlation for signs and symptoms of colitis is recommended.  4. Additional incidental findings, as above.   07/30/13 US abdomen IMPRESSION:  There is a small fluid collection in the right lower quadrant of the  abdomen. No large volume of ascites is demonstrated.   [name], MD 08/04/2013, 8:58 AM PGY-2, [facility] FPTS Intern pager: [phone], text pages welcome

## 2013-08-04 NOTE — Progress Notes (Signed)
I have seen and examined this patient with Dr Ermalinda Memos.  I agree with their findings and plans as documented in their progress note.  Briefly, [name] is an unfortunate young woman with PSC with low albumin (1.4), elevated INR (2.1), recurrent hepatic encephalopathy who has shown a recent decline in well-being and function.   Currently, she is lethargic, arousing but slowly responsive to questions and commands.   She displays asterixis.  Will manage a worsening of her hepatic encephalopathy with bolus IV fluids, correct hypokalemia, stop diuretics for now, blood and urine cultures, hold sedative-hypnotics, anticholinergics, and opioids.  If patient unable to take Lactulose orally, would give per rectum 200 g (300 mL) diluted with 700 mL of water or NS via rectal balloon catheter; retain for 30-60 minutes; may repeat every 6 hours.

## 2013-08-05 ENCOUNTER — Inpatient Hospital Stay (HOSPITAL_COMMUNITY): Payer: Medicare Other

## 2013-08-05 DIAGNOSIS — R188 Other ascites: Secondary | ICD-10-CM

## 2013-08-05 LAB — COMPREHENSIVE METABOLIC PANEL WITH GFR
ALT: 102 U/L — ABNORMAL HIGH (ref 0–35)
AST: 87 U/L — ABNORMAL HIGH (ref 0–37)
Albumin: 1.4 g/dL — ABNORMAL LOW (ref 3.5–5.2)
Alkaline Phosphatase: 323 U/L — ABNORMAL HIGH (ref 39–117)
BUN: 23 mg/dL (ref 6–23)
CO2: 24 meq/L (ref 19–32)
Calcium: 8 mg/dL — ABNORMAL LOW (ref 8.4–10.5)
Chloride: 104 meq/L (ref 96–112)
Creatinine, Ser: 0.72 mg/dL (ref 0.50–1.10)
GFR calc Af Amer: >90 mL/min (ref >90)
GFR calc non Af Amer: >90 mL/min (ref >90)
Glucose, Bld: 142 mg/dL — ABNORMAL HIGH (ref 70–99)
Potassium: 4 meq/L (ref 3.5–5.1)
Sodium: 134 meq/L — ABNORMAL LOW (ref 135–145)
Total Bilirubin: 15 mg/dL — ABNORMAL HIGH (ref 0.3–1.2)
Total Protein: 5.2 g/dL — ABNORMAL LOW (ref 6.0–8.3)

## 2013-08-05 LAB — CBC
HCT: 25.4 % — ABNORMAL LOW (ref 36.0–46.0)
Hemoglobin: 9.1 g/dL — ABNORMAL LOW (ref 12.0–15.0)
MCH: 35.4 pg — ABNORMAL HIGH (ref 26.0–34.0)
MCHC: 35.8 g/dL (ref 30.0–36.0)
MCV: 98.8 fL (ref 78.0–100.0)
Platelets: 36 K/uL — ABNORMAL LOW (ref 150–400)
RBC: 2.57 MIL/uL — ABNORMAL LOW (ref 3.87–5.11)
RDW: 16.9 % — ABNORMAL HIGH (ref 11.5–15.5)
WBC: 4.6 K/uL (ref 4.0–10.5)

## 2013-08-05 LAB — CULTURE, BLOOD (ROUTINE X 2): Culture: NO GROWTH

## 2013-08-05 MED ORDER — LACTULOSE 10 GM/15ML PO SOLN
30.0000 g | Freq: Four times a day (QID) | ORAL | Status: DC
  Administered 2013-08-05 – 2013-08-08 (×10): 30 g via ORAL
  Administered 2013-08-08: 20 g via ORAL
  Administered 2013-08-08 – 2013-08-10 (×7): 30 g via ORAL
  Filled 2013-08-05 (×24): qty 45

## 2013-08-05 MED ORDER — SPIRONOLACTONE 100 MG PO TABS
100.0000 mg | ORAL_TABLET | Freq: Every day | ORAL | Status: DC
  Administered 2013-08-05: 100 mg via ORAL
  Filled 2013-08-05 (×2): qty 1

## 2013-08-05 MED ORDER — TORSEMIDE 20 MG PO TABS
20.0000 mg | ORAL_TABLET | Freq: Every day | ORAL | Status: DC
  Administered 2013-08-05: 20 mg via ORAL
  Filled 2013-08-05 (×2): qty 1

## 2013-08-05 MED ORDER — MORPHINE SULFATE (CONCENTRATE) 10 MG /0.5 ML PO SOLN
5.0000 mg | ORAL | Status: DC | PRN
  Administered 2013-08-05 – 2013-08-10 (×21): 5 mg via ORAL
  Filled 2013-08-05 (×21): qty 0.5

## 2013-08-05 MED ORDER — RIFAXIMIN 550 MG PO TABS
550.0000 mg | ORAL_TABLET | Freq: Two times a day (BID) | ORAL | Status: DC
  Administered 2013-08-05 – 2013-08-10 (×11): 550 mg via ORAL
  Filled 2013-08-05 (×12): qty 1

## 2013-08-05 NOTE — Progress Notes (Signed)
Family Medicine Teaching Service Daily Progress Note Intern Pager: [phone]  Patient name: [name] Medical record number: [id number] Date of birth: 1986-02-04 Age: 27 y.o. Gender: female  Primary Care Provider: Levert Feinstein, MD Consultants: None Code Status: Full  Pt Overview and Major Events to Date:  12/22: febrile, abdominal pain; CT possible port vein thrombosis, started Zosyn, CTX 12/25: CTX stopped   12/27: Acute mental status change, asterixis, NH3 63  Assessment and Plan: [name] is a 27 y.o. female presenting with abdominal pain, nausea, fever. PMH is significant for primary sclerosing cholangitis (followed by Dr. Piedad Climes at [facility]), autoimmune hepatitis, seizure disorder, thrombocytopenia. Improving pain and PO intake until development of encephalopathy 12/27.   # Encephalopathy - Likely hepatic vs sedation from poor metabolism of opioids that were taken   - Titrate lactulose to effect, move to 30 mg QID - Start rifaximin at 550 mg BID but beware of worsening fluid status - Overall this is a concerning development and if little improvement today will reconsult palliative care   # Anasarca - due to cirrhosis + hypoalbuminemia; difficult to assess intravascular vol status - All diuretic held yesterday and no improvement in MS, so restart with torsemide instead of furosemide given marked gut edema and cont spironolactone 100 mg  - Act more aggressively with IV meds if pt develops SOB  # Abdominal pain, vomiting, fever: likely related to pt's chronic primary sclerosing cholangitis. She was treated for a presumed UTI with keflex, which she did not tolerate by mouth. Was on zosyn which was then switched to CTX given no evidence of SBP. Cultures have all been neg so stopped CTX 12/25. CT abdomen showing questionable portal vein occlusion however review with Interventional radiology and they feel it is patent. An MRI MRCP at [facility] in November 30th demonstrated a  "diminutive portal vein but no evidence of thrombosis" - Unclear etiology of initial fever, check portal vein for thrombosis today with doppler  - started on solumedrol on 07/31/13 for symptomatic relief, will transition to PO prednisone and plan for long taper.   # Seizure disorder/shaking spells: continue home keppra, avoid tramadol (has been on pt's medication list as recently as last hospitalization) Shaking observed in clinic. more consistent with rigors than seizures. No evidence of further episodes noted - continue to monitor  - seizure precautions   # Primary sclerosing cholangitis: tbili up to 20.2 but now improving; difficult to r/o ascending cholangitis based on labs given chronic dx. However no acute abdomen at this time and no fevers. - continue lactulose at home dose - continue home cholestyramine.  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1 C)] 97.7 F (36.5 C) (12/27 2100) Pulse Rate:  [73-94] 77 (12/27 2100) Resp:  [18-20] 20 (12/27 2100) BP: (108-121)/(45-63) 121/63 mmHg (12/27 2100) SpO2:  [94 %-100 %] 96 % (12/27 2100) Weight:  [199 lb 11.2 oz (  90.583 kg)] 199 lb 11.2 oz (90.583 kg) (12/27 2100)  Intake/Output Summary (Last 24 hours) at 08/05/13 0041 Last data filed at 08/04/13 2335  Gross per 24 hour  Intake    620 ml  Output   1600 ml  Net   -980 ml     Physical Exam: General: Lying in bed, sleeping on her side, NAD, scleral icterus and sig jaundice Cardiovascular: RRR, 3/6 systolic murmur best heard at left sternal border appreciated Respiratory: CTAB, no wheezing Abdomen: obese, tender along the right quadrant with indurated area, otherwise soft, markedly distended  Extremities: diffuse anasarca of abdomen, back, thigh, LE Neuro- drowsy, arousable, unable to repeat information explained to her, + asterixis (unchanged from 1 day ago)  Laboratory:  Recent Labs Lab 08/01/13 1115 08/03/13 1153 08/04/13 0705  WBC 4.3 6.0 4.1  HGB 8.3* 8.9* 8.8*  HCT 23.4* 25.0* 25.0*  PLT 40* 44* 36*    Recent Labs Lab 08/01/13 1115 08/03/13 1153 08/04/13 0705  NA 127* 130* 134*  K 4.1 3.9 3.4*  CL 99 102 103  CO2 19 21 21   BUN 14 21 22   CREATININE 1.10 0.81 0.80  CALCIUM 7.5* 7.9* 8.0*  PROT 5.5* 5.6* 5.3*  BILITOT 20.2* 16.6* 15.2*  ALKPHOS 403* 362* 340*  ALT 96* 102* 95*  AST 128* 89* 79*  GLUCOSE 260* 292* 252*    Recent Labs Lab 07/29/13 0200 07/30/13 1255 07/31/13 0515 08/01/13 1115 08/03/13 1153 08/04/13 0705  AST  --  167* 140* 128* 89* 79*  ALT  --  91* 83* 96* 102* 95*  ALKPHOS  --  381* 358* 403* 362* 340*  BILITOT  --  18.5* 18.9* 20.2* 16.6* 15.2*  PROT  --  5.2* 5.1* 5.5* 5.6* 5.3*  ALBUMIN  --  1.5* 1.4* 1.5* 1.5* 1.4*  INR 2.09* 2.12*  --   --   --   --      UA large bili, sml hemoglobin Ucx: no growth Bcx negative to date  Imaging/Diagnostic Tests: From previous admission 07/27/13 IMPRESSION:  1. Advanced cirrhosis with evidence of either severe stenosis or  portal vein occlusion resulting in cavernous transformation in the  porta hepatis and numerous portosystemic collaterals, as above.  Associated with this, there is a massive splenomegaly, a small  volume of ascites and mild diffuse mesenteric edema.  2. Subtle haziness in the peripancreatic fat may be part of this  underlying state of  soft tissue edema, however, clinical correlation  for signs and symptoms of pancreatitis is suggested.  3. Extensive colonic wall thickening, most pronounced in the region  of the ascending, transverse and descending colon. While this may  simply be related to an underlying state of anasarca, clinical  correlation for signs and symptoms of colitis is recommended.  4. Additional incidental findings, as above.   07/30/13 US abdomen IMPRESSION:  There is a small fluid collection in the right lower quadrant of the  abdomen. No large volume of ascites is demonstrated.   Garnetta Buddy, MD 08/05/2013, 12:41 AM PGY-3, [facility] FPTS Intern pager: [phone], text pages welcome

## 2013-08-05 NOTE — Progress Notes (Signed)
FMTS Attending Daily Note: Denny Levy MD [phone] pager office [phone] I  have seen and examined this patient, reviewed their chart. I have discussed this patient with the resident. I agree with the resident's findings, assessment and care plan. She awakened this moring ad was lucid during my exam. Mom at  Bedside.

## 2013-08-06 DIAGNOSIS — R4182 Altered mental status, unspecified: Secondary | ICD-10-CM

## 2013-08-06 LAB — URINALYSIS, ROUTINE W REFLEX MICROSCOPIC
Glucose, UA: >1000 mg/dL — AB
Hgb urine dipstick: NEGATIVE
Ketones, ur: NEGATIVE mg/dL
Ketones, ur: NEGATIVE mg/dL
Leukocytes, UA: NEGATIVE
Leukocytes, UA: NEGATIVE
Nitrite: NEGATIVE
Nitrite: NEGATIVE
Protein, ur: NEGATIVE mg/dL
Specific Gravity, Urine: 1.014 (ref 1.005–1.030)
Urobilinogen, UA: 0.2 mg/dL (ref 0.0–1.0)
pH: 6 (ref 5.0–8.0)
pH: 6.5 (ref 5.0–8.0)

## 2013-08-06 LAB — COMPREHENSIVE METABOLIC PANEL
ALT: 105 U/L — ABNORMAL HIGH (ref 0–35)
AST: 86 U/L — ABNORMAL HIGH (ref 0–37)
Albumin: 1.4 g/dL — ABNORMAL LOW (ref 3.5–5.2)
Alkaline Phosphatase: 310 U/L — ABNORMAL HIGH (ref 39–117)
CO2: 21 mEq/L (ref 19–32)
Calcium: 7.7 mg/dL — ABNORMAL LOW (ref 8.4–10.5)
GFR calc Af Amer: >90 mL/min (ref >90)
GFR calc non Af Amer: >90 mL/min (ref >90)
Glucose, Bld: 210 mg/dL — ABNORMAL HIGH (ref 70–99)
Potassium: 3.1 mEq/L — ABNORMAL LOW (ref 3.5–5.1)
Sodium: 130 mEq/L — ABNORMAL LOW (ref 135–145)
Total Protein: 5.4 g/dL — ABNORMAL LOW (ref 6.0–8.3)

## 2013-08-06 LAB — URINE MICROSCOPIC-ADD ON

## 2013-08-06 MED ORDER — URSODIOL 300 MG PO CAPS
300.0000 mg | ORAL_CAPSULE | Freq: Two times a day (BID) | ORAL | Status: DC
  Administered 2013-08-06 – 2013-08-10 (×9): 300 mg via ORAL
  Filled 2013-08-06 (×10): qty 1

## 2013-08-06 MED ORDER — MORPHINE SULFATE 2 MG/ML IJ SOLN
1.0000 mg | Freq: Every evening | INTRAMUSCULAR | Status: DC | PRN
  Administered 2013-08-06 – 2013-08-08 (×3): 1 mg via INTRAVENOUS
  Filled 2013-08-06 (×3): qty 1

## 2013-08-06 MED ORDER — POTASSIUM CHLORIDE CRYS ER 20 MEQ PO TBCR
40.0000 meq | EXTENDED_RELEASE_TABLET | Freq: Two times a day (BID) | ORAL | Status: AC
  Administered 2013-08-06 (×2): 40 meq via ORAL
  Filled 2013-08-06 (×2): qty 2

## 2013-08-06 MED ORDER — HYDROXYZINE HCL 25 MG PO TABS
25.0000 mg | ORAL_TABLET | Freq: Three times a day (TID) | ORAL | Status: DC | PRN
  Administered 2013-08-06 – 2013-08-10 (×5): 25 mg via ORAL
  Filled 2013-08-06 (×5): qty 1

## 2013-08-06 MED ORDER — SPIRONOLACTONE 100 MG PO TABS
200.0000 mg | ORAL_TABLET | Freq: Every day | ORAL | Status: DC
  Administered 2013-08-06 – 2013-08-10 (×5): 200 mg via ORAL
  Filled 2013-08-06 (×5): qty 2

## 2013-08-06 MED ORDER — ONDANSETRON 4 MG PO TBDP
4.0000 mg | ORAL_TABLET | Freq: Once | ORAL | Status: AC
  Administered 2013-08-06: 4 mg via ORAL
  Filled 2013-08-06 (×2): qty 1

## 2013-08-06 MED ORDER — TORSEMIDE 20 MG PO TABS
40.0000 mg | ORAL_TABLET | Freq: Every day | ORAL | Status: DC
  Administered 2013-08-06 – 2013-08-07 (×2): 40 mg via ORAL
  Filled 2013-08-06 (×2): qty 2

## 2013-08-06 NOTE — Progress Notes (Signed)
Family Medicine Teaching Service Daily Progress Note Intern Pager: [phone]  Patient name: [name] Medical record number: [id number] Date of birth: 1986-07-10 Age: 27 y.o. Gender: female  Primary Care Provider: Levert Feinstein, MD Consultants: None Code Status: Full  Pt Overview and Major Events to Date:  12/22: febrile, abdominal pain; CT possible port vein thrombosis, started Zosyn, CTX 12/25: CTX stopped   12/27: Acute mental status change, asterixis, NH3 63  Assessment and Plan: [name] is a 27 y.o. female presenting with abdominal pain, nausea, fever. PMH is significant for primary sclerosing cholangitis (followed by Dr. Piedad Rojas at [facility]), autoimmune hepatitis, seizure disorder, thrombocytopenia. Improving pain and PO intake until development of encephalopathy 12/27.   # Encephalopathy - Likely hepatic vs sedation from poor metabolism of opioids that were taken - improved today, almost at baseline  - Titrate lactulose to effect, move to 30 mg QID - rifaximin 550 mg BID but beware of worsening fluid status - Re consulting palliative care today to homefully set up home hospice and ward off future admissions for expected mental status changes and other complications related to her declining health  # Anasarca - due to cirrhosis + hypoalbuminemia; difficult to assess intravascular vol status - increase torsemide to 40mg  and spironolactone to 200 mg  - Act more aggressively with IV meds if pt develops SOB  # Abdominal pain, vomiting, fever: likely related to pt's chronic primary sclerosing cholangitis. She was treated for a presumed UTI with keflex, which she did not tolerate by mouth. Was on zosyn which was then switched to CTX given no evidence of SBP. Cultures have all been neg so stopped CTX 12/25. CT abdomen showing questionable portal vein occlusion however review with Interventional radiology and they feel it is patent. An MRI MRCP at [facility] in November 30th  demonstrated a "diminutive portal vein but no evidence of thrombosis" - Liver doppler supported occluded portal vein, no intervention possible per IR and not a candidate for anticoag given liver function - continue PO prednisone and plan for long taper.   # Seizure disorder/shaking spells: continue home keppra, avoid tramadol (has been on pt's medication list as recently as last hospitalization) Shaking observed in clinic. more consistent with rigors than seizures. No evidence of further episodes noted - continue to monitor  - seizure precautions   # Primary sclerosing cholangitis: tbili up to 20.2 but now improving; difficult to r/o ascending cholangitis based on labs given chronic dx. However no acute abdomen at this time and no fevers. 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 of choice, but pt attesting to recently deciding to stop; UDS unfortunately + for cocaine and opiates - continue to encourage abstinence  - thorough discussion with pt during last admission the importance of remaining substance free in order to be listed for transplant   FEN/GI: SLIV, regular diet Prophylaxis: SCD's (platelets 40)  Disposition: pt to remain inpatient with encephalopathy   Subjective: Pt denies pain or shortness of breath; mental status seems to be about at baseline  Objective: Temp:  [97.2 F (36.2 C)-98.1 F (36.7 C)] 97.2 F (36.2 C) (12/29 0855) Pulse Rate:  [60-82] 60 (12/29 0855) Resp:  [16-18] 16 (12/29 0855) BP: (112-135)/(41-77) 129/59 mmHg (12/29 0855) SpO2:  [100 %] 100 % (12/29 0855) Weight:  [196 lb 7 oz (89.103 kg)] 196 lb 7 oz (89.103 kg) (12/29 0500)  Intake/Output  Summary (Last 24 hours) at 08/06/13 1231 Last data filed at 08/06/13 0855  Gross per 24 hour  Intake   1800 ml  Output   1550 ml  Net    250 ml    Physical Exam: General: Lying in bed,  sleeping on her side, NAD, scleral icterus and sig jaundice Cardiovascular: RRR, 3/6 systolic murmur best heard at left sternal border appreciated Respiratory: CTAB, no wheezing Abdomen: obese, tender along the right quadrant with indurated area, otherwise soft, markedly distended  Extremities: diffuse anasarca of abdomen, back, thigh, LE Neuro- AOx3  Laboratory:  Recent Labs Lab 08/03/13 1153 08/04/13 0705 08/05/13 1103  WBC 6.0 4.1 4.6  HGB 8.9* 8.8* 9.1*  HCT 25.0* 25.0* 25.4*  PLT 44* 36* 36*    Recent Labs Lab 08/04/13 0705 08/05/13 1103 08/06/13 0612  NA 134* 134* 130*  K 3.4* 4.0 3.1*  CL 103 104 100  CO2 21 24 21   BUN 22 23 17   CREATININE 0.80 0.72 0.75  CALCIUM 8.0* 8.0* 7.7*  PROT 5.3* 5.2* 5.4*  BILITOT 15.2* 15.0* 16.4*  ALKPHOS 340* 323* 310*  ALT 95* 102* 105*  AST 79* 87* 86*  GLUCOSE 252* 142* 210*    Recent Labs Lab 07/30/13 1255  08/01/13 1115 08/03/13 1153 08/04/13 0705 08/05/13 1103 08/06/13 0612  AST 167*  < > 128* 89* 79* 87* 86*  ALT 91*  < > 96* 102* 95* 102* 105*  ALKPHOS 381*  < > 403* 362* 340* 323* 310*  BILITOT 18.5*  < > 20.2* 16.6* 15.2* 15.0* 16.4*  PROT 5.2*  < > 5.5* 5.6* 5.3* 5.2* 5.4*  ALBUMIN 1.5*  < > 1.5* 1.5* 1.4* 1.4* 1.4*  INR 2.12*  --   --   --   --  3.04*  --   < > = values in this interval not displayed.   UA large bili, sml hemoglobin Ucx: no growth Bcx negative to date  Imaging/Diagnostic Tests: From previous admission 07/27/13 IMPRESSION:  1. Advanced cirrhosis with evidence of either severe stenosis or portal vein occlusion resulting in cavernous transformation in the porta hepatis and numerous portosystemic collaterals, as above. Associated with this, there is a massive splenomegaly, a small volume of ascites and mild diffuse mesenteric edema.  2. Subtle haziness in the peripancreatic fat may be part of this underlying state of soft tissue edema, however, clinical correlation for signs and symptoms of  pancreatitis is suggested.  3. Extensive colonic wall thickening, most pronounced in the region of the ascending, transverse and descending colon. While this may simply be related to an underlying state of anasarca, clinical correlation for signs and symptoms of colitis is recommended.  4. Additional incidental findings, as above.   07/30/13 US abdomen IMPRESSION:  There is a small fluid collection in the right lower quadrant of the abdomen. No large volume of ascites is demonstrated.   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 No intrahepatic portal vein flow was detected. The hepatic veins are attenuated with only a tiny middle hepatic vein felt to be identifiable.   Beverely Low, MD 08/06/2013, 12:31 PM PGY-1, [facility] FPTS Intern pager: [phone], text pages welcome

## 2013-08-06 NOTE — Progress Notes (Signed)
Family Practice Teaching Service Interval Progress Note  Spoke with Hepatology transfer attending at [facility]. He reviewed the patients recent chart from her admission to [facility] in November and we discussed her current hospitalization. He stated that she was not listable for transplant given her recent use of cocaine and that given this there would be nothing for them to offer at [facility] that we are not already providing here. He stated this most likely represents the progression of her disease and that she is likely in the process of dying.   I went to discuss with the patient that she would not be transferred to [facility] due to not being a transplant candidate. We had a brief discussion regarding the progression of her liver disease. She still holds out hope for a transplant and she talked about the steps she needs to take to have this done. She states she has already had 3 negative drug screens at [facility] and that it would require a total of 6 of these for her to be eligible for transplant. When I discussed her positive cocaine screen at the time of this admission she stated she had not used since that time and was unaware that this positive screen would likely reset her start time for needing negative tests for transplant. We will plan on touching base with the patients hepatologist, Dr Piedad Climes, tomorrow for further recommendations. Will continue current regimen of care at this time.  Marikay Alar, MD Family Medicine PGY-2 Service Pager [phone]

## 2013-08-06 NOTE — Progress Notes (Signed)
FMTS Attending  Note: Terri Eniola,MD I  have seen and examined this patient, reviewed their chart. I have discussed this patient with the resident. I agree with the resident's findings, assessment and care plan.  

## 2013-08-06 NOTE — Progress Notes (Addendum)
Patient JY:[id number] [name]      DOB: 02/21/1986      ZHY:[id number]  Reviewed case with patient's primary care MD who is rotating with our service this week.  Dr. Clifton Custard spoke with patient regarding current state of health.  Patient has been adamant about goals of care as curative.  This may not be a reversible state for her but she should be offered all options.  Patient was not open to hospice transition.  Recommended talk with [facility].  Note that [name] not willing to offer services at this time per note from Residents.  Will give [name] time to process and return to visit with her later today.   She is eligible for hospice care at this time as she was last time, but she was not willing to accept this transition.   [name] L. [name], MD MBA The Palliative Medicine Team at [facility] Phone: [phone] Pager: [phone]   Addendum: noted conversation reported by residents with [name] and plan to talk with Dr. Piedad Climes in am.  Will plan to let [name] process and see in the am.  Appreciate Medicines team efforts.   [name] L. [name], MD MBA The Palliative Medicine Team at [facility] Phone: [phone] Pager: [phone]

## 2013-08-06 NOTE — Progress Notes (Signed)
Family Medicine Teaching Service PCP Social Visit  Stopped by [facility] room to visit with her as she has now been in the hospital for one week. We discussed the progression of her disease. I did bring up the idea of hospice for her, and explained what hospice is and why she would likely be eligible for it. She became tearful, stating that no one had told her that her disease was this advanced. During our conversation her mother arrived and also participated in the discussion. Per Khadijatou's request, I spoke with her mother separately, and her grandmother [name] via phone. Nicolas's mother understands the concept of hospice but is resistant to hospice services at this time. Cyndal herself seemed to still be processing this information and wanted time to think it over with her mother and grandmother. She made it clear that she still is very much hoping for a transplant and is able to name off the things she has to do before she is eligible to be listed.  We also discussed the possibility of transfer to [facility] for greater specialty care, given her overall decline during this hospitalization (MELD score now 29), as this is where her primary hepatologist is located. Tonji, her mother, and her grandmother [name] are all in agreement that they would like to seek transfer to [facility]. Advised them that I would speak with our inpatient team to see if this process could be initiated.  I appreciate the excellent care the family medicine teaching service has provided for Rejeana. Please let me know if I can be of assistance in her care.  Levert Feinstein, MD Family Medicine PGY-2

## 2013-08-07 LAB — BASIC METABOLIC PANEL
BUN: 13 mg/dL (ref 6–23)
CO2: 30 mEq/L (ref 19–32)
Calcium: 8.1 mg/dL — ABNORMAL LOW (ref 8.4–10.5)
Chloride: 96 mEq/L (ref 96–112)
Creatinine, Ser: 0.82 mg/dL (ref 0.50–1.10)
GFR calc Af Amer: >90 mL/min (ref >90)
GFR calc non Af Amer: >90 mL/min (ref >90)
Glucose, Bld: 355 mg/dL — ABNORMAL HIGH (ref 70–99)
Potassium: 4.4 mEq/L (ref 3.7–5.3)
Sodium: 134 mEq/L — ABNORMAL LOW (ref 137–147)

## 2013-08-07 LAB — CBC
HCT: 26.4 % — ABNORMAL LOW (ref 36.0–46.0)
Hemoglobin: 9.3 g/dL — ABNORMAL LOW (ref 12.0–15.0)
MCHC: 35.2 g/dL (ref 30.0–36.0)
RBC: 2.68 MIL/uL — ABNORMAL LOW (ref 3.87–5.11)
WBC: 4.9 10*3/uL (ref 4.0–10.5)

## 2013-08-07 MED ORDER — FUROSEMIDE 10 MG/ML IJ SOLN
40.0000 mg | Freq: Once | INTRAMUSCULAR | Status: AC
  Administered 2013-08-07: 40 mg via INTRAVENOUS
  Filled 2013-08-07: qty 4

## 2013-08-07 MED ORDER — POTASSIUM CHLORIDE CRYS ER 20 MEQ PO TBCR
40.0000 meq | EXTENDED_RELEASE_TABLET | Freq: Once | ORAL | Status: AC
  Administered 2013-08-07: 40 meq via ORAL
  Filled 2013-08-07: qty 2

## 2013-08-07 MED ORDER — PHYTONADIONE 5 MG PO TABS
2.5000 mg | ORAL_TABLET | Freq: Once | ORAL | Status: AC
  Administered 2013-08-07: 2.5 mg via ORAL
  Filled 2013-08-07 (×2): qty 1

## 2013-08-07 MED ORDER — TORSEMIDE 20 MG PO TABS
40.0000 mg | ORAL_TABLET | Freq: Two times a day (BID) | ORAL | Status: DC
  Administered 2013-08-07 – 2013-08-10 (×6): 40 mg via ORAL
  Filled 2013-08-07 (×7): qty 2

## 2013-08-07 NOTE — Progress Notes (Signed)
Family Medicine Teaching Service Daily Progress Note Intern Pager: [phone]  Patient name: [name] Medical record number: [id number] Date of birth: 11-Jul-1986 Age: 27 y.o. Gender: female  Primary Care Provider: Levert Feinstein, MD Consultants: None Code Status: Full  Pt Overview and Major Events to Date:  12/22: febrile, abdominal pain; CT possible port vein thrombosis, started Zosyn, CTX 12/25: CTX stopped   12/27: Acute mental status change, asterixis, NH3 63 12/29: Palliative discussion with pt, pt not wanting hospice services;             Inpatient team discussion with [facility] for possible transfer; [facility] refusing transfer given pt not transplant candidate at this time and nothing further to do at this point. Relayed this information to pt.   Assessment and Plan: [name] is a 27 y.o. female presenting with abdominal pain, nausea, fever. PMH is significant for primary sclerosing cholangitis (followed by Dr. Piedad Climes at [facility]), autoimmune hepatitis, seizure disorder, thrombocytopenia. Improving pain and PO intake until development of encephalopathy 12/27.   # Encephalopathy - Likely hepatic vs sedation from poor metabolism of opioids that were taken - improved 12/29, currently at baseline.  - Titrate lactulose to effect, move to 30 mg QID. - rifaximin 550 mg BID but beware of worsening fluid status - Palliative team discussed with pt yesterday, who was resistant to hospice. Given [facility] not planning to take pt with her end-stage disease, will palliative to re-discuss with pt today. Hopefully set up home hospice and ward off future admissions for expected mental status changes and other complications related to her declining health. - Will also try to contact pt's hepatologist Dr Piedad Climes today for further recs.  # Anasarca - due to cirrhosis + hypoalbuminemia; difficult to assess intravascular vol status - increased torsemide to 40mg  and spironolactone to 200 mg 12/29. - Increasing  torsemide to 40mg  BID and adding lasix 40mg  IV x 1 12/30. - Add TED hose. - Monitor fluid status and BMET for Cr tomorrow. - Act more aggressively with IV meds if pt develops SOB.  # Abdominal pain, vomiting, fever: likely related to pt's chronic primary sclerosing cholangitis. She was treated for a presumed UTI with keflex, which she did not tolerate by mouth. Was on zosyn which was then switched to CTX given no evidence of SBP. Cultures have all been neg so stopped CTX 12/25. CT abdomen showing questionable portal vein occlusion however review with Interventional radiology and they feel it is patent. An MRI MRCP at [facility] November 30th demonstrated a "diminutive portal vein but no evidence of thrombosis" - Liver doppler supported occluded portal vein, no intervention possible per IR and not a candidate for anticoag given liver function. - continue PO prednisone and plan for long taper.   # Seizure disorder/shaking spells: continue home keppra, avoid tramadol (has been on pt's medication list as recently as last hospitalization) Shaking observed in clinic. more consistent with rigors than seizures. No evidence of further episodes noted. - continue to monitor  - seizure precautions   # Primary sclerosing cholangitis: tbili up to 20.2 but now improving; difficult to r/o ascending cholangitis based on labs given chronic dx. However no acute abdomen at this time and no fevers. - continue lactulose per above. - continue home cholestyramine, added ursodiol 12/29 for continued itching - thrombocytopenia - further decreased today. Will add vitamin K 2.5mg  PO x 1 and recheck CBC tomorrow.  # Drug use: cocaine is drug of choice, but pt attesting to recently deciding to stop; UDS  unfortunately + for cocaine and opiates - continue to encourage abstinence  - thorough discussion with pt during last admission the importance of remaining substance free in order to be listed for transplant   # Hypokalemia - 3.1  from 4 12/29. - Check Mg tomorrow; most recent check >10 days ago WNL. - On Kdur PO BID; Will add another x 1 today. - Check BMET tomorrow.  FEN/GI: SLIV, regular diet Prophylaxis: SCD's (platelets 23)  Disposition: pt to remain inpatient with encephalopathy and pending palliative care recs.  Subjective: Reports feeling increased swelling on legs and abdomen, usually has compression stockings at home but not here. Feels mentation better. Feels nausea from yesterday and overnight have improved. Denies dyspnea.   Objective: Temp:  [97.4 F (36.3 C)-97.8 F (36.6 C)] 97.4 F (36.3 C) (12/30 0900) Pulse Rate:  [72-82] 82 (12/30 0900) Resp:  [17-18] 18 (12/30 0900) BP: (116-138)/(53-92) 133/65 mmHg (12/30 0900) SpO2:  [100 %] 100 % (12/30 0900) Weight:  [196 lb 7 oz (89.103 kg)] 196 lb 7 oz (89.103 kg) (12/29 2138)  Intake/Output Summary (Last 24 hours) at 08/07/13 0942 Last data filed at 08/07/13 0656  Gross per 24 hour  Intake   1443 ml  Output   5300 ml  Net  -3857 ml    Physical Exam: General: Standing in room, NAD, scleral icterus and sig jaundice Cardiovascular: RRR Respiratory: CTAB, no wheezing Abdomen: obese, mildly tender along the right quadrant with indurated area, otherwise soft, distended  Extremities: diffuse anasarca of abdomen, back, thighs, LEs bilaterally; no calf tenderness or erythema. Neuro- AOx3, no focal deficits HEENT: Scleral icterus, EOMI  Laboratory:  Recent Labs Lab 08/04/13 0705 08/05/13 1103 08/07/13 0800  WBC 4.1 4.6 4.9  HGB 8.8* 9.1* 9.3*  HCT 25.0* 25.4* 26.4*  PLT 36* 36* 23*    Recent Labs Lab 08/04/13 0705 08/05/13 1103 08/06/13 0612  NA 134* 134* 130*  K 3.4* 4.0 3.1*  CL 103 104 100  CO2 21 24 21   BUN 22 23 17   CREATININE 0.80 0.72 0.75  CALCIUM 8.0* 8.0* 7.7*  PROT 5.3* 5.2* 5.4*  BILITOT 15.2* 15.0* 16.4*  ALKPHOS 340* 323* 310*  ALT 95* 102* 105*  AST 79* 87* 86*  GLUCOSE 252* 142* 210*     Recent Labs Lab 08/01/13 1115 08/03/13 1153 08/04/13 0705 08/05/13 1103 08/06/13 0612  AST 128* 89* 79* 87* 86*  ALT 96* 102* 95* 102* 105*  ALKPHOS 403* 362* 340* 323* 310*  BILITOT 20.2* 16.6* 15.2* 15.0* 16.4*  PROT 5.5* 5.6* 5.3* 5.2* 5.4*  ALBUMIN 1.5* 1.5* 1.4* 1.4* 1.4*  INR  --   --   --  3.04*  --      UA large bili, sml hemoglobin Ucx: no growth Bcx negative to date  Imaging/Diagnostic Tests: From previous admission 07/27/13 IMPRESSION:  1. Advanced cirrhosis with evidence of either severe stenosis or portal vein occlusion resulting in cavernous transformation in the porta hepatis and numerous portosystemic collaterals, as above. Associated with this, there is a massive splenomegaly, a small volume of ascites and mild diffuse mesenteric edema.  2. Subtle haziness in the peripancreatic fat may be part of this underlying state of soft tissue edema, however, clinical correlation for signs and symptoms of pancreatitis is suggested.  3. Extensive colonic wall thickening, most pronounced in the region of the ascending, transverse and descending colon. While this may simply be related to an underlying state of anasarca, clinical correlation for signs and  symptoms of colitis is recommended.  4. Additional incidental findings, as above.   07/30/13 US abdomen IMPRESSION:  There is a small fluid collection in the right lower quadrant of the abdomen. No large volume of ascites is demonstrated.   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 No intrahepatic portal vein flow was detected. The hepatic veins are attenuated with only a tiny middle hepatic vein felt to be identifiable.   Leona Singleton, MD 08/07/2013, 9:42 AM PGY-2, [facility] FPTS Intern pager: [phone], text  pages welcome

## 2013-08-07 NOTE — Progress Notes (Signed)
FMTS Attending  Note: Terri Hufstetler,MD I  have seen and examined this patient, reviewed their chart. I have discussed this patient with the resident. I agree with the resident's findings, assessment and care plan.  

## 2013-08-08 DIAGNOSIS — K769 Liver disease, unspecified: Secondary | ICD-10-CM

## 2013-08-08 DIAGNOSIS — K7210 Chronic hepatic failure without coma: Secondary | ICD-10-CM

## 2013-08-08 LAB — CBC
HCT: 30.2 % — ABNORMAL LOW (ref 36.0–46.0)
Hemoglobin: 10.6 g/dL — ABNORMAL LOW (ref 12.0–15.0)
MCH: 34.6 pg — ABNORMAL HIGH (ref 26.0–34.0)
MCHC: 35.1 g/dL (ref 30.0–36.0)
Platelets: 56 10*3/uL — ABNORMAL LOW (ref 150–400)
RBC: 3.06 MIL/uL — ABNORMAL LOW (ref 3.87–5.11)

## 2013-08-08 LAB — GLUCOSE, CAPILLARY: Glucose-Capillary: 384 mg/dL — ABNORMAL HIGH (ref 70–99)

## 2013-08-08 LAB — BASIC METABOLIC PANEL
BUN: 14 mg/dL (ref 6–23)
CO2: 34 mEq/L — ABNORMAL HIGH (ref 19–32)
Chloride: 92 mEq/L — ABNORMAL LOW (ref 96–112)
GFR calc non Af Amer: >90 mL/min (ref >90)
Glucose, Bld: 399 mg/dL — ABNORMAL HIGH (ref 70–99)
Potassium: 4.3 mEq/L (ref 3.7–5.3)
Sodium: 134 mEq/L — ABNORMAL LOW (ref 137–147)

## 2013-08-08 LAB — MAGNESIUM: Magnesium: 1.8 mg/dL (ref 1.5–2.5)

## 2013-08-08 MED ORDER — SULFAMETHOXAZOLE-TMP DS 800-160 MG PO TABS
1.0000 | ORAL_TABLET | Freq: Two times a day (BID) | ORAL | Status: DC
  Administered 2013-08-08 – 2013-08-09 (×3): 1 via ORAL
  Filled 2013-08-08 (×4): qty 1

## 2013-08-08 MED ORDER — FUROSEMIDE 10 MG/ML IJ SOLN
40.0000 mg | Freq: Once | INTRAMUSCULAR | Status: AC
  Administered 2013-08-08: 40 mg via INTRAVENOUS
  Filled 2013-08-08: qty 4

## 2013-08-08 MED ORDER — INSULIN ASPART 100 UNIT/ML ~~LOC~~ SOLN
0.0000 [IU] | Freq: Three times a day (TID) | SUBCUTANEOUS | Status: DC
Start: 2013-08-08 — End: 2013-08-10
  Administered 2013-08-08: 15 [IU] via SUBCUTANEOUS
  Administered 2013-08-08: 12 [IU] via SUBCUTANEOUS
  Administered 2013-08-09: 9 [IU] via SUBCUTANEOUS
  Administered 2013-08-09: 1 [IU] via SUBCUTANEOUS
  Administered 2013-08-09 – 2013-08-10 (×3): 5 [IU] via SUBCUTANEOUS

## 2013-08-08 NOTE — Progress Notes (Signed)
Patient WU:[id number] Gwinn      DOB: [birth year]      NWG:[id number]  Summary note of Consult; full note to follow:  Met with Lamiah and her mother.   Dr. Clifton Custard present.    Aline looks much better compared to last visit, up out of bed moving.  She has been compliant with TED hose and so has less peripheral edema.  She took some of our suggestions for journaling. She shared with [place] her goals remain unchanged- to complete her degree, remain drug free, mend fences with family, and try to make it to transplant.  It is up to [place] to try to help her do that. While I agree that she could be hospice eligible.  She may have a treatable cause for the current findings and so I reassert that it is of the utmost impprtance to talk with her GI attending at [facility] and potentially transfer her for treatment of her current findings to bridge her to the future.  She made the Dean's list and is responding better to the idea that hospice is availble but she is not at the point of excepting end of life care yet.  There have been people who have been excepted into hospice prior to transplant consideration and they have graduated, so this may not be an unreasonable choice.   I spoke with Dr. Benjamin Stain shortly after our meeting to update her on these issues.  Recommend:  1.  Full code  2.  Continue to treat aggressively the treatable issues  3.  Talk with her GI attending at [facility] about transfer to stabilize her current condition.  Total time.  530 -600 pm   Johne Buckle L. [name], MD MBA The Palliative Medicine Team at [facility] Phone: [phone] Pager: [phone]

## 2013-08-08 NOTE — Consult Note (Signed)
[facility] Consult: 4:06 PM 08/08/2013  LOS: 9 days    Referring Provider:   Primary Care Physician:  Terri Feinstein, MD Primary Gastroenterologist:  Dr. Arlyce Rojas, Dr [name] at [facility] (appt 09/21/13)   Reason for Consultation:  Management of portal vein thrombosis and overall prognosis.    HPI: [name] is a 27 y.o. female.  Hx of PSC/autoimmune hepatitis/cirrhosis. Thrombocytopenia and splenomegaly. Coagulopathy.  Followed at [facility] and by Dr [name].  Hx of esophageal variceal banding.  Last EGD of 07/2012 with grade 1 esophageal varices, continued on Nadolol. Meds also include Aldactone and Lasix.  ERCP by Dr [name] in 01/2010 showed changes of intrahepatic ducts c/w sclerosing cholangitis but no dominant strictures.  Chronic thrombocytopenia, splenomegaly. Ultrasound has shown cavernous transformation of the portal vein was highly tortuous portosystemic collaterals Nausea and vomiting in spring 2014 responded well to Zofran and Reglan. Gastric emptying scan 07/2013 showed 92% retention at 1 hour,  69% retention at 2 hours. She takes narcotics chronically.  Last GI office visit was 10/2012.  Per [name]'s notes she is due a surveillance egd in 07/2013.  Due to hx of ilicit drug use, she has not been deemed transplant candidate.   Admission 11/29 - 07/13/13 to [facility] with hepatic/opioid induced encephalopathy.  CT head negative. Transaminases were elevated to 296/116 on admission. Bilirubin was elevated to 13.9. They remained stable and were 230/133 on discharge, with a bilirubin of 12.4.  Platelets remained stable during admission near her baseline of 40.  Chronic macrocytic anemia attributed toalcohol abuse, splenomegaly, esophageal variceal bleeding. Nl B12, folate, TSH. Her hemoglobin remained stable during her  stay   Liver biopsy 07/11/13:  findings are suspicious for cirrhosis with the cholestasis suggestive of a possible duct obstruction. Sepsis is also in the differential. No significant evidence for steatohepatitis is appreciated   Ultrasound 07/07/13: Cirrhosis with varices compatible with portal venous hypertension.  -- Sludge versus artifact within the gallbladder with gallbladder wall thickening, likely due to chronic liver disease, similar to prior.  -- No sonographic evidence for acute cholecystitis.    MRI abdomen 07/08/2013:  No significant interval change from prior MRI from 9 days ago.  -- Limited MRCP portion due to patient motion. However, no stenosis/stricture in the mid-distal common bile duct.  -- Cirrhosis and portal hypertension, with numerous dilated left upper quadrant varices and splenorenal shunt, as above. Diminutive portal vein but no evidence of thrombosis.   Doppler studies 11/30 14:  1. Slow bidirectional portal venous flow with hepatofugal flow in the left portal vein. This is worsened since the previous 2009 study. At that time, all portal venous flow was hepatopedal with a documented main portal vein velocity of 0.25 m/sec.  2. Decreased phasicity of the hepatic veins, new. Left hepatic vein not identified, a new finding.   Hx of brain aneurysm and seizures.   Admission 12/11 - 07/26/13 to [facility] with AMS.  UDS + for cocaine. N/V.  + UTI treated with Keflex.   Re admitted 07/30/13 with fever, icterus, asterixis, AMS, anasarca, abdominal pain. [name]  Ammonia level 63.  Urine growing GPC, on Bactrim.  tox screen + for cocaine.  Repeat doppler studies 12/29 show no intrahepatic flow, consistent either with chronic portal vein occlusion with cavernous transformation or a severely attenuated portal vein with collateral development.  CT scan 12/19 shows  1. Advanced cirrhosis with evidence of either severe stenosis or  portal vein occlusion resulting in cavernous  transformation in the  porta hepatis and numerous portosystemic collaterals, as above.  Associated with this, there is a massive splenomegaly, a small  volume of ascites and mild diffuse mesenteric edema.  2. Subtle haziness in the peripancreatic fat may be part of this  underlying state of soft tissue edema, however, clinical correlation  for signs and symptoms of pancreatitis is suggested.  3. Extensive colonic wall thickening, most pronounced in the region  of the ascending, transverse and descending colon. While this may  simply be related to an underlying state of anasarca, clinical  correlation for signs and symptoms of colitis is recommended.    Past Medical History  Diagnosis Date  . Substance abuse April 2013    Cocaine plus THC usage - delisted from liver transplant list.   . Esophageal varices     distal esophageal grade 1 varices, portal gastropathy on EGDs in 01/2010 and 07/2012:  no banding or other intervention undertaken on EGDs from 2009 - 07/2012.    [name] Cirrhosis of liver   . Autoimmune hepatitis     Confirmed via liver biopsy 2002  . PSC (primary sclerosing cholangitis)   . Seizures     onset in 2009 associated with vent requiring resp failure.   . Thrombocytopenia     associated with cirrhosis.  followed by Dr [name]  . Intracranial hemorrhage 2009    in association with severe thrombocytopenia  . Heart murmur   . Anemia   . History of blood transfusion     "related to my liver" (07/30/2013)  . Headache(784.0)     "recently real bad headaches; take hydrocodone for them" (07/30/2013)  . Stroke 2009    "lost my peripheral vision" (07/30/2013)  . Anxiety   . Depression   . Primary sclerosing cholangitis     [name] 07/30/2013    Past Surgical History  Procedure Laterality Date  . Esophagogastroduodenoscopy  07/18/2012    Procedure: ESOPHAGOGASTRODUODENOSCOPY (EGD);  Surgeon: Terri Meckel, MD;  Location: [facility];  Service: Endoscopy;  Laterality:  N/A;  . Gastric varices banding  07/18/2012    Procedure: GASTRIC VARICES BANDING;  Surgeon: Terri Meckel, MD;  Location: [facility] ENDOSCOPY;  Service: Endoscopy;  Laterality: N/A;  . Ercp  01/2010    Sclerosing Cholangitis with no dominant stricures   . Liver biopsy  2002    Prior to Admission medications   Medication Sig Start Date End Date Taking? Authorizing Provider  acetaminophen (TYLENOL) 500 MG tablet Take 1 tablet (500 mg total) by mouth 2 (two) times daily as needed for headache. 07/26/13  Yes Glori Luis, MD  acidophilus (RISAQUAD) CAPS capsule Take 2 capsules by mouth daily.   Yes Historical Provider, MD  cholestyramine Lanetta Inch) 4 G packet Take 1 packet (4 g total) by mouth every 12 (twelve) hours. 07/26/13  Yes Glori Luis, MD  desloratadine (CLARINEX) 5 MG tablet Take 1 tablet (5 mg total) by mouth daily. 07/26/13  Yes Glori Luis, MD  EPINEPHrine (EPI-PEN) 0.3 mg/0.3 mL DEVI Inject 0.3 mg into the muscle once as needed (severe allergic reaction).  03/15/12  Yes Brent Bulla, MD  folic acid (FOLVITE) 1 MG tablet Take 1 mg by mouth daily.   Yes Historical Provider, MD  furosemide (LASIX) 40 MG tablet Take 2 tablets (80 mg total) by mouth daily. 07/26/13  Yes Glori Luis, MD  hydrOXYzine (ATARAX/VISTARIL) 25 MG tablet Take 50 mg by mouth daily as needed for itching.   Yes Historical Provider, MD  lactulose (CHRONULAC) 10 GM/15ML solution Take 30 mLs (20 g total) by mouth 2 (two) times daily as needed (to produce 2-3 bowel movements daily). 07/26/13  Yes Glori Luis, MD  levETIRAcetam (KEPPRA) 750 MG tablet Take 750 mg by mouth every 12 (twelve) hours.   Yes Historical Provider, MD  metoCLOPramide (REGLAN) 10 MG tablet Take 1 tablet (10 mg total) by mouth 3 (three) times daily before meals. 07/26/13  Yes Glori Luis, MD  nadolol (CORGARD) 20 MG tablet Take 20 mg by mouth at bedtime.    Yes Historical Provider, MD  ondansetron (ZOFRAN-ODT) 4 MG  disintegrating tablet Take 4 mg by mouth every 4 (four) hours as needed for nausea or vomiting.   Yes Historical Provider, MD  oxyCODONE (ROXICODONE) 5 MG immediate release tablet Take 1 tablet (5 mg total) by mouth every 6 (six) hours as needed for severe pain. 07/27/13  Yes Shanon Ace, MD  pantoprazole (PROTONIX) 40 MG tablet Take 40 mg by mouth 2 (two) times daily.   Yes Historical Provider, MD  promethazine (PHENERGAN) 25 MG suppository Place 0.5 suppositories (12.5 mg total) rectally every 6 (six) hours as needed for nausea or vomiting. 07/29/13  Yes Olivia Mackie, MD  promethazine (PHENERGAN) 25 MG tablet Take 0.5 tablets (12.5 mg total) by mouth every 6 (six) hours as needed for nausea. 07/29/13  Yes Olivia Mackie, MD  spironolactone (ALDACTONE) 100 MG tablet Take 2 tablets (200 mg total) by mouth daily. 07/26/13  Yes Glori Luis, MD    Scheduled Meds: . acidophilus  2 capsule Oral Daily  . cholestyramine  4 g Oral BID  . folic acid  1 mg Oral Daily  . insulin aspart  0-9 Units Subcutaneous TID WC  . lactulose  30 g Oral QID  . levETIRAcetam  750 mg Oral Q12H  . metoCLOPramide  10 mg Oral TID AC  . nadolol  20 mg Oral QHS  . pantoprazole  40 mg Oral BID  . predniSONE  40 mg Oral Q breakfast  . rifaximin  550 mg Oral BID  . sodium chloride  3 mL Intravenous Q12H  . sodium chloride  3 mL Intravenous Q12H  . spironolactone  200 mg Oral Daily  . sulfamethoxazole-trimethoprim  1 tablet Oral Q12H  . torsemide  40 mg Oral BID  . ursodiol  300 mg Oral BID   Infusions:   PRN Meds: sodium chloride, camphor-menthol, hydrOXYzine, morphine injection, morphine CONCENTRATE, ondansetron, sodium chloride   Allergies as of 07/30/2013 - Review Complete 07/30/2013  Allergen Reaction Noted  . Ibuprofen Shortness Of Breath     Family History  Problem Relation Age of Onset  . Cancer Neg Hx     History   Social History  . Marital Status: Single    Spouse Name: N/A    Number  of Children: 0  . Years of Education: N/A   Occupational History  .     Social History Main Topics  . Smoking status: Former Smoker -- 3 years    Types: Cigarettes  Quit date: 08/09/2005  . Smokeless tobacco: Never Used  . Alcohol Use: No  . Drug Use: Yes    Special: Cocaine     Comment: 07/30/2013 Pt used Cocaine last week   . Sexual Activity: Yes    Birth Control/ Protection: Condom   Other Topics Concern  . Not on file   Social History Narrative   Lives with her grandmother.     REVIEW OF SYSTEMS: Constitutional: weight gain since dischaarge from [facility].  Gained 21 # 12/20 to 12/30  ENT:  No nose bleeds, no oral bleeding Pulm:  No cough or dyspnea CV:  No palpitations, no LE edema.  GU:  No hematuria, no frequency GI:  Per HPI Heme:  Per HPI   Transfusions:  None recently Neuro:  No headaches, no peripheral tingling or numbness Derm:  No itching, no rash or sores. Some itching Endocrine:  No sweats or chills.  No polyuria or dysuria Immunization: hep A and B in 2013, flu shot 2014.   Travel:  None beyond local counties in last few months.    PHYSICAL EXAM: Vital signs in last 24 hours: Filed Vitals:   08/08/13 1336  BP: 128/60  Pulse: 78  Temp: 97.6 F (36.4 C)  Resp: 18   Wt Readings from Last 3 Encounters:  08/07/13 89.103 kg (196 lb 7 oz)  07/30/13 82.101 kg (181 lb)  07/28/13 79.379 kg (175 lb)    General: ill looking, jaundiced woman.  Looks old for age Head:  Facial edema  Eyes:  + icterus Ears:  Not HOH  Nose:  Congested, no rhinorrhea Mouth:  Good teeth, moist and clear MM Neck:  No mass or JVD Lungs:  Clear bil.  Unlabored breathing Heart: RRR.  No mrg Abdomen:  Soft, NT, ND, no mass or bruits.  Liver not palpable.  + splenomegaly.   Rectal: deferred   Musc/Skeltl: no joint swelling or contracures Extremities:  Diffuse anasarca in legs  Neurologic:  Oriented x 3.  Skin:  jaundiced Nodes: no cervical adenopathy   Psych:  Pleasant,  engaged, in good spirits.   Intake/Output from previous day: 12/30 0701 - 12/31 0700 In: 1320 [P.O.:1320] Out: 5350 [Urine:5350] Intake/Output this shift: Total I/O In: 600 [P.O.:600] Out: 1050 [Urine:1050]  LAB RESULTS:  Recent Labs  08/07/13 0800 08/08/13 0551  WBC 4.9 8.0  HGB 9.3* 10.6*  HCT 26.4* 30.2*  PLT 23* 56*   BMET Lab Results  Component Value Date   NA 134* 08/08/2013   NA 134* 08/07/2013   NA 130* 08/06/2013   K 4.3 08/08/2013   K 4.4 08/07/2013   K 3.1* 08/06/2013   CL 92* 08/08/2013   CL 96 08/07/2013   CL 100 08/06/2013   CO2 34* 08/08/2013   CO2 30 08/07/2013   CO2 21 08/06/2013   GLUCOSE 399* 08/08/2013   GLUCOSE 355* 08/07/2013   GLUCOSE 210* 08/06/2013   BUN 14 08/08/2013   BUN 13 08/07/2013   BUN 17 08/06/2013   CREATININE 0.79 08/08/2013   CREATININE 0.82 08/07/2013   CREATININE 0.75 08/06/2013   CALCIUM 8.6 08/08/2013   CALCIUM 8.1* 08/07/2013   CALCIUM 7.7* 08/06/2013   LFT  Recent Labs  08/06/13 0612  PROT 5.4*  ALBUMIN 1.4*  AST 86*  ALT 105*  ALKPHOS 310*  BILITOT 16.4*   PT/INR Lab Results  Component Value Date   INR 2.42* 08/08/2013   INR 3.04* 08/05/2013   INR 2.12* 07/30/2013   PROTIME 16.8*  06/20/2012   Lipase     Component Value Date/Time   LIPASE 18 07/29/2013 0035    Drugs of Abuse     Component Value Date/Time   LABOPIA POSITIVE* 07/30/2013 1642   COCAINSCRNUR POSITIVE* 07/30/2013 1642   COCAINSCRNUR NEG 06/28/2011 1556   LABBENZ NONE DETECTED 07/30/2013 1642   LABBENZ NEG 06/28/2011 1556   AMPHETMU NONE DETECTED 07/30/2013 1642   AMPHETMU NEG 06/28/2011 1556   THCU NONE DETECTED 07/30/2013 1642   LABBARB NONE DETECTED 07/30/2013 1642     RADIOLOGY STUDIES: 08/05/13  DUPLEX ULTRASOUND OF LIVER IMPRESSION:  Duplex ultrasound findings support CT findings and are consistent  either with chronic portal vein occlusion with cavernous  transformation or a severely attenuated portal vein with  collateral  development. No intrahepatic portal vein flow was detected. The  hepatic veins are attenuated with only a tiny middle hepatic vein  felt to be identifiable.  ENDOSCOPIC STUDIES: 07/2012  EGD  [name] For surveillance.  ENDOSCOPIC IMPRESSION:  1. grade 1 varices distal esophagus  RECOMMENDATIONS:  1. continue nadolol  2. followup endoscopy one year  01/2010  ERCP As above in HPI  IMPRESSION:   *  PSC, autoimmune hepatitis with advanced cirrhossis.  Portal vein occlusion  *  Substance abuse.  Not a transplant candidate because of this recurrent issue..    *  Encephaloopathy, due to opioids and not clear that with ammonia in 60s, how much her liver is contributing to recent AMS   *  Coagulopathy.   *  Splenomegaly, thrombocytopenia.   *  Normocytic anemia, stable  *  Abdominal pain. Chronic.   *  Known esophageal varices.  EGD with variceal ligation in past at [facility], 07/2012 EGD by Dr [name] with grade 1 varices.     PLAN:     *  Note that Palliative care consult is in process. This is an excellent idea.  Pt's overall prognosis is grim without a transplant.  *  Per Dr Leone Payor.  *  No role for anticaogulation.     Jennye Moccasin  08/08/2013, 4:07 PM Pager: [phone]    [facility] Attending  I have also seen and assessed the patient and agree with the above note. Extensive [facility] records reviewed as well as   My additions are:  1) She has liver failure and Mayo MELD 24 which means up to a 76% 3 month death rate 2) She seemed to be in denial that only hope is liver transplant 3) She is not completely honest about her drug and alcohol use 4) There is nothing else I know to offer her - I explained that her best hope is abstinence and possible liver transplant though that seems unlikely to happen given her substance use/addiction problems. 5) Agree no role for anti-coagulation - very common to get a chronic portal vein thrombosis with collaterals like she has 6)  Hard to know when her problems will kill her but this is a terminal illness unless she gets a transplant which is unlikely for a variety of reasons as above. Palliative care/hospice care reasonable but with youth and normal kidney function she may live beyond 6 months.  Iva Boop, MD, [facility] [phone] (pager) 08/08/2013 6:46 PM

## 2013-08-08 NOTE — Consult Note (Signed)
[facility] Consult: 4:06 PM 08/08/2013  LOS: 9 days    Referring Provider:   Primary Care Physician:  Terri Feinstein, MD Primary Gastroenterologist:  Terri. Arlyce Rojas, [name] at [facility] (appt 09/21/13)   Reason for Consultation:  Management of portal vein thrombosis and overall prognosis.    HPI: [name] is a 27 y.o. female.  Hx of PSC/autoimmune hepatitis/cirrhosis. Thrombocytopenia and splenomegaly. Coagulopathy.  Followed at [name] and by Terri [name].  Hx of esophageal variceal banding.  Last EGD of 07/2012 with grade 1 esophageal varices, continued on Nadolol. Meds also include Aldactone and Lasix.  ERCP by Terri [name] in 01/2010 showed changes of intrahepatic ducts c/w sclerosing cholangitis but no dominant strictures.  Chronic thrombocytopenia, splenomegaly. Ultrasound has shown cavernous transformation of the portal vein was highly tortuous portosystemic collaterals Nausea and vomiting in spring 2014 responded well to Zofran and Reglan. Gastric emptying scan 07/2013 showed 92% retention at 1 hour,  69% retention at 2 hours. She takes narcotics chronically.  Last GI office visit was 10/2012.  Per [name]'s notes she is due a surveillance egd in 07/2013.  Due to hx of ilicit drug use, she has not been deemed transplant candidate.   Admission 11/29 - 07/13/13 to [name] with hepatic/opioid induced encephalopathy.  CT head negative. Transaminases were elevated to 296/116 on admission. Bilirubin was elevated to 13.9. They remained stable and were 230/133 on discharge, with a bilirubin of 12.4.  Platelets remained stable during admission near her baseline of 40.  Chronic macrocytic anemia attributed toalcohol abuse, splenomegaly, esophageal variceal bleeding. Nl B12, folate, TSH. Her hemoglobin remained stable during her  stay   Liver biopsy 07/11/13:  findings are suspicious for cirrhosis with the cholestasis suggestive of a possible duct obstruction. Sepsis is also in the differential. No significant evidence for steatohepatitis is appreciated   Ultrasound 07/07/13: Cirrhosis with varices compatible with portal venous hypertension.  -- Sludge versus artifact within the gallbladder with gallbladder wall thickening, likely due to chronic liver disease, similar to prior.  -- No sonographic evidence for acute cholecystitis.    MRI abdomen 07/08/2013:  No significant interval change from prior MRI from 9 days ago.  -- Limited MRCP portion due to patient motion. However, no stenosis/stricture in the mid-distal common bile duct.  -- Cirrhosis and portal hypertension, with numerous dilated left upper quadrant varices and splenorenal shunt, as above. Diminutive portal vein but no evidence of thrombosis.   Doppler studies 11/30 14:  1. Slow bidirectional portal venous flow with hepatofugal flow in the left portal vein. This is worsened since the previous 2009 study. At that time, all portal venous flow was hepatopedal with a documented main portal vein velocity of 0.25 m/sec.  2. Decreased phasicity of the hepatic veins, new. Left hepatic vein not identified, a new finding.   Hx of brain aneurysm and seizures.   Admission 12/11 - 07/26/13 to [facility] with AMS.  UDS + for cocaine. N/V.  + UTI treated with Keflex.   Re admitted 07/30/13 with fever, icterus, asterixis, AMS, anasarca, abdominal pain. [name]  Ammonia level 63.  Urine growing GPC, on Bactrim.  tox screen + for cocaine.  Repeat doppler studies 12/29 show no intrahepatic flow, consistent either with chronic portal vein occlusion with cavernous transformation or a severely attenuated portal vein with collateral development.  CT scan 12/19 shows  1. Advanced cirrhosis with evidence of either severe stenosis or  portal vein occlusion resulting in cavernous  transformation in the  porta hepatis and numerous portosystemic collaterals, as above.  Associated with this, there is a massive splenomegaly, a small  volume of ascites and mild diffuse mesenteric edema.  2. Subtle haziness in the peripancreatic fat may be part of this  underlying state of soft tissue edema, however, clinical correlation  for signs and symptoms of pancreatitis is suggested.  3. Extensive colonic wall thickening, most pronounced in the region  of the ascending, transverse and descending colon. While this may  simply be related to an underlying state of anasarca, clinical  correlation for signs and symptoms of colitis is recommended.    Past Medical History  Diagnosis Date  . Substance abuse April 2013    Cocaine plus THC usage - delisted from liver transplant list.   . Esophageal varices     distal esophageal grade 1 varices, portal gastropathy on EGDs in 01/2010 and 07/2012:  no banding or other intervention undertaken on EGDs from 2009 - 07/2012.    [name] Cirrhosis of liver   . Autoimmune hepatitis     Confirmed via liver biopsy 2002  . PSC (primary sclerosing cholangitis)   . Seizures     onset in 2009 associated with vent requiring resp failure.   . Thrombocytopenia     associated with cirrhosis.  followed by [name]  . Intracranial hemorrhage 2009    in association with severe thrombocytopenia  . Heart murmur   . Anemia   . History of blood transfusion     "related to my liver" (07/30/2013)  . Headache(784.0)     "recently real bad headaches; take hydrocodone for them" (07/30/2013)  . Stroke 2009    "lost my peripheral vision" (07/30/2013)  . Anxiety   . Depression   . Primary sclerosing cholangitis     [name] 07/30/2013    Past Surgical History  Procedure Laterality Date  . Esophagogastroduodenoscopy  07/18/2012    Procedure: ESOPHAGOGASTRODUODENOSCOPY (EGD);  Surgeon: Terri Meckel, MD;  Location: [facility];  Service: Endoscopy;  Laterality:  N/A;  . Gastric varices banding  07/18/2012    Procedure: GASTRIC VARICES BANDING;  Surgeon: Terri Meckel, MD;  Location: [facility];  Service: Endoscopy;  Laterality: N/A;  . Ercp  01/2010    Sclerosing Cholangitis with no dominant stricures   . Liver biopsy  2002    Prior to Admission medications   Medication Sig Start Date End Date Taking? Authorizing Provider  acetaminophen (TYLENOL) 500 MG tablet Take 1 tablet (500 mg total) by mouth 2 (two) times daily as needed for headache. 07/26/13  Yes Glori Luis, MD  acidophilus (RISAQUAD) CAPS capsule Take 2 capsules by mouth daily.   Yes Historical Provider, MD  cholestyramine Lanetta Inch) 4 G packet Take 1 packet (4 g total) by mouth every 12 (twelve) hours. 07/26/13  Yes Glori Luis, MD  desloratadine (CLARINEX) 5 MG tablet Take 1 tablet (5 mg total) by mouth daily. 07/26/13  Yes Glori Luis, MD  EPINEPHrine (EPI-PEN) 0.3 mg/0.3 mL DEVI Inject 0.3 mg into the muscle once as needed (severe allergic reaction).  03/15/12  Yes Brent Bulla, MD  folic acid (FOLVITE) 1 MG tablet Take 1 mg by mouth daily.   Yes Historical Provider, MD  furosemide (LASIX) 40 MG tablet Take 2 tablets (80 mg total) by mouth daily. 07/26/13  Yes Glori Luis, MD  hydrOXYzine (ATARAX/VISTARIL) 25 MG tablet Take 50 mg by mouth daily as needed for itching.   Yes Historical Provider, MD  lactulose (CHRONULAC) 10 GM/15ML solution Take 30 mLs (20 g total) by mouth 2 (two) times daily as needed (to produce 2-3 bowel movements daily). 07/26/13  Yes Glori Luis, MD  levETIRAcetam (KEPPRA) 750 MG tablet Take 750 mg by mouth every 12 (twelve) hours.   Yes Historical Provider, MD  metoCLOPramide (REGLAN) 10 MG tablet Take 1 tablet (10 mg total) by mouth 3 (three) times daily before meals. 07/26/13  Yes Glori Luis, MD  nadolol (CORGARD) 20 MG tablet Take 20 mg by mouth at bedtime.    Yes Historical Provider, MD  ondansetron (ZOFRAN-ODT) 4 MG  disintegrating tablet Take 4 mg by mouth every 4 (four) hours as needed for nausea or vomiting.   Yes Historical Provider, MD  oxyCODONE (ROXICODONE) 5 MG immediate release tablet Take 1 tablet (5 mg total) by mouth every 6 (six) hours as needed for severe pain. 07/27/13  Yes Shanon Ace, MD  pantoprazole (PROTONIX) 40 MG tablet Take 40 mg by mouth 2 (two) times daily.   Yes Historical Provider, MD  promethazine (PHENERGAN) 25 MG suppository Place 0.5 suppositories (12.5 mg total) rectally every 6 (six) hours as needed for nausea or vomiting. 07/29/13  Yes Olivia Mackie, MD  promethazine (PHENERGAN) 25 MG tablet Take 0.5 tablets (12.5 mg total) by mouth every 6 (six) hours as needed for nausea. 07/29/13  Yes Olivia Mackie, MD  spironolactone (ALDACTONE) 100 MG tablet Take 2 tablets (200 mg total) by mouth daily. 07/26/13  Yes Glori Luis, MD    Scheduled Meds: . acidophilus  2 capsule Oral Daily  . cholestyramine  4 g Oral BID  . folic acid  1 mg Oral Daily  . insulin aspart  0-9 Units Subcutaneous TID WC  . lactulose  30 g Oral QID  . levETIRAcetam  750 mg Oral Q12H  . metoCLOPramide  10 mg Oral TID AC  . nadolol  20 mg Oral QHS  . pantoprazole  40 mg Oral BID  . predniSONE  40 mg Oral Q breakfast  . rifaximin  550 mg Oral BID  . sodium chloride  3 mL Intravenous Q12H  . sodium chloride  3 mL Intravenous Q12H  . spironolactone  200 mg Oral Daily  . sulfamethoxazole-trimethoprim  1 tablet Oral Q12H  . torsemide  40 mg Oral BID  . ursodiol  300 mg Oral BID   Infusions:   PRN Meds: sodium chloride, camphor-menthol, hydrOXYzine, morphine injection, morphine CONCENTRATE, ondansetron, sodium chloride   Allergies as of 07/30/2013 - Review Complete 07/30/2013  Allergen Reaction Noted  . Ibuprofen Shortness Of Breath     Family History  Problem Relation Age of Onset  . Cancer Neg Hx     History   Social History  . Marital Status: Single    Spouse Name: N/A    Number  of Children: 0  . Years of Education: N/A   Occupational History  .     Social History Main Topics  . Smoking status: Former Smoker -- 3 years    Types: Cigarettes  Quit date: 08/09/2005  . Smokeless tobacco: Never Used  . Alcohol Use: No  . Drug Use: Yes    Special: Cocaine     Comment: 07/30/2013 Pt used Cocaine last week   . Sexual Activity: Yes    Birth Control/ Protection: Condom   Other Topics Concern  . Not on file   Social History Narrative   Lives with her grandmother.     REVIEW OF SYSTEMS: Constitutional: weight gain since [facility]arge from [facility].  Gained 21 # 12/20 to 12/30  ENT:  No nose bleeds, no oral bleeding Pulm:  No cough or dyspnea CV:  No palpitations, no LE edema.  GU:  No hematuria, no frequency GI:  Per HPI Heme:  Per HPI   Transfusions:  None recently Neuro:  No headaches, no peripheral tingling or numbness Derm:  No itching, no rash or sores. Some itching Endocrine:  No sweats or chills.  No polyuria or dysuria Immunization: hep A and B in 2013, flu shot 2014.   Travel:  None beyond local counties in last few months.    PHYSICAL EXAM: Vital signs in last 24 hours: Filed Vitals:   08/08/13 1336  BP: 128/60  Pulse: 78  Temp: 97.6 F (36.4 C)  Resp: 18   Wt Readings from Last 3 Encounters:  08/07/13 89.103 kg (196 lb 7 oz)  07/30/13 82.101 kg (181 lb)  07/28/13 79.379 kg (175 lb)    General: ill looking, jaundiced woman.  Looks old for age Head:  Facial edema  Eyes:  + icterus Ears:  Not HOH  Nose:  Congested, no rhinorrhea Mouth:  Good teeth, moist and clear MM Neck:  No mass or JVD Lungs:  Clear bil.  Unlabored breathing Heart: RRR.  No mrg Abdomen:  Soft, NT, ND, no mass or bruits.  Liver not palpable.  + splenomegaly.   Rectal: deferred   Musc/Skeltl: no joint swelling or contracures Extremities:  Diffuse anasarca in legs  Neurologic:  Oriented x 3.  Skin:  jaundiced Nodes: no cervical adenopathy   Psych:  Pleasant,  engaged, in good spirits.   Intake/Output from previous day: 12/30 0701 - 12/31 0700 In: 1320 [P.O.:1320] Out: 5350 [Urine:5350] Intake/Output this shift: Total I/O In: 600 [P.O.:600] Out: 1050 [Urine:1050]  LAB RESULTS:  Recent Labs  08/07/13 0800 08/08/13 0551  WBC 4.9 8.0  HGB 9.3* 10.6*  HCT 26.4* 30.2*  PLT 23* 56*   BMET Lab Results  Component Value Date   NA 134* 08/08/2013   NA 134* 08/07/2013   NA 130* 08/06/2013   K 4.3 08/08/2013   K 4.4 08/07/2013   K 3.1* 08/06/2013   CL 92* 08/08/2013   CL 96 08/07/2013   CL 100 08/06/2013   CO2 34* 08/08/2013   CO2 30 08/07/2013   CO2 21 08/06/2013   GLUCOSE 399* 08/08/2013   GLUCOSE 355* 08/07/2013   GLUCOSE 210* 08/06/2013   BUN 14 08/08/2013   BUN 13 08/07/2013   BUN 17 08/06/2013   CREATININE 0.79 08/08/2013   CREATININE 0.82 08/07/2013   CREATININE 0.75 08/06/2013   CALCIUM 8.6 08/08/2013   CALCIUM 8.1* 08/07/2013   CALCIUM 7.7* 08/06/2013   LFT  Recent Labs  08/06/13 0612  PROT 5.4*  ALBUMIN 1.4*  AST 86*  ALT 105*  ALKPHOS 310*  BILITOT 16.4*   PT/INR Lab Results  Component Value Date   INR 2.42* 08/08/2013   INR 3.04* 08/05/2013   INR 2.12* 07/30/2013   PROTIME 16.8*  06/20/2012   Lipase     Component Value Date/Time   LIPASE 18 07/29/2013 0035    Drugs of Abuse     Component Value Date/Time   LABOPIA POSITIVE* 07/30/2013 1642   COCAINSCRNUR POSITIVE* 07/30/2013 1642   COCAINSCRNUR NEG 06/28/2011 1556   LABBENZ NONE DETECTED 07/30/2013 1642   LABBENZ NEG 06/28/2011 1556   AMPHETMU NONE DETECTED 07/30/2013 1642   AMPHETMU NEG 06/28/2011 1556   THCU NONE DETECTED 07/30/2013 1642   LABBARB NONE DETECTED 07/30/2013 1642     RADIOLOGY STUDIES: 08/05/13  DUPLEX ULTRASOUND OF LIVER IMPRESSION:  Duplex ultrasound findings support CT findings and are consistent  either with chronic portal vein occlusion with cavernous  transformation or a severely attenuated portal vein with  collateral  development. No intrahepatic portal vein flow was detected. The  hepatic veins are attenuated with only a tiny middle hepatic vein  felt to be identifiable.  ENDOSCOPIC STUDIES: 07/2012  EGD  [name] For surveillance.  ENDOSCOPIC IMPRESSION:  1. grade 1 varices distal esophagus  RECOMMENDATIONS:  1. continue nadolol  2. followup endoscopy one year  01/2010  ERCP As above in HPI  IMPRESSION:   *  PSC, autoimmune hepatitis with advanced cirrhossis.  Portal vein occlusion  *  Substance abuse.  Not a transplant candidate because of this recurrent issue..    *  Encephaloopathy, due to opioids and not clear that with ammonia in 60s, how much her liver is contributing to recent AMS   *  Coagulopathy.   *  Splenomegaly, thrombocytopenia.   *  Normocytic anemia, stable  *  Abdominal pain. Chronic.   *  Known esophageal varices.  EGD with variceal ligation in past at [facility], 07/2012 EGD by Terri [name] with grade 1 varices.     PLAN:     *  Note that Palliative care consult is in process. This is an excellent idea.  Pt's overall prognosis is grim without a transplant.  *  Per Terri Leone Payor.  *  No role for anticaogulation.     Jennye Moccasin  08/08/2013, 4:07 PM Pager: [phone]    [facility] Attending  I have also seen and assessed the patient and agree with the above note. Extensive [facility] records reviewed as well as   My additions are:  1) She has liver failure and Mayo MELD 24 which means up to a 76% 3 month death rate 2) She seemed to be in denial that only hope is liver transplant 3) She is not completely honest about her drug and alcohol use 4) There is nothing else I know to offer her - I explained that her best hope is abstinence and possible liver transplant though that seems unlikely to happen given her substance use/addiction problems. 5) Agree no role for anti-coagulation - very common to get a chronic

## 2013-08-08 NOTE — Progress Notes (Signed)
[facility] Attending  Note: Terri Callander,MD I  have seen and examined this patient, reviewed their chart. I have discussed this patient with the resident. I agree with the resident's findings, assessment and care plan.  

## 2013-08-08 NOTE — Progress Notes (Signed)
Family Medicine Teaching Service Daily Progress Note Intern Pager: [phone]  Patient name: Topaz Raglin Medical record number: [id number] Date of birth: May 30, 1986 Age: 27 y.o. Gender: female  Primary Care Provider: Levert Feinstein, MD Consultants: None Code Status: Full  Pt Overview and Major Events to Date:  12/22: febrile, abdominal pain; CT possible port vein thrombosis, started Zosyn, CTX 12/25: CTX stopped   12/27: Acute mental status change, asterixis, NH3 63 12/29: Palliative discussion with pt, pt not wanting hospice services;             Inpatient team [facility] with [facility] for [facility]; [facility] refusing transfer given pt not transplant candidate at this time and nothing further to do at this point. Relayed this information to pt.  12/30 and 12/31: Primary team attempting to contact hepatologist.           Abdominal pain and urine with gm pos cocci -->bactrim DS BID  Assessment and Plan: Maanya Hippert is a 27 y.o. female presenting with abdominal pain, nausea, fever. PMH is significant for primary sclerosing cholangitis (followed by Dr. Piedad Climes at [facility]), autoimmune hepatitis, seizure disorder, thrombocytopenia. Improving pain and PO intake until development of encephalopathy 12/27.   # Encephalopathy - Likely hepatic vs sedation from poor metabolism of opioids that were taken - improved 12/29, currently at baseline.  - Titrate lactulose to effect, move to 30 mg QID. - rifaximin 550 mg BID but beware of worsening fluid status - Palliative team discussed with pt 12/29-12/30, who was initially resistant to hospice but starting to become more accepting slowly. Hopefully set up home hospice and ward off future admissions for expected mental status changes and other complications related to her declining health. F/u further recs. [facility] on-call GI refused transfer; Attempted to contact Dr Harrel Lemon hepatology office, [facility] GI office (left 2 msgs), and pager (x2) but have not heard  back. Would consider GI consult here if unable to reach, to obtain recs on prognosis, PLT, INR, and ?portal vein thrombus.   # Anasarca - due to cirrhosis + hypoalbuminemia; difficult to assess intravascular vol status - increased torsemide to 40mg  and spironolactone to 200 mg 12/29. - Increased torsemide to 40mg  BID and added lasix 40mg  IV x 1 12/30 with mild improvement of LE edema; redosed 12/31 with stable renal function. - Added TED hose. - Monitor BMET and fluid status. - Act more aggressively with IV meds if pt develops SOB.  # Abdominal pain, vomiting, fever: likely related to pt's chronic primary sclerosing cholangitis. She was treated for a presumed UTI with keflex, which she did not tolerate by mouth. Was on zosyn which was then switched to CTX given no evidence of SBP. Cultures have all been neg so stopped CTX 12/25. CT abdomen showing questionable portal vein occlusion however review with Interventional radiology and they feel it is patent. An MRI MRCP at [facility] November 30th demonstrated a "diminutive portal vein but no evidence of thrombosis" - Liver doppler supported occluded portal vein, no intervention possible per IR and not a candidate for anticoag given liver function. - continue PO prednisone and plan for long taper.  - Will discuss imaging with Dr Piedad Climes to ascertain if a chronic or new finding (or consult [facility] if unable to reach Dr Piedad Climes). - Continued abd pain and nausea - will treat for urine growing gm pos cocci with bactrim DS 1 tab BID after discussion with pharm re: hepatic impairment; f/u cultures and hope to discontinue.  # Seizure disorder/shaking spells:  continue home keppra, avoid tramadol (has been on pt's medication list as recently as last hospitalization) Shaking observed in clinic. more consistent with rigors than seizures. No evidence of further episodes noted. - continue to monitor  - seizure precautions   # Primary sclerosing cholangitis: tbili up to  20.2 but now improving; difficult to r/o ascending cholangitis based on labs given chronic dx. However no acute abdomen at this time and no fevers. Very itchy.  MELD 16 12/31. INR decr to 2.4 from 3. - continue lactulose per above. - continue home cholestyramine, added ursodiol 12/29 for continued itching - thrombocytopenia - improved mildly after vitamin K 2.5mg  PO x 1 but in setting of portal vein thrombus will hold further dosing until discuss with [facility] GI.  - Recheck CBC tomorrow. - Monitor itching for now, possibly add home desloratadine tomorrow if still itchy.  # Drug use: cocaine is drug of choice, but pt attesting to recently deciding to stop; UDS unfortunately + for cocaine and opiates - continue to encourage abstinence  - thorough discussion with pt during last admission the importance of remaining substance free in order to be listed for transplant   # Hypokalemia - 3.1 from 4 12/29.  Mg WNL, resolved after Kdur. - Check BMET again in AM.  # Hyperglycemia - 300s last 2 days. - Will order SSI sensitive and monitor.  # Finger stiffness - New complaint today, no obvious trigger. Strength wnl. - Monitor and f/u as oupatient with possible imaging.  FEN/GI: SLIV, regular diet Prophylaxis: SCD's (platelets 23>>50s)  Disposition: pt to remain inpatient pending discussion re: portal vein thrombus and palliative care recs.  Subjective: Feeling all right, lots of itchiness, denies shortness of breath.  Objective: Temp:  [97.4 F (36.3 C)-98.1 F (36.7 C)] 97.9 F (36.6 C) (12/31 0416) Pulse Rate:  [73-82] 82 (12/31 0416) Resp:  [18] 18 (12/31 0416) BP: (110-133)/(50-68) 133/68 mmHg (12/31 0416) SpO2:  [100 %] 100 % (12/31 0416) Weight:  [196 lb 7 oz (89.103 kg)] 196 lb 7 oz (89.103 kg) (12/30 2034)  Intake/Output Summary (Last 24 hours) at 08/08/13 0726 Last data filed at 08/08/13 0416  Gross per 24 hour  Intake   1320 ml  Output   5350 ml  Net  -4030 ml    Physical  Exam:  General: Seated, NAD Cardiovascular: RRR with II/VI systolic murmur. Respiratory: CTAB, no wheezing Abdomen: obese, mildly tender along the right quadrant with indurated area, otherwise soft, distended  Extremities: diffuse anasarca of abdomen, back, thighs, LEs bilaterally, mildly decreased from yesterday; no calf tenderness or erythema. Neuro- AOx3, no focal deficits HEENT: Scleral icterus, EOMI  Laboratory:  Recent Labs Lab 08/05/13 1103 08/07/13 0800 08/08/13 0551  WBC 4.6 4.9 8.0  HGB 9.1* 9.3* 10.6*  HCT 25.4* 26.4* 30.2*  PLT 36* 23* 56*    Recent Labs Lab 08/04/13 0705 08/05/13 1103 08/06/13 0612 08/07/13 0800 08/08/13 0551  NA 134* 134* 130* 134* 134*  K 3.4* 4.0 3.1* 4.4 4.3  CL 103 104 100 96 92*  CO2 21 24 21 30  34*  BUN 22 23 17 13 14   CREATININE 0.80 0.72 0.75 0.82 0.79  CALCIUM 8.0* 8.0* 7.7* 8.1* 8.6  PROT 5.3* 5.2* 5.4*  --   --   BILITOT 15.2* 15.0* 16.4*  --   --   ALKPHOS 340* 323* 310*  --   --   ALT 95* 102* 105*  --   --   AST 79* 87* 86*  --   --  GLUCOSE 252* 142* 210* 355* 399*    Recent Labs Lab 08/01/13 1115 08/03/13 1153 08/04/13 0705 08/05/13 1103 08/06/13 0612 08/08/13 0551  AST 128* 89* 79* 87* 86*  --   ALT 96* 102* 95* 102* 105*  --   ALKPHOS 403* 362* 340* 323* 310*  --   BILITOT 20.2* 16.6* 15.2* 15.0* 16.4*  --   PROT 5.5* 5.6* 5.3* 5.2* 5.4*  --   ALBUMIN 1.5* 1.5* 1.4* 1.4* 1.4*  --   INR  --   --   --  3.04*  --  2.42*     UA large bili, sml hemoglobin Ucx: no growth Bcx negative to date  Imaging/Diagnostic Tests: From previous admission 07/27/13 IMPRESSION:  1. Advanced cirrhosis with evidence of either severe stenosis or portal vein occlusion resulting in cavernous transformation in the porta hepatis and numerous portosystemic collaterals, as above. Associated with this, there is a massive splenomegaly, a small volume of ascites and mild diffuse mesenteric edema.  2. Subtle haziness in the  peripancreatic fat may be part of this underlying state of soft tissue edema, however, clinical correlation for signs and symptoms of pancreatitis is suggested.  3. Extensive colonic wall thickening, most pronounced in the region of the ascending, transverse and descending colon. While this may simply be related to an underlying state of anasarca, clinical correlation for signs and symptoms of colitis is recommended.  4. Additional incidental findings, as above.   07/30/13 US abdomen IMPRESSION:  There is a small fluid collection in the right lower quadrant of the abdomen. No large volume of ascites is demonstrated.   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 No intrahepatic portal vein flow was detected. The hepatic veins are attenuated with only a tiny middle hepatic vein felt to be identifiable.   Leona Singleton, MD 08/08/2013, 7:26 AM PGY-2, [facility] FPTS Intern pager: [phone], text pages welcome

## 2013-08-09 LAB — GLUCOSE, CAPILLARY
GLUCOSE-CAPILLARY: 146 mg/dL — AB (ref 70–99)
GLUCOSE-CAPILLARY: 366 mg/dL — AB (ref 70–99)
Glucose-Capillary: 267 mg/dL — ABNORMAL HIGH (ref 70–99)
Glucose-Capillary: 288 mg/dL — ABNORMAL HIGH (ref 70–99)

## 2013-08-09 LAB — BASIC METABOLIC PANEL
BUN: 13 mg/dL (ref 6–23)
CO2: 37 mEq/L — ABNORMAL HIGH (ref 19–32)
Calcium: 8.5 mg/dL (ref 8.4–10.5)
Chloride: 86 mEq/L — ABNORMAL LOW (ref 96–112)
Creatinine, Ser: 0.5 mg/dL (ref 0.50–1.10)
GFR calc Af Amer: >90 mL/min (ref >90)
GFR calc non Af Amer: >90 mL/min (ref >90)
Glucose, Bld: 146 mg/dL — ABNORMAL HIGH (ref 70–99)
Potassium: 4 mEq/L (ref 3.7–5.3)
Sodium: 132 mEq/L — ABNORMAL LOW (ref 137–147)

## 2013-08-09 LAB — URINE CULTURE: Colony Count: 50000

## 2013-08-09 LAB — CBC
HCT: 29.7 % — ABNORMAL LOW (ref 36.0–46.0)
Hemoglobin: 10.6 g/dL — ABNORMAL LOW (ref 12.0–15.0)
MCH: 34.6 pg — ABNORMAL HIGH (ref 26.0–34.0)
MCHC: 35.7 g/dL (ref 30.0–36.0)
MCV: 97.1 fL (ref 78.0–100.0)
Platelets: 44 10*3/uL — ABNORMAL LOW (ref 150–400)
RBC: 3.06 MIL/uL — ABNORMAL LOW (ref 3.87–5.11)
RDW: 16.4 % — ABNORMAL HIGH (ref 11.5–15.5)
WBC: 11.6 10*3/uL — ABNORMAL HIGH (ref 4.0–10.5)

## 2013-08-09 LAB — HEMOGLOBIN A1C
Hgb A1c MFr Bld: 5.1 % (ref <5.7)
Mean Plasma Glucose: 100 mg/dL (ref <117)

## 2013-08-09 LAB — PROTIME-INR
INR: 2.48 — ABNORMAL HIGH (ref 0.00–1.49)
Prothrombin Time: 26 seconds — ABNORMAL HIGH (ref 11.6–15.2)

## 2013-08-09 NOTE — Progress Notes (Signed)
Family Medicine Teaching Service Daily Progress Note Intern Pager: [phone]  Patient name: Elisha PonderWhitney Abernethy Medical record number: [id number] Date of birth: 16-Nov-1985 Age: 28 y.o. Gender: female  Primary Care Provider: Levert FeinsteinMcIntyre, Brittany, MD Consultants: None Code Status: Full  Pt Overview and Major Events to Date:  12/22: febrile, abdominal pain; CT possible port vein thrombosis, started Zosyn, CTX 12/25: CTX stopped   12/27: Acute mental status change, asterixis, Ammonia 63 12/29: Palliative discussion with pt, pt not wanting hospice services;            [facility]; [facility] given pt not transplant            candidate at this time and nothing further to do at this point. Relayed this information to pt.  12/30 and 12/31: Primary team attempting to contact hepatologist.           Abdominal pain and urine with gm pos cocci -->bactrim DS BID 08/09/13 - Metabolic alkalosis noted on BMP.  Assessment and Plan: Elisha PonderWhitney Dougher is a 28 y.o. female presenting with abdominal pain, nausea, fever. PMH is significant for primary sclerosing cholangitis ([facility]), autoimmune hepatitis, seizure disorder, thrombocytopenia.  # Encephalopathy  - Now resolved.  - Continue Lactulose 30 mg QID, Rifaximin 550 mg BID. - Palliative team discussed with pt 12/29-12/30, who was initially resistant to hospice but starting to become more accepting slowly. Hopefully set up home hospice and ward off future admissions for expected mental status changes and other complications related to her declining health.  - Palliative to continue to follow.  [facility] on-call GI refused transfer; Attempted to contact Dr Harrel Lemonarling's hepatology office, [facility] (left 2 msgs), and pager (x2) but have not heard back. - GI consulted here.  Recs:  No additional therapies or interventions to offer other than drug abstinence and transplant  # Anasarca - due to  cirrhosis + hypoalbuminemia - increased torsemide to 40mg  and spironolactone to 200 mg 12/29. - Increased torsemide to 40mg  BID and added lasix 40mg  IV x 1 12/30 with mild improvement of LE edema; redosed 12/31 with stable renal function. - Continue TED Hose.  Continue Toresemide BID.  No additional Lasix today as Anasarca appears improved on exam and patient is down 9 L for admission. - Monitor BMET and fluid status.  # Abdominal pain, vomiting, fever: likely related to pt's chronic primary sclerosing cholangitis. She was treated for a presumed UTI with keflex, which she did not tolerate by mouth. Was on zosyn which was then switched to CTX given no evidence of SBP. Cultures have all been neg so stopped CTX 12/25. CT abdomen showing questionable portal vein occlusion however review with Interventional radiology and they feel it is patent. [facility] demonstrated a "diminutive portal vein but no evidence of thrombosis". Liver doppler supported occluded portal vein, no intervention possible per IR and not a candidate for anticoag given liver function. - continue PO prednisone.   - Patient currently afebrile - Continued abd pain and nausea - will treat for urine growing gm pos cocci with bactrim DS 1 tab BID.  Pain control: Morphine PRN.  - Will follow cultures.  - No treatment (per GI) for portal vein thrombosis  # Seizure disorder/shaking spells - continue home keppra - seizure precautions   # Primary sclerosing cholangitis and secondary liver cirrhosis.  MELD 16 12/31. INR decr to 2.4 from 3. - continue  lactulose per above. - continue cholestyramine and Ursodiol; continue prednisone - thrombocytopenia - stable. Will continue to follow.  - GI and palliative following - Continue Nadolol for known history of varices  # Metabolic alkalosis - CO2 37 - Likely secondary to diuresis and hyperaldosteronism (from cirrhosis). - No additional IV diuretics today - Will continue  to monitor closely   # Drug use: cocaine is drug of choice, but pt attesting to recently deciding to stop; UDS + for cocaine and opiates - continue to encourage abstinence   # Hypokalemia - Currently resolved.  Will continue to monitor closely.   # Hyperglycemia  - Sensitive SSI - CBG well controlled this am - 146.  FEN/GI: SLIV, regular diet Prophylaxis: SCD's  Disposition: Pending clinical improvement.   Subjective: Patient continues to have abdominal pain. No other complaints this am.   Tolerating PO. No chest pain, SOB.   Objective: Temp:  [97.6 F (36.4 C)-98.6 F (37 C)] 98.6 F (37 C) (01/01 0823) Pulse Rate:  [73-94] 82 (01/01 0823) Resp:  [16-18] 18 (01/01 0823) BP: (114-142)/(52-79) 114/63 mmHg (01/01 0823) SpO2:  [98 %-100 %] 98 % (01/01 0823) Weight:  [195 lb 5.2 oz (88.6 kg)] 195 lb 5.2 oz (88.6 kg) (12/31 2025)  Intake/Output Summary (Last 24 hours) at 08/09/13 0837 Last data filed at 08/09/13 0543  Gross per 24 hour  Intake   1080 ml  Output   3050 ml  Net  -1970 ml    Physical Exam:  General: chronically ill appearing young female in NAD.  HEENT: Scleral icterus noted.  Cardiovascular: RRR with II/VI systolic murmur. Respiratory: CTAB, no wheezing Abdomen: obese, mildly tender along the right quadrant; soft, distended.  Extremities: trace LE edema this am; TED hose applied.   Neuro- AOx3, no focal deficits  Laboratory:  Recent Labs Lab 08/07/13 0800 08/08/13 0551 08/09/13 0432  WBC 4.9 8.0 11.6*  HGB 9.3* 10.6* 10.6*  HCT 26.4* 30.2* 29.7*  PLT 23* 56* 44*    Recent Labs Lab 08/04/13 0705 08/05/13 1103 08/06/13 0612 08/07/13 0800 08/08/13 0551 08/09/13 0432  NA 134* 134* 130* 134* 134* 132*  K 3.4* 4.0 3.1* 4.4 4.3 4.0  CL 103 104 100 96 92* 86*  CO2 21 24 21 30  34* 37*  BUN 22 23 17 13 14 13   CREATININE 0.80 0.72 0.75 0.82 0.79 0.50  CALCIUM 8.0* 8.0* 7.7* 8.1* 8.6 8.5  PROT 5.3* 5.2* 5.4*  --   --   --   BILITOT 15.2*  15.0* 16.4*  --   --   --   ALKPHOS 340* 323* 310*  --   --   --   ALT 95* 102* 105*  --   --   --   AST 79* 87* 86*  --   --   --   GLUCOSE 252* 142* 210* 355* 399* 146*    Recent Labs Lab 08/03/13 1153 08/04/13 0705 08/05/13 1103 08/06/13 0612 08/08/13 0551 08/09/13 0432  AST 89* 79* 87* 86*  --   --   ALT 102* 95* 102* 105*  --   --   ALKPHOS 362* 340* 323* 310*  --   --   BILITOT 16.6* 15.2* 15.0* 16.4*  --   --   PROT 5.6* 5.3* 5.2* 5.4*  --   --   ALBUMIN 1.5* 1.4* 1.4* 1.4*  --   --   INR  --   --  3.04*  --  2.42* 2.48*  UA large bili, sml hemoglobin Ucx: Gram + cocci Bcx negative to date  Imaging/Diagnostic Tests: From previous admission 07/27/13 IMPRESSION:  1. Advanced cirrhosis with evidence of either severe stenosis or portal vein occlusion resulting in cavernous transformation in the porta hepatis and numerous portosystemic collaterals, as above. Associated with this, there is a massive splenomegaly, a small volume of ascites and mild diffuse mesenteric edema.  2. Subtle haziness in the peripancreatic fat may be part of this underlying state of soft tissue edema, however, clinical correlation for signs and symptoms of pancreatitis is suggested.  3. Extensive colonic wall thickening, most pronounced in the region of the ascending, transverse and descending colon. While this may simply be related to an underlying state of anasarca, clinical correlation for signs and symptoms of colitis is recommended.  4. Additional incidental findings, as above.   07/30/13 US abdomen IMPRESSION:  There is a small fluid collection in the right lower quadrant of the abdomen. No large volume of ascites is demonstrated.   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 No intrahepatic portal vein flow was  detected. The hepatic veins are attenuated with only a tiny middle hepatic vein felt to be identifiable.   Tommie Sams, DO 08/09/2013, 8:37 AM PGY-2, [place] [facility] FPTS Intern pager: [phone], text pages welcome

## 2013-08-09 NOTE — ED Provider Notes (Signed)
I saw and evaluated the patient, reviewed the resident's note and I agree with the findings and plan.  EKG Interpretation    Date/Time:  Friday July 27 2013 15:30:36 EST Ventricular Rate:  89 PR Interval:  139 QRS Duration: 101 QT Interval:  380 QTC Calculation: 462 R Axis:   58 Text Interpretation:  Sinus rhythm No significant change since last tracing Confirmed by Jearl Soto  MD, Unnamed Hino ([id number]) on 07/27/2013 3:42:54 PM           Patient with extensive workup without any acute findings. Patient with recent admission for hepatic encephalopathy. Patient here with the mild abdominal pain. Workup without any acute findings. Patient stable for discharge home cholesterol up with primary care Dr.   Results for orders placed during the hospital encounter of 07/27/13  LIPASE, BLOOD      Result Value Range   Lipase 28  11 - 59 U/L  COMPREHENSIVE METABOLIC PANEL      Result Value Range   Sodium 123 (*) 135 - 145 mEq/L   Potassium 4.2  3.5 - 5.1 mEq/L   Chloride 94 (*) 96 - 112 mEq/L   CO2 22  19 - 32 mEq/L   Glucose, Bld 109 (*) 70 - 99 mg/dL   BUN 16  6 - 23 mg/dL   Creatinine, Ser 1.610.67  0.50 - 1.10 mg/dL   Calcium 8.0 (*) 8.4 - 10.5 mg/dL   Total Protein 5.7 (*) 6.0 - 8.3 g/dL   Albumin 1.6 (*) 3.5 - 5.2 g/dL   AST 096159 (*) 0 - 37 U/L   ALT 88 (*) 0 - 35 U/L   Alkaline Phosphatase 469 (*) 39 - 117 U/L   Total Bilirubin 17.3 (*) 0.3 - 1.2 mg/dL   GFR calc non Af Amer >90  >90 mL/min   GFR calc Af Amer >90  >90 mL/min  CBC WITH DIFFERENTIAL      Result Value Range   WBC 3.5 (*) 4.0 - 10.5 K/uL   RBC 2.58 (*) 3.87 - 5.11 MIL/uL   Hemoglobin 9.0 (*) 12.0 - 15.0 g/dL   HCT 04.524.9 (*) 40.936.0 - 81.146.0 %   MCV 96.5  78.0 - 100.0 fL   MCH 34.9 (*) 26.0 - 34.0 pg   MCHC 36.1 (*) 30.0 - 36.0 g/dL   RDW 91.416.6 (*) 78.211.5 - 95.615.5 %   Platelets 54 (*) 150 - 400 K/uL   Neutrophils Relative % 78 (*) 43 - 77 %   Neutro Abs 2.8  1.7 - 7.7 K/uL   Lymphocytes Relative 13  12 - 46 %   Lymphs Abs  0.5 (*) 0.7 - 4.0 K/uL   Monocytes Relative 7  3 - 12 %   Monocytes Absolute 0.3  0.1 - 1.0 K/uL   Eosinophils Relative 1  0 - 5 %   Eosinophils Absolute 0.0  0.0 - 0.7 K/uL   Basophils Relative 0  0 - 1 %   Basophils Absolute 0.0  0.0 - 0.1 K/uL  URINALYSIS, ROUTINE W REFLEX MICROSCOPIC      Result Value Range   Color, Urine AMBER (*) YELLOW   APPearance CLEAR  CLEAR   Specific Gravity, Urine 1.005  1.005 - 1.030   pH 7.0  5.0 - 8.0   Glucose, UA NEGATIVE  NEGATIVE mg/dL   Hgb urine dipstick LARGE (*) NEGATIVE   Bilirubin Urine LARGE (*) NEGATIVE   Ketones, ur 15 (*) NEGATIVE mg/dL   Protein, ur NEGATIVE  NEGATIVE mg/dL  Urobilinogen, UA 0.2  0.0 - 1.0 mg/dL   Nitrite POSITIVE (*) NEGATIVE   Leukocytes, UA SMALL (*) NEGATIVE  AMMONIA      Result Value Range   Ammonia 33  11 - 60 umol/L  URINE MICROSCOPIC-ADD ON      Result Value Range   Squamous Epithelial / LPF RARE  RARE   WBC, UA 0-2  <3 WBC/hpf   RBC / HPF 0-2  <3 RBC/hpf   Bacteria, UA MANY (*) RARE  CG4 I-STAT (LACTIC ACID)      Result Value Range   Lactic Acid, Venous 1.43  0.5 - 2.2 mmol/L  POCT I-STAT TROPONIN I      Result Value Range   Troponin i, poc 0.00  0.00 - 0.08 ng/mL   Comment 3           POCT PREGNANCY, URINE      Result Value Range   Preg Test, Ur NEGATIVE  NEGATIVE       Shelda Jakes, MD 08/09/13 1147

## 2013-08-09 NOTE — Progress Notes (Signed)
Seen and examined.  Discussed with Dr. Adriana Simasook.  Feels better this am.  Denies urgency and dysuria.  Does have frequency from diuretics.  Long discussion.  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  She has now committed to staying clean and sober.  She hopes to live long enough to get back on the transplant list.  She wants to "keep fighting" and thus does not see hospice as a good choice at this point.  I was very clear that she will likely not live long enough to receive a transplant.  Again, she is committed to clean and sober, compliance and fighting.  [place] in [place] in Feb.  Hopefully we can stablize her for DC.  I recognize that Bactrim does not cover the enterococcus, but I think that is OK given her current asymptomatic status.

## 2013-08-09 NOTE — Progress Notes (Signed)
Urine culture returned - Enterococcus 50,000 CFU. Patient asymptomatic, so will not continue treatment.  Bactrim discontinued.

## 2013-08-10 DIAGNOSIS — E7220 Disorder of urea cycle metabolism, unspecified: Secondary | ICD-10-CM

## 2013-08-10 DIAGNOSIS — G9340 Encephalopathy, unspecified: Secondary | ICD-10-CM

## 2013-08-10 LAB — CBC
HEMATOCRIT: 29.8 % — AB (ref 36.0–46.0)
HEMOGLOBIN: 10.6 g/dL — AB (ref 12.0–15.0)
MCH: 35 pg — ABNORMAL HIGH (ref 26.0–34.0)
MCHC: 35.6 g/dL (ref 30.0–36.0)
MCV: 98.3 fL (ref 78.0–100.0)
Platelets: 39 10*3/uL — ABNORMAL LOW (ref 150–400)
RBC: 3.03 MIL/uL — ABNORMAL LOW (ref 3.87–5.11)
RDW: 16.5 % — ABNORMAL HIGH (ref 11.5–15.5)
WBC: 11.5 10*3/uL — ABNORMAL HIGH (ref 4.0–10.5)

## 2013-08-10 LAB — GLUCOSE, CAPILLARY
GLUCOSE-CAPILLARY: 260 mg/dL — AB (ref 70–99)
Glucose-Capillary: 264 mg/dL — ABNORMAL HIGH (ref 70–99)

## 2013-08-10 LAB — BASIC METABOLIC PANEL
BUN: 15 mg/dL (ref 6–23)
CHLORIDE: 84 meq/L — AB (ref 96–112)
CO2: 35 mEq/L — ABNORMAL HIGH (ref 19–32)
Calcium: 8.1 mg/dL — ABNORMAL LOW (ref 8.4–10.5)
Creatinine, Ser: 0.85 mg/dL (ref 0.50–1.10)
GFR calc Af Amer: >90 mL/min (ref >90)
GFR calc non Af Amer: >90 mL/min (ref >90)
Glucose, Bld: 260 mg/dL — ABNORMAL HIGH (ref 70–99)
POTASSIUM: 4 meq/L (ref 3.7–5.3)
SODIUM: 128 meq/L — AB (ref 137–147)

## 2013-08-10 LAB — CULTURE, BLOOD (ROUTINE X 2)
Culture: NO GROWTH
Culture: NO GROWTH

## 2013-08-10 MED ORDER — TORSEMIDE 20 MG PO TABS: 40.0000 mg | ORAL_TABLET | Freq: Two times a day (BID) | ORAL | Status: DC

## 2013-08-10 MED ORDER — URSODIOL 300 MG PO CAPS: 300.0000 mg | ORAL_CAPSULE | Freq: Two times a day (BID) | ORAL | Status: AC

## 2013-08-10 MED ORDER — RIFAXIMIN 550 MG PO TABS: 550.0000 mg | ORAL_TABLET | Freq: Two times a day (BID) | ORAL | Status: AC

## 2013-08-10 MED ORDER — PREDNISONE 20 MG PO TABS: ORAL_TABLET | ORAL | Status: AC

## 2013-08-10 MED ORDER — MORPHINE SULFATE (CONCENTRATE) 10 MG /0.5 ML PO SOLN: 5.0000 mg | ORAL | Status: AC | PRN

## 2013-08-10 MED ORDER — ONDANSETRON 4 MG PO TBDP: 4.0000 mg | ORAL_TABLET | Freq: Four times a day (QID) | ORAL | Status: DC | PRN

## 2013-08-10 NOTE — Progress Notes (Signed)
Seen and examined.  Discussed with Dr. Adriana Simasook.  Agree with his documentation and management.  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  She states that she is committed to remaining clean and sober.  She hopes to get back on the transplant list.  Although her medical condition would make her hospice eligible, she desires to continue aggressive medical care.  She is now stable and OK to be DCed on current meds.  [facility].  Hope for the best and prepare for the worst.

## 2013-08-10 NOTE — Discharge Instructions (Signed)
Continue your medications as prescribed.    Follow up early next week.  [facility].

## 2013-08-10 NOTE — Progress Notes (Signed)
Patient discharged home with grandmother. Patient given discharge instructions and prescriptions. Patient was told to follow up with doctors and keep appointments. Patient was told to call MD with questions and concerns. Patient was stable upon discharge.

## 2013-08-10 NOTE — Progress Notes (Signed)
Family Medicine Teaching Service Daily Progress Note Intern Pager: [phone]  Patient name: [name] Medical record number: [id number] Date of birth: 26-Dec-1985 Age: 28 y.o. Gender: female  Primary Care Provider: Levert FeinsteinMcIntyre, Brittany, MD Consultants: None Code Status: Full  Pt Overview and Major Events to Date:  12/22: febrile, abdominal pain; CT possible port vein thrombosis, started Zosyn, CTX 12/25: CTX stopped   12/27: Acute mental status change, asterixis, Ammonia 63 12/29: Palliative discussion with pt, pt not wanting hospice services;            [facility]; [facility] given pt not transplant candidate at this time and nothing further to do at this point. Relayed this information to pt.  12/30 and 12/31: Primary team attempting to contact hepatologist.           Abdominal pain and urine with gm pos cocci -->bactrim DS BID 08/09/13 - Metabolic alkalosis noted on BMP.  Assessment and Plan: [name] is a 28 y.o. female presenting with abdominal pain, nausea, fever. PMH is significant for primary sclerosing cholangitis ([facility]), autoimmune hepatitis, seizure disorder, thrombocytopenia.  # Encephalopathy  - Now resolved.  - Continue Lactulose 30 mg QID, Rifaximin 550 mg BID. - Palliative team discussed with pt 12/29-12/30, who was initially resistant to hospice but starting to become more accepting slowly.  [facility] on-call GI refused transfer; Attempted to contact Dr Harrel Lemonarling's hepatology office, [facility] office (left 2 msgs), and pager (x2) but have not heard back. - GI consulted here.  Recs:  No additional therapies or interventions to offer other than drug abstinence and transplant  # Anasarca - due to cirrhosis + hypoalbuminemia - increased torsemide to 40mg  and spironolactone to 200 mg 12/29. - Increased torsemide to 40mg  BID and added lasix 40mg  IV x 1 12/30 with mild improvement of LE edema;  redosed 12/31 with stable renal function. - Continue TED Hose.  Continue Toresemide BID.   # Abdominal pain, vomiting, fever: likely related to pt's chronic primary sclerosing cholangitis. She was treated for a presumed UTI with keflex, which she did not tolerate by mouth. Was on zosyn which was then switched to CTX given no evidence of SBP. Cultures have all been neg so stopped CTX 12/25. CT abdomen showing questionable portal vein occlusion however review with Interventional radiology and they feel it is patent. [facility] demonstrated a "diminutive portal vein but no evidence of thrombosis". Liver doppler supported occluded portal vein, no intervention possible per IR and not a candidate for anticoag given liver function. - continue PO prednisone.   - Patient currently afebrile - Continued abd pain and nausea - Pain control: Morphine PRN.  - No treatment (per GI) for portal vein thrombosis  # Seizure disorder/shaking spells - continue home keppra - seizure precautions   # Primary sclerosing cholangitis and secondary liver cirrhosis.  MELD 16 12/31. INR decr to 2.4 from 3. - continue lactulose per above. - continue cholestyramine and Ursodiol; continue prednisone - thrombocytopenia - stable. - Continue Nadolol for known history of varices  # Metabolic alkalosis - CO2 37 - Likely secondary to diuresis and hyperaldosteronism (from cirrhosis). - CO2 trending downward.   # Drug use: cocaine is drug of choice, but pt attesting to recently deciding to stop; UDS + for cocaine and opiates  # Hypokalemia - Currently resolved.  # Hyperglycemia  - Sensitive SSI during admission. - A1C 5.1  FEN/GI: SLIV, regular diet Prophylaxis: SCD's  Disposition: Planned D/C home today.  Subjective: Feeling well other than feeling somewhat drowsy.  Tolerating PO. No chest pain, SOB.   Objective: Temp:  [98 F (36.7 C)-98.7 F (37.1 C)] 98 F (36.7 C) (01/02 0900) Pulse  Rate:  [81-99] 90 (01/02 0900) Resp:  [18] 18 (01/02 0900) BP: (105-147)/(62-74) 135/68 mmHg (01/02 0900) SpO2:  [89 %-99 %] 94 % (01/02 0900) Weight:  [195 lb 5.2 oz (88.599 kg)] 195 lb 5.2 oz (88.599 kg) (01/01 2148)  Intake/Output Summary (Last 24 hours) at 08/10/13 1014 Last data filed at 08/10/13 0549  Gross per 24 hour  Intake   2780 ml  Output   2350 ml  Net    430 ml    Physical Exam:  General: chronically ill appearing young female in NAD.  HEENT: Scleral icterus noted.  Cardiovascular: RRR with II/VI systolic murmur. Respiratory: CTAB, no wheezing Abdomen: obese, mildly tender along the right quadrant; soft, distended.  Extremities: trace LE edema this am Neuro- AOx3, no focal deficits  Laboratory:  Recent Labs Lab 08/08/13 0551 08/09/13 0432 08/10/13 0531  WBC 8.0 11.6* 11.5*  HGB 10.6* 10.6* 10.6*  HCT 30.2* 29.7* 29.8*  PLT 56* 44* 39*    Recent Labs Lab 08/04/13 0705 08/05/13 1103 08/06/13 0612  08/08/13 0551 08/09/13 0432 08/10/13 0531  NA 134* 134* 130*  < > 134* 132* 128*  K 3.4* 4.0 3.1*  < > 4.3 4.0 4.0  CL 103 104 100  < > 92* 86* 84*  CO2 21 24 21   < > 34* 37* 35*  BUN 22 23 17   < > 14 13 15   CREATININE 0.80 0.72 0.75  < > 0.79 0.50 0.85  CALCIUM 8.0* 8.0* 7.7*  < > 8.6 8.5 8.1*  PROT 5.3* 5.2* 5.4*  --   --   --   --   BILITOT 15.2* 15.0* 16.4*  --   --   --   --   ALKPHOS 340* 323* 310*  --   --   --   --   ALT 95* 102* 105*  --   --   --   --   AST 79* 87* 86*  --   --   --   --   GLUCOSE 252* 142* 210*  < > 399* 146* 260*  < > = values in this interval not displayed.  Recent Labs Lab 08/03/13 1153 08/04/13 0705 08/05/13 1103 08/06/13 0612 08/08/13 0551 08/09/13 0432  AST 89* 79* 87* 86*  --   --   ALT 102* 95* 102* 105*  --   --   ALKPHOS 362* 340* 323* 310*  --   --   BILITOT 16.6* 15.2* 15.0* 16.4*  --   --   PROT 5.6* 5.3* 5.2* 5.4*  --   --   ALBUMIN 1.5* 1.4* 1.4* 1.4*  --   --   INR  --   --  3.04*  --  2.42*  2.48*     UA large bili, sml hemoglobin Ucx: Gram + cocci Bcx negative to date  Imaging/Diagnostic Tests: From previous admission 07/27/13 IMPRESSION:  1. Advanced cirrhosis with evidence of either severe stenosis or portal vein occlusion resulting in cavernous transformation in the porta hepatis and numerous portosystemic collaterals, as above. Associated with this, there is a massive splenomegaly, a small volume of ascites and mild diffuse mesenteric edema.  2. Subtle haziness in the peripancreatic fat may be  part of this underlying state of soft tissue edema, however, clinical correlation for signs and symptoms of pancreatitis is suggested.  3. Extensive colonic wall thickening, most pronounced in the region of the ascending, transverse and descending colon. While this may simply be related to an underlying state of anasarca, clinical correlation for signs and symptoms of colitis is recommended.  4. Additional incidental findings, as above.   07/30/13 US abdomen IMPRESSION:  There is a small fluid collection in the right lower quadrant of the abdomen. No large volume of ascites is demonstrated.   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 No intrahepatic portal vein flow was detected. The hepatic veins are attenuated with only a tiny middle hepatic vein felt to be identifiable.   Tommie Sams, DO 08/10/2013, 10:14 AM PGY-2, [place] [facility] FPTS Intern pager: [phone], text pages welcome

## 2013-08-13 NOTE — Discharge Summary (Signed)
Seen and examined on the day of DC.  Agree with Dr. Cherre HugerAdamo's management.

## 2013-08-13 NOTE — Discharge Summary (Signed)
Family Medicine Teaching [facility] Discharge Summary  Patient name: [name] Medical record number: [id number] Date of birth: 1986-04-05 Age: 28 y.o. Gender: female Date of Admission: 07/30/2013  Date of Discharge: 08/10/13 Admitting Physician: Uvaldo Rising, MD  Primary Care Provider: Levert Feinstein, MD Consultants: none  Indication for Hospitalization: fever, abdominal pain  Discharge Diagnoses/Problem List:  Patient Active Problem List   Diagnosis Date Noted  . Chronic liver failure 08/08/2013  . Primary sclerosing cholangitis 07/30/2013  . Intractable nausea and vomiting 07/21/2013  . Altered mental status 07/20/2013  . Tongue ulceration 06/14/2013  . Vaginal discharge 06/14/2013  . Heart murmur, systolic 03/27/2013  . Sepsis 03/25/2013  . Cocaine abuse 03/24/2013  . Acute encephalopathy 03/24/2013  . Hyperammonemia 03/24/2013  . Other pancytopenia 12/26/2012  . Alcohol abuse 12/26/2012  . Abdominal  pain, other specified site 12/26/2012  . Toxic effect of ethanol 12/16/2012  . Abdominal pain, chronic, right upper quadrant 11/27/2012  . Anemia 10/02/2012  . Bilateral lower abdominal pain 07/23/2012  . Autoimmune hepatitis 06/22/2012  . Ascites 06/22/2012  . 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 Nausea and vomiting in adult 06/21/2011  . Hyponatremia 06/21/2011  . Edema leg 05/26/2011  . Seizure disorder 03/26/2011  . ENCEPHALOPATHY-HEPATIC 09/18/2009  . Portal hypertension 09/18/2009  . INTRACRANIAL HEMORRHAGE 12/05/2007  . OTHER SPECIFIED DISEASE OF HAIR&HAIR FOLLICLES 12/05/2007  . AMENORRHEA 06/29/2007  . THROMBOCYTOPENIA 10/06/2006  . TOBACCO DEPENDENCE 10/06/2006  . ESOPHAGEAL VARICES 10/06/2006  . HEPATIC CIRRHOSIS, NONALCOHOLIC 10/06/2006  . LIVER FAILURE 10/06/2006  . PAPANICOLAOU SMEAR, ABNORMAL 10/06/2006    Disposition: home  Discharge Condition: improved  Discharge Exam:  General: chronically ill appearing young female in NAD.   HEENT: Scleral icterus noted.  Cardiovascular: RRR with II/VI systolic murmur.  Respiratory: CTAB, no wheezing  Abdomen: obese, mildly tender along the right quadrant; soft, distended.  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 She was admitted with abdominal pain and fever. She was found to have a UTI which was treated with zosyn and later transitioned to bactrim. Her pain improved with treatment. She developed encephalopathy during her stay which improved with up-titration of her lactulose. Her anasarca and LE edema improved with diuresis. She was found to have a portal vein thrombosis vs. narrowing on CT scan. It was also imaged with doppler ultrasound which did not provide any additional information. It was discussed with interventional radiology and determined that there was no intervention advisable and she was not a candidate for anticoagulation given her synthetic dysfunction.  Issues for Follow Up: none  Significant Procedures: none  Significant Labs and Imaging:   Recent Labs Lab 08/08/13 0551 08/09/13 0432 08/10/13 0531  WBC 8.0 11.6* 11.5*  HGB 10.6* 10.6* 10.6*  HCT 30.2* 29.7* 29.8*  PLT 56* 44* 39*    Recent Labs Lab 08/07/13 0800 08/08/13 0551 08/09/13 0432 08/10/13 0531  NA 134* 134* 132* 128*  K 4.4 4.3 4.0 4.0  CL 96 92* 86* 84*  CO2 30 34* 37* 35*  GLUCOSE 355* 399* 146* 260*  BUN 13 14 13 15   CREATININE 0.82 0.79 0.50 0.85  CALCIUM 8.1* 8.6 8.5 8.1*  MG  --  1.8  --   --    UA large bili, sml hemoglobin  Ucx: Gram + cocci  Bcx negative to date    Abd CT 1. Advanced cirrhosis with evidence of either severe stenosis or portal vein occlusion resulting in cavernous transformation in the porta hepatis and numerous portosystemic collaterals, as above.  Associated with this, there is a massive splenomegaly, a small volume of ascites and mild diffuse mesenteric edema.  2. Subtle haziness in the peripancreatic fat may be part of this underlying state of soft tissue edema, however, clinical correlation for signs and symptoms of pancreatitis is suggested.  3. Extensive colonic wall thickening, most pronounced in the region of the ascending, transverse and descending colon. While this may simply be related to an underlying state of anasarca, clinical correlation for signs and symptoms of colitis is recommended.  4. Additional incidental findings, as above.   07/30/13 US abdomen  There is a small fluid collection in the right lower quadrant of the abdomen. No large volume of ascites is demonstrated.   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 No intrahepatic portal vein flow was detected. The hepatic veins are attenuated with only a tiny middle hepatic vein felt to be identifiable.  Results/Tests Pending at Time of Discharge: none  Discharge Medications:    Medication List    STOP taking these medications       cephALEXin 500 MG capsule  Commonly known as:  KEFLEX     furosemide 40 MG tablet  Commonly known as:  LASIX     oxyCODONE 5 MG immediate release tablet  Commonly known as:  ROXICODONE      TAKE these medications       acetaminophen 500 MG tablet  Commonly known as:  TYLENOL  Take 1 tablet (500 mg total) by mouth 2 (two) times daily as needed for headache.     acidophilus Caps capsule  Take 2 capsules by mouth daily.     cholestyramine 4 G packet  Commonly known as:  QUESTRAN  Take 1 packet (4 g total) by mouth every 12 (twelve) hours.     desloratadine 5 MG tablet  Commonly known as:  CLARINEX  Take 1 tablet (5 mg total) by mouth daily.      EPINEPHrine 0.3 mg/0.3 mL Devi  Commonly known as:  EPI-PEN  Inject 0.3 mg into the muscle once as needed (severe allergic reaction).     folic acid 1 MG tablet  Commonly known as:  FOLVITE  Take 1 mg by mouth daily.     hydrOXYzine 25 MG tablet  Commonly known as:  ATARAX/VISTARIL  Take 50 mg by mouth daily as needed for itching.     lactulose 10 GM/15ML solution  Commonly known as:  CHRONULAC  Take 30 mLs (20 g total) by mouth 2 (two) times daily as needed (to produce 2-3 bowel movements daily).     levETIRAcetam 750 MG tablet  Commonly known as:  KEPPRA  Take 750 mg by mouth every 12 (twelve) hours.     metoCLOPramide 10 MG tablet  Commonly known as:  REGLAN  Take 1 tablet (10 mg total) by mouth 3 (three) times daily before meals.     morphine CONCENTRATE 10 mg / 0.5 ml concentrated solution  Take 0.25 mLs (5 mg total) by mouth every 4 (four) hours as needed for severe pain.     nadolol 20 MG tablet  Commonly known as:  CORGARD  Take  20 mg by mouth at bedtime.     ondansetron 4 MG disintegrating tablet  Commonly known as:  ZOFRAN-ODT  Take 4 mg by mouth every 4 (four) hours as needed for nausea or vomiting.     ondansetron 4 MG disintegrating tablet  Commonly known as:  ZOFRAN-ODT  Take 1 tablet (4 mg total) by mouth every 6 (six) hours as needed for nausea or vomiting.     pantoprazole 40 MG tablet  Commonly known as:  PROTONIX  Take 40 mg by mouth 2 (two) times daily.     predniSONE 20 MG tablet  Commonly known as:  DELTASONE  Take 2 tablets (40 mg) for the next 4 days.  Then take 20 mg daily until follow up with PCP.     promethazine 25 MG suppository  Commonly known as:  PHENERGAN  Place 0.5 suppositories (12.5 mg total) rectally every 6 (six) hours as needed for nausea or vomiting.     promethazine 25 MG tablet  Commonly known as:  PHENERGAN  Take 0.5 tablets (12.5 mg total) by mouth every 6 (six) hours as needed for nausea.     rifaximin 550 MG Tabs  tablet  Commonly known as:  XIFAXAN  Take 1 tablet (550 mg total) by mouth 2 (two) times daily.     spironolactone 100 MG tablet  Commonly known as:  ALDACTONE  Take 2 tablets (200 mg total) by mouth daily.     torsemide 20 MG tablet  Commonly known as:  DEMADEX  Take 2 tablets (40 mg total) by mouth 2 (two) times daily.     ursodiol 300 MG capsule  Commonly known as:  ACTIGALL  Take 1 capsule (300 mg total) by mouth 2 (two) times daily.        Discharge Instructions: Please refer to Patient Instructions section of EMR for full details.  Patient was counseled important signs and symptoms that should prompt return to medical care, changes in medications, dietary instructions, activity restrictions, and follow up appointments.   Follow-Up Appointments:     Follow-up Information   Schedule an appointment as soon as possible for a visit with Levert FeinsteinMcIntyre, Brittany, MD. (Please make an appointment early next week (preferably Monday))    Specialty:  Family Medicine   Contact information:   [address] [place] [address] [phone]       Beverely LowElena Adamo, MD 08/13/2013, 7:41 AM PGY-1, [facility]

## 2013-08-16 ENCOUNTER — Inpatient Hospital Stay (HOSPITAL_COMMUNITY)
Admission: EM | Admit: 2013-08-16 | Discharge: 2013-08-22 | DRG: 441 | Disposition: A | Discharge status: Hospice | Payer: Medicare Other | Attending: Family Medicine | Admitting: Family Medicine

## 2013-08-16 ENCOUNTER — Encounter (HOSPITAL_COMMUNITY): Payer: Self-pay | Admitting: Emergency Medicine

## 2013-08-16 ENCOUNTER — Emergency Department (HOSPITAL_COMMUNITY): Payer: Medicare Other

## 2013-08-16 DIAGNOSIS — K754 Autoimmune hepatitis: Secondary | ICD-10-CM | POA: Diagnosis present

## 2013-08-16 DIAGNOSIS — R1011 Right upper quadrant pain: Secondary | ICD-10-CM

## 2013-08-16 DIAGNOSIS — D500 Iron deficiency anemia secondary to blood loss (chronic): Secondary | ICD-10-CM | POA: Diagnosis present

## 2013-08-16 DIAGNOSIS — K8309 Other cholangitis: Secondary | ICD-10-CM | POA: Diagnosis present

## 2013-08-16 DIAGNOSIS — D696 Thrombocytopenia, unspecified: Secondary | ICD-10-CM

## 2013-08-16 DIAGNOSIS — D72829 Elevated white blood cell count, unspecified: Secondary | ICD-10-CM | POA: Diagnosis present

## 2013-08-16 DIAGNOSIS — K767 Hepatorenal syndrome: Secondary | ICD-10-CM | POA: Diagnosis present

## 2013-08-16 DIAGNOSIS — K769 Liver disease, unspecified: Secondary | ICD-10-CM | POA: Diagnosis present

## 2013-08-16 DIAGNOSIS — K7682 Hepatic encephalopathy: Principal | ICD-10-CM | POA: Diagnosis present

## 2013-08-16 DIAGNOSIS — K766 Portal hypertension: Secondary | ICD-10-CM | POA: Diagnosis present

## 2013-08-16 DIAGNOSIS — E871 Hypo-osmolality and hyponatremia: Secondary | ICD-10-CM | POA: Diagnosis present

## 2013-08-16 DIAGNOSIS — R600 Localized edema: Secondary | ICD-10-CM

## 2013-08-16 DIAGNOSIS — Z515 Encounter for palliative care: Secondary | ICD-10-CM

## 2013-08-16 DIAGNOSIS — F1010 Alcohol abuse, uncomplicated: Secondary | ICD-10-CM

## 2013-08-16 DIAGNOSIS — G40909 Epilepsy, unspecified, not intractable, without status epilepticus: Secondary | ICD-10-CM | POA: Diagnosis present

## 2013-08-16 DIAGNOSIS — F1410 Cocaine abuse, uncomplicated: Secondary | ICD-10-CM | POA: Diagnosis present

## 2013-08-16 DIAGNOSIS — F1210 Cannabis abuse, uncomplicated: Secondary | ICD-10-CM | POA: Diagnosis present

## 2013-08-16 DIAGNOSIS — E878 Other disorders of electrolyte and fluid balance, not elsewhere classified: Secondary | ICD-10-CM | POA: Diagnosis present

## 2013-08-16 DIAGNOSIS — K7460 Unspecified cirrhosis of liver: Secondary | ICD-10-CM | POA: Diagnosis present

## 2013-08-16 DIAGNOSIS — R011 Cardiac murmur, unspecified: Secondary | ICD-10-CM

## 2013-08-16 DIAGNOSIS — Z79899 Other long term (current) drug therapy: Secondary | ICD-10-CM

## 2013-08-16 DIAGNOSIS — G8929 Other chronic pain: Secondary | ICD-10-CM | POA: Diagnosis present

## 2013-08-16 DIAGNOSIS — R945 Abnormal results of liver function studies: Secondary | ICD-10-CM

## 2013-08-16 DIAGNOSIS — N289 Disorder of kidney and ureter, unspecified: Secondary | ICD-10-CM | POA: Diagnosis present

## 2013-08-16 DIAGNOSIS — R7989 Other specified abnormal findings of blood chemistry: Secondary | ICD-10-CM | POA: Diagnosis present

## 2013-08-16 DIAGNOSIS — K7290 Hepatic failure, unspecified without coma: Principal | ICD-10-CM

## 2013-08-16 DIAGNOSIS — R188 Other ascites: Secondary | ICD-10-CM

## 2013-08-16 DIAGNOSIS — IMO0002 Reserved for concepts with insufficient information to code with codable children: Secondary | ICD-10-CM | POA: Diagnosis present

## 2013-08-16 DIAGNOSIS — N179 Acute kidney failure, unspecified: Secondary | ICD-10-CM | POA: Diagnosis present

## 2013-08-16 DIAGNOSIS — Z8673 Personal history of transient ischemic attack (TIA), and cerebral infarction without residual deficits: Secondary | ICD-10-CM

## 2013-08-16 DIAGNOSIS — D6959 Other secondary thrombocytopenia: Secondary | ICD-10-CM | POA: Diagnosis present

## 2013-08-16 DIAGNOSIS — D649 Anemia, unspecified: Secondary | ICD-10-CM

## 2013-08-16 DIAGNOSIS — E7220 Disorder of urea cycle metabolism, unspecified: Secondary | ICD-10-CM

## 2013-08-16 DIAGNOSIS — E876 Hypokalemia: Secondary | ICD-10-CM | POA: Diagnosis present

## 2013-08-16 DIAGNOSIS — I868 Varicose veins of other specified sites: Secondary | ICD-10-CM | POA: Diagnosis present

## 2013-08-16 DIAGNOSIS — K8301 Primary sclerosing cholangitis: Secondary | ICD-10-CM | POA: Diagnosis present

## 2013-08-16 DIAGNOSIS — Z87891 Personal history of nicotine dependence: Secondary | ICD-10-CM

## 2013-08-16 DIAGNOSIS — I959 Hypotension, unspecified: Secondary | ICD-10-CM | POA: Diagnosis present

## 2013-08-16 DIAGNOSIS — K7210 Chronic hepatic failure without coma: Secondary | ICD-10-CM

## 2013-08-16 DIAGNOSIS — G9340 Encephalopathy, unspecified: Secondary | ICD-10-CM

## 2013-08-16 DIAGNOSIS — R4182 Altered mental status, unspecified: Secondary | ICD-10-CM

## 2013-08-16 DIAGNOSIS — R109 Unspecified abdominal pain: Secondary | ICD-10-CM

## 2013-08-16 LAB — COMPREHENSIVE METABOLIC PANEL
ALBUMIN: 1.7 g/dL — AB (ref 3.5–5.2)
ALT: 139 U/L — AB (ref 0–35)
AST: 210 U/L — AB (ref 0–37)
Alkaline Phosphatase: 241 U/L — ABNORMAL HIGH (ref 39–117)
BUN: 33 mg/dL — ABNORMAL HIGH (ref 6–23)
CALCIUM: 9 mg/dL (ref 8.4–10.5)
CO2: 32 mEq/L (ref 19–32)
CREATININE: 1.3 mg/dL — AB (ref 0.50–1.10)
Chloride: 83 mEq/L — ABNORMAL LOW (ref 96–112)
GFR calc Af Amer: 65 mL/min — ABNORMAL LOW (ref >90)
GFR calc non Af Amer: 56 mL/min — ABNORMAL LOW (ref >90)
Glucose, Bld: 118 mg/dL — ABNORMAL HIGH (ref 70–99)
Potassium: 3.5 mEq/L — ABNORMAL LOW (ref 3.7–5.3)
SODIUM: 125 meq/L — AB (ref 137–147)
TOTAL PROTEIN: 5.2 g/dL — AB (ref 6.0–8.3)
Total Bilirubin: 28.9 mg/dL (ref 0.3–1.2)

## 2013-08-16 LAB — CBC WITH DIFFERENTIAL/PLATELET
BASOS ABS: 0 10*3/uL (ref 0.0–0.1)
BASOS PCT: 0 % (ref 0–1)
EOS PCT: 1 % (ref 0–5)
Eosinophils Absolute: 0.1 10*3/uL (ref 0.0–0.7)
HEMATOCRIT: 29.2 % — AB (ref 36.0–46.0)
Hemoglobin: 10.8 g/dL — ABNORMAL LOW (ref 12.0–15.0)
LYMPHS PCT: 6 % — AB (ref 12–46)
Lymphs Abs: 0.7 10*3/uL (ref 0.7–4.0)
MCH: 35.4 pg — ABNORMAL HIGH (ref 26.0–34.0)
MCHC: 37 g/dL — AB (ref 30.0–36.0)
MCV: 95.7 fL (ref 78.0–100.0)
MONO ABS: 1.4 10*3/uL — AB (ref 0.1–1.0)
Monocytes Relative: 13 % — ABNORMAL HIGH (ref 3–12)
Neutro Abs: 8.8 10*3/uL — ABNORMAL HIGH (ref 1.7–7.7)
Neutrophils Relative %: 80 % — ABNORMAL HIGH (ref 43–77)
PLATELETS: 68 10*3/uL — AB (ref 150–400)
RBC: 3.05 MIL/uL — ABNORMAL LOW (ref 3.87–5.11)
RDW: 16 % — AB (ref 11.5–15.5)
WBC: 11 10*3/uL — AB (ref 4.0–10.5)

## 2013-08-16 LAB — AMMONIA: AMMONIA: 65 umol/L — AB (ref 11–60)

## 2013-08-16 LAB — LIPASE, BLOOD: Lipase: 18 U/L (ref 11–59)

## 2013-08-16 MED ORDER — SODIUM CHLORIDE 0.9 % IJ SOLN: 3.0000 mL | INTRAMUSCULAR | Status: DC | PRN

## 2013-08-16 MED ORDER — ONDANSETRON 4 MG PO TBDP
4.0000 mg | ORAL_TABLET | Freq: Four times a day (QID) | ORAL | Status: DC | PRN
  Administered 2013-08-18 – 2013-08-19 (×4): 4 mg via ORAL
  Filled 2013-08-16 (×5): qty 1

## 2013-08-16 MED ORDER — SODIUM CHLORIDE 0.9 % IV SOLN
INTRAVENOUS | Status: AC
  Administered 2013-08-16: 21:00:00 via INTRAVENOUS

## 2013-08-16 MED ORDER — FOLIC ACID 1 MG PO TABS
1.0000 mg | ORAL_TABLET | Freq: Every day | ORAL | Status: DC
  Administered 2013-08-16 – 2013-08-22 (×7): 1 mg via ORAL
  Filled 2013-08-16 (×7): qty 1

## 2013-08-16 MED ORDER — RIFAXIMIN 550 MG PO TABS
550.0000 mg | ORAL_TABLET | Freq: Two times a day (BID) | ORAL | Status: DC
  Administered 2013-08-17 – 2013-08-22 (×12): 550 mg via ORAL
  Filled 2013-08-16 (×13): qty 1

## 2013-08-16 MED ORDER — TORSEMIDE 20 MG PO TABS
40.0000 mg | ORAL_TABLET | Freq: Two times a day (BID) | ORAL | Status: DC
  Administered 2013-08-16: 40 mg via ORAL
  Filled 2013-08-16 (×3): qty 2

## 2013-08-16 MED ORDER — SPIRONOLACTONE 100 MG PO TABS
200.0000 mg | ORAL_TABLET | Freq: Every day | ORAL | Status: DC
  Administered 2013-08-16: 200 mg via ORAL
  Filled 2013-08-16 (×2): qty 2

## 2013-08-16 MED ORDER — HALOPERIDOL LACTATE 5 MG/ML IJ SOLN
5.0000 mg | Freq: Once | INTRAMUSCULAR | Status: AC
  Administered 2013-08-16: 5 mg via INTRAMUSCULAR
  Filled 2013-08-16: qty 1

## 2013-08-16 MED ORDER — HYDROXYZINE HCL 25 MG PO TABS: 50.0000 mg | ORAL_TABLET | Freq: Every day | ORAL | Status: DC | PRN

## 2013-08-16 MED ORDER — LEVETIRACETAM 750 MG PO TABS
750.0000 mg | ORAL_TABLET | Freq: Two times a day (BID) | ORAL | Status: DC
  Administered 2013-08-16 – 2013-08-22 (×12): 750 mg via ORAL
  Filled 2013-08-16 (×13): qty 1

## 2013-08-16 MED ORDER — PANTOPRAZOLE SODIUM 40 MG PO TBEC
40.0000 mg | DELAYED_RELEASE_TABLET | Freq: Two times a day (BID) | ORAL | Status: DC
  Administered 2013-08-17 – 2013-08-22 (×12): 40 mg via ORAL
  Filled 2013-08-16 (×10): qty 1

## 2013-08-16 MED ORDER — SODIUM CHLORIDE 0.9 % IV BOLUS (SEPSIS)
1000.0000 mL | INTRAVENOUS | Status: AC
  Administered 2013-08-16: 1000 mL via INTRAVENOUS

## 2013-08-16 MED ORDER — PROMETHAZINE HCL 25 MG PO TABS
12.5000 mg | ORAL_TABLET | Freq: Four times a day (QID) | ORAL | Status: DC | PRN
  Administered 2013-08-18: 12.5 mg via ORAL
  Filled 2013-08-16: qty 1

## 2013-08-16 MED ORDER — SODIUM CHLORIDE 0.9 % IV SOLN: 250.0000 mL | INTRAVENOUS | Status: DC | PRN

## 2013-08-16 MED ORDER — LACTULOSE 10 GM/15ML PO SOLN
20.0000 g | Freq: Three times a day (TID) | ORAL | Status: DC
  Filled 2013-08-16: qty 30

## 2013-08-16 MED ORDER — CHOLESTYRAMINE 4 G PO PACK
4.0000 g | PACK | Freq: Two times a day (BID) | ORAL | Status: DC
  Administered 2013-08-16 – 2013-08-19 (×6): 4 g via ORAL
  Filled 2013-08-16 (×7): qty 1

## 2013-08-16 MED ORDER — SODIUM CHLORIDE 0.9 % IJ SOLN
3.0000 mL | Freq: Two times a day (BID) | INTRAMUSCULAR | Status: DC
  Administered 2013-08-17 – 2013-08-21 (×6): 3 mL via INTRAVENOUS

## 2013-08-16 MED ORDER — PREDNISONE 20 MG PO TABS
40.0000 mg | ORAL_TABLET | Freq: Every day | ORAL | Status: DC
  Administered 2013-08-17 – 2013-08-20 (×4): 40 mg via ORAL
  Filled 2013-08-16 (×5): qty 2

## 2013-08-16 MED ORDER — MORPHINE SULFATE (CONCENTRATE) 10 MG /0.5 ML PO SOLN: 5.0000 mg | ORAL | Status: DC | PRN

## 2013-08-16 MED ORDER — URSODIOL 300 MG PO CAPS
300.0000 mg | ORAL_CAPSULE | Freq: Two times a day (BID) | ORAL | Status: DC
  Administered 2013-08-16 – 2013-08-22 (×12): 300 mg via ORAL
  Filled 2013-08-16 (×13): qty 1

## 2013-08-16 MED ORDER — HALOPERIDOL LACTATE 5 MG/ML IJ SOLN
5.0000 mg | Freq: Four times a day (QID) | INTRAMUSCULAR | Status: DC | PRN
  Administered 2013-08-19: 5 mg via INTRAVENOUS
  Filled 2013-08-16 (×2): qty 1

## 2013-08-16 MED ORDER — LACTULOSE 10 GM/15ML PO SOLN
20.0000 g | Freq: Two times a day (BID) | ORAL | Status: DC | PRN
  Administered 2013-08-17: 20 g via ORAL
  Filled 2013-08-16: qty 30

## 2013-08-16 MED ORDER — SODIUM CHLORIDE 0.9 % IJ SOLN
3.0000 mL | Freq: Two times a day (BID) | INTRAMUSCULAR | Status: DC
  Administered 2013-08-17 – 2013-08-21 (×8): 3 mL via INTRAVENOUS

## 2013-08-16 MED ORDER — RISAQUAD PO CAPS
2.0000 | ORAL_CAPSULE | Freq: Every day | ORAL | Status: DC
  Administered 2013-08-16 – 2013-08-19 (×4): 2 via ORAL
  Filled 2013-08-16 (×4): qty 2

## 2013-08-16 NOTE — ED Notes (Signed)
Pt able to move. Pt unwilling to cooperate and answer questions. Pt keeps laying down and covering head to go to sleep.

## 2013-08-16 NOTE — ED Notes (Signed)
Pt unable to urinate at this time. Pt will not cooperate for urinary catherization. MD informed.

## 2013-08-16 NOTE — ED Notes (Signed)
CRITICAL VALUE ALERT  Critical value received:   Total bilirubin 28.9  Date of notification: 08/16/2013  Time of notification:  1840   Critical value read back:yes  Nurse who received alert:  Heide GuileHope Jakiah Goree  MD notified (1st page):  Dr. Romeo AppleHarrison  Time of first page:  1842  MD notified (2nd page):  Time of second page:  Responding MD:    Time MD responded:

## 2013-08-16 NOTE — ED Notes (Signed)
Attempt pt's In and out Cath. Pt did not tolerate. Was screaming "stop" and grabbing RN's hands. EDP made aware.

## 2013-08-16 NOTE — ED Notes (Signed)
Pt recently admitted in Dec for liver failure. Pt's mother called EMS today since pt was not wanting to get out of bed and not talking much today. Per EMS, pt's mother said she did this before her last admission. 120/70, HR 78

## 2013-08-16 NOTE — ED Provider Notes (Signed)
CSN: [id number]     Arrival date & time 08/16/13  1649 History   First MD Initiated Contact with Patient 08/16/13 1652     Chief Complaint  Patient presents with  . Altered Mental Status   (Consider location/radiation/quality/duration/timing/severity/associated sxs/prior Treatment) Patient is a 28 y.o. female presenting with altered mental status. History provided by: grandmother.  Altered Mental Status Presenting symptoms: confusion, disorientation and lethargy   Severity:  Mild Most recent episode:  Today Episode history:  Continuous Duration:  12 hours Timing:  Constant Progression:  Unchanged Chronicity:  Recurrent Context comment:  Likely related to liver pathology Associated symptoms: weakness   Associated symptoms: no abdominal pain, no fever, no headaches, no nausea and no vomiting     Past Medical History  Diagnosis Date  . Substance abuse April 2013    Cocaine plus THC usage - delisted from liver transplant list.   . Esophageal varices     distal esophageal grade 1 varices, portal gastropathy on EGDs in 01/2010 and 07/2012:  no banding or other intervention undertaken on EGDs from 2009 - 07/2012.    [name] Cirrhosis of liver   . Autoimmune hepatitis     Confirmed via liver biopsy 2002  . PSC (primary sclerosing cholangitis)   . Seizures     onset in 2009 associated with vent requiring resp failure.   . Thrombocytopenia     associated with cirrhosis.  followed by Dr Alcide Evener  . Intracranial hemorrhage 2009    in association with severe thrombocytopenia  . Heart murmur   . Anemia   . History of blood transfusion     "related to my liver" (07/30/2013)  . Headache(784.0)     "recently real bad headaches; take hydrocodone for them" (07/30/2013)  . Stroke 2009    "lost my peripheral vision" (07/30/2013)  . Anxiety   . Depression   . Primary sclerosing cholangitis     [name] 07/30/2013   Past Surgical History  Procedure Laterality Date  . Esophagogastroduodenoscopy   07/18/2012    Procedure: ESOPHAGOGASTRODUODENOSCOPY (EGD);  Surgeon: Louis Meckel, MD;  Location: [facility];  Service: Endoscopy;  Laterality: N/A;  . Gastric varices banding  07/18/2012    Procedure: GASTRIC VARICES BANDING;  Surgeon: Louis Meckel, MD;  Location: [facility];  Service: Endoscopy;  Laterality: N/A;  . Ercp  01/2010    Sclerosing Cholangitis with no dominant stricures   . Liver biopsy  2002   Family History  Problem Relation Age of Onset  . Cancer Neg Hx    History  Substance Use Topics  . Smoking status: Former Smoker -- 3 years    Types: Cigarettes    Quit date: 08/09/2005  . Smokeless tobacco: Never Used  . Alcohol Use: No   OB History   Grav Para Term Preterm Abortions TAB SAB Ect Mult Living                 Review of Systems  Constitutional: Negative for fever and fatigue.  HENT: Negative for congestion and drooling.   Eyes: Negative for pain.  Respiratory: Negative for cough and shortness of breath.   Cardiovascular: Negative for chest pain.  Gastrointestinal: Negative for nausea, vomiting, abdominal pain and diarrhea.       Jaundice  Genitourinary: Negative for dysuria and hematuria.  Musculoskeletal: Negative for back pain, gait problem and neck pain.  Skin: Negative for color change.  Neurological: Positive for weakness. Negative for dizziness and headaches.  Hematological: Negative for  adenopathy.  Psychiatric/Behavioral: Positive for confusion. Negative for behavioral problems.  All other systems reviewed and are negative.    Allergies  Ibuprofen  Home Medications   Current Outpatient Rx  Name  Route  Sig  Dispense  Refill  . acetaminophen (TYLENOL) 500 MG tablet   Oral   Take 1 tablet (500 mg total) by mouth 2 (two) times daily as needed for headache.   30 tablet   0   . acidophilus (RISAQUAD) CAPS capsule   Oral   Take 2 capsules by mouth daily.         . cholestyramine (QUESTRAN) 4 G packet   Oral   Take 1 packet  (4 g total) by mouth every 12 (twelve) hours.   20 each   0   . desloratadine (CLARINEX) 5 MG tablet   Oral   Take 1 tablet (5 mg total) by mouth daily.   30 tablet   1   . EPINEPHrine (EPI-PEN) 0.3 mg/0.3 mL DEVI   Intramuscular   Inject 0.3 mg into the muscle once as needed (severe allergic reaction).          . folic acid (FOLVITE) 1 MG tablet   Oral   Take 1 mg by mouth daily.         . hydrOXYzine (ATARAX/VISTARIL) 25 MG tablet   Oral   Take 50 mg by mouth daily as needed for itching.         . lactulose (CHRONULAC) 10 GM/15ML solution   Oral   Take 30 mLs (20 g total) by mouth 2 (two) times daily as needed (to produce 2-3 bowel movements daily).   240 mL   0   . levETIRAcetam (KEPPRA) 750 MG tablet   Oral   Take 750 mg by mouth every 12 (twelve) hours.         . metoCLOPramide (REGLAN) 10 MG tablet   Oral   Take 1 tablet (10 mg total) by mouth 3 (three) times daily before meals.   90 tablet   1   . Morphine Sulfate (MORPHINE CONCENTRATE) 10 mg / 0.5 ml concentrated solution   Oral   Take 0.25 mLs (5 mg total) by mouth every 4 (four) hours as needed for severe pain.   120 mL   0   . nadolol (CORGARD) 20 MG tablet   Oral   Take 20 mg by mouth at bedtime.          . ondansetron (ZOFRAN-ODT) 4 MG disintegrating tablet   Oral   Take 4 mg by mouth every 4 (four) hours as needed for nausea or vomiting.         . ondansetron (ZOFRAN-ODT) 4 MG disintegrating tablet   Oral   Take 1 tablet (4 mg total) by mouth every 6 (six) hours as needed for nausea or vomiting.   20 tablet   0   . pantoprazole (PROTONIX) 40 MG tablet   Oral   Take 40 mg by mouth 2 (two) times daily.         . predniSONE (DELTASONE) 20 MG tablet      Take 2 tablets (40 mg) for the next 4 days.  Then take 20 mg daily until follow up with PCP.   20 tablet   0   . promethazine (PHENERGAN) 25 MG suppository   Rectal   Place 0.5 suppositories (12.5 mg total) rectally every 6  (six) hours as needed for nausea or vomiting.   12  each   0   . promethazine (PHENERGAN) 25 MG tablet   Oral   Take 0.5 tablets (12.5 mg total) by mouth every 6 (six) hours as needed for nausea.   30 tablet   0   . rifaximin (XIFAXAN) 550 MG TABS tablet   Oral   Take 1 tablet (550 mg total) by mouth 2 (two) times daily.   60 tablet   0   . spironolactone (ALDACTONE) 100 MG tablet   Oral   Take 2 tablets (200 mg total) by mouth daily.   30 tablet   1   . torsemide (DEMADEX) 20 MG tablet   Oral   Take 2 tablets (40 mg total) by mouth 2 (two) times daily.   60 tablet   0   . ursodiol (ACTIGALL) 300 MG capsule   Oral   Take 1 capsule (300 mg total) by mouth 2 (two) times daily.   60 capsule   0    BP 97/59  Pulse 68  Resp 12  SpO2 94% Physical Exam  Nursing note and vitals reviewed. Constitutional: She appears well-developed and well-nourished.  HENT:  Head: Normocephalic.  Mouth/Throat: Oropharynx is clear and moist. No oropharyngeal exudate.  Eyes: Conjunctivae and EOM are normal. Pupils are equal, round, and reactive to light.  Scleral icterus.  Neck: Normal range of motion. Neck supple.  Cardiovascular: Normal rate, regular rhythm, normal heart sounds and intact distal pulses.  Exam reveals no gallop and no friction rub.   No murmur heard. Pulmonary/Chest: Effort normal and breath sounds normal. No respiratory distress. She has no wheezes.  Abdominal: Soft. Bowel sounds are normal. There is no tenderness. There is no rebound and no guarding.  Musculoskeletal: Normal range of motion. She exhibits no edema and no tenderness.  Neurological: She is alert. GCS eye subscore is 3. GCS verbal subscore is 4. GCS motor subscore is 6.  A/o x 1. Will only follow some commands. Arousable w/ repeat stimulation. Able to sit up in bed on command. Appears to be moving all extremities.   Skin: Skin is warm and dry.  The patient appears jaundiced diffusely.  Psychiatric: She has  a normal mood and affect. Her behavior is normal.    ED Course  Procedures (including critical care time) Labs Review Labs Reviewed  CBC WITH DIFFERENTIAL - Abnormal; Notable for the following:    WBC 11.0 (*)    RBC 3.05 (*)    Hemoglobin 10.8 (*)    HCT 29.2 (*)    MCH 35.4 (*)    MCHC 37.0 (*)    RDW 16.0 (*)    Platelets 68 (*)    Neutrophils Relative % 80 (*)    Neutro Abs 8.8 (*)    Lymphocytes Relative 6 (*)    Monocytes Relative 13 (*)    Monocytes Absolute 1.4 (*)    All other components within normal limits  COMPREHENSIVE METABOLIC PANEL - Abnormal; Notable for the following:    Sodium 125 (*)    Potassium 3.5 (*)    Chloride 83 (*)    Glucose, Bld 118 (*)    BUN 33 (*)    Creatinine, Ser 1.30 (*)    Total Protein 5.2 (*)    Albumin 1.7 (*)    AST 210 (*)    ALT 139 (*)    Alkaline Phosphatase 241 (*)    Total Bilirubin 28.9 (*)    GFR calc non Af Amer 56 (*)  GFR calc Af Amer 65 (*)    All other components within normal limits  URINALYSIS W MICROSCOPIC + REFLEX CULTURE - Abnormal; Notable for the following:    Color, Urine ORANGE (*)    APPearance CLOUDY (*)    Bilirubin Urine LARGE (*)    Ketones, ur 15 (*)    Leukocytes, UA TRACE (*)    Bacteria, UA FEW (*)    Squamous Epithelial / LPF FEW (*)    All other components within normal limits  AMMONIA - Abnormal; Notable for the following:    Ammonia 65 (*)    All other components within normal limits  URINE RAPID DRUG SCREEN (HOSP PERFORMED) - Abnormal; Notable for the following:    Opiates POSITIVE (*)    Cocaine POSITIVE (*)    All other components within normal limits  COMPREHENSIVE METABOLIC PANEL - Abnormal; Notable for the following:    Sodium 126 (*)    Potassium 3.2 (*)    Chloride 87 (*)    Glucose, Bld 120 (*)    BUN 29 (*)    Creatinine, Ser 1.14 (*)    Calcium 8.3 (*)    Total Protein 4.7 (*)    Albumin 1.5 (*)    AST 208 (*)    ALT 136 (*)    Alkaline Phosphatase 229 (*)     Total Bilirubin 26.8 (*)    GFR calc non Af Amer 66 (*)    GFR calc Af Amer 76 (*)    All other components within normal limits  CBC - Abnormal; Notable for the following:    RBC 2.71 (*)    Hemoglobin 9.6 (*)    HCT 26.0 (*)    MCH 35.4 (*)    MCHC 36.9 (*)    RDW 16.0 (*)    Platelets 52 (*)    All other components within normal limits  PROTIME-INR - Abnormal; Notable for the following:    Prothrombin Time 34.9 (*)    INR 3.65 (*)    All other components within normal limits  MRSA PCR SCREENING  LIPASE, BLOOD  SODIUM, URINE, RANDOM  CREATININE, URINE, RANDOM  MAGNESIUM  ETHANOL  TROPONIN I  BASIC METABOLIC PANEL  ACETAMINOPHEN LEVEL  SALICYLATE LEVEL   Imaging Review Dg Chest Port 1 View  08/16/2013   CLINICAL DATA:  History of liver failure. Slight cough. No chest pain.  EXAM: PORTABLE CHEST - 1 VIEW  COMPARISON:  DG CHEST 2 VIEW dated 07/07/2013  FINDINGS: The heart size and mediastinal contours are within normal limits. Both lungs are clear. The visualized skeletal structures are unremarkable.  IMPRESSION: No active disease.   Electronically Signed   By: Elige Ko   On: 08/16/2013 17:46    EKG Interpretation    Date/Time:  Thursday August 16 2013 17:13:53 EST Ventricular Rate:  67 PR Interval:  126 QRS Duration: 121 QT Interval:  461 QTC Calculation: 487 R Axis:   70 Text Interpretation:  Sinus rhythm Left bundle branch block Confirmed by Adalin Vanderploeg  MD, Obryan Radu ([id number]) on 08/16/2013 5:36:55 PM           CRITICAL CARE Performed by: Purvis Sheffield, S Total critical care time: 30 min Critical care time was exclusive of separately billable procedures and treating other patients. Critical care was necessary to treat or prevent imminent or life-threatening deterioration. Critical care was time spent personally by me on the following activities: development of treatment plan with patient and/or surrogate as well as  nursing, discussions with consultants,  evaluation of patient's response to treatment, examination of patient, obtaining history from patient or surrogate, ordering and performing treatments and interventions, ordering and review of laboratory studies, ordering and review of radiographic studies, pulse oximetry and re-evaluation of patient's condition.   MDM   1. Hepatic encephalopathy   2. Elevated LFTs   3. Hyperbilirubinemia   4. Hyponatremia   5. Renal insufficiency   6. Abdominal  pain, other specified site   7. Abdominal pain, chronic, right upper quadrant   8. Acute encephalopathy   9. Alcohol abuse   10. Altered mental status   11. Anemia   12. Ascites   13. Autoimmune hepatitis   14. Chronic liver failure   15. Cocaine abuse   16. Edema leg   17. Heart murmur, systolic   18. Other sequelae of chronic liver disease   19. Primary sclerosing cholangitis   20. Seizure disorder   21. Thrombocytopenia, unspecified    5:09 PM 28 y.o. female w hx of autoimmune hepatitis, cirrhosis of the liver, primary sclerosing cholangitis, and polysubstance abuse w/ recent d/c home who pw AMS and generalized weakness. The patient appears encephalopathic on my exam here. She is able to tell me her name but will not follow most commands. I spoke with her grandmother on the phone and she states that the pt was discharged from the hospital last Friday. The grandmother states that the patient has been taking her meds as she was instructed. She notes that the patient had decreased oral intake last night and had some coughing this morning. She was supposed to have a followup visit with family practice today but was sleeping this morning. When the grandmother went to arouse her this afternoon she appeared grossly confused. The patient was sent here for evaluation.  Record review shows that the pt is likely palliative as she is not a candidate for liver txplt. Consulted family practice who will admit.   CC documented in this pt w/ hepatic  encephalopathy (GCS 13), hypontremia, liver failure, and renal insufficiency requiring admission, discussions w/ family, extensive chart review, consultation w/ family practice, and close monitoring d/t altered mental status.      Junius ArgyleForrest S Marshall Kampf, MD 08/17/13 [phone]

## 2013-08-16 NOTE — ED Notes (Signed)
Pt pulled her IV out

## 2013-08-16 NOTE — H&P (Signed)
Family Medicine Teaching [facility] Admission History and Physical Service Pager: [phone]  Patient name: [name] Medical record number: [id number] Date of birth: September 29, 1985 Age: 28 y.o. Gender: female  Primary Care Provider: Levert Feinstein, MD Consultants: None Code Status: Full   Chief Complaint: Lethargy, confusion  Assessment and Plan: [name] is a 27 y.o. female presenting with confusion, lethargy consistent with hepatic encephalopathy. PMH is significant for autoimmune hepatitis, primary sclerosing cholangitis, substance abuse (cocaine, marijuana), cirrhosis and esophageal and gastric varices.   Acute hepatic encephalopathy: Ammonia 65, bilirubin 28.9 - See management below - Lactulose - Haldol 5mg  IV prn agitation - UDS; Patient with history of substance abuse  Liver failure due to autoimmune hepatitis, PSC, and substance abuse. 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 MELD 32 (52.6% 28-month mortality). Very poor prognosis. LFTs elevated. Albumin 1.7, INR to be drawn in AM (previously 2.48).  - Continue home meds:   Rifaximin  Ursodiol  Cholestyramine  Atarax prn - Holding diuretics for hypotension  Torsemide 40mg  BID  Spironolactone 200mg  daily - Discriminant function 88.7 (based on PT from 1/1): Will benefit from Prednisone 40mg  x 1 month.   Thrombocytopenia: Plt 68, INR 2.48. Will hold pharmacologic VTE prophylaxis and continue to monitor.    Creatinine elevation: AKI due to encephalopathy and poor hydration (supported by BUN:Cr ~ 20:1) and/or hepatorenal syndrome, given long-standing cirrhosis and sequelae.  - Will give IVF, though this may be third-spaced. May require albumin if no improvement in creatinine.  Gentle IVF (50 mL/hr) overnight. Will calculate FENa to further delineate.   Hypotension: Will continue IVF, responsive to 1L bolus in ED. Holding diuretics.   Hyperbilirubinemia: Total bilirubin: 28.9  Metabolic Derangements:   Hyponatremia:  Related to liver pathology. S/p 1L bolus in ED, will continue IVF with NS and recheck in AM.   Hypokalemia: Check mag with AM lab draw and replete as needed.   Hypochloremia: NS; recheck in AM.  Abnormal ECG: ECG in ED NSR with IVCD read as LBBB, without ST segment changes, with t-wave concordance. Doubt this is significant in her presentation.  - Repeat ECG in AM - Telemetry  Leukocytosis: Afebrile without infection source. WBC 11.0 is near baseline. Will monitor.   Anemia: Chronic, stable. Normocytic, hypochromic, due to chronic disease.   Seizure disorder: No convulsive activity currently. Continue home keppra 750mg  BID.   FEN/GI: NS @ 50 mL/hr, NPO given mental status Prophylaxis: SCDs, hold pharmacologic tx given INR 2.48, plt 68  Disposition: Admit to FMTS, attending Dr. Gwendolyn Grant on telemetry floor  History of Present Illness: [name] is a 28 y.o. female presenting with lethargy and altered mental status.   She lives with her grandmother who reports increasing lethargy since yesterday evening. She had reportedly been taking all her medications. Earlier this morning she was alert but drowsy, and when her grandmother returned home the patient was unresponsive. Pt was disoriented and intermittently agitated in the ED.   Level V caveat applies as pt is nonverbal currently and all history is from grandmother and EMR.    Review Of Systems: Per HPI with the following additions: None Otherwise 12 point review of systems was performed and was unremarkable.  Patient Active Problem List   Diagnosis Date Noted  . Elevated LFTs 08/16/2013  . Hyperbilirubinemia 08/16/2013  . Renal insufficiency 08/16/2013  . Chronic liver failure 08/08/2013  . Primary sclerosing cholangitis 07/30/2013  . Intractable nausea and vomiting 07/21/2013  . Altered mental status 07/20/2013  . Tongue ulceration 06/14/2013  .  Vaginal discharge 06/14/2013  . Heart murmur, systolic 03/27/2013  . Sepsis  03/25/2013  . Cocaine abuse 03/24/2013  . Acute encephalopathy 03/24/2013  . Hyperammonemia 03/24/2013  . Other pancytopenia 12/26/2012  . Alcohol abuse 12/26/2012  . Abdominal  pain, other specified site 12/26/2012  . Toxic effect of ethanol 12/16/2012  . Abdominal pain, chronic, right upper quadrant 11/27/2012  . Anemia 10/02/2012  . Bilateral lower abdominal pain 07/23/2012  . Autoimmune hepatitis 06/22/2012  . Ascites 06/22/2012  . 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 Nausea and vomiting in adult 06/21/2011  . Hyponatremia 06/21/2011  . Edema leg 05/26/2011  . Seizure disorder 03/26/2011  . ENCEPHALOPATHY-HEPATIC 09/18/2009  . Portal hypertension 09/18/2009  . INTRACRANIAL HEMORRHAGE 12/05/2007  . OTHER SPECIFIED DISEASE OF HAIR&HAIR FOLLICLES 12/05/2007  . AMENORRHEA 06/29/2007  . THROMBOCYTOPENIA 10/06/2006  . TOBACCO DEPENDENCE 10/06/2006  . ESOPHAGEAL VARICES 10/06/2006  . HEPATIC CIRRHOSIS, NONALCOHOLIC 10/06/2006  . LIVER FAILURE 10/06/2006  . PAPANICOLAOU SMEAR, ABNORMAL 10/06/2006   Past Medical History: Past Medical History  Diagnosis Date  . Substance abuse April 2013    Cocaine plus THC usage - delisted from liver transplant list.   . Esophageal varices     distal esophageal grade 1 varices, portal gastropathy on EGDs in 01/2010 and 07/2012:  no banding or other intervention undertaken on EGDs from 2009 - 07/2012.    [name]. Cirrhosis of liver   . Autoimmune hepatitis     Confirmed via liver biopsy 2002  . PSC (primary sclerosing cholangitis)   . Seizures     onset in 2009 associated with vent requiring resp failure.   . Thrombocytopenia     associated with cirrhosis.  followed by Dr Alcide EvenerSherril  . Intracranial hemorrhage 2009    in association with severe thrombocytopenia  . Heart murmur   . Anemia   . History of blood transfusion     "related to my liver" (07/30/2013)  . Headache(784.0)     "recently real bad headaches; take hydrocodone for them" (07/30/2013)   . Stroke 2009    "lost my peripheral vision" (07/30/2013)  . Anxiety   . Depression   . Primary sclerosing cholangitis     [name]/notes 07/30/2013   Past Surgical History: Past Surgical History  Procedure Laterality Date  . Esophagogastroduodenoscopy  07/18/2012    Procedure: ESOPHAGOGASTRODUODENOSCOPY (EGD);  Surgeon: Louis Meckelobert D Kaplan, MD;  [facility];  Service: Endoscopy;  Laterality: N/A;  . Gastric varices banding  07/18/2012    Procedure: GASTRIC VARICES BANDING;  Surgeon: Louis Meckelobert D Kaplan, MD;  Location: [facility];  Service: Endoscopy;  Laterality: N/A;  . Ercp  01/2010    Sclerosing Cholangitis with no dominant stricures   . Liver biopsy  2002   Social History: History  Substance Use Topics  . Smoking status: Former Smoker -- 3 years    Types: Cigarettes    Quit date: 08/09/2005  . Smokeless tobacco: Never Used  . Alcohol Use: No   Additional social history: Lives with grandmother.  Please also refer to relevant sections of EMR.  Family History: Family History  Problem Relation Age of Onset  . Cancer Neg Hx    Allergies and Medications: Allergies  Allergen Reactions  . Ibuprofen Shortness Of Breath   No current facility-administered medications on file prior to encounter.   Current Outpatient Prescriptions on File Prior to Encounter  Medication Sig Dispense Refill  . acidophilus (RISAQUAD) CAPS capsule Take 2 capsules by mouth daily.      .[name]  cholestyramine (QUESTRAN) 4 G packet Take 1 packet (4 g total) by mouth every 12 (twelve) hours.  20 each  0  . desloratadine (CLARINEX) 5 MG tablet Take 1 tablet (5 mg total) by mouth daily.  30 tablet  1  . EPINEPHrine (EPI-PEN) 0.3 mg/0.3 mL DEVI Inject 0.3 mg into the muscle once as needed (severe allergic reaction).       . folic acid (FOLVITE) 1 MG tablet Take 1 mg by mouth daily.      . hydrOXYzine (ATARAX/VISTARIL) 25 MG tablet Take 50 mg by mouth daily as needed for itching.      . lactulose (CHRONULAC) 10  GM/15ML solution Take 30 mLs (20 g total) by mouth 2 (two) times daily as needed (to produce 2-3 bowel movements daily).  240 mL  0  . levETIRAcetam (KEPPRA) 750 MG tablet Take 750 mg by mouth every 12 (twelve) hours.      . metoCLOPramide (REGLAN) 10 MG tablet Take 1 tablet (10 mg total) by mouth 3 (three) times daily before meals.  90 tablet  1  . Morphine Sulfate (MORPHINE CONCENTRATE) 10 mg / 0.5 ml concentrated solution Take 0.25 mLs (5 mg total) by mouth every 4 (four) hours as needed for severe pain.  120 mL  0  . nadolol (CORGARD) 20 MG tablet Take 20 mg by mouth at bedtime.       . ondansetron (ZOFRAN-ODT) 4 MG disintegrating tablet Take 4 mg by mouth every 4 (four) hours as needed for nausea or vomiting.      . ondansetron (ZOFRAN-ODT) 4 MG disintegrating tablet Take 1 tablet (4 mg total) by mouth every 6 (six) hours as needed for nausea or vomiting.  20 tablet  0  . pantoprazole (PROTONIX) 40 MG tablet Take 40 mg by mouth 2 (two) times daily.      . predniSONE (DELTASONE) 20 MG tablet Take 2 tablets (40 mg) for the next 4 days.  Then take 20 mg daily until follow up with PCP.  20 tablet  0  . promethazine (PHENERGAN) 25 MG suppository Place 0.5 suppositories (12.5 mg total) rectally every 6 (six) hours as needed for nausea or vomiting.  12 each  0  . promethazine (PHENERGAN) 25 MG tablet Take 0.5 tablets (12.5 mg total) by mouth every 6 (six) hours as needed for nausea.  30 tablet  0  . rifaximin (XIFAXAN) 550 MG TABS tablet Take 1 tablet (550 mg total) by mouth 2 (two) times daily.  60 tablet  0  . spironolactone (ALDACTONE) 100 MG tablet Take 2 tablets (200 mg total) by mouth daily.  30 tablet  1  . torsemide (DEMADEX) 20 MG tablet Take 2 tablets (40 mg total) by mouth 2 (two) times daily.  60 tablet  0  . ursodiol (ACTIGALL) 300 MG capsule Take 1 capsule (300 mg total) by mouth 2 (two) times daily.  60 capsule  0    Objective: BP 96/34  Pulse 72  Temp(Src) 97.6 F (36.4 C) (Oral)   Resp 14  SpO2 100% Exam: General: Lethargic, ill-appearing 28 yo female laying with eyes closed HEENT: MMM, scleral icterus, PERRL Cardiovascular: RRR, II/VI SEM loudest at LUSB Respiratory: Nonlabored on room air, protecting airway, CTAB Abdomen: +BS, soft, obese, diffusely tender, +HSM Extremities: Warm and dry, no edema Skin: Jaundiced grossly, no wounds noted Neuro: Lethargic, intermittently alert with frequent redirection, oriented to person only. Follows no commands.   Labs and Imaging: CBC BMET   Recent Labs  Lab 08/16/13 1709  WBC 11.0*  HGB 10.8*  HCT 29.2*  PLT 68*    Recent Labs Lab 08/16/13 1709  NA 125*  K 3.5*  CL 83*  CO2 32  BUN 33*  CREATININE 1.30*  GLUCOSE 118*  CALCIUM 9.0     PORTABLE CHEST - 1 VIEW  FINDINGS:  The heart size and mediastinal contours are within normal limits.  Both lungs are clear. The visualized skeletal structures are  unremarkable.  IMPRESSION:  No active disease.  Hazeline Junker, MD 08/16/2013, 8:48 PM PGY-1, [facility] FPTS Intern pager: [phone], text pages welcome  I have seen the patient and agree with the above note.  Revisions are in blue.  Everlene Other DO Family Medicine PGY-2

## 2013-08-17 DIAGNOSIS — R609 Edema, unspecified: Secondary | ICD-10-CM

## 2013-08-17 DIAGNOSIS — D696 Thrombocytopenia, unspecified: Secondary | ICD-10-CM

## 2013-08-17 DIAGNOSIS — R188 Other ascites: Secondary | ICD-10-CM

## 2013-08-17 DIAGNOSIS — F1410 Cocaine abuse, uncomplicated: Secondary | ICD-10-CM

## 2013-08-17 DIAGNOSIS — G9340 Encephalopathy, unspecified: Secondary | ICD-10-CM

## 2013-08-17 DIAGNOSIS — R4182 Altered mental status, unspecified: Secondary | ICD-10-CM

## 2013-08-17 DIAGNOSIS — G40909 Epilepsy, unspecified, not intractable, without status epilepticus: Secondary | ICD-10-CM

## 2013-08-17 DIAGNOSIS — R7989 Other specified abnormal findings of blood chemistry: Secondary | ICD-10-CM

## 2013-08-17 DIAGNOSIS — G8929 Other chronic pain: Secondary | ICD-10-CM

## 2013-08-17 DIAGNOSIS — K7682 Hepatic encephalopathy: Principal | ICD-10-CM

## 2013-08-17 DIAGNOSIS — R17 Unspecified jaundice: Secondary | ICD-10-CM

## 2013-08-17 DIAGNOSIS — K7290 Hepatic failure, unspecified without coma: Principal | ICD-10-CM

## 2013-08-17 DIAGNOSIS — K754 Autoimmune hepatitis: Secondary | ICD-10-CM

## 2013-08-17 DIAGNOSIS — K8309 Other cholangitis: Secondary | ICD-10-CM

## 2013-08-17 DIAGNOSIS — K769 Liver disease, unspecified: Secondary | ICD-10-CM

## 2013-08-17 DIAGNOSIS — F1010 Alcohol abuse, uncomplicated: Secondary | ICD-10-CM

## 2013-08-17 DIAGNOSIS — R1011 Right upper quadrant pain: Secondary | ICD-10-CM

## 2013-08-17 DIAGNOSIS — R109 Unspecified abdominal pain: Secondary | ICD-10-CM

## 2013-08-17 DIAGNOSIS — N289 Disorder of kidney and ureter, unspecified: Secondary | ICD-10-CM

## 2013-08-17 DIAGNOSIS — R011 Cardiac murmur, unspecified: Secondary | ICD-10-CM

## 2013-08-17 DIAGNOSIS — D649 Anemia, unspecified: Secondary | ICD-10-CM

## 2013-08-17 LAB — TROPONIN I

## 2013-08-17 LAB — MAGNESIUM: MAGNESIUM: 2 mg/dL (ref 1.5–2.5)

## 2013-08-17 LAB — GLUCOSE, CAPILLARY
GLUCOSE-CAPILLARY: 166 mg/dL — AB (ref 70–99)
GLUCOSE-CAPILLARY: 153 mg/dL — AB (ref 70–99)
Glucose-Capillary: 222 mg/dL — ABNORMAL HIGH (ref 70–99)

## 2013-08-17 LAB — COMPREHENSIVE METABOLIC PANEL
ALT: 136 U/L — ABNORMAL HIGH (ref 0–35)
AST: 208 U/L — AB (ref 0–37)
Albumin: 1.5 g/dL — ABNORMAL LOW (ref 3.5–5.2)
Alkaline Phosphatase: 229 U/L — ABNORMAL HIGH (ref 39–117)
BUN: 29 mg/dL — ABNORMAL HIGH (ref 6–23)
CALCIUM: 8.3 mg/dL — AB (ref 8.4–10.5)
CO2: 28 mEq/L (ref 19–32)
Chloride: 87 mEq/L — ABNORMAL LOW (ref 96–112)
Creatinine, Ser: 1.14 mg/dL — ABNORMAL HIGH (ref 0.50–1.10)
GFR calc Af Amer: 76 mL/min — ABNORMAL LOW (ref >90)
GFR calc non Af Amer: 66 mL/min — ABNORMAL LOW (ref >90)
Glucose, Bld: 120 mg/dL — ABNORMAL HIGH (ref 70–99)
Potassium: 3.2 mEq/L — ABNORMAL LOW (ref 3.7–5.3)
Sodium: 126 mEq/L — ABNORMAL LOW (ref 137–147)
Total Bilirubin: 26.8 mg/dL (ref 0.3–1.2)
Total Protein: 4.7 g/dL — ABNORMAL LOW (ref 6.0–8.3)

## 2013-08-17 LAB — CBC
HCT: 26 % — ABNORMAL LOW (ref 36.0–46.0)
HEMOGLOBIN: 9.6 g/dL — AB (ref 12.0–15.0)
MCH: 35.4 pg — AB (ref 26.0–34.0)
MCHC: 36.9 g/dL — ABNORMAL HIGH (ref 30.0–36.0)
MCV: 95.9 fL (ref 78.0–100.0)
Platelets: 52 10*3/uL — ABNORMAL LOW (ref 150–400)
RBC: 2.71 MIL/uL — AB (ref 3.87–5.11)
RDW: 16 % — ABNORMAL HIGH (ref 11.5–15.5)
WBC: 8.7 10*3/uL (ref 4.0–10.5)

## 2013-08-17 LAB — URINALYSIS W MICROSCOPIC + REFLEX CULTURE
GLUCOSE, UA: NEGATIVE mg/dL
HGB URINE DIPSTICK: NEGATIVE
Ketones, ur: 15 mg/dL — AB
Nitrite: NEGATIVE
PH: 7 (ref 5.0–8.0)
Protein, ur: NEGATIVE mg/dL
SPECIFIC GRAVITY, URINE: 1.015 (ref 1.005–1.030)
Urobilinogen, UA: 0.2 mg/dL (ref 0.0–1.0)

## 2013-08-17 LAB — BASIC METABOLIC PANEL
BUN: 28 mg/dL — AB (ref 6–23)
CO2: 24 mEq/L (ref 19–32)
Calcium: 8.7 mg/dL (ref 8.4–10.5)
Chloride: 86 mEq/L — ABNORMAL LOW (ref 96–112)
Creatinine, Ser: 1.13 mg/dL — ABNORMAL HIGH (ref 0.50–1.10)
GFR calc Af Amer: 77 mL/min — ABNORMAL LOW (ref >90)
GFR, EST NON AFRICAN AMERICAN: 66 mL/min — AB (ref >90)
Glucose, Bld: 180 mg/dL — ABNORMAL HIGH (ref 70–99)
Potassium: 4.2 mEq/L (ref 3.7–5.3)
SODIUM: 125 meq/L — AB (ref 137–147)

## 2013-08-17 LAB — PROTIME-INR
INR: 3.65 — ABNORMAL HIGH (ref 0.00–1.49)
PROTHROMBIN TIME: 34.9 s — AB (ref 11.6–15.2)

## 2013-08-17 LAB — SALICYLATE LEVEL: Salicylate Lvl: <2 mg/dL — ABNORMAL LOW (ref 2.8–20.0)

## 2013-08-17 LAB — MRSA PCR SCREENING: MRSA by PCR: NEGATIVE

## 2013-08-17 LAB — CREATININE, URINE, RANDOM: Creatinine, Urine: 74.81 mg/dL

## 2013-08-17 LAB — ETHANOL: Alcohol, Ethyl (B): <11 mg/dL (ref 0–11)

## 2013-08-17 LAB — RAPID URINE DRUG SCREEN, HOSP PERFORMED
Amphetamines: NOT DETECTED
Barbiturates: NOT DETECTED
Benzodiazepines: NOT DETECTED
Cocaine: POSITIVE — AB
Opiates: POSITIVE — AB
Tetrahydrocannabinol: NOT DETECTED

## 2013-08-17 LAB — ACETAMINOPHEN LEVEL: Acetaminophen (Tylenol), Serum: <15 ug/mL (ref 10–30)

## 2013-08-17 LAB — OCCULT BLOOD X 1 CARD TO LAB, STOOL: Fecal Occult Bld: POSITIVE — AB

## 2013-08-17 LAB — SODIUM, URINE, RANDOM: Sodium, Ur: 38 mEq/L

## 2013-08-17 MED ORDER — LACTULOSE 10 GM/15ML PO SOLN
20.0000 g | Freq: Four times a day (QID) | ORAL | Status: DC | PRN
  Administered 2013-08-18: 20 g via ORAL
  Filled 2013-08-17 (×2): qty 30

## 2013-08-17 MED ORDER — POTASSIUM CHLORIDE 10 MEQ/100ML IV SOLN
10.0000 meq | INTRAVENOUS | Status: AC
  Administered 2013-08-17 (×4): 10 meq via INTRAVENOUS
  Filled 2013-08-17 (×4): qty 100

## 2013-08-17 NOTE — H&P (Signed)
FMTS Attending Admission Note: Renold DonJeff Findley Vi MD Personal pager:  [phone] FPTS Service Pager:  [phone]  I  have seen and examined this patient, reviewed their chart. I have discussed this patient with the resident. I agree with the resident's findings, assessment and care plan.  Additionally:  Briefly, 28 yo F with PMH for autoimmune hepatitis, primary sclerosing cholangitis, history of ongoing substance abuse, cirrhosis, previous admissions for altered mental status presenting with one-day history of altered mental status. Patient was just discharged home 2 days ago. Grandmother states that she "felt weak" but otherwise was her normal self. Starting yesterday grandmother states that she became increasingly confused. Brought to emergency department for evaluation. Due to her altered mental status she is admitted in family practice teaching service.  This morning she is more awake but not quite alert. She cancel questions appropriately. She does have some confusion and she basically looks drunk to me.  Complaining of some mild abdominal pain and lower back pain.  Asking for food.  Exam: Gen:  African American female lying in bed. Jaundiced. She is awake. HEENT: Pupils equal and reactive to light. Icterus noted Heart:  RRR with Grade III Murmur noted Lungs:  Clear Abdomen:  Soft. Organomegaly noted. Mild epigastric tenderness but she can distract her from. Otherwise benign abdomen. Extremities: +1 edema bilateral ankles  Impression/plan: #1. Acute hepatic encephalopathy:. - Agree with lactulose. Unclear she is taking this at home. -Likely secondary to opiates and cocaine which is positive her UDS. We are also obtaining an alcohol level and Tylenol level today. - Consider stopping the Haldol so we can better appreciate any changes in mental status. - Serial mental status checks  2.  ESLD: - not doing well - MELD score very elevated - Agree with management.  Follow LFTs  3.   Hypotension: - FeNa <1.  Likely evidence of start of hepatorenal syndrome.    Tobey GrimJeffrey H Kellis Mcadam, MD 08/17/2013 8:54 AM

## 2013-08-17 NOTE — Progress Notes (Signed)
Pt able to take the scheduled pills whole with sips of water. No difficulty swallowing. Tolerated well.

## 2013-08-17 NOTE — Progress Notes (Signed)
FMTS Attending  Note: Kehinde Eniola,MD I  have seen and examined this patient, reviewed their chart. I have discussed this patient with the resident. I agree with the resident's findings, assessment and care plan.  

## 2013-08-17 NOTE — Progress Notes (Signed)
Family Medicine Teaching Service Daily Progress Note Intern Pager: [phone]  Patient name: [name] Medical record number: [id number] Date of birth: 1985-09-07 Age: 28 y.o. Gender: female  Primary Care Provider: Levert Feinstein, MD Consultants: None Code Status: Full  Pt Overview and Major Events to Date:  1/8: Hepatic encephalopathy  Assessment and Plan: [name] is a 28 y.o. female presenting with confusion, lethargy consistent with hepatic encephalopathy. PMH is significant for autoimmune hepatitis, primary sclerosing cholangitis, substance abuse (cocaine, marijuana), cirrhosis and esophageal and gastric varices.   Acute encephalopathy: most likely hepatic etiology with Ammonia 65, but Cocaine + on admit and Hyponatremic  - Lactulose prn - Haldol 5mg  IV prn agitation  - UDS; Cocaine positive  - Tylenol, salicylate, ethanol level pending   Liver failure due to autoimmune hepatitis, PSC, and substance abuse. 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 MELD 32 (52.6% 66-month mortality). Very poor prognosis. LFTs elevated. Albumin 1.7, INR 3.65 (previously 2.48). Total bilirubin: 28.9 (previously 16.4) - Continue home meds: Rifaximin; Ursodiol; Cholestyramine; Atarax prn  - Holding diuretics for hypotension: Home doses (Torsemide 40mg  BID, & Spironolactone 200mg  daily ) - Discriminant function 88.7 (based on PT from 1/1): Will benefit from Prednisone 40mg  x 1 month.   Thrombocytopenia: Plt 68, INR 2.48. Will hold pharmacologic VTE prophylaxis and continue to monitor.  - Plt: 52  Creatinine elevation: AKI due to encephalopathy and poor hydration (supported by BUN:Cr ~ 20:1) and/or hepatorenal syndrome, given long-standing cirrhosis and sequelae.  - Will give IVF, though this may be third-spaced. May require albumin if no improvement in creatinine.  - FENa: 0.46; On diuretics & CMET obtained  4 hrs after urine collection - Cr improving 1.14 1/8; Was 1.3 on admit  Hyponatremia:  Related to liver pathology. S/p 1L bolus in ED,  - Na 126 1/9; [125 on admit 1/8] - IVF as below  Hypokalemia:  - Mg: 2.0 - Monitor and replace as needed - 3.2 on 1/8; IV Potassium today x 4; BMET @ 2 pm  Hypochloremia:  - Monitoring; IVF as below   Abnormal ECG: ECG in ED NSR with IVCD read as LBBB, without ST segment changes, with t-wave concordance. Doubt this is significant in her presentation.  - ECG: Sinus Bradycardia - Check Trop (Cocaine + on admission) - Telemetry   Anemia:  - Chronic, stable. Normocytic, hypochromic, due to chronic disease. Hgb: 10.8 (admit 1/8) - Hgb: 9.6 1/9; Likely dilutional w/ IVF; No immediate concern for variceal bleed  - FOBT pending  Hypotension: - Improved  - BP: 109/46 - Will continue IVF, responsive to 1L bolus in ED. Holding diuretics.  - Consider Albumin or stress steroids if worsens   Leukocytosis: Resolved Afebrile without infection source. WBC 11.0 on admit near baseline. Will monitor.  - WBC 8.7 1/8   Seizure disorder: No convulsive activity currently. Continue home keppra 750mg  BID.   FEN/GI: NS @ 50 mL/hr, NPO given mental status  Prophylaxis: SCDs, hold pharmacologic tx given INR 2.48, plt 68   Disposition: Admit to FMTS, attending Dr. Gwendolyn Grant on telemetry floor  Subjective:  Sleeping but easily arouslable. Just says " I'm feeling bad "; no specific complaints.   Objective: Temp:  [97.1 F (36.2 C)-97.7 F (36.5 C)] 97.1 F (36.2 C) (01/09 0516) Pulse Rate:  [51-72] 54 (01/09 0516) Resp:  [12-18] 18 (01/09 0516) BP: (96-119)/(34-59) 109/46 mmHg (01/09 0516) SpO2:  [94 %-100 %] 96 % (01/09 0516) Weight:  [163 lb 8 oz (74.163 kg)] 163 lb 8 oz (  74.163 kg) (01/08 2159)  Physical Exam: General: Lethargic, ill-appearing 28 yo female asleep  HEENT: MMM, scleral icterus, pupils; No nystagmus   Cardiovascular: RRR, no m/r/g  Respiratory: Nonlabored on room air, protecting airway, CTAB  Abdomen: +BS, soft, obese, diffusely  tender, +HSM  Extremities: Cool and dry, +2 pulses; no edema; Asterixis present Skin: Jaundiced grossly, no wounds noted  Neuro: Lethargic, intermittently alert with frequent redirection, oriented to person only. Follows no commands.   Laboratory:  Recent Labs Lab 08/16/13 1709 08/17/13 0520  WBC 11.0* 8.7  HGB 10.8* 9.6*  HCT 29.2* 26.0*  PLT 68* 52*    Recent Labs Lab 08/16/13 1709 08/17/13 0520  NA 125* 126*  K 3.5* 3.2*  CL 83* 87*  CO2 32 28  BUN 33* 29*  CREATININE 1.30* 1.14*  CALCIUM 9.0 8.3*  PROT 5.2* 4.7*  BILITOT 28.9* 26.8*  ALKPHOS 241* 229*  ALT 139* 136*  AST 210* 208*  GLUCOSE 118* 120*   Ammonia: 65 (1/8/) Lipase: 18 UDS: Cocaine and O;iate (+)  Imaging/Diagnostic Tests: PORTABLE CHEST - 1 VIEW  FINDINGS:  The heart size and mediastinal contours are within normal limits.  Both lungs are clear. The visualized skeletal structures are  unremarkable.  IMPRESSION:  No active disease.  Wenda LowJames Jurrell Royster, MD 08/17/2013, 9:30 AM PGY-1, [facility] FPTS Intern pager: [phone], text pages welcome

## 2013-08-17 NOTE — Progress Notes (Signed)
INITIAL NUTRITION ASSESSMENT  DOCUMENTATION CODES Per approved criteria  -Not Applicable   INTERVENTION: Diet advancement per MD discretion RD to continue to monitor and provide further intervention as needed  NUTRITION DIAGNOSIS: Predicted suboptimal energy intake related to decreased appetite PTA as evidenced by family report, current NPO status, and varied weights.   Goal: Pt to meet >/= 90% of their estimated nutrition needs   Monitor:  Diet advancement PO intake Weights Labs  Reason for Assessment: Malnutrition Screening Tool, score of 2  28 y.o. female  Admitting Dx: <principal problem not specified>  ASSESSMENT: 28 yo F with PMH for autoimmune hepatitis, primary sclerosing cholangitis, history of ongoing substance abuse, cirrhosis, previous admissions for altered mental status presenting with one-day history of altered mental status. Patient was just discharged home 2 days ago. Grandmother states that she "felt weak" but otherwise was her normal self. Starting yesterday grandmother states that she became increasingly confused. Pt asleep at time of visit. Per pt's grandmother pt was eating fairly well PTA but, pt has had a decreased appetite the past few days. Per MD note, pt was asking for food this AM. Pt currently NPO but, pt's grandmother reports pt has been drinking juice all morning.  Pt seen by RD 07/20/13, pt weighed 165 lbs and pt reported having a good appetite. Pt's weight has fluctuated up and down since.  Labs: low sodium, low potassium, low albumin, elevated AST and ALT, high bilirubin, low GFR.  Height: Ht Readings from Last 1 Encounters:  08/16/13 5' 2.5" (1.588 m)    Weight: Wt Readings from Last 1 Encounters:  08/16/13 163 lb 8 oz (74.163 kg)    Ideal Body Weight: 112 lbs  % Ideal Body Weight: 146%  Wt Readings from Last 10 Encounters:  08/16/13 163 lb 8 oz (74.163 kg)  08/09/13 195 lb 5.2 oz (88.599 kg)  07/30/13 181 lb (82.101 kg)   07/28/13 175 lb (79.379 kg)  07/27/13 160 lb (72.576 kg)  07/26/13 179 lb 6.4 oz (81.375 kg)  06/14/13 167 lb 11.2 oz (76.068 kg)  05/16/13 176 lb (79.833 kg)  03/27/13 165 lb 2 oz (74.9 kg)  03/22/13 172 lb (78.019 kg)    Usual Body Weight: unknown  % Usual Body Weight: NA  BMI:  Body mass index is 29.41 kg/(m^2). (Overweight)  Estimated Nutritional Needs: Kcal: 1800-2000 Protein: 60-70 grams Fluid: 2.3 L/day  Skin: non-pitting RLE and LLE edema  Diet Order: NPO  EDUCATION NEEDS: -No education needs identified at this time   Intake/Output Summary (Last 24 hours) at 08/17/13 1115 Last data filed at 08/17/13 0747  Gross per 24 hour  Intake      0 ml  Output   1500 ml  Net  -1500 ml    Last BM: PTA  Labs:   Recent Labs Lab 08/16/13 1709 08/17/13 0520  NA 125* 126*  K 3.5* 3.2*  CL 83* 87*  CO2 32 28  BUN 33* 29*  CREATININE 1.30* 1.14*  CALCIUM 9.0 8.3*  MG  --  2.0  GLUCOSE 118* 120*    CBG (last 3)  No results found for this basename: GLUCAP,  in the last 72 hours  Scheduled Meds: . acidophilus  2 capsule Oral Daily  . cholestyramine  4 g Oral Q12H  . folic acid  1 mg Oral Daily  . levETIRAcetam  750 mg Oral Q12H  . pantoprazole  40 mg Oral BID  . potassium chloride  10 mEq Intravenous Q1 Hr  x 4  . predniSONE  40 mg Oral Q breakfast  . rifaximin  550 mg Oral BID  . sodium chloride  3 mL Intravenous Q12H  . sodium chloride  3 mL Intravenous Q12H  . ursodiol  300 mg Oral BID    Continuous Infusions:   Past Medical History  Diagnosis Date  . Substance abuse April 2013    Cocaine plus THC usage - delisted from liver transplant list.   . Esophageal varices     distal esophageal grade 1 varices, portal gastropathy on EGDs in 01/2010 and 07/2012:  no banding or other intervention undertaken on EGDs from 2009 - 07/2012.    [name]. Cirrhosis of liver   . Autoimmune hepatitis     Confirmed via liver biopsy 2002  . PSC (primary sclerosing  cholangitis)   . Seizures     onset in 2009 associated with vent requiring resp failure.   . Thrombocytopenia     associated with cirrhosis.  followed by Dr Alcide EvenerSherril  . Intracranial hemorrhage 2009    in association with severe thrombocytopenia  . Heart murmur   . Anemia   . History of blood transfusion     "related to my liver" (07/30/2013)  . Headache(784.0)     "recently real bad headaches; take hydrocodone for them" (07/30/2013)  . Stroke 2009    "lost my peripheral vision" (07/30/2013)  . Anxiety   . Depression   . Primary sclerosing cholangitis     [name]/notes 07/30/2013    Past Surgical History  Procedure Laterality Date  . Esophagogastroduodenoscopy  07/18/2012    Procedure: ESOPHAGOGASTRODUODENOSCOPY (EGD);  Surgeon: Louis Meckelobert D Kaplan, MD;  [facility];  Service: Endoscopy;  Laterality: N/A;  . Gastric varices banding  07/18/2012    Procedure: GASTRIC VARICES BANDING;  Surgeon: Louis Meckelobert D Kaplan, MD;  Location: [facility];  Service: Endoscopy;  Laterality: N/A;  . Ercp  01/2010    Sclerosing Cholangitis with no dominant stricures   . Liver biopsy  2002    Ian Malkineanne Barnett RD, LDN Inpatient Clinical Dietitian Pager: [phone] After Hours Pager: [phone]

## 2013-08-17 NOTE — Progress Notes (Signed)
UR complete.  Jullie Arps RN, MSN 

## 2013-08-18 LAB — BASIC METABOLIC PANEL
BUN: 26 mg/dL — ABNORMAL HIGH (ref 6–23)
CALCIUM: 8.5 mg/dL (ref 8.4–10.5)
CO2: 25 mEq/L (ref 19–32)
CREATININE: 1.2 mg/dL — AB (ref 0.50–1.10)
Chloride: 86 mEq/L — ABNORMAL LOW (ref 96–112)
GFR, EST AFRICAN AMERICAN: 72 mL/min — AB (ref >90)
GFR, EST NON AFRICAN AMERICAN: 62 mL/min — AB (ref >90)
Glucose, Bld: 143 mg/dL — ABNORMAL HIGH (ref 70–99)
Potassium: 4.2 mEq/L (ref 3.7–5.3)
Sodium: 123 mEq/L — ABNORMAL LOW (ref 137–147)

## 2013-08-18 LAB — GLUCOSE, CAPILLARY
GLUCOSE-CAPILLARY: 148 mg/dL — AB (ref 70–99)
GLUCOSE-CAPILLARY: 194 mg/dL — AB (ref 70–99)
Glucose-Capillary: 130 mg/dL — ABNORMAL HIGH (ref 70–99)
Glucose-Capillary: 130 mg/dL — ABNORMAL HIGH (ref 70–99)
Glucose-Capillary: 196 mg/dL — ABNORMAL HIGH (ref 70–99)
Glucose-Capillary: 266 mg/dL — ABNORMAL HIGH (ref 70–99)

## 2013-08-18 LAB — CBC
HCT: 25.6 % — ABNORMAL LOW (ref 36.0–46.0)
Hemoglobin: 9.5 g/dL — ABNORMAL LOW (ref 12.0–15.0)
MCH: 35.1 pg — ABNORMAL HIGH (ref 26.0–34.0)
MCHC: 37.1 g/dL — AB (ref 30.0–36.0)
MCV: 94.5 fL (ref 78.0–100.0)
PLATELETS: 62 10*3/uL — AB (ref 150–400)
RBC: 2.71 MIL/uL — ABNORMAL LOW (ref 3.87–5.11)
RDW: 16.4 % — AB (ref 11.5–15.5)
WBC: 14.2 10*3/uL — AB (ref 4.0–10.5)

## 2013-08-18 MED ORDER — SODIUM CHLORIDE 0.9 % IV BOLUS (SEPSIS)
1000.0000 mL | Freq: Once | INTRAVENOUS | Status: AC
  Administered 2013-08-18: 1000 mL via INTRAVENOUS

## 2013-08-18 MED ORDER — MORPHINE SULFATE 2 MG/ML IJ SOLN
1.0000 mg | INTRAMUSCULAR | Status: DC | PRN
  Administered 2013-08-18 – 2013-08-19 (×5): 1 mg via INTRAVENOUS
  Filled 2013-08-18 (×6): qty 1

## 2013-08-18 NOTE — Progress Notes (Signed)
Family Medicine Teaching Service Daily Progress Note Intern Pager: [phone]  Patient name: [name] [place] Medical record number: [id number] Date of birth: May 13, 1986 Age: 28 y.o. Gender: female  Primary Care Provider: Levert FeinsteinMcIntyre, Brittany, MD Consultants: None Code Status: Full  Pt Overview and Major Events to Date:  1/8: Hepatic encephalopathy  Assessment and Plan: [name] Kissling is a 28 y.o. female presenting with confusion, lethargy consistent with hepatic encephalopathy. PMH is significant for autoimmune hepatitis, primary sclerosing cholangitis, substance abuse (cocaine, marijuana), cirrhosis and esophageal and gastric varices.   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 MELD 32 (52.6% 7037-month mortality). Very poor prognosis. LFTs elevated. Albumin 1.7, INR 3.65 (previously 2.48). Total bilirubin: 28.9 (previously 16.4) - Continue home meds: Rifaximin; Ursodiol; Cholestyramine; Atarax prn  - Holding diuretics for hypotension: Home doses (Torsemide 40mg  BID, & Spironolactone 200mg  daily ) - Discriminant function 88.7 (based on PT from 1/1): Will benefit from Prednisone 40mg  x 1 month.  - Ultimately her liver failure continues to progress (MELD Score 35) and pt with active GI bleed which will continue with an INR of 3.6; Pt was seen by Dr. Leone PayorGessner of [facility] on 12/31 and there was no further invention to offer and palliative was recommended. In speaking with Dr. Christella HartiganJacobs this morning, he is in agreement with the earlier assessment made by Dr. Leone PayorGessner  Anemia with GI Bleed:  - Chronic, stable. Normocytic, hypochromic, due to chronic disease. Hgb: 10.8 (admit 1/8) - Hgb: 9.6 1/9; Likely dilutional w/ IVF; No  immediate concern for variceal bleed  - FOBT positive in setting of INR 3.65, likely to need FFP and transfusion today if hemoglobin drops; pt did state that she would likely consider Hospice if hbg continues to trend down   Abdominal Pain - pt with intermittent abdominal pain and clearly exaggerating symptoms for secondary gain, however, she is tremulous so I am concerned about opioid withdrawal - Given low dose morphine 1 mg IV q 2 hrs PRN  Thrombocytopenia: Plt 68, INR 2.48. Will hold pharmacologic VTE prophylaxis and continue to monitor.  - Plt: 52  Creatinine elevation: AKI due to encephalopathy and poor hydration (supported by BUN:Cr ~ 20:1) and/or hepatorenal syndrome, given long-standing cirrhosis and sequelae.  - Will give IVF, though this may be third-spaced. May require albumin if no improvement in creatinine.  - FENa: 0.46; On diuretics & CMET obtained  4 hrs after urine collection - mild increased to 1.2, cont to trned  Hyponatremia: Related to liver pathology. S/p 1L bolus in ED,  - Na 126 1/9; [125 on admit 1/8] - Encouarge PO   Hypokalemia:  - Mg: 2.0 - Monitor and replace as needed - 3.2 on 1/8; IV Potassium today x 4; BMET @ 2 pm  Hypochloremia:  - Monitoring; IVF as below   Abnormal ECG: ECG in ED NSR with IVCD read as LBBB, without ST segment changes, with t-wave concordance. Doubt this is significant in her presentation.  - ECG: Sinus Bradycardia - Trop neg x 1 - Telemetry   Hypotension: - Improved  - BP: 109/46 - Will continue IVF, responsive to 1L bolus in ED. Holding diuretics.  - Consider Albumin or stress steroids if worsens - Give bolus for BP < 90/40  Leukocytosis: Resolved Afebrile without infection source. WBC 11.0 on admit near baseline. Will monitor.  - May be due to steroids, cont to follow   Seizure disorder: No convulsive activity currently. Continue home keppra 750mg  BID.   FEN/GI: NS @ 50 mL/hr, NPO given mental status  Prophylaxis:  SCDs, hold pharmacologic tx given INR 2.48, plt 68   Disposition: Admit to FMTS,   Subjective:   Awake, complaining of abdominal pain, but easily distracted from the pain; just ate without problems; cannot remember last BM; when discussing her prognosis, pt states that is her hgb continues to decline, then she will probably "go with Hospice"   Objective: Temp:  [97.1 F (36.2 C)-97.6 F (36.4 C)] 97.6 F (36.4 C) (01/10 0103) Pulse Rate:  [54-72] 67 (01/10 0103) Resp:  [18] 18 (01/10 0103) BP: (105-114)/(44-46) 105/46 mmHg (01/10 0103) SpO2:  [96 %-100 %] 98 % (01/10 0103)  Physical Exam: General: Lethargic, ill-appearing 28 yo female asleep, extremely jaundiced  HEENT: MMM, scleral icterus, pupils; No nystagmus   Cardiovascular: RRR, no m/r/g  Respiratory: Nonlabored on room air, protecting airway, CTAB  Abdomen: +BS, soft, obese, diffusely tender, +HSM  Extremities: Cool and dry, +2 pulses; no edema; Asterixis present Skin: Jaundiced grossly, no wounds noted  Neuro: interactive, alert & oriented, emotionally labile  Laboratory:  Recent Labs Lab 08/16/13 1709 08/17/13 0520  WBC 11.0* 8.7  HGB 10.8* 9.6*  HCT 29.2* 26.0*  PLT 68* 52*    Recent Labs Lab 08/16/13 1709 08/17/13 0520 08/17/13 1432  NA 125* 126* 125*  K 3.5* 3.2* 4.2  CL 83* 87* 86*  CO2 32 28 24  BUN 33* 29* 28*  CREATININE 1.30* 1.14* 1.13*  CALCIUM 9.0 8.3* 8.7  PROT 5.2* 4.7*  --   BILITOT 28.9* 26.8*  --   ALKPHOS 241* 229*  --   ALT 139* 136*  --   AST 210* 208*  --   GLUCOSE 118* 120* 180*   Ammonia: 65 (1/8/) Lipase: 18 UDS: Cocaine and Opiate (+)  Imaging/Diagnostic Tests: PORTABLE CHEST - 1 VIEW  FINDINGS:  The heart size and mediastinal contours are within normal limits.  Both lungs are clear. The visualized skeletal structures are  unremarkable.  IMPRESSION:  No active disease.  Garnetta Buddy, MD 08/18/2013, 1:43 AM [facility] FPTS Intern pager:  [phone], text pages welcome

## 2013-08-18 NOTE — Progress Notes (Signed)
Patient NW:GNFAOZH:[name]      DOB: June 30, 1986      YQM:[id number]   Consult to review services " at home" .  Reviewed chart and do not see that Palliative Care was providing and requested at home.  I do not see any reference to this during last discharge.  The patient has continued to refuse hospice services and had declined in patient rehab.  I will be glad to stop by and continue to provide emotional support .  I can not force [name] to make a change in her life.  Will fit in as soon as possible today.  [name] L. Ladona Ridgelaylor, MD MBA The Palliative Medicine Team at [facility] Team Phone: [phone] Pager: [phone]

## 2013-08-18 NOTE — Progress Notes (Signed)
FMTS Attending  Note: Terri Ditton,MD I  have seen and examined this patient, reviewed their chart. I have discussed this patient with the resident. I agree with the resident's findings, assessment and care plan.  Patient does not seem to be doing well in general,she complaints of abdominal pain which has been on going for weeks,as well as right elbow pain,mainly from the IV line site,she also sounds a little confused this morning.  Exam: Gen: Awake and alert,a little incoherent. HEENT: Sclera is jaundiced. EOMI,PERRLA. Resp: Air entry equal B/L CV: S1 S2 norml,no murmurs. Abd: Distended,mild tenderness mostly on the right quadrant. BS normal. Ext: Trace B/L edema.  A/P: 28 y/O  F with: 1. Acute encephalopathy likely hepatic encephalopathy.     CT head done during last admission less than a month ago was negative for ischemia or bleed.     Seem to be more awake and alert now but still confused.     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     Neuro check recommended.     Monitor for improvement.  2. ESLD: Gradually worsening liver function.    INR elevated with low platelet.     + fecal occult testing.     I recommended telephone discussion with GI if they would see patient or to continue palliative care.     Recommend palliative f/u as well.  Please review resident's note for detail documentation.

## 2013-08-19 DIAGNOSIS — K769 Liver disease, unspecified: Secondary | ICD-10-CM

## 2013-08-19 DIAGNOSIS — Z515 Encounter for palliative care: Secondary | ICD-10-CM

## 2013-08-19 DIAGNOSIS — E871 Hypo-osmolality and hyponatremia: Secondary | ICD-10-CM

## 2013-08-19 LAB — BASIC METABOLIC PANEL
BUN: 22 mg/dL (ref 6–23)
BUN: 21 mg/dL (ref 6–23)
CALCIUM: 8 mg/dL — AB (ref 8.4–10.5)
CHLORIDE: 82 meq/L — AB (ref 96–112)
CO2: 24 meq/L (ref 19–32)
CO2: 20 meq/L (ref 19–32)
CREATININE: 1.37 mg/dL — AB (ref 0.50–1.10)
Calcium: 8.2 mg/dL — ABNORMAL LOW (ref 8.4–10.5)
Chloride: 83 mEq/L — ABNORMAL LOW (ref 96–112)
Creatinine, Ser: 1.28 mg/dL — ABNORMAL HIGH (ref 0.50–1.10)
GFR calc Af Amer: 66 mL/min — ABNORMAL LOW (ref >90)
GFR calc non Af Amer: 57 mL/min — ABNORMAL LOW (ref >90)
GFR calc non Af Amer: 53 mL/min — ABNORMAL LOW (ref >90)
GFR, EST AFRICAN AMERICAN: 61 mL/min — AB (ref >90)
GLUCOSE: 182 mg/dL — AB (ref 70–99)
Glucose, Bld: 237 mg/dL — ABNORMAL HIGH (ref 70–99)
Potassium: 4.7 mEq/L (ref 3.7–5.3)
Potassium: 4.2 mEq/L (ref 3.7–5.3)
Sodium: 119 mEq/L — CL (ref 137–147)
Sodium: 115 mEq/L — CL (ref 137–147)

## 2013-08-19 LAB — COMPREHENSIVE METABOLIC PANEL
ALT: 145 U/L — AB (ref 0–35)
AST: 131 U/L — ABNORMAL HIGH (ref 0–37)
Albumin: 1.7 g/dL — ABNORMAL LOW (ref 3.5–5.2)
Alkaline Phosphatase: 248 U/L — ABNORMAL HIGH (ref 39–117)
BUN: 23 mg/dL (ref 6–23)
CO2: 23 meq/L (ref 19–32)
CREATININE: 1.5 mg/dL — AB (ref 0.50–1.10)
Calcium: 8.5 mg/dL (ref 8.4–10.5)
Chloride: 82 mEq/L — ABNORMAL LOW (ref 96–112)
GFR calc Af Amer: 55 mL/min — ABNORMAL LOW (ref >90)
GFR, EST NON AFRICAN AMERICAN: 47 mL/min — AB (ref >90)
GLUCOSE: 182 mg/dL — AB (ref 70–99)
Potassium: 4.9 mEq/L (ref 3.7–5.3)
SODIUM: 118 meq/L — AB (ref 137–147)
TOTAL PROTEIN: 5 g/dL — AB (ref 6.0–8.3)
Total Bilirubin: 28.3 mg/dL (ref 0.3–1.2)

## 2013-08-19 LAB — GLUCOSE, CAPILLARY
GLUCOSE-CAPILLARY: 179 mg/dL — AB (ref 70–99)
GLUCOSE-CAPILLARY: 242 mg/dL — AB (ref 70–99)
GLUCOSE-CAPILLARY: 225 mg/dL — AB (ref 70–99)
Glucose-Capillary: 251 mg/dL — ABNORMAL HIGH (ref 70–99)
Glucose-Capillary: 197 mg/dL — ABNORMAL HIGH (ref 70–99)

## 2013-08-19 LAB — CBC
HCT: 25 % — ABNORMAL LOW (ref 36.0–46.0)
HEMOGLOBIN: 9.4 g/dL — AB (ref 12.0–15.0)
MCH: 36 pg — ABNORMAL HIGH (ref 26.0–34.0)
MCHC: 37.6 g/dL — AB (ref 30.0–36.0)
MCV: 95.8 fL (ref 78.0–100.0)
Platelets: 74 10*3/uL — ABNORMAL LOW (ref 150–400)
RBC: 2.61 MIL/uL — AB (ref 3.87–5.11)
RDW: 16.4 % — ABNORMAL HIGH (ref 11.5–15.5)
WBC: 12.9 10*3/uL — ABNORMAL HIGH (ref 4.0–10.5)

## 2013-08-19 LAB — OSMOLALITY: Osmolality: 261 mOsm/kg — ABNORMAL LOW (ref 275–300)

## 2013-08-19 LAB — PROTIME-INR
INR: 4.06 — ABNORMAL HIGH (ref 0.00–1.49)
Prothrombin Time: 37.9 seconds — ABNORMAL HIGH (ref 11.6–15.2)

## 2013-08-19 MED ORDER — LACTULOSE 10 GM/15ML PO SOLN
20.0000 g | Freq: Four times a day (QID) | ORAL | Status: DC
Start: 2013-08-19 — End: 2013-08-19
  Filled 2013-08-19 (×4): qty 30

## 2013-08-19 MED ORDER — SODIUM CHLORIDE 0.9 % IV SOLN: 250.0000 mg | Freq: Three times a day (TID) | INTRAVENOUS | Status: DC

## 2013-08-19 MED ORDER — ONDANSETRON 4 MG PO TBDP
4.0000 mg | ORAL_TABLET | Freq: Once | ORAL | Status: AC
  Administered 2013-08-19: 4 mg via ORAL
  Filled 2013-08-19: qty 1

## 2013-08-19 MED ORDER — MORPHINE SULFATE 4 MG/ML IJ SOLN
4.0000 mg | INTRAMUSCULAR | Status: DC | PRN
  Administered 2013-08-19 (×4): 4 mg via INTRAVENOUS
  Administered 2013-08-19: 2 mg via INTRAVENOUS
  Administered 2013-08-20 – 2013-08-21 (×4): 4 mg via INTRAVENOUS
  Filled 2013-08-19 (×8): qty 1

## 2013-08-19 MED ORDER — MORPHINE SULFATE 2 MG/ML IJ SOLN: 2.0000 mg | INTRAMUSCULAR | Status: DC | PRN

## 2013-08-19 MED ORDER — LACTULOSE 10 GM/15ML PO SOLN
20.0000 g | Freq: Four times a day (QID) | ORAL | Status: DC
  Administered 2013-08-19 – 2013-08-22 (×14): 20 g via ORAL
  Filled 2013-08-19 (×16): qty 30

## 2013-08-19 MED ORDER — HYDROCORTISONE SOD SUCCINATE 100 MG IJ SOLR
50.0000 mg | Freq: Four times a day (QID) | INTRAMUSCULAR | Status: DC
  Administered 2013-08-19: 15:00:00 via INTRAVENOUS
  Administered 2013-08-19 – 2013-08-22 (×11): 50 mg via INTRAVENOUS
  Administered 2013-08-22: 05:00:00 via INTRAVENOUS
  Filled 2013-08-19 (×16): qty 1

## 2013-08-19 MED ORDER — METOCLOPRAMIDE HCL 5 MG/ML IJ SOLN
10.0000 mg | Freq: Three times a day (TID) | INTRAMUSCULAR | Status: DC
  Administered 2013-08-20 – 2013-08-22 (×4): 10 mg via INTRAVENOUS
  Filled 2013-08-19 (×12): qty 2

## 2013-08-19 MED ORDER — METOCLOPRAMIDE HCL 10 MG PO TABS
10.0000 mg | ORAL_TABLET | Freq: Three times a day (TID) | ORAL | Status: DC
  Administered 2013-08-19 – 2013-08-22 (×7): 10 mg via ORAL
  Filled 2013-08-19 (×12): qty 1

## 2013-08-19 NOTE — Progress Notes (Signed)
CRITICAL VALUE ALERT  Critical value received: Na 119  Date of notification:  1/11  Time of notification:  0719  Critical value read back: yes  Nurse who received alert:  Theseus Birnie [place]  MD notified (1st page): Dr. Ermalinda MemosBradshaw  Time of first page:  0735  MD notified (2nd page):  Time of second page:  Responding MD:  Dr. Ermalinda MemosBradshaw  Time MD responded:  [phone]

## 2013-08-19 NOTE — Progress Notes (Addendum)
Persistent hypo-natremia that is worsening.  Will order serum OSMs as well as Urine OSMs as to further evaluate however this is likely indicative of her significantly worsening end-stage liver failure with a Meld-Na score documented currently at 38 using Bilirubin, INR from 1/9.    Recheck CMET, INR now.  Fluid restrict 500 cc daily.  Patient has been seen by multiple specialists and is not a candidate for a liver transplant.  Her last hospitalization and December [facility] transplant center said they had no services to offer her that we did not have here and declined transfer.  Given she has had continued cocaine abuse she continues to not be a transplant candidate and unfortunately I do not think she would survive until that time.  Will readdress considerations for comfort measures given the significant likelihood of poor outcomes.  Gastroenterology was consult to during the last hospital admission as well and had nothing further to offer her - please see consult note from 08/08/13.  Andrena MewsMichael D Rigby, DO [facility] Resident - PGY-3 08/19/2013 1:47 PM   Update: Micah FlesherWent by and discussed with Alphonzo LemmingsWhitney and family members regarding worsening prognosis.  They are now more agreeable to discussing with palliative care.  I did inform them that hyponatremia to this degree can be imminently life-threatening and in this case is secondary to her end-stage liver disease.  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  She is to remain full code.  Repeat labs pending.   No evidence to support (and some recommendations against) the use of hypertonic saline and Tolvaptam contraindicated in end-stage liver disease patients.  Continue with aggressive fluid restriction.  Avoid diuresis.  Continue to monitor closely.    Pt's prior PCP Dr. Armen PickupFunches  is going to be coming by to see her; I have also updated the Palliative care team regarding agreeableness to discuss goals of care.  Andrena MewsMichael D Rigby, DO [facility] Resident - PGY-3 08/19/2013 3:51 PM

## 2013-08-19 NOTE — Progress Notes (Signed)
Pt refusing to have bed alarm turned on. Educated pt on safety protocol and need for bed alarm. Pt angry and wants to get up on her on. Refuses to get in bed or sit down after getting up by herself.

## 2013-08-19 NOTE — Progress Notes (Signed)
FMTS Attending  Note: Terri Rennaker,MD I  have seen and examined this patient, reviewed their chart. I have discussed this patient with the resident. I agree with the resident's findings, assessment and care plan.  Patient's condition does not seem to be improving,her sodium level continue to decline with a level of 119,unfortunately hyponatremia is a common problem in patient with severe liver cirrhosis/ESLF. There is no data that increasing Na+ or treating hyponatremia in severe cirrhosis would improve hemodynamic status of this patient,for now we would fluid restrict her and if no improvement would consider hypertonic saline as a last resort.

## 2013-08-19 NOTE — Progress Notes (Signed)
CRITICAL VALUE ALERT  Critical value received:  Na 115  Date of notification:  08/18/2013  Time of notification:  1125  Critical value read back:yes  Nurse who received alert:  Deland PrettyMarissa Leilani Cespedes  MD notified (1st page):  Dr. Lockie Molaoncall for pager  Time of first page:  1125  MD notified (2nd page):  Time of second page:  Responding MD:  [facility]  Time MD responded:  1130

## 2013-08-19 NOTE — Progress Notes (Signed)
Patient ZO:XWRUEAV:[name]      DOB: 02/27/1986      WUJ:[id number]  Presented to the bedside.  Patient drowsy.  Silvano BilisGrandmother [name] at her side.  Patient's mother has left.  [name] awakens but is not able to participate fully in conversation even about code status, which she states could we just hold on that.  Contacted mother [name].  She is willing to come in at 130 pm to talk about goals.  Both mother and daughter aware of tenuous level of [name]'s health and have been told that she is dying. Lucendia HerrlichFaye relates [name] has always said resuscitate her to give her the chance.  She does not want to defy that even if we have to take her off life support.  Total time:  20 min  Goals of care Monday 130 pm   [name] L. Ladona Ridgelaylor, MD MBA The Palliative Medicine Team at [facility] Team Phone: [phone] Pager: [phone]

## 2013-08-19 NOTE — Progress Notes (Signed)
Family Medicine Teaching Service Daily Progress Note Intern Pager: [phone]  Patient name: [name] Medical record number: [id number] Date of birth: 22-Jan-1986 Age: 28 y.o. Gender: female  Primary Care Provider: Levert FeinsteinMcIntyre, Brittany, MD Consultants: None Code Status: Full  Pt Overview and Major Events to Date:  1/8: Hepatic encephalopathy 1/9-10: Hb trend downward 1/11: Hb stable, palliative consult pending but pt has denied / refused in the past  Assessment and Plan: [name] is a 28 y.o. female presenting with confusion, lethargy consistent with hepatic encephalopathy. PMH is significant for autoimmune hepatitis, primary sclerosing cholangitis, substance abuse (cocaine, marijuana), cirrhosis and esophageal and gastric varices.   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 MELD 32 > 35 (52.6%+ 658-month mortality). Very poor prognosis. LFTs elevated. Albumin 1.7, INR 3.65 (previously 2.48). Total bilirubin: 28.9 (previously 16.4) - Continue home meds: Rifaximin; Ursodiol; Cholestyramine; Atarax prn  - Holding diuretics for hypotension: Home doses (Torsemide 40mg  BID, & Spironolactone 200mg  daily ) - Discriminant function 88.7 (based on PT from 1/1): Will benefit from Prednisone 40mg  x 1 month.  - Liver failure continues to progress (MELD Score 35), active GI bleed, INR of 3.6; nothing to add from GI standpoint, see progress note 1/10 from Dr. Clinton SawyerWilliamson - Dr. Ladona Ridgelaylor has seen pt and will leave any palliative recommendations; much appreciated; pt has refused palliative care measures but may be more open to considerations, stating 1/11 to Dr. Casper HarrisonStreet "I  want to be comfortable when the time comes, but I don't know if I'm there yet" - may need to consider discussing pt with CCM as pt remains full code for now and / or requesting palliative care to see pt again, today as pt may continue to decompensate with hypoNa, hypotension, etc  Hyponatremia: Related to liver pathology. S/p 1L bolus in ED, another given 1/10 for hypotension - Na trending down, 119 1/11 - Encouarge PO, fluid restrict  Anemia with GI Bleed:  - Chronic, stable. Normocytic, hypochromic, due to chronic disease. Hgb: 10.8 (admit 1/8) > 9.4 - FOBT positive in setting of INR 3.65 > repeat INR 1/12 - monitor Hb, consider FFP / pRBC if continues to drop - pt stated to Dr. Clinton SawyerWilliamson that she would likely consider Hospice if Hb continues to trend down   Abdominal Pain - pt with intermittent abdominal pain, ?exaggeration of symptoms but some concern for withdrawal as well on 1/11 - increase morphine to 4 mg IV q 2 hrs PRN - D/C Zofran, ordered for Reglan prior to meals (home med, states more effective than Zofran)  Thrombocytopenia: Plt 68, INR 2.48. Will hold pharmacologic VTE prophylaxis and continue to monitor.  - Plt: slightly improved to 74, monitor  Creatinine elevation: AKI due to encephalopathy and poor hydration (supported by BUN:Cr ~ 20:1) and/or hepatorenal syndrome, given long-standing cirrhosis and sequelae.  - FENa: 0.46 but was on diuretics & CMET obtained  4 hrs after urine collection - mild increase to 1.28 from 1.2, monitor, consider albumin if worsened or not improving - need to fluid restrict for hyponatremia  Hypokalemia: improved - Monitor and replace as needed  Hypochloremia:  - Monitoring with hyponatremia  Abnormal ECG: ECG in  ED NSR with IVCD read as LBBB, without ST segment changes, with t-wave concordance. Doubt this is significant in her presentation.  - ECG: Sinus Bradycardia - Trop neg x 1 - continue Telemetry   Hypotension: - Improved, s/p  bolus 1/10, holding diuretics - Holding diuretics, fluid restrict as above, but may need boluses for pressure support - Consider Albumin or stress steroids if worsens  Leukocytosis: improved Afebrile without infection source. WBC 11.0 on admit near baseline. Will monitor.  - May be due to steroids, cont to follow   Seizure disorder: No convulsive activity currently. Continue home keppra 750mg  BID.   FEN/GI: saline lock IV, hepatic diet with fluid restriction Prophylaxis: SCDs, hold pharmacologics  Disposition: management as above; discharge planning uncertain, given continuing issues with BP and sodium  Subjective:  No acute new complaints other than continued abdominal pain and sleeping very poorly / not at all. Poor appetite but no frank vomiting. Zofran does not help nausea, Reglan at home worked better. Pt spoke to Dr. [name] (palliative) yesterday, and is "thinking about things" but not wanting comfort care, yet.  Objective: Temp:  [98 F (36.7 C)-98.6 F (37 C)] 98 F (36.7 C) (01/11 0514) Pulse Rate:  [80-88] 81 (01/11 0514) Resp:  [18] 18 (01/11 0514) BP: (92-119)/(36-45) 112/45 mmHg (01/11 0514) SpO2:  [100 %] 100 % (01/11 0514)  Physical Exam: General: mildly somnolent, ill-appearing 28 yo female lying in bed, markedly jaundiced HEENT: MMM, scleral icteru  Cardiovascular: RRR, blowing systolic murmur  Respiratory: Nonlabored on room air, CTAB, Abdomen: +BS, soft, diffusely tender, +HSM  Extremities: Cool and dry, +2 pulses; no edema; Asterixis present, unchanged Skin: Jaundiced grossly, no wounds / rashes Neuro: interactive, alert & oriented, somnolent but awake / rousable  Laboratory:  Recent Labs Lab 08/17/13 0520 08/18/13 0543 08/19/13 0425  WBC 8.7 14.2* 12.9*  HGB 9.6* 9.5* 9.4*  HCT 26.0* 25.6* 25.0*  PLT 52* 62* 74*    Recent Labs Lab 08/16/13 1709 08/17/13 0520 08/17/13 1432 08/18/13 0543 08/19/13 0425  NA 125* 126* 125* 123* 119*  K 3.5*  3.2* 4.2 4.2 4.7  CL 83* 87* 86* 86* 83*  CO2 32 28 24 25 24   BUN 33* 29* 28* 26* 22  CREATININE 1.30* 1.14* 1.13* 1.20* 1.28*  CALCIUM 9.0 8.3* 8.7 8.5 8.2*  PROT 5.2* 4.7*  --   --   --   BILITOT 28.9* 26.8*  --   --   --   ALKPHOS 241* 229*  --   --   --   ALT 139* 136*  --   --   --   AST 210* 208*  --   --   --   GLUCOSE 118* 120* 180* 143* 182*   Ammonia: 65 (1/8/) Lipase: 18 UDS: Cocaine and Opiate (+)  Imaging/Diagnostic Tests: - none in last 24 hours - see results review for details  Bobbye Morton, MD 08/19/2013, 9:41 AM PGY-2, [facility] FPTS Intern pager: [phone], text pages welcome

## 2013-08-19 NOTE — Progress Notes (Signed)
CRITICAL VALUE ALERT  Critical value received:  Na 118 & total bili 28.3  Date of notification:  08/19/13  Time of notification:  2007  Critical value read back: yes   Nurse who received alert:  Shigeru Lampert [place]  MD notified (1st page):  Dr. Berline Choughigby  Time of first page:  2008  MD notified (2nd page):  Time of second page:  Responding MD:  Dr. Berline Choughigby  Time MD responded:  2009

## 2013-08-20 DIAGNOSIS — Z515 Encounter for palliative care: Secondary | ICD-10-CM

## 2013-08-20 LAB — BASIC METABOLIC PANEL
BUN: 25 mg/dL — ABNORMAL HIGH (ref 6–23)
BUN: 25 mg/dL — ABNORMAL HIGH (ref 6–23)
BUN: 26 mg/dL — ABNORMAL HIGH (ref 6–23)
BUN: 23 mg/dL (ref 6–23)
BUN: 26 mg/dL — ABNORMAL HIGH (ref 6–23)
CALCIUM: 8.2 mg/dL — AB (ref 8.4–10.5)
CALCIUM: 8.5 mg/dL (ref 8.4–10.5)
CHLORIDE: 82 meq/L — AB (ref 96–112)
CHLORIDE: 84 meq/L — AB (ref 96–112)
CO2: 18 meq/L — AB (ref 19–32)
CO2: 19 meq/L (ref 19–32)
CO2: 20 meq/L (ref 19–32)
CO2: 23 mEq/L (ref 19–32)
CO2: 23 meq/L (ref 19–32)
CREATININE: 1.6 mg/dL — AB (ref 0.50–1.10)
CREATININE: 1.56 mg/dL — AB (ref 0.50–1.10)
CREATININE: 1.35 mg/dL — AB (ref 0.50–1.10)
Calcium: 7.9 mg/dL — ABNORMAL LOW (ref 8.4–10.5)
Calcium: 8.2 mg/dL — ABNORMAL LOW (ref 8.4–10.5)
Calcium: 8.3 mg/dL — ABNORMAL LOW (ref 8.4–10.5)
Chloride: 83 mEq/L — ABNORMAL LOW (ref 96–112)
Chloride: 84 mEq/L — ABNORMAL LOW (ref 96–112)
Chloride: 84 mEq/L — ABNORMAL LOW (ref 96–112)
Creatinine, Ser: 1.52 mg/dL — ABNORMAL HIGH (ref 0.50–1.10)
Creatinine, Ser: 1.53 mg/dL — ABNORMAL HIGH (ref 0.50–1.10)
GFR calc Af Amer: 51 mL/min — ABNORMAL LOW (ref >90)
GFR calc Af Amer: 53 mL/min — ABNORMAL LOW (ref >90)
GFR calc non Af Amer: 45 mL/min — ABNORMAL LOW (ref >90)
GFR calc non Af Amer: 46 mL/min — ABNORMAL LOW (ref >90)
GFR calc non Af Amer: 46 mL/min — ABNORMAL LOW (ref >90)
GFR calc non Af Amer: 54 mL/min — ABNORMAL LOW (ref >90)
GFR, EST AFRICAN AMERICAN: 52 mL/min — AB (ref >90)
GFR, EST AFRICAN AMERICAN: 54 mL/min — AB (ref >90)
GFR, EST AFRICAN AMERICAN: 62 mL/min — AB (ref >90)
GFR, EST NON AFRICAN AMERICAN: 44 mL/min — AB (ref >90)
GLUCOSE: 264 mg/dL — AB (ref 70–99)
GLUCOSE: 214 mg/dL — AB (ref 70–99)
Glucose, Bld: 373 mg/dL — ABNORMAL HIGH (ref 70–99)
Glucose, Bld: 358 mg/dL — ABNORMAL HIGH (ref 70–99)
Glucose, Bld: 266 mg/dL — ABNORMAL HIGH (ref 70–99)
POTASSIUM: 5.1 meq/L (ref 3.7–5.3)
POTASSIUM: 4.8 meq/L (ref 3.7–5.3)
Potassium: 5 mEq/L (ref 3.7–5.3)
Potassium: 4.7 mEq/L (ref 3.7–5.3)
Potassium: 4.6 mEq/L (ref 3.7–5.3)
SODIUM: 116 meq/L — AB (ref 137–147)
SODIUM: 117 meq/L — AB (ref 137–147)
Sodium: 116 mEq/L — CL (ref 137–147)
Sodium: 117 mEq/L — CL (ref 137–147)
Sodium: 118 mEq/L — CL (ref 137–147)

## 2013-08-20 LAB — CBC
HCT: 24.9 % — ABNORMAL LOW (ref 36.0–46.0)
Hemoglobin: 9.1 g/dL — ABNORMAL LOW (ref 12.0–15.0)
MCH: 35.1 pg — AB (ref 26.0–34.0)
MCHC: 36.5 g/dL — ABNORMAL HIGH (ref 30.0–36.0)
MCV: 96.1 fL (ref 78.0–100.0)
Platelets: 76 10*3/uL — ABNORMAL LOW (ref 150–400)
RBC: 2.59 MIL/uL — ABNORMAL LOW (ref 3.87–5.11)
RDW: 16.4 % — AB (ref 11.5–15.5)
WBC: 12.5 10*3/uL — ABNORMAL HIGH (ref 4.0–10.5)

## 2013-08-20 LAB — GLUCOSE, CAPILLARY
GLUCOSE-CAPILLARY: 183 mg/dL — AB (ref 70–99)
GLUCOSE-CAPILLARY: 245 mg/dL — AB (ref 70–99)
GLUCOSE-CAPILLARY: 389 mg/dL — AB (ref 70–99)
Glucose-Capillary: 256 mg/dL — ABNORMAL HIGH (ref 70–99)
Glucose-Capillary: 238 mg/dL — ABNORMAL HIGH (ref 70–99)
Glucose-Capillary: 228 mg/dL — ABNORMAL HIGH (ref 70–99)

## 2013-08-20 LAB — PROTIME-INR
INR: 4.5 — ABNORMAL HIGH (ref 0.00–1.49)
Prothrombin Time: 41 seconds — ABNORMAL HIGH (ref 11.6–15.2)

## 2013-08-20 LAB — OSMOLALITY, URINE: Osmolality, Ur: 459 mOsm/kg (ref 390–1090)

## 2013-08-20 MED ORDER — INSULIN ASPART 100 UNIT/ML ~~LOC~~ SOLN
0.0000 [IU] | SUBCUTANEOUS | Status: DC
  Administered 2013-08-20 – 2013-08-21 (×3): 3 [IU] via SUBCUTANEOUS
  Administered 2013-08-21: 9 [IU] via SUBCUTANEOUS
  Administered 2013-08-21: 5 [IU] via SUBCUTANEOUS
  Administered 2013-08-21: 3 [IU] via SUBCUTANEOUS
  Administered 2013-08-21: 7 [IU] via SUBCUTANEOUS
  Administered 2013-08-21: 3 [IU] via SUBCUTANEOUS
  Administered 2013-08-22: 9 [IU] via SUBCUTANEOUS
  Administered 2013-08-22: 3 [IU] via SUBCUTANEOUS
  Administered 2013-08-22: 7 [IU] via SUBCUTANEOUS

## 2013-08-20 MED ORDER — DEXTROSE 5 % IV SOLN
10.0000 mg | Freq: Once | INTRAVENOUS | Status: AC
  Administered 2013-08-20: 10 mg via INTRAVENOUS
  Filled 2013-08-20: qty 1

## 2013-08-20 MED ORDER — INSULIN ASPART 100 UNIT/ML ~~LOC~~ SOLN
0.0000 [IU] | SUBCUTANEOUS | Status: DC
  Administered 2013-08-20: 15 [IU] via SUBCUTANEOUS

## 2013-08-20 NOTE — Progress Notes (Signed)
Patient [name]      DOB: 05-Oct-1985      KDT:[id number]   Met with patient , her grandmother , and her mother Butch Penny.  [name] is intermittently somnolent but when awake consistently tells her story.  She has clearly heard that she is sick enough to die during this hospital stay.  Her mother consistently asked leading questions during the interview, stating that they had been telling her that for many years and yet when she was able to tell me that she understood [name] may have only days to live she stated "I want her to be comfortable " and then had to leave the room. Butch Penny asked me if layken was old enough to make her own decisions be fore she left.  Butch Penny returned at the end of my conversation.  [name] was stating that she desires to have CPR and life support to 'save her' .  We tried to discuss that while we would be able to support her we would not be able to change the fact that her liver would again cause her disease.  [name] is open to the hospice philosphy as far as it would allow her to eat and drink what she wants and that her pain would be treated but she can not reconcile the facts that she will die and that CPR and Life support will not change the status of her liver.  [name] can have hospice services as a full code but she has been informed that they would transport her back to the hospital at her time of death having CPR/ Vent support.  Recommend:  I have no further way to help [name] think through her decisions.  She has stated that she wants to assign dual decision making to her mother and grandmother.  I will ask social worker to complete a medical power of attorney for her.    Full code at the patient' s continued insistence.   135 pm - 220 pm  Greater than 505 of this time was spent counseling and coordinating care.  Tauheed Mcfayden L. Lovena Le, MD MBA The Palliative Medicine Team at [facility] Phone: [phone] Pager: [phone]

## 2013-08-20 NOTE — Progress Notes (Signed)
CRITICAL VALUE ALERT  Critical value received:  Na 117  Date of notification:  08/20/2013  Time of notification:  1355  Critical value read back:yes  Nurse who received alert:  Lance BoschAnna Tremont Gavitt  MD notified (1st page):  Rhunke  Time of first page:1600  MD notified (2nd page):  Time of second page:  Responding MD:  Darleene Cleaverhunke  Time MD responded:  1610

## 2013-08-20 NOTE — Progress Notes (Signed)
Paged Dr. Concerning blood pressure of 94/31-no new orders given.  Lance BoschAnna Chelsea Pedretti, RN

## 2013-08-20 NOTE — Progress Notes (Addendum)
CRITICAL VALUE ALERT  Critical value received: Sodium 116  Date of notification:  08/20/13  Time of notification:  1028  Critical value read back:yes  Nurse who received alert:  Rowe RobertEunice Ansomaa  MD notified (1st page):  Yes  Time of first page: 1040  MD notified (2nd page):N/A  Time of second page:N/A  Responding MD:  Yes  Time MD responded:  N/A

## 2013-08-20 NOTE — Progress Notes (Signed)
Family Medicine Interim Note:   Came to bedside after call from RN stating that BP was 94/31.  Patient feels fine and states that she is thirsty. Her mother is at bedside and asks about possibility of giving Adilee something to drink to make her more comfortable. When asked about her palliative care conversation, she states that she would want to be on life support for 10 days before it were stopped.   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 She has stress dose steroids ordered every 6 hrs. If needed could consider bolusing her, but since BP stable for now, will hold - hyponatremia: hypervolemic hyponatremia from cirrhosis. Currently on a 500cc/day fluid restriction. Patient is increasingly complaining of thirst and wants to be comfortable. Her mother also expresses concern for patient's comfort. Explained that risk of seizure is high with sodium being lower than 120 and that increasing the fluid amount would increase chances of Na dropping. At this point, giving her hypertonic saline is likely not useful. Reviewed the palliative care note and understand that she still wants to be full code. Explained the risks of reducing the fluid restriction and patient expressed understanding. Will go from 500cc to 1L fluid restriction with recheck of BMP at 3-4am tomorrow. At this point, comfort seems to be the main focus. If she seizes, would call CCM, consider hypertonic saline and antiepileptics.  - Dispo: remain on tele floor for now. If BP drops or clinical status changes will transfer her to higher level of care.   Marena ChancyStephanie Micholas Drumwright,  PGY-3 Family Medicine Resident

## 2013-08-20 NOTE — Progress Notes (Signed)
CRITICAL VALUE ALERT  Critical value received:  118  Date of notification:  08/20/13  Time of notification:  0535  Critical value read back: yes  Nurse who received alert:  Adelina MingsKelsey  MD notified (1st page): Berline Choughigby  Time of first page:  [phone]  MD notified (2nd page):  Time of second page:  Responding MD:  Berline Choughigby  Time MD responded:  [phone]

## 2013-08-20 NOTE — Progress Notes (Signed)
Family Medicine Teaching Service Daily Progress Note Intern Pager: [phone]  Patient name: [name] Medical record number: [id number] Date of birth: 09/19/1985 Age: 28 y.o. Gender: female  Primary Care Provider: Levert FeinsteinMcIntyre, Brittany, MD Consultants: None Code Status: Full  Pt Overview and Major Events to Date:  1/8: Hepatic encephalopathy 1/9-10: Hb trend downward 1/11: Hb stable, palliative consult pending but pt has denied / refused in the past 1/12: Palliative to meet with family today. Pt amenable.  Assessment and Plan: [name] is a 28 y.o. female presenting with confusion, lethargy consistent with hepatic encephalopathy. PMH is significant for autoimmune hepatitis, primary sclerosing cholangitis, substance abuse (cocaine, marijuana), cirrhosis and esophageal and gastric varices.   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 MELD 32 > 35 (52.6%+ 6862-month mortality). - Continue home meds: Rifaximin, Ursodiol, Atarax prn; Cholestyramine held due to fluid restriction  - Holding diuretics for hypotension: Home doses (Torsemide 40mg  BID, & Spironolactone 200mg  daily) - Discriminant function 88.7 (based on PT from 1/1): Will benefit from steroids x 1 month.  - Liver failure continues to progress (MELD Score 35), active GI bleed, INR of 3.6; Gi recommends palliative care only. - Palliative care to hold family meeting today. Pt amenable  Hyponatremia: Related to liver pathology. S/p NS bolus x2 - 118 > 116 > 118 - 500cc fluid restriction - Hypertonic saline if seizure or acute mental decompensation  Anemia with GI Bleed: Continued worsening to 9.1 - Chronic, stable.  Normocytic, hypochromic, due to chronic disease. Hgb: 10.8 (admit 1/8) > 9.4 > 9.1 - FOBT positive in setting of INR 3.65 > 4.50 - monitor Hb, consider FFP / pRBC if continues to drop - pt stated to Dr. Clinton SawyerWilliamson that she would likely consider Hospice if Hb continues to trend down   Abdominal Pain - pt with intermittent abdominal pain, ?exaggeration of symptoms but some concern for withdrawal as well on 1/11 - increase morphine to 4 mg IV q 2 hrs PRN - D/C Zofran, ordered for Reglan prior to meals (home med, states more effective than Zofran)  Thrombocytopenia: Will hold pharmacologic VTE prophylaxis and continue to monitor.   Creatinine elevation: AKI due to encephalopathy and poor hydration (supported by BUN:Cr ~ 20:1) and/or hepatorenal syndrome, given long-standing cirrhosis and sequelae.  - Suspect HRS (Cr 1.60 steadily climbing): Consider albumin + lasix - FENa: 0.46 but was on diuretics & CMET obtained 4 hrs after urine collection - need to fluid restrict for hyponatremia  Hypokalemia: Resolved  Hypochloremia:  - Monitoring with hyponatremia  Abnormal ECG: ECG in ED NSR with IVCD read as LBBB, without ST segment changes, with t-wave concordance. Doubt this is significant in her presentation.  - ECG: Sinus Bradycardia - Trop neg x 1 - continue Telemetry   Hypotension: - Improved, s/p bolus 1/10, holding diuretics - Holding diuretics, fluid restrict as above, but may need boluses for pressure support - Consider Albumin or stress steroids if worsens  Leukocytosis: Afebrile without infection source. WBC 11.0 on admit near baseline. Expect increase due to steroids. Monitor for S/Sxs infection  Seizure disorder: No convulsive activity currently. Continue home keppra 750mg  BID.   FEN/GI: saline lock IV, hepatic diet with  fluid restriction Prophylaxis: SCDs, hold pharmacologics  Disposition: management as above; discharge planning uncertain, given continuing issues with BP and  sodium  Subjective:  Pt reports continued abdominal pain. Poor appetite and nauseous. Reports frequently interrupted sleep. No CP/SOB, vomiting, diarrhea, BRBPR. Considering palliative care and ready for meeting today. Wants note for excuse from school because she really wants to finish school.   Objective: Temp:  [97.4 F (36.3 C)-97.9 F (36.6 C)] 97.4 F (36.3 C) (01/12 0601) Pulse Rate:  [78-84] 84 (01/12 0601) Resp:  [16-18] 18 (01/12 0601) BP: (102-113)/(39-45) 102/39 mmHg (01/12 0601) SpO2:  [100 %] 100 % (01/12 0601) Weight:  [180 lb 14.4 oz (82.056 kg)] 180 lb 14.4 oz (82.056 kg) (01/12 0408)  Physical Exam: General: mildly somnolent, ill-appearing 28 yo female lying in bed in NAD HEENT: MMM, large scleral icterus Cardiovascular: RRR, blowing systolic murmur  Respiratory: Nonlabored on room air, CTAB, Abdomen: +BS, soft, diffusely tender, +HSM  Extremities: Cool and dry, +2 pulses; no edema; Asterixis present, unchanged Skin: Jaundiced grossly, no wounds / rashes Neuro: interactive, alert & oriented, somnolent but awake / rousable  Laboratory:  Recent Labs Lab 08/18/13 0543 08/19/13 0425 08/20/13 0340  WBC 14.2* 12.9* 12.5*  HGB 9.5* 9.4* 9.1*  HCT 25.6* 25.0* 24.9*  PLT 62* 74* 76*    Recent Labs Lab 08/16/13 1709 08/17/13 0520  08/19/13 1845 08/19/13 2340 08/20/13 0340  NA 125* 126*  < > 118* 116* 118*  K 3.5* 3.2*  < > 4.9 5.1 5.0  CL 83* 87*  < > 82* 83* 84*  CO2 32 28  < > 23 23 23   BUN 33* 29*  < > 23 23 25*  CREATININE 1.30* 1.14*  < > 1.50* 1.53* 1.60*  CALCIUM 9.0 8.3*  < > 8.5 8.3* 8.2*  PROT 5.2* 4.7*  --  5.0*  --   --   BILITOT 28.9* 26.8*  --  28.3*  --   --   ALKPHOS 241* 229*  --  248*  --   --   ALT 139* 136*  --  145*  --   --   AST 210* 208*  --  131*  --   --   GLUCOSE 118* 120*  < > 182* 264* 214*  < > = values in this interval not displayed. Ammonia: 65 (1/8/) Lipase: 18 UDS: Cocaine and Opiate (+)  Imaging/Diagnostic  Tests: - none in last 24 hours - see results review for details  Hazeline Junker, MD 08/20/2013, 9:24 AM PGY-1, [facility] FPTS Intern pager: [phone], text pages welcome

## 2013-08-20 NOTE — Progress Notes (Signed)
CRITICAL VALUE ALERT  Critical value received:  Na 117  Date of notification:  08/20/2013  Time of notification:  1245  Critical value read back:yes  Nurse who received alert:  Lance BoschAnna Annaleigh Steinmeyer, RN  MD notified (1st page):  Jarvis NewcomerGrunz  Time of first page:  1252  MD notified (2nd page):  Time of second page:  Responding MD:    Time MD responded:

## 2013-08-20 NOTE — Progress Notes (Addendum)
CRITICAL VALUE ALERT  Critical value received:  Na 116  Date of notification:  08/20/13  Time of notification:  0023  Critical value read back: yes  Nurse who received alert:  Amorie Rentz [place]  MD notified (1st page):  Dr. Berline Choughigby  Time of first page:  0024  MD notified (2nd page): Dr. Berline Choughigby  Time of second page: [phone]  Responding MD:  Dr. Berline Choughigby  Time MD responded:  [phone]

## 2013-08-20 NOTE — Progress Notes (Signed)
FMTS Attending Note  I personally saw and evaluated the patient. The plan of care was discussed with the resident team. I agree with the assessment and plan as documented by the resident.   Briefly discussed the results of the Palliative Care meeting with patients mother, [name] wishes to remain full code at this time, Mother is still contemplating Hospice at this time, [name] is intermittently arousable however asleep for most of the conversation  1. End Stage Liver Disease - continue current management as outlined in resident note, at this time patient wishes to continues all supportive measures 2. Hepatic Encephalopathy - continue Lactulose 3. Hyponatremia - secondary to liver disease, continue fluid restriction 4. Elevated INR - due to liver disease, will attempt trial of vitamin K per pharmacy recommendations however low likelihood of INR reversal due to severe liver disease, if patients develops symptomatic bleeding could consider FFP in the short term 5. Anemia with GI bleed - continue to monitor hemoglobin, FFP/PRBC's if acutely worsens 6. Elevated Cr/HRS - exacerbated by fluid restriction due to hyponatremia, continue to monitor, unlikely to improve due to severe liver disease Other conditions stable and agree with plan as outlined  Donnella ShamKyle Prisilla Kocsis MD

## 2013-08-21 DIAGNOSIS — K766 Portal hypertension: Secondary | ICD-10-CM

## 2013-08-21 LAB — GLUCOSE, CAPILLARY
GLUCOSE-CAPILLARY: 210 mg/dL — AB (ref 70–99)
Glucose-Capillary: 207 mg/dL — ABNORMAL HIGH (ref 70–99)
Glucose-Capillary: 213 mg/dL — ABNORMAL HIGH (ref 70–99)
Glucose-Capillary: 286 mg/dL — ABNORMAL HIGH (ref 70–99)
Glucose-Capillary: 323 mg/dL — ABNORMAL HIGH (ref 70–99)
Glucose-Capillary: 343 mg/dL — ABNORMAL HIGH (ref 70–99)
Glucose-Capillary: 361 mg/dL — ABNORMAL HIGH (ref 70–99)

## 2013-08-21 LAB — BASIC METABOLIC PANEL
BUN: 27 mg/dL — ABNORMAL HIGH (ref 6–23)
BUN: 28 mg/dL — AB (ref 6–23)
BUN: 30 mg/dL — ABNORMAL HIGH (ref 6–23)
BUN: 31 mg/dL — ABNORMAL HIGH (ref 6–23)
CHLORIDE: 82 meq/L — AB (ref 96–112)
CO2: 20 mEq/L (ref 19–32)
CO2: 19 mEq/L (ref 19–32)
CO2: 18 mEq/L — ABNORMAL LOW (ref 19–32)
CO2: 18 meq/L — AB (ref 19–32)
CREATININE: 2.01 mg/dL — AB (ref 0.50–1.10)
Calcium: 8.5 mg/dL (ref 8.4–10.5)
Calcium: 8.4 mg/dL (ref 8.4–10.5)
Calcium: 8.3 mg/dL — ABNORMAL LOW (ref 8.4–10.5)
Calcium: 8.3 mg/dL — ABNORMAL LOW (ref 8.4–10.5)
Chloride: 82 mEq/L — ABNORMAL LOW (ref 96–112)
Chloride: 82 mEq/L — ABNORMAL LOW (ref 96–112)
Chloride: 83 mEq/L — ABNORMAL LOW (ref 96–112)
Creatinine, Ser: 1.63 mg/dL — ABNORMAL HIGH (ref 0.50–1.10)
Creatinine, Ser: 1.69 mg/dL — ABNORMAL HIGH (ref 0.50–1.10)
Creatinine, Ser: 1.88 mg/dL — ABNORMAL HIGH (ref 0.50–1.10)
GFR calc Af Amer: 47 mL/min — ABNORMAL LOW (ref >90)
GFR calc Af Amer: 38 mL/min — ABNORMAL LOW (ref >90)
GFR calc non Af Amer: 33 mL/min — ABNORMAL LOW (ref >90)
GFR, EST AFRICAN AMERICAN: 49 mL/min — AB (ref >90)
GFR, EST AFRICAN AMERICAN: 42 mL/min — AB (ref >90)
GFR, EST NON AFRICAN AMERICAN: 43 mL/min — AB (ref >90)
GFR, EST NON AFRICAN AMERICAN: 41 mL/min — AB (ref >90)
GFR, EST NON AFRICAN AMERICAN: 36 mL/min — AB (ref >90)
GLUCOSE: 227 mg/dL — AB (ref 70–99)
Glucose, Bld: 267 mg/dL — ABNORMAL HIGH (ref 70–99)
Glucose, Bld: 320 mg/dL — ABNORMAL HIGH (ref 70–99)
Glucose, Bld: 234 mg/dL — ABNORMAL HIGH (ref 70–99)
POTASSIUM: 4.8 meq/L (ref 3.7–5.3)
POTASSIUM: 5.1 meq/L (ref 3.7–5.3)
Potassium: 5.1 mEq/L (ref 3.7–5.3)
Potassium: 4.8 mEq/L (ref 3.7–5.3)
SODIUM: 114 meq/L — AB (ref 137–147)
SODIUM: 116 meq/L — AB (ref 137–147)
SODIUM: 113 meq/L — AB (ref 137–147)
Sodium: 115 mEq/L — CL (ref 137–147)

## 2013-08-21 LAB — CBC
HCT: 24.5 % — ABNORMAL LOW (ref 36.0–46.0)
HEMOGLOBIN: 9 g/dL — AB (ref 12.0–15.0)
MCH: 36.1 pg — ABNORMAL HIGH (ref 26.0–34.0)
MCHC: 36.7 g/dL — AB (ref 30.0–36.0)
MCV: 98.4 fL (ref 78.0–100.0)
Platelets: 89 10*3/uL — ABNORMAL LOW (ref 150–400)
RBC: 2.49 MIL/uL — ABNORMAL LOW (ref 3.87–5.11)
RDW: 16.5 % — ABNORMAL HIGH (ref 11.5–15.5)
WBC: 15.9 10*3/uL — ABNORMAL HIGH (ref 4.0–10.5)

## 2013-08-21 MED ORDER — SODIUM CHLORIDE 3 % IV SOLN
INTRAVENOUS | Status: DC
  Filled 2013-08-21: qty 500

## 2013-08-21 MED ORDER — SODIUM CHLORIDE 3 % IV SOLN
INTRAVENOUS | Status: DC
  Administered 2013-08-21: 10 mL/h via INTRAVENOUS
  Filled 2013-08-21: qty 500

## 2013-08-21 NOTE — Progress Notes (Signed)
FMTS Attending Note  I personally saw and evaluated the patient. The plan of care was discussed with the resident team. I agree with the assessment and plan as documented by the resident.   Ms. Stipes is drowsy but arousable, asking for pain medication however unable to specify where she is having discomfort, no family members are present at this time  1. ESLD - Patient continues to show decline, palliative care has met with the patient and family and discussed the poor prognosis of her disease, at this time patient wishes to remain full code despite likely futility of intubation, Family Medicine Teaching service will continue to have conversations with the patient and family about their wishes, at this time we are not providing any medical therapy that could not be provided in the patient's own home, we appreciate Palliative Cares help with further discussions of goals of care 2. Hyponatremia - worsening despite fluid restriction, no further medical options are available at this time in the setting of end stage liver disease and hypotension 3. Hepatic encephalopathy - stable on lactulose, patient has waxing and waning levels of lucidness 4. Anemia with GI bleed - hemoglobin has remained stable, continue to monitor 5. Supratherapeutic INR due to liver disease - attempted trial of vitamin K with recheck INR tomorrow, likely futile however given low side effect profile of vitamin K it was decided to attempt 6. 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

## 2013-08-21 NOTE — Progress Notes (Signed)
Chaplain responded to AD referral. Pt said she was really not feeling well and that this was a bad time. She stated that she needed some juice. Chaplain helped her call nurse. Chaplain asked pt to have nurse page spiritual care when she is feeling well enough to complete this document.

## 2013-08-21 NOTE — Progress Notes (Signed)
Family Medicine Teaching Service Daily Progress Note Intern Pager: [phone]  Patient name: [name] Medical record number: [id number] Date of birth: 1986/07/12 Age: 28 y.o. Gender: female  Primary Care Provider: Levert FeinsteinMcIntyre, Brittany, MD Consultants: None Code Status: Full  Pt Overview and Major Events to Date:  1/8: Hepatic encephalopathy 1/9-10: Hb trend downward 1/11: Hb stable, palliative consult pending but pt has denied / refused in the past 1/12: Palliative to meet with family today. Pt amenable.  Assessment and Plan: Terri PonderWhitney Etherington is a 28 y.o. female presenting with confusion, lethargy consistent with hepatic encephalopathy. PMH is significant for autoimmune hepatitis, primary sclerosing cholangitis, substance abuse (cocaine, marijuana), cirrhosis and esophageal and gastric varices.   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 MELD 32 > 35 (52.6%+ 5321-month mortality). - Continue home meds: Rifaximin, Ursodiol, Atarax prn; Cholestyramine held due to fluid restriction  - Holding diuretics for hypotension: Home doses (Torsemide 40mg  BID, & Spironolactone 200mg  daily) - Discriminant function 88.7 (based on PT from 1/1): Will benefit from steroids x 1 month.  - Liver failure continues to progress (MELD Score 35), active GI bleed, INR of 3.6; Gi recommends palliative care only. - Palliative care mtg 1/12  Hyponatremia: Related to liver pathology. S/p NS bolus x2 - 118 > 116 > 118 - 1L fluid restriction - Hypertonic saline if seizure or acute mental decompensation  Anemia with GI Bleed: Stable @ 9.0 - Chronic, stable. Normocytic, hypochromic, due to chronic disease.  Hgb: 10.8 (admit 1/8) > 9.4 > 9.1 - FOBT positive in setting of INR 3.65 > 4.50 - monitor Hb, consider FFP / pRBC if continues to drop  Abdominal Pain - pt with intermittent abdominal pain, ?exaggeration of symptoms but some concern for withdrawal as well on 1/11 - morphine to 4 mg IV q 2 hrs PRN - D/C Zofran, ordered for Reglan prior to meals (home med, states more effective than Zofran)  Thrombocytopenia: Will hold pharmacologic VTE prophylaxis and continue to monitor.   Creatinine elevation: AKI due to encephalopathy and poor hydration (supported by BUN:Cr ~ 20:1) and/or hepatorenal syndrome, given long-standing cirrhosis and sequelae.  - Suspect HRS (Cr 1.60 steadily climbing): Consider albumin + lasix - FENa: 0.46 but was on diuretics & CMET obtained 4 hrs after urine collection - need to fluid restrict for hyponatremia  Hypokalemia: Resolved  Hypochloremia:  - Monitoring with hyponatremia  Abnormal ECG: ECG in ED NSR with IVCD read as LBBB, without ST segment changes, with t-wave concordance. Doubt this is significant in her presentation.  - ECG: Sinus Bradycardia - Trop neg x 1 - continue Telemetry   Hypotension: - Improved, BP normal when checked manual;   - Holding diuretics, fluid restrict ~1L, but may need boluses for pressure support - Consider Albumin or stress steroids if worsens  Leukocytosis: Afebrile without infection source. WBC elevated; Expect increase due to steroids. Monitor for S/Sxs infection  Seizure disorder: No convulsive activity currently. Continue home keppra 750mg  BID.   FEN/GI: saline lock IV, hepatic diet with fluid restriction Prophylaxis: SCDs, hold pharmacologics  Disposition: management as above; discharge planning uncertain, given continuing issues with BP and sodium  Subjective:  Denies pain this morning, but  ask for morphine to help her sleep. Considering Palliative care. Objective: Temp:  [97.1 F (36.2 C)-97.8 F (36.6 C)] 97.7 F  (36.5 C) (01/13 1007) Pulse Rate:  [59-98] 89 (01/13 1007) Resp:  [15-18] 15 (01/13 0615) BP: (88-136)/(31-71) 128/56 mmHg (01/13 1007) SpO2:  [93 %-100 %] 100 % (01/13 1007) Weight:  [180 lb 12.4 oz (82 kg)] 180 lb 12.4 oz (82 kg) (01/13 0615)  Physical Exam: General: mildly somnolent, ill-appearing 28 yo female lying in bed in NAD HEENT: MMM, large scleral icterus Cardiovascular: RRR, blowing systolic murmur  Respiratory: Nonlabored on room air, CTAB, Abdomen: +BS, soft, diffusely tender, +HSM  Extremities: Cool and dry, +2 pulses; no edema; Asterixis present, unchanged Skin: Jaundiced grossly, no wounds / rashes Neuro: interactive, alert & oriented, somnolent but awake / rousable  Laboratory:  Recent Labs Lab 08/19/13 0425 08/20/13 0340 08/21/13 0328  WBC 12.9* 12.5* 15.9*  HGB 9.4* 9.1* 9.0*  HCT 25.0* 24.9* 24.5*  PLT 74* 76* 89*    Recent Labs Lab 08/16/13 1709 08/17/13 0520  08/19/13 1845  08/21/13 0200 08/21/13 0328 08/21/13 0945  NA 125* 126*  < > 118*  < > 114* 115* 116*  K 3.5* 3.2*  < > 4.9  < > 4.8 5.1 5.1  CL 83* 87*  < > 82*  < > 82* 82* 83*  CO2 32 28  < > 23  < > 20 19 18*  BUN 33* 29*  < > 23  < > 27* 28* 30*  CREATININE 1.30* 1.14*  < > 1.50*  < > 1.63* 1.69* 1.88*  CALCIUM 9.0 8.3*  < > 8.5  < > 8.5 8.4 8.3*  PROT 5.2* 4.7*  --  5.0*  --   --   --   --   BILITOT 28.9* 26.8*  --  28.3*  --   --   --   --   ALKPHOS 241* 229*  --  248*  --   --   --   --   ALT 139* 136*  --  145*  --   --   --   --   AST 210* 208*  --  131*  --   --   --   --   GLUCOSE 118* 120*  < > 182*  < > 267* 227* 320*  < > = values in this interval not displayed. Ammonia: 65 (1/8/) Lipase: 18 UDS: Cocaine and Opiate (+)  Imaging/Diagnostic Tests: - none in last 24 hours - see results review for details  Wenda Low, MD 08/21/2013, 12:07 PM PGY-1, [facility] Family [facility] FPTS Intern pager: [phone], text pages welcome

## 2013-08-21 NOTE — Progress Notes (Signed)
CSW received a consult for Advance Directives. CSW contacted [facility] for assistance.    CSW met with Pt concerning referral.  Pt stated that she has gone over Advance Directives several times and knows somewhat what she would like. Pt stated that she would like Jacquelynn Cree Laretta Alstrom  Cell: [phone]  Hm: [phone]) to make her medical decisions for care. CSW informed Pt that [facility] will be coming to speak with her and give her the paperwork to complete and discuss with her Jacquelynn Cree.   CSW to follow along with Pt for any additional needs.     [facility]  [address];  [phone] Phone: [phone]

## 2013-08-21 NOTE — Progress Notes (Signed)
CRITICAL VALUE ALERT  Critical value received:  Na 114  Date of notification:  08/21/2013  Time of notification:  0245  Critical value read back:yes  Nurse who received alert:  E. Kristain Filo  MD notified (1st page):  Dr Gayla DossJoyner  Time of first page:  0250  MD notified (2nd page):  Time of second page:  Responding MD:  Dr Gayla DossJoyner  Time MD responded:  [phone]

## 2013-08-21 NOTE — Discharge Instructions (Signed)
Try to limit fluid intake to 1 liter or less a day.

## 2013-08-21 NOTE — Progress Notes (Signed)
3% NS stopped per verbal order, spoke with MD and asked for written order to D/C telemetry as well. Report given to oncoming RN.

## 2013-08-21 NOTE — Ethics Note (Signed)
Ethics Note:  Responded to a consult request with department leadership regarding patient's care plan and concern about best interest of the patient. Patient is described as having a significant decline since last admission.  Patient continues to want to have " everything done " including being coded and wanting to be on Vent. For 10 days to see if she can recover."  Concern expressed that patient is assuming a benefit from being coded that would not exist. This seems to be based in her perception that what happened with her grandfather could also happen for her ( he was on a vent for 10 days and recovered.)  -   Discussed concerns regarding how to incorporate further conversation with medical team and with patient and surrogates about what is possible and what is not possible to achieve patient's goals. Palliative Care has been involved as reflected in Progress Notes.   Discussed patient case with Dr. Leveda AnnaHensel. This patient is under care of Family Medicine. Dr. Leveda AnnaHensel who is a provider will also make himself available to assist in exploring options in working with patient, her goals and her best interest. In agreement with Dr. Leveda AnnaHensel that an Ethics Consultation with Patient and family would probably not be beneficial at this point an that further work with patient and family by medical team should occur first.   Will be available for further consultation as needed.  Kathlyn SacramentoHamilton, Monnie Gudgel E Ethics Consult [phone].

## 2013-08-21 NOTE — Progress Notes (Signed)
Overnight, pt Na dropped to 114 and DBP in the 30s.  Pt had palliative consult yesterday and wishes to remain full code.  Also, pt on telemetry, not for neurological reasons and therefor not appropriate for the unit.   RR RN paged, situation discussed, and agrees that pt needs higher level of care with such declines in status.  MD paged, these issues will be addressed during rounds, which are approximately within an hour.  Charge nurse aware as well as house coverage.  Will monitor closely.

## 2013-08-21 NOTE — Progress Notes (Signed)
Chaplain received referral from [facility] to follow-up with pt regarding Advanced Directive and end of life discussions. Pt was asleep and said she did not want to talk right now. Will follow up.   Guy SandiferHillary D Hill [place] Heights[place], [place]plain [phone]

## 2013-08-21 NOTE — Progress Notes (Signed)
Inpatient Diabetes Program Recommendations  AACE/ADA: New Consensus Statement on Inpatient Glycemic Control (2013)  Target Ranges:  Prepandial:   less than 140 mg/dL      Peak postprandial:   less than 180 mg/dL (1-2 hours)      Critically ill patients:  140 - 180 mg/dL  Results for [name], [name] (MRN [id number]) as of 08/21/2013 10:56  Ref. Range 08/20/2013 16:12 08/20/2013 20:50 08/21/2013 00:36 08/21/2013 03:54 08/21/2013 08:40  Glucose-Capillary Latest Range: 70-99 mg/dL 409238 (H) 811228 (H) 914213 (H) 210 (H) 323 (H)   Inpatient Diabetes Program Recommendations Insulin - Basal: consider adding basal insulin during steroid therapy Thank you  Piedad ClimesGina Casimer Russett BSN, RN,CDE Inpatient Diabetes Coordinator [phone] (team pager)

## 2013-08-21 NOTE — Progress Notes (Signed)
Family Medicine Teaching Service PCP Social Visit  [facility] visit with her and her grandmother, Lucendia HerrlichFaye. I have been following along daily from a distance regarding Zilla's clinical progress and latest labs. Unfortunately she has worsened during this hospitalization and is likely nearing the end of her life. Charla was sleeping soundly when I entered, so I spent the visit supporting Lucendia HerrlichFaye and answered her questions. Lucendia HerrlichFaye seems to understand where this is headed, although we did not speak in specific terms as Alphonzo LemmingsWhitney was resting so soundly. Teralyn did stir a little and briefly said hello, then fell back asleep. She appeared comfortable. Lucendia HerrlichFaye has noticed Nga seeming weaker today.  With [name]'s permission, I have placed a call to Dr. Woodfin GanjaJama Darling, who has been Estrellita[facility] primary [facility] many [facility] and who knows her well, to update her on Fynley's condition.  I greatly appreciate the excellent care Alphonzo LemmingsWhitney has been provided by the family medicine and palliative care teams. Please let me know if there is anything I can do to be supportive of Joniqua and her family.  Levert FeinsteinBrittany Jakhai Fant, MD Family Medicine PGY-2

## 2013-08-21 NOTE — Progress Notes (Addendum)
CRITICAL VALUE ALERT  Critical value received:  Na 116  Date of notification:  08/21/13  Time of notification:  1058  Critical value read back:yes  Nurse who received alert:  Sullivan LoneJamie White  MD notified (1st page):  Family Medicine Resident  Time of first page:  1059  MD notified (2nd page):      Time of second page:  Responding MD:  FMTS  Time MD responded:  1101

## 2013-08-22 ENCOUNTER — Inpatient Hospital Stay: Payer: Medicare Other | Admitting: Family Medicine

## 2013-08-22 DIAGNOSIS — E7220 Disorder of urea cycle metabolism, unspecified: Secondary | ICD-10-CM

## 2013-08-22 LAB — CBC
HEMATOCRIT: 23.8 % — AB (ref 36.0–46.0)
Hemoglobin: 8.8 g/dL — ABNORMAL LOW (ref 12.0–15.0)
MCH: 35.6 pg — ABNORMAL HIGH (ref 26.0–34.0)
MCHC: 37 g/dL — AB (ref 30.0–36.0)
MCV: 96.4 fL (ref 78.0–100.0)
Platelets: 61 10*3/uL — ABNORMAL LOW (ref 150–400)
RBC: 2.47 MIL/uL — ABNORMAL LOW (ref 3.87–5.11)
RDW: 16.5 % — AB (ref 11.5–15.5)
WBC: 13.6 10*3/uL — AB (ref 4.0–10.5)

## 2013-08-22 LAB — GLUCOSE, CAPILLARY
GLUCOSE-CAPILLARY: 328 mg/dL — AB (ref 70–99)
Glucose-Capillary: 83 mg/dL (ref 70–99)
Glucose-Capillary: 230 mg/dL — ABNORMAL HIGH (ref 70–99)

## 2013-08-22 LAB — PROTIME-INR
INR: 5.54 (ref 0.00–1.49)
Prothrombin Time: 48 seconds — ABNORMAL HIGH (ref 11.6–15.2)

## 2013-08-22 LAB — BASIC METABOLIC PANEL
BUN: 35 mg/dL — ABNORMAL HIGH (ref 6–23)
CHLORIDE: 80 meq/L — AB (ref 96–112)
CO2: 18 mEq/L — ABNORMAL LOW (ref 19–32)
Calcium: 8.7 mg/dL (ref 8.4–10.5)
Creatinine, Ser: 2.3 mg/dL — ABNORMAL HIGH (ref 0.50–1.10)
GFR calc non Af Amer: 28 mL/min — ABNORMAL LOW (ref >90)
GFR, EST AFRICAN AMERICAN: 33 mL/min — AB (ref >90)
Glucose, Bld: 117 mg/dL — ABNORMAL HIGH (ref 70–99)
Potassium: 5.7 mEq/L — ABNORMAL HIGH (ref 3.7–5.3)
Sodium: 112 mEq/L — CL (ref 137–147)

## 2013-08-22 NOTE — Progress Notes (Signed)
CRITICAL VALUE ALERT  Critical value received:  112 Sodium  Date of notification:  08/22/13  Time of notification:  0741  Critical value read back:yes  Nurse who received alert:  Serena ColonelYeni Pineda, RN  MD notified (1st page):Family Practice   Time of first page:  [phone]

## 2013-08-22 NOTE — Progress Notes (Signed)
CRITICAL VALUE ALERT  Critical value received:  5.54 INR  Date of notification:  08/22/13  Time of notification:  0749  Critical value read back:yes  Nurse who received alert:  Serena ColonelYeni Pineda, RN  MD notified (1st page):  Inpatient Hospitalized  Time of first page:  [phone]

## 2013-08-22 NOTE — Progress Notes (Signed)
Paged MD and notified regarding critical lab values.  No orders given.  Will continue to monitor patient.  Call bell placed within reach.

## 2013-08-22 NOTE — Discharge Summary (Signed)
Family Medicine Teaching [facility] Summary  Patient name: Terri PonderWhitney Rojas Medical record number: [id number] Date of birth: 12-Jun-1986 Age: 28 y.o. Gender: female Date of Admission: 08/16/2013  Date of Discharge: 08/22/13 Admitting Physician: Tobey GrimJeffrey H Walden, MD  Primary Care Provider: Levert FeinsteinMcIntyre, Brittany, MD Consultants: Palliative Care  Indication for Hospitalization: Hepatic Encephalopathy   Discharge Diagnoses/Problem List:  1. Liver Failure 2. Hepatic encephalopathy 3. Hyponatremia 4. Anemia 5. Thrombocytopenia  6. AKI 7. Seizure  Disposition: Home Hospice  Discharge Condition: Stable  Brief Hospital Course: Terri PonderWhitney Soja is a 28 y.o. female who presented with confusion, lethargy consistent with hepatic encephalopathy. PMH is significant for autoimmune hepatitis, primary sclerosing cholangitis, substance abuse (cocaine, marijuana), cirrhosis and esophageal and gastric varices. On admission, UDS was positive for Cocaine, and likely contributing to encephalopathy. MELD score calculated as 32. Encephalopathy improved with Lactulose and Rifaximin. Her diuretics were held due to hypotension and AKI. Her chronic hyponatremia worsened, and was refractory to fluid restriction and hypertonic saline. Patient expressed desire to liberate fluid restriction; medical risk and prognosis were discussed and Palliative care consulted. [name] decided to go home with hospice.   Issues for Follow Up:   Pain control and symptom management  Significant Procedures: None  Significant Labs and Imaging:   Recent Labs Lab 08/20/13 0340 08/21/13 0328 08/22/13 0640  WBC 12.5* 15.9* 13.6*  HGB 9.1* 9.0* 8.8*  HCT 24.9* 24.5* 23.8*  PLT 76* 89* 61*    Recent Labs Lab 08/16/13 1709 08/17/13 0520  08/19/13 1845  08/21/13 0200 08/21/13 0328 08/21/13 0945 08/21/13 1835 08/22/13 0640  NA 125* 126*  < > 118*  < > 114* 115* 116* 113* 112*  K 3.5* 3.2*  < > 4.9  < > 4.8 5.1 5.1 4.8  5.7*  CL 83* 87*  < > 82*  < > 82* 82* 83* 82* 80*  CO2 32 28  < > 23  < > 20 19 18* 18* 18*  GLUCOSE 118* 120*  < > 182*  < > 267* 227* 320* 234* 117*  BUN 33* 29*  < > 23  < > 27* 28* 30* 31* 35*  CREATININE 1.30* 1.14*  < > 1.50*  < > 1.63* 1.69* 1.88* 2.01* 2.30*  CALCIUM 9.0 8.3*  < > 8.5  < > 8.5 8.4 8.3* 8.3* 8.7  MG  --  2.0  --   --   --   --   --   --   --   --   ALKPHOS 241* 229*  --  248*  --   --   --   --   --   --   AST 210* 208*  --  131*  --   --   --   --   --   --   ALT 139* 136*  --  145*  --   --   --   --   --   --   ALBUMIN 1.7* 1.5*  --  1.7*  --   --   --   --   --   --   < > = values in this interval not displayed.   Recent Labs Lab 08/17/13 0520 08/19/13 1845 08/20/13 0340 08/22/13 0640  INR 3.65* 4.06* 4.50* 5.54*   Results/Tests Pending at Time of Discharge: None  Discharge Medications:    Medication List    STOP taking these medications       acetaminophen 500 MG tablet  Commonly  known as:  TYLENOL     desloratadine 5 MG tablet  Commonly known as:  CLARINEX     folic acid 1 MG tablet  Commonly known as:  FOLVITE     nadolol 20 MG tablet  Commonly known as:  CORGARD     spironolactone 100 MG tablet  Commonly known as:  ALDACTONE     torsemide 20 MG tablet  Commonly known as:  DEMADEX      TAKE these medications       acidophilus Caps capsule  Take 2 capsules by mouth daily.     cholestyramine 4 G packet  Commonly known as:  QUESTRAN  Take 1 packet (4 g total) by mouth every 12 (twelve) hours.     EPINEPHrine 0.3 mg/0.3 mL Devi  Commonly known as:  EPI-PEN  Inject 0.3 mg into the muscle once as needed (severe allergic reaction).     hydrOXYzine 25 MG tablet  Commonly known as:  ATARAX/VISTARIL  Take 50 mg by mouth daily as needed for itching.     lactulose 10 GM/15ML solution  Commonly known as:  CHRONULAC  Take 30 mLs (20 g total) by mouth 2 (two) times daily as needed (to produce 2-3 bowel movements daily).      levETIRAcetam 750 MG tablet  Commonly known as:  KEPPRA  Take 750 mg by mouth every 12 (twelve) hours.     metoCLOPramide 10 MG tablet  Commonly known as:  REGLAN  Take 1 tablet (10 mg total) by mouth 3 (three) times daily before meals.     morphine CONCENTRATE 10 mg / 0.5 ml concentrated solution  Take 0.25 mLs (5 mg total) by mouth every 4 (four) hours as needed for severe pain.     ondansetron 4 MG disintegrating tablet  Commonly known as:  ZOFRAN-ODT  Take 4 mg by mouth every 4 (four) hours as needed for nausea or vomiting.     pantoprazole 40 MG tablet  Commonly known as:  PROTONIX  Take 40 mg by mouth 2 (two) times daily.     predniSONE 20 MG tablet  Commonly known as:  DELTASONE  Take 2 tablets (40 mg) for the next 4 days.  Then take 20 mg daily until follow up with PCP.     promethazine 25 MG suppository  Commonly known as:  PHENERGAN  Place 0.5 suppositories (12.5 mg total) rectally every 6 (six) hours as needed for nausea or vomiting.     promethazine 25 MG tablet  Commonly known as:  PHENERGAN  Take 0.5 tablets (12.5 mg total) by mouth every 6 (six) hours as needed for nausea.     rifaximin 550 MG Tabs tablet  Commonly known as:  XIFAXAN  Take 1 tablet (550 mg total) by mouth 2 (two) times daily.     ursodiol 300 MG capsule  Commonly known as:  ACTIGALL  Take 1 capsule (300 mg total) by mouth 2 (two) times daily.        Discharge Instructions: Please refer to Patient Instructions section of EMR for full details.  Patient was counseled important signs and symptoms that should prompt return to medical care, changes in medications, dietary instructions, activity restrictions, and follow up appointments.   Follow-Up Appointments: Follow-up Information   Call Levert Feinstein, MD. (As needed)    Specialty:  Family Medicine   Contact information:   [address] [place] [address] [phone]       Wenda Low, MD 08/23/2013, 12:16  AM PGY-1, [place]

## 2013-08-22 NOTE — Progress Notes (Signed)
NUTRITION FOLLOW UP  Intervention:   Encourage PO intake No further interventions warranted at this time  Nutrition Dx:   Predicted suboptimal energy intake related to decreased appetite PTA as evidenced by family report, current NPO status, and varied weights; ongoing -varied PO 0%-100%  Goal:   Pt to meet >/= 90% of their estimated nutrition needs; not met  Monitor:   Diet advancement; diet advanced to hepatic 1/9 PO intake; varies, 0%, 50%, 100% Weights; 19 lb weight gain since 1/8 Labs; blood glucose ranging 83 to 361 mg/dL, low hemoglobin, very low sodium, high potassium, high BUN, high creatinine, low GFR  Assessment:   Pt confused and lethargic at time of visit. Pt states she has been eating but, unable to describe her appetite or how much she is eating. Pt states she is not interested in receiving any snacks or nutritional supplements.   Per MD note, pt is likely nearing end of life. Pt had palliative meeting 1/12- "full code at the patient's continued insistence". Per MD note, palliative to revisit [place] today.   Height: Ht Readings from Last 1 Encounters:  08/16/13 5' 2.5" (1.588 m)    Weight Status:   Wt Readings from Last 1 Encounters:  08/22/13 182 lb 5.1 oz (82.7 kg)    Re-estimated needs:  Kcal: 1800-2000  Protein: 60-70 grams  Fluid: 2.3 L/day  Skin: +1 generalized edema, +2 RLE edema, +1 LLE edema; intact  Diet Order: Renal   Intake/Output Summary (Last 24 hours) at 08/22/13 1222 Last data filed at 08/22/13 0800  Gross per 24 hour  Intake    540 ml  Output      0 ml  Net    540 ml    Last BM: 1/14   Labs:   Recent Labs Lab 08/17/13 0520  08/21/13 0945 08/21/13 1835 08/22/13 0640  NA 126*  < > 116* 113* 112*  K 3.2*  < > 5.1 4.8 5.7*  CL 87*  < > 83* 82* 80*  CO2 28  < > 18* 18* 18*  BUN 29*  < > 30* 31* 35*  CREATININE 1.14*  < > 1.88* 2.01* 2.30*  CALCIUM 8.3*  < > 8.3* 8.3* 8.7  MG 2.0  --   --   --   --   GLUCOSE 120*  < > 320*  234* 117*  < > = values in this interval not displayed.  CBG (last 3)   Recent Labs  08/21/13 2350 08/22/13 0350 08/22/13 0815  GLUCAP 207* 83 230*    Scheduled Meds: . folic acid  1 mg Oral Daily  . hydrocortisone sod succinate (SOLU-CORTEF) inj  50 mg Intravenous Q6H  . insulin aspart  0-9 Units Subcutaneous Q4H  . lactulose  20 g Oral QID  . levETIRAcetam  750 mg Oral Q12H  . metoCLOPramide  10 mg Oral TID AC   Or  . metoCLOPramide (REGLAN) injection  10 mg Intravenous TID AC  . pantoprazole  40 mg Oral BID  . rifaximin  550 mg Oral BID  . sodium chloride  3 mL Intravenous Q12H  . ursodiol  300 mg Oral BID    Continuous Infusions:   Pryor Ochoa RD, LDN Inpatient Clinical Dietitian Pager: [phone] After Hours Pager: [phone]

## 2013-08-22 NOTE — Progress Notes (Signed)
Family Medicine Teaching Service Daily Progress Note Intern Pager: [phone]  Patient name: Francia Verry Medical record number: [id number] Date of birth: 06/29/1986 Age: 28 y.o. Gender: female  Primary Care Provider: Levert Feinstein, MD Consultants: None Code Status: Full  Pt Overview and Major Events to Date:  1/8: Hepatic encephalopathy 1/9-10: Hb trend downward 1/11: Hb stable, palliative consult pending but pt has denied / refused in the past 1/12: Palliative to meet with family today. Pt amenable. 1/14: Palliative to revisit [facility] today  Assessment and Plan: Laneah Luft is a 28 y.o. female presenting with confusion, lethargy consistent with hepatic encephalopathy. PMH is significant for autoimmune hepatitis, primary sclerosing cholangitis, substance abuse (cocaine, marijuana), cirrhosis and esophageal and gastric varices.   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 MELD 32 > 35 (52.6%+ 9-month mortality). - Continue home meds: Rifaximin, Ursodiol, Atarax prn; Cholestyramine held due to fluid restriction  - Holding diuretics for hypotension: Home doses (Torsemide 40mg  BID, & Spironolactone 200mg  daily) - Discriminant function 88.7 (based on PT from 1/1): Will benefit from steroids x 1 month.  - Liver failure continues to progress (MELD Score 35), active GI bleed, INR of 3.6; Gi recommends palliative care only. - Palliative care to revisit goals of care today  Hyponatremia: Related to liver pathology. S/p NS bolus x2 - 118 > 116 > 112 - 1L fluid restriction - Hypertonic saline if seizure or acute mental decompensation  Anemia with GI Bleed: Stable @ 8.8 -  Chronic, stable. Normocytic, hypochromic, due to chronic disease. Hgb: 10.8 (admit 1/8) > 9.4 > 9.1 - FOBT positive in setting of INR 3.65 > 4.50 - monitor Hb, consider FFP / pRBC if continues to drop  Abdominal Pain - pt with intermittent abdominal pain, ?exaggeration of symptoms but some concern for withdrawal as well on 1/11 - morphine to 4 mg IV q 2 hrs PRN - D/C Zofran, ordered for Reglan prior to meals (home med, states more effective than Zofran)  Thrombocytopenia: Will hold pharmacologic VTE prophylaxis and continue to monitor.   Creatinine elevation: AKI due to encephalopathy and poor hydration (supported by BUN:Cr ~ 20:1) and/or hepatorenal syndrome, given long-standing cirrhosis and sequelae.  - Suspect HRS (Cr 1.60 steadily climbing): Consider albumin + lasix - FENa: 0.46 but was on diuretics & CMET obtained 4 hrs after urine collection - need to fluid restrict for hyponatremia  Hypokalemia: Resolved  Hypochloremia:  - Monitoring with hyponatremia  Abnormal ECG: ECG in ED NSR with IVCD read as LBBB, without ST segment changes, with t-wave concordance. Doubt this is significant in her presentation.  - ECG: Sinus Bradycardia - Trop neg x 1 - continue Telemetry   Hypotension: - Improved, BP normal when checked manual;   - Holding diuretics, fluid restrict ~1L, but may need boluses for pressure support - Consider Albumin or stress steroids if worsens  Leukocytosis: Afebrile without infection source. WBC elevated; Expect increase due to steroids. Monitor for S/Sxs infection  Seizure disorder: No convulsive activity currently. Continue home keppra 750mg  BID.   FEN/GI: saline lock IV, hepatic diet with fluid restriction Prophylaxis: SCDs, hold pharmacologics  Disposition: management as above; discharge planning uncertain, given continuing issues with BP  and sodium  Subjective:  She is happy that her sister is bringing her Jello. Considering Palliative  care. Objective: Temp:  [97.3 F (36.3 C)-98.1 F (36.7 C)] 97.3 F (36.3 C) (01/14 0900) Pulse Rate:  [83-104] 88 (01/14 0900) Resp:  [18] 18 (01/14 0900) BP: (93-120)/(30-63) 102/30 mmHg (01/14 0900) SpO2:  [99 %-100 %] 100 % (01/14 0900) Weight:  [182 lb 5.1 oz (82.7 kg)] 182 lb 5.1 oz (82.7 kg) (01/14 0435)  Physical Exam: General: mildly somnolent, ill-appearing 28 yo female lying in bed in NAD HEENT: MMM, large scleral icterus Cardiovascular: RRR, blowing systolic murmur  Respiratory: Nonlabored on room air, CTAB, Abdomen: +BS, soft, diffusely tender, +HSM  Extremities: Cool and dry, +2 pulses; no edema; Asterixis present, unchanged Skin: Jaundiced grossly, no wounds / rashes Neuro: interactive, alert & oriented, somnolent but awake / rousable  Laboratory:  Recent Labs Lab 08/20/13 0340 08/21/13 0328 08/22/13 0640  WBC 12.5* 15.9* 13.6*  HGB 9.1* 9.0* 8.8*  HCT 24.9* 24.5* 23.8*  PLT 76* 89* 61*    Recent Labs Lab 08/16/13 1709 08/17/13 0520  08/19/13 1845  08/21/13 0945 08/21/13 1835 08/22/13 0640  NA 125* 126*  < > 118*  < > 116* 113* 112*  K 3.5* 3.2*  < > 4.9  < > 5.1 4.8 5.7*  CL 83* 87*  < > 82*  < > 83* 82* 80*  CO2 32 28  < > 23  < > 18* 18* 18*  BUN 33* 29*  < > 23  < > 30* 31* 35*  CREATININE 1.30* 1.14*  < > 1.50*  < > 1.88* 2.01* 2.30*  CALCIUM 9.0 8.3*  < > 8.5  < > 8.3* 8.3* 8.7  PROT 5.2* 4.7*  --  5.0*  --   --   --   --   BILITOT 28.9* 26.8*  --  28.3*  --   --   --   --   ALKPHOS 241* 229*  --  248*  --   --   --   --   ALT 139* 136*  --  145*  --   --   --   --   AST 210* 208*  --  131*  --   --   --   --   GLUCOSE 118* 120*  < > 182*  < > 320* 234* 117*  < > = values in this interval not displayed. Ammonia: 65 (1/8/) Lipase: 18 UDS: Cocaine and Opiate (+)  Imaging/Diagnostic Tests: - none in last 24 hours - see results review for details  Wenda LowJames Ahjanae Cassel, MD 08/22/2013, 12:13 PM PGY-1, [facility] FPTS Intern  pager: [phone], text pages welcome

## 2013-08-22 NOTE — Progress Notes (Signed)
Terri BilisGrandmother, [name], called requesting to speak with palliative. Cell: [phone] Home: [phone]  [name], MD, PGY-1 08/22/2013 10:49 AM

## 2013-08-22 NOTE — Care Management Note (Unsigned)
    Page 1 of 1   08/22/2013     3:44:53 PM   CARE MANAGEMENT NOTE 08/22/2013  Patient:  [name],[name]   Account Number:  [id number]  Date Initiated:  08/20/2013  Documentation initiated by:  Lorne Skeens  Subjective/Objective Assessment:   Patient was admitted with hepatic encephalopathy. Lives at home with grandmother     Action/Plan:   will follow for discharge needs   Anticipated DC Date:  08/22/2013   Anticipated DC Plan:  HOME W HOSPICE CARE  In-house referral  Ethics Consult  Hospice / [place]  CM consult      Choice offered to / List presented to:  C-2 HC POA / [place]   Status of service:  Completed, signed off Medicare Important Message given?   (If response is "NO", the following Medicare IM given date fields will be blank) Date Medicare IM given:   Date Additional Medicare IM given:    Discharge Disposition:  [place]  Per UR Regulation:    If discussed at Long Length of Stay Meetings, dates discussed:    Comments:  08/22/13 [place], MSN, CM- Met with patient and grandmother Laretta Alstrom to discuss home hospice.  Ms Cherlynn June has chosen to use Hospice and [facility].  Vickie with [facility] was notified of referral. Contact information for Ms Cherlynn June was provided.  Patient will be transported by family via private vehicle.

## 2013-08-22 NOTE — Progress Notes (Signed)
Family Medicine Teaching Service Service Pager: [phone]  Terri PonderWhitney Rojas 28 y.o. female  MRN: [id number]  DOB: 01-Sep-1985   Primary Care Provider:   Levert FeinsteinMcIntyre, Brittany, MD  Consultants:  Palliative Care   CODE STATUS: Full Code - addressed again today 08/22/13  Principal Problem:   Multi-organ failure with liver failure Active Problems:   Encephalopathy, hepatic   Autoimmune hepatitis   Abdominal pain, chronic, right upper quadrant   Cocaine abuse   Primary sclerosing cholangitis   Elevated LFTs   Hyperbilirubinemia   Renal insufficiency  Goals of care discussion with myself, Dr. Randolm IdolFletke, [name] and [name] was held today at 2:00.  We discussed that currently there are no medical treatments we are able to provide her in the hospital that she cannot be receiving at home.  We discussed her ongoing desire to be full code and expressed our concern that although this is what she desires, that in no way can we promise her that she would be a candidate to be maintained on life support for 10 days.  [name] was receptive to this and they both expressed understanding that even with heroic measures there is a high likelihood she would pass acutely inspite of aggresive treatments being provided including chest compressions, intubation, vasopressors.    The discussion led into what options we currently have to provide her the best medical care possible and they were both receptive to having home hospice help provide them this care.  [name] expressed wishes to be able to eat and drink and to spend time with friends and family.  She reported continued abdominal pain multiple times throughout the discussion and understands that comfort measures would likely hasten her underlying terminal medical condition; she expressed some understanding of this although is still encephalopathic due to her Primary Sclerosing Cholangitis/Autoimmune Hepatitis.  We discussed the option of residential hospice  at this time what he desires to go home but her grandmother does have concerns as to whether or not she will receive care she needs.  --- DISPOSITION: Will contact out of care/hospice regarding establishing home hospice and continue the discussion regarding the best care for [name].  She does remain full code at this time but is becoming more receptive to understanding that although this is her desire the outcome is likely not what she would wish for and seems to be better comprehending that heroic measures are likely futile at this point.  Case Management consulted and Palliative Care team updated via telephone message.   Andrena MewsMichael D Yobani Schertzer, DO [facility] Resident - PGY-3 08/22/2013 3:06 PM

## 2013-08-22 NOTE — Progress Notes (Signed)
Inpatient Diabetes Program Recommendations  AACE/ADA: New Consensus Statement on Inpatient Glycemic Control (2013)  Target Ranges:  Prepandial:   less than 140 mg/dL      Peak postprandial:   less than 180 mg/dL (1-2 hours)      Critically ill patients:  140 - 180 mg/dL   Results for [name], [name] (MRN [id number]) as of 08/22/2013 13:48  Ref. Range 08/21/2013 08:40 08/21/2013 11:38 08/21/2013 16:12 08/21/2013 19:46 08/21/2013 23:50 08/22/2013 03:50 08/22/2013 08:15  Glucose-Capillary Latest Range: 70-99 mg/dL 413323 (H) 244343 (H) 010286 (H) 361 (H) 207 (H) 83 230 (H)    Inpatient Diabetes Program Recommendations Insulin - Basal: Please consider ordering low dose basal insulin if steroids are going to be continued.  Note: In reviewing the chart, noted that patient did not receive any Novolog correction this morning for blood glucose of 230 mg/dl.  Called unit and spoke with [name], RN and she reports that the glucose meter had not been docked and she mistakenly thought the 8am blood sugar was 83 mg/dl (which was actually the blood glucose  From 3:50 am) and the patient did not receive any Novolog correction.  [name], RN reports that noon time blood glucose was 328 mg/dl and patient received Novolog 7 units for correction.  Also noted patient and family are to re-meet with palliative today.  If steroids are to be continued, please consider ordering low dose basal insulin.  Will continue to follow.  Thanks, [name], RN, MSN, CCRN Diabetes Coordinator Inpatient Diabetes Program [phone] (Team Pager) [phone] (AP office) [phone] [facility] office)

## 2013-08-22 NOTE — Progress Notes (Signed)
Dr. Reina Fuseunz paged regarding discontinuing her tele-he said he would discuss it in rounds.  I explained that our floor typically only accepts tele patients that are neuro or trauma related.  Lance BoschAnna Sinaya [place], RN

## 2013-08-22 NOTE — Progress Notes (Signed)
Pt. DC'd home via car with grandmother.  Hospice appointment was set up for tomorrow morning at 1000.  DC instructions given to grandmother.

## 2013-08-22 NOTE — Progress Notes (Signed)
FMTS Attending Note  I personally saw and evaluated the patient. The plan of care was discussed with the resident team. I agree with the assessment and plan as documented by the resident.   Met with [name] (grandmother), and Dr. Paulla Rojas to discuss goals of care. Please refer to the note that Dr. Paulla Rojas has documented from the encounter.   [name] has decided to pursue home hospice. The Palliative care team will be contacted to help make arrangements.   Terri Arbour MD

## 2013-08-23 ENCOUNTER — Telehealth: Payer: Self-pay | Admitting: *Deleted

## 2013-08-23 LAB — GLUCOSE, CAPILLARY: Glucose-Capillary: 392 mg/dL — ABNORMAL HIGH (ref 70–99)

## 2013-08-23 NOTE — Telephone Encounter (Addendum)
Message left by Hospice RN on MD/RX line regarding wanting to discontinue some meds.  Will need name of meds Hospice wants to discontinue.  [facility] [facility] [facility] and left message to call our office back.  Gaylene Brooksichardson, Jeannette Ann, RN

## 2013-08-23 NOTE — Discharge Summary (Signed)
I agree with the discharge summary as documented.   Mattheus Rauls MD  

## 2013-08-24 ENCOUNTER — Telehealth: Payer: Self-pay | Admitting: Family Medicine

## 2013-08-24 ENCOUNTER — Encounter: Payer: Self-pay | Admitting: Family Medicine

## 2013-08-27 NOTE — Telephone Encounter (Signed)
Hospice called to let Dr. Pollie MeyerMcintyre know that [name] passed on Mar 20, 2014. Terri Jacobsonjw

## 2013-08-29 ENCOUNTER — Telehealth: Payer: Self-pay | Admitting: Family Medicine

## 2013-08-29 NOTE — Telephone Encounter (Signed)
Called Genasis's grandmother, Lucendia HerrlichFaye, and offered her my condolences, and let her know that [facility] is thinking about her and all of Kinzy's family. She says she has support and is doing all right, given the situation.  Latrelle DodrillBrittany J Shawni Volkov, MD

## 2013-09-09 NOTE — Progress Notes (Signed)
Patient died at 2:02am on 17-Feb-2014 at home under hospice care. Death certificate completed.

## 2013-09-09 NOTE — Telephone Encounter (Signed)
After hours line  Call received from the nurse from hospice. She states that [name] is actively dying and is still a full code. Grandmother requests a DNR but she needs a doctor's order to let it happen. She is also requesting verbal orders for increased morphine and a benzodiazepine for comfort.   She is offering I verbally give the orders and her physician will sign them in the am.   I explained that I am very familiar with the patient and her unfortunate situation. I agree with the recommended treatments but do not normally write orders for hospice care. She stated that she has an on-call hospice doctor who could also give these orders and that she was happy to call him.   Given that she has ready access to a physician on call for her practice I will defer these orders to him due to my unfamiliarity of their process. These orders are appropriate in the given context but certainly have large repercussions, otherwise of course I would prefer to make [name] as comfortable as possible throughout the dying process.   I thanked her for the communication and stated I would pass along the information to the physicians involved in her care.   Murtis SinkSam Giulianna Rocha, MD [facility] Resident, PGY-2 2014/04/26, 1:15 AM

## 2013-09-09 DEATH — deceased

## 2013-11-06 ENCOUNTER — Other Ambulatory Visit: Payer: Medicare Other

## 2013-11-06 ENCOUNTER — Ambulatory Visit: Payer: Medicare Other | Admitting: Oncology

## 2014-06-17 IMAGING — CT CT HEAD W/O CM
2 series · 16 of 30 positions shown, 20 images · non-contrast
Comparison: Head CT scan 08/08/2009 and brain MRI 08/09/2009.

CLINICAL DATA: Nausea and vomiting.

CT HEAD WITHOUT CONTRAST
TECHNIQUE: Contiguous axial images were obtained from the base of
the skull through the vertex without contrast.

[Series 2: head w/o · axial · non-contrast · 0.43mm/px · z∈[-94,+26]mm · 13 of 29 slices shown, 17 images]
[im 3/29  brain]
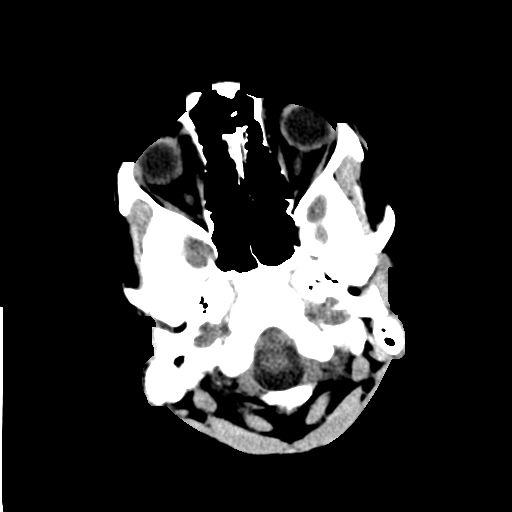
[im 3/29  bone]
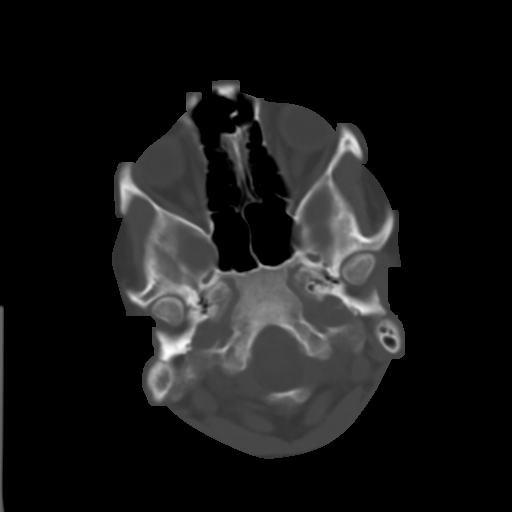
[im 5/29  brain]
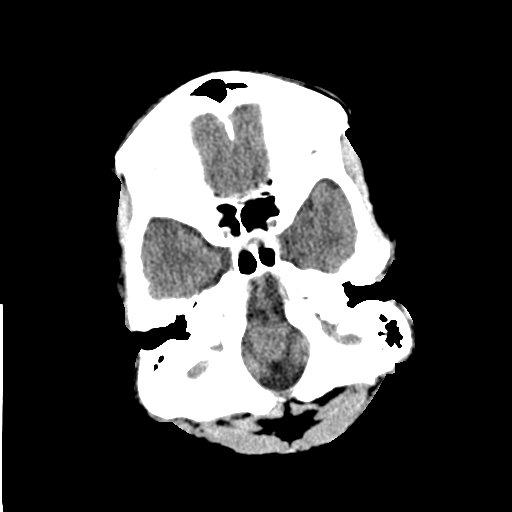
[im 7/29  brain]
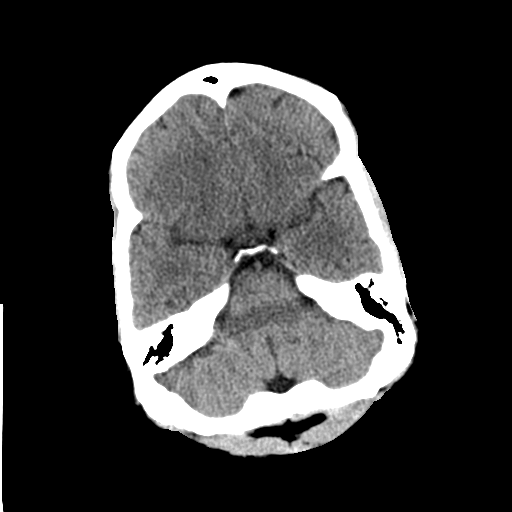
[im 9/29  brain]
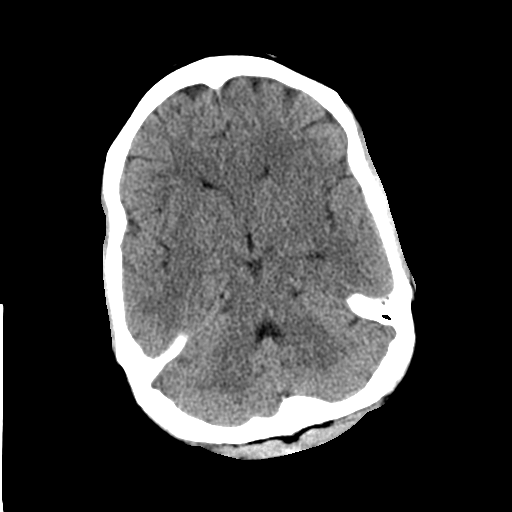
[im 11/29  brain]
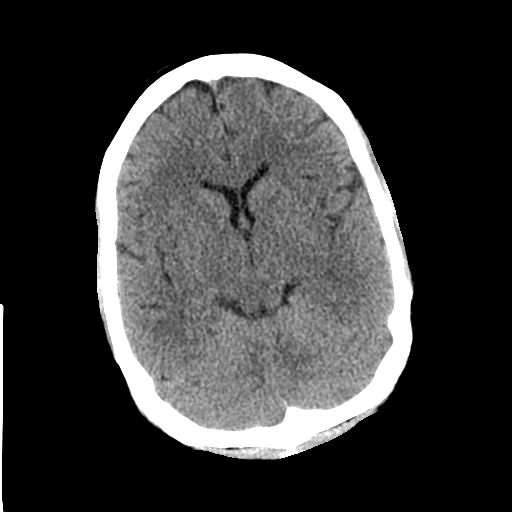
[im 11/29  bone]
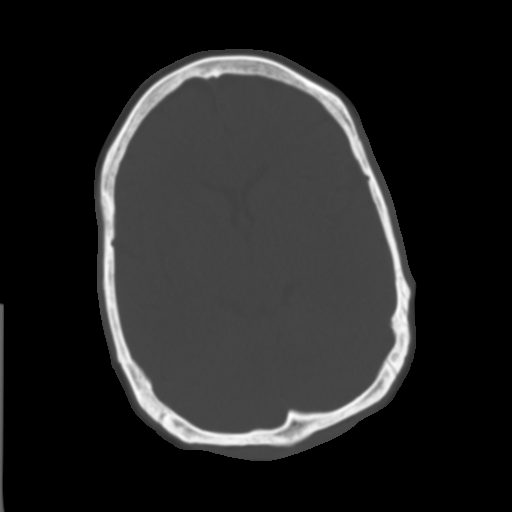
[im 13/29  brain]
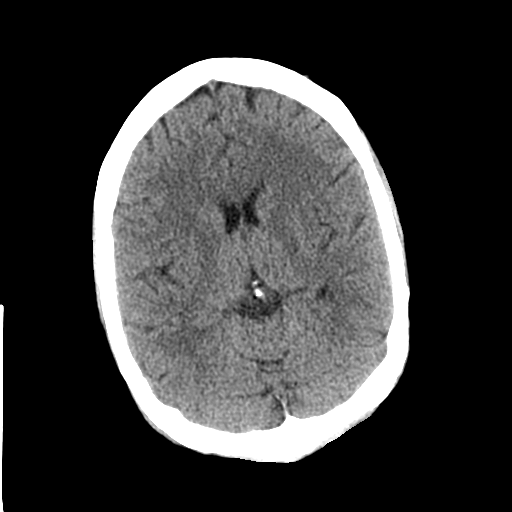
[im 15/29  brain]
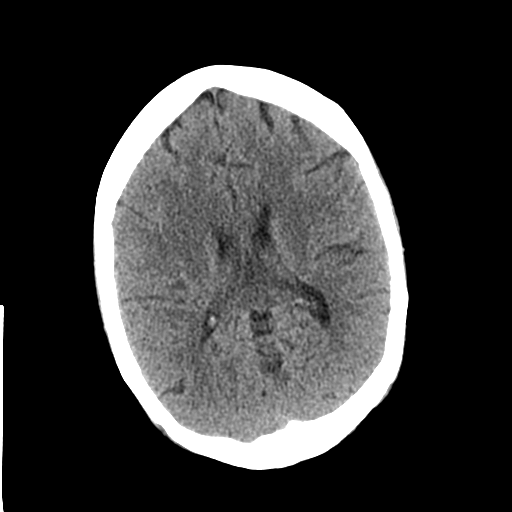
[im 17/29  brain]
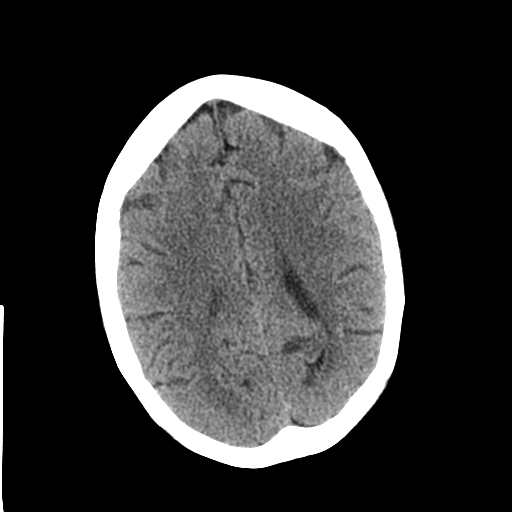
[im 19/29  brain]
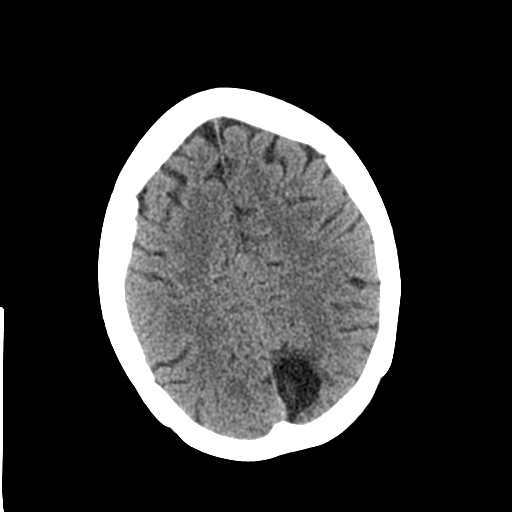
[im 19/29  bone]
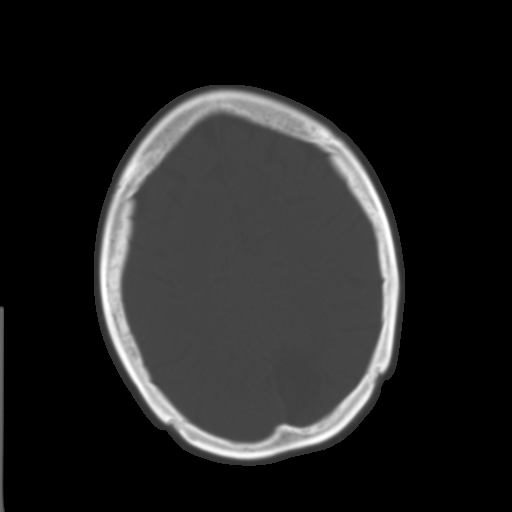
[im 21/29  brain]
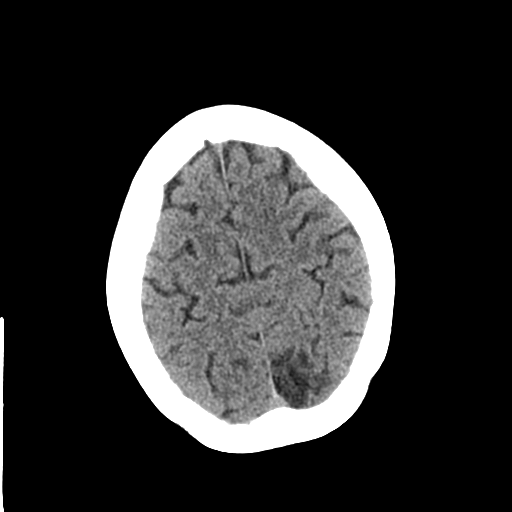
[im 23/29  brain]
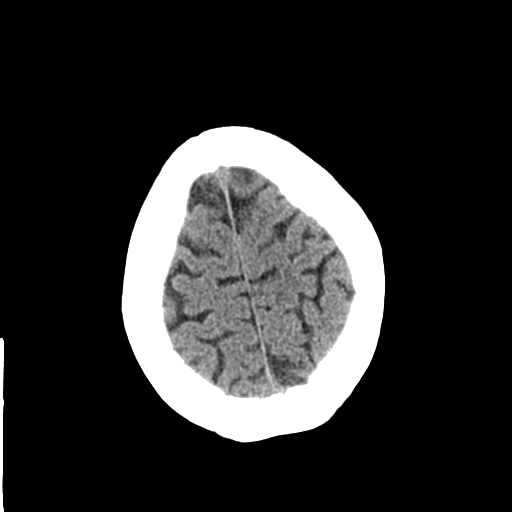
[im 25/29  brain]
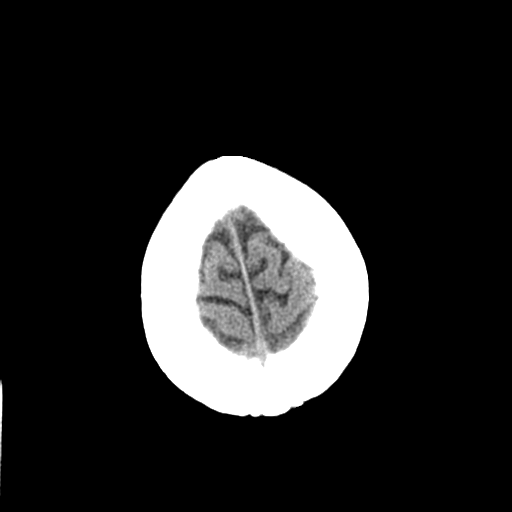
[im 27/29  brain]
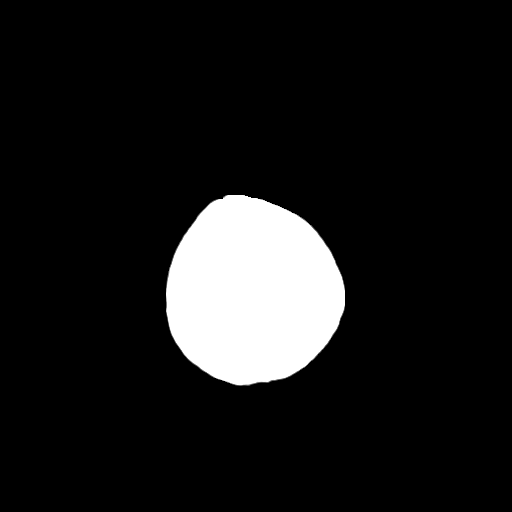
[im 27/29  bone]
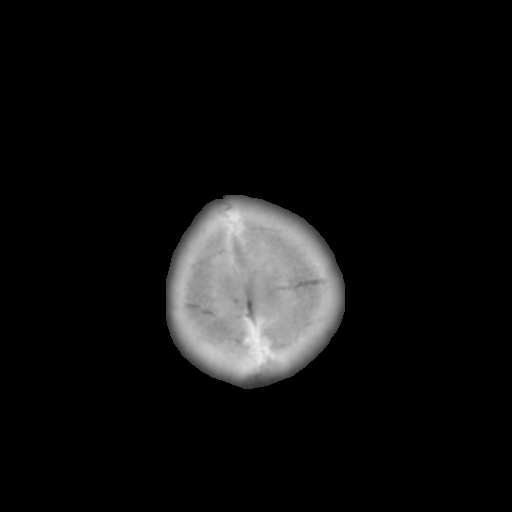

[Series 3: bone windows · axial · 0.43mm/px · z∈[-94,-54]mm · 3 of 29 slices shown]
[im 3/29  bone]
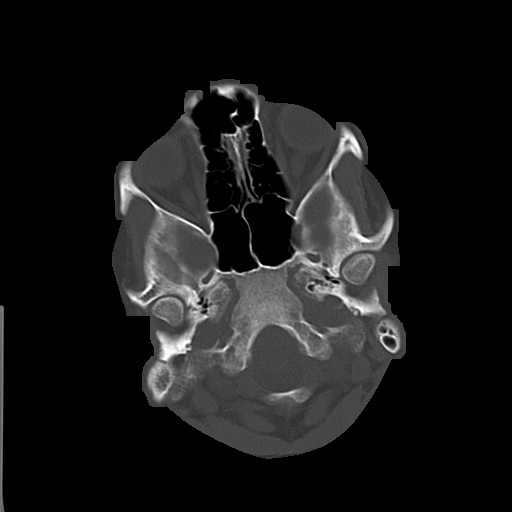
[im 7/29  bone]
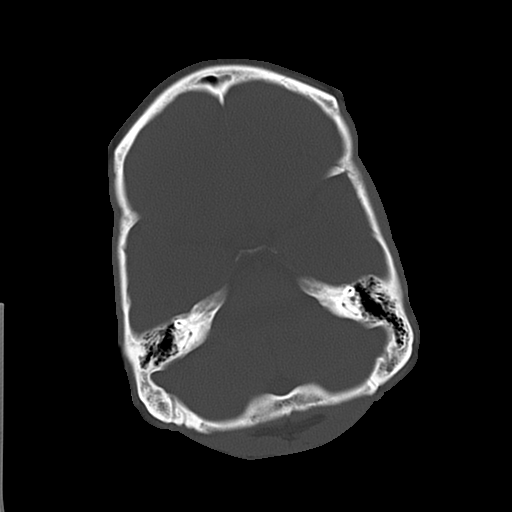
[im 11/29  bone]
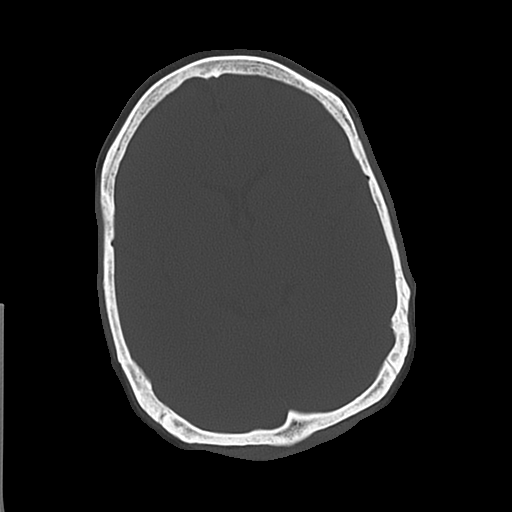

[16 of 30 positions shown; findings below may reference images not displayed]

FINDINGS: Encephalomalacia in the left parietal lobe is again seen.
No evidence of acute abnormality including infarction, hemorrhage,
mass lesion, mass effect, midline shift or abnormal extra-axial
fluid collection is identified.  There is no hydrocephalus or
pneumocephalus.  The calvarium is intact.
IMPRESSION: No acute finding.  Stable compared to prior exam.
